# Patient Record
Sex: Female | Born: 1945
Health system: Southern US, Community
[De-identification: ages and names within clinical notes are randomized; demographics above are authoritative.]

## PROBLEM LIST (undated history)

## (undated) DIAGNOSIS — K5790 Diverticulosis of intestine, part unspecified, without perforation or abscess without bleeding: Secondary | ICD-10-CM

## (undated) DIAGNOSIS — E785 Hyperlipidemia, unspecified: Secondary | ICD-10-CM

## (undated) DIAGNOSIS — R0600 Dyspnea, unspecified: Secondary | ICD-10-CM

## (undated) DIAGNOSIS — K219 Gastro-esophageal reflux disease without esophagitis: Secondary | ICD-10-CM

## (undated) DIAGNOSIS — I10 Essential (primary) hypertension: Secondary | ICD-10-CM

## (undated) DIAGNOSIS — T7840XA Allergy, unspecified, initial encounter: Secondary | ICD-10-CM

## (undated) DIAGNOSIS — H269 Unspecified cataract: Secondary | ICD-10-CM

## (undated) DIAGNOSIS — H409 Unspecified glaucoma: Secondary | ICD-10-CM

## (undated) DIAGNOSIS — R7303 Prediabetes: Secondary | ICD-10-CM

## (undated) DIAGNOSIS — M1990 Unspecified osteoarthritis, unspecified site: Secondary | ICD-10-CM

## (undated) HISTORY — DX: Hyperlipidemia, unspecified: E78.5

## (undated) HISTORY — DX: Diverticulosis of intestine, part unspecified, without perforation or abscess without bleeding: K57.90

## (undated) HISTORY — DX: Unspecified cataract: H26.9

## (undated) HISTORY — DX: Unspecified glaucoma: H40.9

## (undated) HISTORY — DX: Allergy, unspecified, initial encounter: T78.40XA

## (undated) HISTORY — DX: Essential (primary) hypertension: I10

## (undated) HISTORY — PX: JOINT REPLACEMENT: SHX530

---

## 1978-04-26 HISTORY — PX: CHOLECYSTECTOMY: SHX55

## 1988-04-26 HISTORY — PX: ABDOMINAL HYSTERECTOMY: SHX81

## 1998-04-26 HISTORY — PX: OTHER SURGICAL HISTORY: SHX169

## 2007-09-02 ENCOUNTER — Encounter: Payer: Self-pay | Admitting: Family Medicine

## 2009-04-03 ENCOUNTER — Ambulatory Visit: Payer: Self-pay | Admitting: Family Medicine

## 2009-04-03 DIAGNOSIS — E785 Hyperlipidemia, unspecified: Secondary | ICD-10-CM

## 2009-04-03 DIAGNOSIS — I10 Essential (primary) hypertension: Secondary | ICD-10-CM

## 2009-04-07 ENCOUNTER — Encounter: Payer: Self-pay | Admitting: Family Medicine

## 2009-04-07 ENCOUNTER — Ambulatory Visit (HOSPITAL_COMMUNITY): Admission: RE | Admit: 2009-04-07 | Discharge: 2009-04-07 | Payer: Self-pay | Admitting: Family Medicine

## 2009-04-14 ENCOUNTER — Ambulatory Visit: Payer: Self-pay | Admitting: Family Medicine

## 2009-05-09 ENCOUNTER — Encounter: Payer: Self-pay | Admitting: Gastroenterology

## 2009-05-14 ENCOUNTER — Encounter: Payer: Self-pay | Admitting: Family Medicine

## 2009-05-14 LAB — CONVERTED CEMR LAB
BUN: 14 mg/dL (ref 6–23)
CO2: 25 meq/L (ref 19–32)
Chloride: 104 meq/L (ref 96–112)
Cholesterol: 275 mg/dL — ABNORMAL HIGH (ref 0–200)
HDL: 111 mg/dL (ref >39)
Potassium: 3.6 meq/L (ref 3.5–5.3)
Sodium: 140 meq/L (ref 135–145)
Triglycerides: 58 mg/dL (ref <150)

## 2009-05-15 ENCOUNTER — Ambulatory Visit: Payer: Self-pay | Admitting: Family Medicine

## 2009-05-19 ENCOUNTER — Telehealth: Payer: Self-pay | Admitting: Family Medicine

## 2009-05-27 DIAGNOSIS — K5790 Diverticulosis of intestine, part unspecified, without perforation or abscess without bleeding: Secondary | ICD-10-CM

## 2009-05-27 HISTORY — DX: Diverticulosis of intestine, part unspecified, without perforation or abscess without bleeding: K57.90

## 2009-05-27 HISTORY — PX: COLONOSCOPY: SHX174

## 2009-06-02 ENCOUNTER — Ambulatory Visit: Payer: Self-pay | Admitting: Gastroenterology

## 2009-06-02 ENCOUNTER — Ambulatory Visit (HOSPITAL_COMMUNITY): Admission: RE | Admit: 2009-06-02 | Discharge: 2009-06-02 | Payer: Self-pay | Admitting: Gastroenterology

## 2009-06-03 ENCOUNTER — Telehealth: Payer: Self-pay | Admitting: Family Medicine

## 2009-06-04 ENCOUNTER — Encounter: Payer: Self-pay | Admitting: Family Medicine

## 2009-06-12 ENCOUNTER — Ambulatory Visit: Payer: Self-pay | Admitting: Family Medicine

## 2009-07-10 ENCOUNTER — Ambulatory Visit: Payer: Self-pay | Admitting: Family Medicine

## 2009-08-20 ENCOUNTER — Ambulatory Visit (HOSPITAL_COMMUNITY): Admission: RE | Admit: 2009-08-20 | Discharge: 2009-08-20 | Payer: Self-pay | Admitting: Family Medicine

## 2010-01-01 ENCOUNTER — Ambulatory Visit: Payer: Self-pay | Admitting: Family Medicine

## 2010-01-01 DIAGNOSIS — R079 Chest pain, unspecified: Secondary | ICD-10-CM

## 2010-05-06 ENCOUNTER — Ambulatory Visit
Admission: RE | Admit: 2010-05-06 | Discharge: 2010-05-06 | Payer: Self-pay | Source: Home / Self Care | Attending: Family Medicine | Admitting: Family Medicine

## 2010-05-06 DIAGNOSIS — M25569 Pain in unspecified knee: Secondary | ICD-10-CM | POA: Insufficient documentation

## 2010-05-11 ENCOUNTER — Encounter
Admission: RE | Admit: 2010-05-11 | Discharge: 2010-05-11 | Payer: Self-pay | Source: Home / Self Care | Attending: Family Medicine | Admitting: Family Medicine

## 2010-05-17 ENCOUNTER — Encounter: Payer: Self-pay | Admitting: Family Medicine

## 2010-05-26 NOTE — Letter (Signed)
Summary: internal other/triage  internal other/triage   Imported By: Cloria Spring LPN 16/01/9603 54:09:81  _____________________________________________________________________  External Attachment:    Type:   Image     Comment:   External Document

## 2010-05-26 NOTE — Medication Information (Signed)
Summary: Tax adviser   Imported By: Lind Guest 06/04/2009 10:25:20  _____________________________________________________________________  External Attachment:    Type:   Image     Comment:   External Document

## 2010-05-26 NOTE — Progress Notes (Signed)
Summary: RESULTS  Phone Note Call from Patient   Summary of Call: WANTS TO KNOW ABOUT HER BONE Achille Rich BACK AT [phone] Initial call taken by: Lind Guest,  May 19, 2009 4:44 PM  Follow-up for Phone Call        patient aware Follow-up by: Worthy Keeler LPN,  May 19, 2009 5:27 PM

## 2010-05-26 NOTE — Assessment & Plan Note (Signed)
Summary: office visit   Vital Signs:  Patient profile:   65 year old female Height:      66 inches Weight:      143.25 pounds BMI:     23.20 O2 Sat:      98 % Pulse rate:   72 / minute Pulse rhythm:   regular Resp:     16 per minute BP sitting:   178 / 96 Cuff size:   regular  Vitals Entered By: Everitt Amber (May 15, 2009 2:35 PM) CC: Follow up chronic problems   Primary Care Provider:  Syliva Overman MD  CC:  Follow up chronic problems.  History of Present Illness: Reports  that she has been  doing fairly well. Denies recent fever or chills. Denies sinus pressure, nasal congestion , ear pain or sore throat. Denies chest congestion, or cough productive of sputum. Denies chest pain, palpitations, PND, orthopnea or leg swelling. Denies abdominal pain, nausea, vomitting, diarrhea or constipation. Denies change in bowel movements or bloody stool. Denies dysuria , frequency, incontinence or hesitancy. Denies  joint pain, swelling, or reduced mobility. Denies headaches, vertigo, seizures. Reports a significant amt of stress caring for her elderly mother, and living displaced [place]. her sleep is poor,listening out for mother.' she is not exercising as she used to, and her diet has changed, all of these things she feels has contributed to her BP being uncontrolled   Denies  rash, lesions, or itch.     Current Medications (verified): 1)  Calcium-Magnesium-Zinc 333-133-8.3 Mg Tabs (Calcium-Magnesium-Zinc) .... Three A Day 2)  Multivitamins  Tabs (Multiple Vitamin) .... 1/2 in The Am and Half At Night 3)  Fish Oil 1400 .... Take 1 Tablet By Mouth Once A Day 4)  Biotin 5000 .... Take 1 Tablet By Mouth Once A Day 5)  Coenzyme Q10 10 Mg Caps (Coenzyme Q10) .... Take 1 Tablet By Mouth Once A Day 6)  B Complex  Tabs (B Complex Vitamins) .... Take 1 Tablet By Mouth Once A Day 7)  Diovan 320 Mg Tabs (Valsartan) .... One Tab By Mouth Qd 8)   Hydrochlorothiazide 12.5 Mg Caps (Hydrochlorothiazide) .... One Cap By Mouth Qd  Allergies (verified): No Known Drug Allergies  Review of Systems      See HPI Eyes:  Denies blurring and discharge. Neuro:  Denies headaches, seizures, and sensation of room spinning. Endo:  Denies cold intolerance, excessive hunger, excessive thirst, excessive urination, heat intolerance, polyuria, and weight change. Heme:  Denies abnormal bruising and bleeding.  Physical Exam  General:  Well-developed,well-nourished,in no acute distress; alert,appropriate and cooperative throughout examination HEENT: No facial asymmetry,  EOMI, No sinus tenderness, TM's Clear, oropharynx  pink and moist.   Chest: Clear to auscultation bilaterally.  CVS: S1, S2, No murmurs, No S3.   Abd: Soft, Nontender.  MS: Adequate ROM spine, hips, shoulders and knees.  Ext: No edema.   CNS: CN 2-12 intact, power tone and sensation normal throughout.   Skin: Intact, no visible lesions or rashes.  Psych: Good eye contact, normal affect.  Memory intact, not anxious or depressed appearing.    Impression & Recommendations:  Problem # 1:  DYSLIPIDEMIA (ICD-272.4) Assessment Comment Only    HDL:111 (05/14/2009)  LDL:152 (05/14/2009)  Chol:275 (05/14/2009)  Trig:58 (05/14/2009), ratio of total to hdl chol is good will d/w card whether pt should be on meds based on absolute values being high  Problem # 2:  HYPERTENSION (ICD-401.9) Assessment: Deteriorated  Her updated medication list for this problem includes:    Diovan Hct 320-25 Mg Tabs (Valsartan-hydrochlorothiazide) .Marland Kitchen... Take 1 tablet by mouth once a day    Hydrochlorothiazide 12.5 Mg Caps (Hydrochlorothiazide) ..... One cap by mouth qd    Amlodipine Besylate 2.5 Mg Tabs (Amlodipine besylate) .Marland Kitchen... Take 1 tablet by mouth once a day alot of time spent discussing stress and lifestylechanges, to help lower bP, pt isds visibly ditressed about her seeming lack of response to  doubling med dose BP today: 178/96 Prior BP: 160/90 (04/14/2009)  Labs Reviewed: K+: 3.6 (05/14/2009) Creat: : 0.76 (05/14/2009)   Chol: 275 (05/14/2009)   HDL: 111 (05/14/2009)   LDL: 152 (05/14/2009)   TG: 58 (05/14/2009)  Complete Medication List: 1)  Calcium-magnesium-zinc 333-133-8.3 Mg Tabs (Calcium-magnesium-zinc) .... Three a day 2)  Multivitamins Tabs (Multiple vitamin) .... 1/2 in the am and half at night 3)  Fish Oil 1400  .... Take 1 tablet by mouth once a day 4)  Biotin 5000  .... Take 1 tablet by mouth once a day 5)  Coenzyme Q10 10 Mg Caps (Coenzyme q10) .... Take 1 tablet by mouth once a day 6)  B Complex Tabs (B complex vitamins) .... Take 1 tablet by mouth once a day 7)  Diovan Hct 320-25 Mg Tabs (Valsartan-hydrochlorothiazide) .... Take 1 tablet by mouth once a day 8)  Hydrochlorothiazide 12.5 Mg Caps (Hydrochlorothiazide) .... One cap by mouth qd 9)  Amlodipine Besylate 2.5 Mg Tabs (Amlodipine besylate) .... Take 1 tablet by mouth once a day  Patient Instructions: 1)  Nurse BP checkx  in 4 weeks. 2)  MD f/u iin 8 weeks. 3)  New dose of med diovan/hCTYZ  320/25 and you will have an additional  med   added amlodipine 2.5 mg one  bedtime.       Prescriptions: AMLODIPINE BESYLATE 2.5 MG TABS (AMLODIPINE BESYLATE) Take 1 tablet by mouth once a day  #30 x 3   Entered by:   Worthy Keeler LPN   Authorized by:   Syliva Overman MD   Signed by:   Syliva Overman MD on 05/26/2009   Method used:   Handwritten   RxID:   [id number] DIOVAN HCT 320-25 MG TABS (VALSARTAN-HYDROCHLOROTHIAZIDE) Take 1 tablet by mouth once a day  #30 x 3   Entered by:   Lind Guest   Authorized by:   Syliva Overman MD   Signed by:   Syliva Overman MD on 05/26/2009   Method used:   Handwritten   RxID:   [id number]

## 2010-05-26 NOTE — Assessment & Plan Note (Signed)
Summary: office visit   Vital Signs:  Patient profile:   65 year old female Menstrual status:  hysterectomy Height:      66 inches Weight:      145.50 pounds BMI:     23.57 O2 Sat:      98 % Pulse rate:   67 / minute Pulse rhythm:   regular Resp:     16 per minute BP sitting:   120 / 80  (left arm) Cuff size:   regular  Vitals Entered By: Everitt Amber LPN (January 01, 2010 1:02 PM) CC: Follow up chronic problems   Primary Care Provider:  Syliva Overman MD  CC:  Follow up chronic problems.  History of Present Illness: Reports  that she has been doing as well as to be expected. She unfortunately unexpectedly recently lost her mother , and is trying to heal emotionally. She wonders if there was "something she missed" to cause her mom's death, I assure her that there is nothing she could have done/should have done differently. Denies recent fever or chills. Denies sinus pressure, nasal congestion , ear pain or sore throat. Denies chest congestion, or cough productive of sputum. Denies chest pain, palpitations, PND, orthopnea or leg swelling. She does have marked hyperlipidemia, she has no symptoms to suggest CAD, her mother was dx with CAD over the age of 14 and lived beyond the age of 26, and died woth bowel ischemia. Denies abdominal pain, nausea, vomitting, diarrhea or constipation. Denies change in bowel movements or bloody stool. Denies dysuria , frequency, incontinence or hesitancy. Acutely developed right groin pain earlier today while running, one advil has providedsignificant relief, she had developed difficulty weight bearing after the acute onset, and had to shorten her exercise routine. Denies headaches, vertigo, seizures. Denies depression, anxiety or insomnia.She is currently grieving for her mom , which I assure her is nl. and she often finds herself crying. Denies  rash, lesions, or itch.     Current Medications (verified): 1)  Calcium-Magnesium-Zinc  333-133-8.3 Mg Tabs (Calcium-Magnesium-Zinc) .... Three A Day 2)  Multivitamins  Tabs (Multiple Vitamin) .... 1/2 in The Am and Half At Night 3)  Fish Oil 1400 .... Take 1 Tablet By Mouth Once A Day 4)  Biotin 5000 .... Take 1 Tablet By Mouth Once A Day 5)  Coenzyme Q10 10 Mg Caps (Coenzyme Q10) .... Take 1 Tablet By Mouth Once A Day 6)  B Complex  Tabs (B Complex Vitamins) .... Take 1 Tablet By Mouth Once A Day 7)  Diovan Hct 320-25 Mg Tabs (Valsartan-Hydrochlorothiazide) .... Take 1 Tablet By Mouth Once A Day 8)  Amlodipine Besylate 2.5 Mg Tabs (Amlodipine Besylate) .... Take 1 Tablet By Mouth Once A Day  Allergies (verified): No Known Drug Allergies  Past History:  Family history reviewed for relevance to current acute and chronic problems.  Family History: Reviewed history from 04/03/2009 and no changes required. Mother-deceased in Sep 30, 2009, age 11 Father-deceased-renal failure Sister-breast cancer, another hTN and DM Brother-Died of lung cancer in 09-30-2008  Review of Systems      See HPI General:  Complains of sleep disorder; difficuty sleeping for a few nights following her Mom's passing. CV:  Complains of chest pain or discomfort; denies difficulty breathing at night, difficulty breathing while lying down, fatigue, lightheadness, near fainting, palpitations, shortness of breath with exertion, swelling of feet, swelling of hands, and weight gain; left cheast pain in the past 1 month under alot of emoptional stress since her Mom  passed ,no other sympts. MS:  right groin pain x 1 day , had to turn back from running/walking she was limping, stretches have , took aleve which has helped a little. Endo:  Denies excessive thirst and excessive urination. Heme:  Denies abnormal bruising and bleeding. Allergy:  Denies hives or rash and itching eyes.  Physical Exam  General:  Well-developed,well-nourished,in no acute distress; alert,appropriate and cooperative throughout examination HEENT: No  facial asymmetry,  EOMI, No sinus tenderness, TM's Clear, oropharynx  pink and moist.   Chest: Clear to auscultation bilaterally.  CVS: S1, S2, No murmurs, No S3.   Abd: Soft, Nontender.  MS: Adequate ROM spine, hips, shoulders and knees.  Ext: No edema.   CNS: CN 2-12 intact, power tone and sensation normal throughout.   Skin: Intact, no visible lesions or rashes.  Psych: Good eye contact, normal affect.  Memory intact, not anxious or depressed appearing.    Impression & Recommendations:  Problem # 1:  CHEST PAIN UNSPECIFIED (ICD-786.50) Assessment Comment Only  Orders: EKG w/ Interpretation (93000)nSR, no evidence of ischemia, pt reassured  Problem # 2:  DYSLIPIDEMIA (ICD-272.4) Assessment: Comment Only  Orders: T-Lipid Profile (88416-60630) T-Hepatic Function (16010-93235)    HDL:111 (05/14/2009)  LDL:152 (05/14/2009)  Chol:275 (05/14/2009)  Trig:58 (05/14/2009) Low fat diet discussed and encouraged, and literature also given  Problem # 3:  HYPERTENSION (ICD-401.9) Assessment: Improved  Her updated medication list for this problem includes:    Diovan Hct 320-25 Mg Tabs (Valsartan-hydrochlorothiazide) .Marland Kitchen... Take 1 tablet by mouth once a day    Amlodipine Besylate 2.5 Mg Tabs (Amlodipine besylate) .Marland Kitchen... Take 1 tablet by mouth once a day Check your Blood Pressure regularly. If it is above150/95  you should make an appointment.  Orders: EKG w/ Interpretation (93000) T-Basic Metabolic Panel (57322-02542)  BP today: 120/80 Prior BP: 134/84 (07/10/2009)  Labs Reviewed: K+: 3.6 (05/14/2009) Creat: : 0.76 (05/14/2009)   Chol: 275 (05/14/2009)   HDL: 111 (05/14/2009)   LDL: 152 (05/14/2009)   TG: 58 (05/14/2009)  Complete Medication List: 1)  Calcium-magnesium-zinc 333-133-8.3 Mg Tabs (Calcium-magnesium-zinc) .... Three a day 2)  Multivitamins Tabs (Multiple vitamin) .... 1/2 in the am and half at night 3)  Fish Oil 1400  .... Take 1 tablet by mouth once a day 4)   Biotin 5000  .... Take 1 tablet by mouth once a day 5)  Coenzyme Q10 10 Mg Caps (Coenzyme q10) .... Take 1 tablet by mouth once a day 6)  B Complex Tabs (B complex vitamins) .... Take 1 tablet by mouth once a day 7)  Diovan Hct 320-25 Mg Tabs (Valsartan-hydrochlorothiazide) .... Take 1 tablet by mouth once a day 8)  Amlodipine Besylate 2.5 Mg Tabs (Amlodipine besylate) .... Take 1 tablet by mouth once a day  Other Orders: T-CBC w/Diff ([id number]) T-TSH [phone]) Influenza Vaccine NON MCR (71062)  Patient Instructions: 1)  Please schedule a follow-up appointment in 4 months. 2)  It is important that you exercise regularly at least 30 minutes 5 times a week. If you develop chest pain, have severe difficulty breathing, or feel very tired , stop exercising immediately and seek medical attention. 3)  BMP prior to visit, ICD-9: 4)  Hepatic Panel prior to visit, ICD-9: 5)  Lipid Panel prior to visit, ICD-9: 6)  TSH prior to visit, ICD-9:   fasting  labs asap 7)  CBC w/ Diff prior to visit, ICD-9: Prescriptions: AMLODIPINE BESYLATE 2.5 MG TABS (AMLODIPINE BESYLATE) Take 1 tablet by mouth once  a day  #90 x 1   Entered by:   Adella Hare LPN   Authorized by:   Syliva Overman MD   Signed by:   Adella Hare LPN on 04/54/0981   Method used:   Electronically to        [facility] [phone]* (retail)       [address]       [place], [place]  [address]       Ph: [phone] or [phone]       Fax: [phone]   RxID:   [id number] DIOVAN HCT 320-25 MG TABS (VALSARTAN-HYDROCHLOROTHIAZIDE) Take 1 tablet by mouth once a day  #90 x 1   Entered by:   Adella Hare LPN   Authorized by:   Syliva Overman MD   Signed by:   Adella Hare LPN on 63/87/5643   Method used:   Faxed to ...       [facility] (mail-order)             , [place]         Ph: [phone]       Fax: [phone]   RxID:   [id number]    Immunizations Administered:  Influenza Vaccine # 1:     Vaccine Type: Fluvax Non-MCR    Site: left deltoid    Mfr: novartis    Dose: 0.5 ml    Route: IM    Given by: Adella Hare LPN    Exp. Date: 08/2010    Lot #: [id number]    VIS given: 11/18/09 version given January 01, 2010.

## 2010-05-26 NOTE — Assessment & Plan Note (Signed)
Summary: office visit   Vital Signs:  Patient profile:   65 year old female Menstrual status:  hysterectomy Height:      66 inches Weight:      147 pounds BMI:     23.81 O2 Sat:      97 % Pulse rate:   67 / minute Pulse rhythm:   regular Resp:     16 per minute BP sitting:   134 / 84  (left arm) Cuff size:   regular  Vitals Entered By: Everitt Amber LPN (July 10, 2009 1:12 PM) CC: has been having a ringing in her left ear and it sounds like a pulsating noise in there also.      Menstrual Status hysterectomy   Primary Care Provider:  Syliva Overman MD  CC:  has been having a ringing in her left ear and it sounds like a pulsating noise in there also. Marland Kitchen  History of Present Illness: Reports  that tshe has been doing much better. She is more adjusted to her new living environment and has found a way to exercise at least 3 times weekly. Denies recent fever or chills. Denies sinus pressure, nasal congestion , ear pain or sore throat. Denies chest congestion, or cough productive of sputum. Denies chest pain, palpitations, PND, orthopnea or leg swelling. Denies abdominal pain, nausea, vomitting, diarrhea or constipation. Denies change in bowel movements or bloody stool. Denies dysuria , frequency, incontinence or hesitancy. Denies  joint pain, swelling, or reduced mobility. Denies headaches, vertigo, seizures.she reports hearingher heart beating in her ear and is concerned about this Denies depression, anxiety or insomnia. Denies  rash, lesions, or itch.     Current Medications (verified): 1)  Calcium-Magnesium-Zinc 333-133-8.3 Mg Tabs (Calcium-Magnesium-Zinc) .... Three A Day 2)  Multivitamins  Tabs (Multiple Vitamin) .... 1/2 in The Am and Half At Night 3)  Fish Oil 1400 .... Take 1 Tablet By Mouth Once A Day 4)  Biotin 5000 .... Take 1 Tablet By Mouth Once A Day 5)  Coenzyme Q10 10 Mg Caps (Coenzyme Q10) .... Take 1 Tablet By Mouth Once A Day 6)  B Complex  Tabs (B  Complex Vitamins) .... Take 1 Tablet By Mouth Once A Day 7)  Diovan Hct 320-25 Mg Tabs (Valsartan-Hydrochlorothiazide) .... Take 1 Tablet By Mouth Once A Day 8)  Amlodipine Besylate 2.5 Mg Tabs (Amlodipine Besylate) .... Take 1 Tablet By Mouth Once A Day  Allergies (verified): No Known Drug Allergies  Review of Systems      See HPI Eyes:  Denies blurring, discharge, eye pain, and red eye. Endo:  Denies cold intolerance, excessive hunger, excessive thirst, excessive urination, heat intolerance, polyuria, and weight change. Heme:  Denies abnormal bruising and bleeding. Allergy:  Complains of seasonal allergies.  Physical Exam  General:  Well-developed,well-nourished,in no acute distress; alert,appropriate and cooperative throughout examination HEENT: No facial asymmetry, carotid bruit EOMI, No sinus tenderness, TM's Clear, oropharynx  pink and moist.   Chest: Clear to auscultation bilaterally.  CVS: S1, S2, No murmurs, No S3.   Abd: Soft, Nontender.  MS: Adequate ROM spine, hips, shoulders and knees.  Ext: No edema.   CNS: CN 2-12 intact, power tone and sensation normal throughout.   Skin: Intact, no visible lesions or rashes.  Psych: Good eye contact, normal affect.  Memory intact, not anxious or depressed appearing.    Impression & Recommendations:  Problem # 1:  CAROTID BRUIT (ICD-785.9) Assessment Comment Only  Orders: Radiology Referral (Radiology)  Problem #  2:  HYPERTENSION (ICD-401.9) Assessment: Improved  Her updated medication list for this problem includes:    Diovan Hct 320-25 Mg Tabs (Valsartan-hydrochlorothiazide) .Marland Kitchen... Take 1 tablet by mouth once a day    Amlodipine Besylate 2.5 Mg Tabs (Amlodipine besylate) .Marland Kitchen... Take 1 tablet by mouth once a day  BP today: 134/84 Prior BP: 148/92 (06/12/2009)  Labs Reviewed: K+: 3.6 (05/14/2009) Creat: : 0.76 (05/14/2009)   Chol: 275 (05/14/2009)   HDL: 111 (05/14/2009)   LDL: 152 (05/14/2009)   TG: 58  (05/14/2009)  Problem # 3:  DYSLIPIDEMIA (ICD-272.4) Assessment: Comment Only  Orders: Radiology Referral (Radiology) T-Lipid Profile [id number])    HDL:111 (05/14/2009)  LDL:152 (05/14/2009)  Chol:275 (05/14/2009)  Trig:58 (05/14/2009)will solicit card opinion on the need for treament  Complete Medication List: 1)  Calcium-magnesium-zinc 333-133-8.3 Mg Tabs (Calcium-magnesium-zinc) .... Three a day 2)  Multivitamins Tabs (Multiple vitamin) .... 1/2 in the am and half at night 3)  Fish Oil 1400  .... Take 1 tablet by mouth once a day 4)  Biotin 5000  .... Take 1 tablet by mouth once a day 5)  Coenzyme Q10 10 Mg Caps (Coenzyme q10) .... Take 1 tablet by mouth once a day 6)  B Complex Tabs (B complex vitamins) .... Take 1 tablet by mouth once a day 7)  Diovan Hct 320-25 Mg Tabs (Valsartan-hydrochlorothiazide) .... Take 1 tablet by mouth once a day 8)  Amlodipine Besylate 2.5 Mg Tabs (Amlodipine besylate) .... Take 1 tablet by mouth once a day  Patient Instructions: 1)  F/U in 5.5 months 2)  Your blood pressure is excellent , pls continue your current meds. 3)  You will be referred for a carotid doppler study. Prescriptions: AMLODIPINE BESYLATE 2.5 MG TABS (AMLODIPINE BESYLATE) Take 1 tablet by mouth once a day  #90 x 3   Entered and Authorized by:   Syliva Overman MD   Signed by:   Syliva Overman MD on 07/27/2009   Method used:   Handwritten   RxID:   [id number]

## 2010-05-26 NOTE — Assessment & Plan Note (Signed)
Summary: bp check  Nurse Visit   Vital Signs:  Patient profile:   65 year old female Height:      66 inches Weight:      146.50 pounds BMI:     23.73 O2 Sat:      98 % Pulse rate:   68 / minute Resp:     16 per minute BP sitting:   148 / 92 Cuff size:   regular  Vitals Entered By: Everitt Amber LPN (June 16, 2009 8:24 AM) Comments No med changes, MD f/u in 3 weeks   Allergies: No Known Drug Allergies  Orders Added: 1)  No Charge Patient Arrived (NCPA0) [NCPA0]

## 2010-05-26 NOTE — Progress Notes (Signed)
Summary: 90 DAY SUPPLY RX  Phone Note Call from Patient   Summary of Call: ON HER DIOVAN SHE NEEDS A RX WRITTEN OUT FOR 90 DAY SUPPLY FOR HER MAIL ORDER CALL BACK AT [phone] AND SHE WILL PICK UP Initial call taken by: Lind Guest,  June 03, 2009 1:06 PM  Follow-up for Phone Call        script written, pls let her know Follow-up by: Syliva Overman MD,  June 03, 2009 4:43 PM  Additional Follow-up for Phone Call Additional follow up Details #1::        PATIENT AWARE Additional Follow-up by: Lind Guest,  June 04, 2009 8:22 AM

## 2010-05-27 ENCOUNTER — Encounter: Payer: Self-pay | Admitting: Family Medicine

## 2010-05-28 NOTE — Assessment & Plan Note (Signed)
Summary: office visit   Vital Signs:  Patient profile:   65 year old female Menstrual status:  hysterectomy Height:      66 inches Weight:      148.75 pounds BMI:     24.10 O2 Sat:      99 % Pulse rate:   78 / minute Pulse rhythm:   regular Resp:     16 per minute BP sitting:   120 / 82  (left arm) Cuff size:   regular  Vitals Entered By: Everitt Amber LPN (May 06, 2010 1:03 PM) CC: Follow up chronic problems, has a cold with slight yellowish light nasal drainage, coughing up some phlegm also with slight yellowish color and also she was dizzy yesterday with a queasy stomach   Primary Care Provider:  Syliva Overman MD  CC:  Follow up chronic problems, has a cold with slight yellowish light nasal drainage, and coughing up some phlegm also with slight yellowish color and also she was dizzy yesterday with a queasy stomach.  History of Present Illness: 2 week h/o head and chest congestion , getting slightly better, but still a deep cough, sometimes sputum is yellow, no current fever or chills. Recently h. reports mild nausea yesterday, denies diarreah or vomitting, alot of her fam,ily members have been sick.  Current Medications (verified): 1)  Calcium-Magnesium-Zinc 333-133-8.3 Mg Tabs (Calcium-Magnesium-Zinc) .... Three A Day 2)  Multivitamins  Tabs (Multiple Vitamin) .... 1/2 in The Am and Half At Night 3)  Fish Oil 1200mg  .... One Tab Three Times A Day 4)  Biotin 5000 .... Take 1 Tablet By Mouth Once A Day 5)  Coenzyme Q10 10 Mg Caps (Coenzyme Q10) .... Take 1 Tablet By Mouth Once A Day 6)  B Complex  Tabs (B Complex Vitamins) .... Take 1 Tablet By Mouth Once A Day 7)  Diovan Hct 320-25 Mg Tabs (Valsartan-Hydrochlorothiazide) .... Take 1 Tablet By Mouth Once A Day 8)  Amlodipine Besylate 2.5 Mg Tabs (Amlodipine Besylate) .... Take 1 Tablet By Mouth Once A Day 9)  Potassium 99 Mg Tabs (Potassium) .... 2 Tabs Daily 10)  Cod Liver Oil  Caps (Cod Liver Oil) .... 2 Tabs  Daily 11)  Flexin .Marland Kitchen.. 4 Tabs Daily For Joint Pain 12)  L-Lysine 1000 Mg Tabs (Lysine) .... 2 Tabs Daily 13)  Dong Quai 500 Mg Caps (Dong Quai (Angelica Sinensis)) .Marland Kitchen.. 1 Tab Daily  Allergies (verified): No Known Drug Allergies  Review of Systems      See HPI General:  Complains of chills, malaise, and sweats. Eyes:  Denies discharge, eye pain, and red eye. CV:  Denies chest pain or discomfort, palpitations, and swelling of feet. GU:  Denies dysuria and urinary frequency. MS:  Complains of joint pain and stiffness. Derm:  Denies itching and rash. Neuro:  Complains of sensation of room spinning; denies headaches; mild vertigo  x 1 day. Psych:  Denies anxiety and depression. Endo:  Denies cold intolerance, excessive hunger, excessive thirst, and excessive urination. Heme:  Denies abnormal bruising and bleeding. Allergy:  Denies hives or rash and itching eyes.  Physical Exam  General:  Well-developed,well-nourished,in no acute distress; alert,appropriate and cooperative throughout examination HEENT: No facial asymmetry,  EOMI,maxillary and fromtal sinus tenderness, TM's Clear, oropharynx  pink and moist. Bilat ant cervical adenitis  Chest: decreased air entry, bilateral crackles and few wheezes.  CVS: S1, S2, No murmurs, No S3.   Abd: Soft, Nontender.  MS: Adequate ROM spine, hips, shoulders and knees.  Ext: No edema.   CNS: CN 2-12 intact, power tone and sensation normal throughout.   Skin: Intact, no visible lesions or rashes.  Psych: Good eye contact, normal affect.  Memory intact, not anxious or depressed appearing.    Impression & Recommendations:  Problem # 1:  KNEE PAIN, LEFT, CHRONIC (ICD-719.46) Assessment Deteriorated  Orders: Orthopedic Referral (Ortho)  Problem # 2:  ACUTE BRONCHITIS (ICD-466.0) Assessment: Comment Only  Her updated medication list for this problem includes:    Doxycycline Hyclate 100 Mg Caps (Doxycycline hyclate) .Marland Kitchen... Take 1 capsule by  mouth two times a day    Tessalon Perles 100 Mg Caps (Benzonatate) .Marland Kitchen... Take 1 capsule by mouth three times a day  Problem # 3:  ACUTE SINUSITIS, UNSPECIFIED (ICD-461.9) Assessment: Comment Only  Her updated medication list for this problem includes:    Doxycycline Hyclate 100 Mg Caps (Doxycycline hyclate) .Marland Kitchen... Take 1 capsule by mouth two times a day    Tessalon Perles 100 Mg Caps (Benzonatate) .Marland Kitchen... Take 1 capsule by mouth three times a day  Problem # 4:  HYPERTENSION (ICD-401.9) Assessment: Unchanged  Her updated medication list for this problem includes:    Diovan Hct 320-25 Mg Tabs (Valsartan-hydrochlorothiazide) .Marland Kitchen... Take 1 tablet by mouth once a day    Amlodipine Besylate 2.5 Mg Tabs (Amlodipine besylate) .Marland Kitchen... Take 1 tablet by mouth once a day  Orders: T-Basic Metabolic Panel [id number])  BP today: 120/82 Prior BP: 120/80 (01/01/2010)  Labs Reviewed: K+: 3.6 (05/14/2009) Creat: : 0.76 (05/14/2009)   Chol: 275 (05/14/2009)   HDL: 111 (05/14/2009)   LDL: 152 (05/14/2009)   TG: 58 (05/14/2009)  Problem # 5:  DYSLIPIDEMIA (ICD-272.4) Assessment: Comment Only  Orders: T-Lipid Profile ([id number]) T-Hepatic Function [phone]) Low fat dietdiscussed and encouraged  Complete Medication List: 1)  Calcium-magnesium-zinc 333-133-8.3 Mg Tabs (Calcium-magnesium-zinc) .... Three a day 2)  Multivitamins Tabs (Multiple vitamin) .... 1/2 in the am and half at night 3)  Fish Oil 1200mg   .... One tab three times a day 4)  Biotin 5000  .... Take 1 tablet by mouth once a day 5)  Coenzyme Q10 10 Mg Caps (Coenzyme q10) .... Take 1 tablet by mouth once a day 6)  B Complex Tabs (B complex vitamins) .... Take 1 tablet by mouth once a day 7)  Diovan Hct 320-25 Mg Tabs (Valsartan-hydrochlorothiazide) .... Take 1 tablet by mouth once a day 8)  Amlodipine Besylate 2.5 Mg Tabs (Amlodipine besylate) .... Take 1 tablet by mouth once a day 9)  Potassium 99 Mg Tabs (Potassium) .... 2 tabs  daily 10)  Cod Liver Oil Caps (Cod liver oil) .... 2 tabs daily 11)  Flexin  .Marland Kitchen.. 4 tabs daily for joint pain 12)  L-lysine 1000 Mg Tabs (Lysine) .... 2 tabs daily 13)  Dong Quai 500 Mg Caps (Dong quai (angelica sinensis)) .Marland Kitchen.. 1 tab daily 14)  Doxycycline Hyclate 100 Mg Caps (Doxycycline hyclate) .... Take 1 capsule by mouth two times a day 15)  Tessalon Perles 100 Mg Caps (Benzonatate) .... Take 1 capsule by mouth three times a day 16)  Fluconazole 150 Mg Tabs (Fluconazole) .... Take 1 tablet by mouth once a day'as needed for vaginal itching  Other Orders: T-CBC w/Diff ([id number]) T-TSH ([id number]) Radiology Referral (Radiology)  Patient Instructions: 1)  Please schedule a follow-up appointment in 3.5 months. 2)  You are being treated for sinusitis and bronchitis..You also had acute vertigo yesterday 3)  . 4)  BMP prior to visit, ICD-9: 5)  Hepatic Panel prior to visit, ICD-9: 6)  Lipid Panel prior to visit, ICD-9: 7)  TSH prior to visit, ICD-9:  fasting  asap 8)  CBC w/ Diff prior to visit, ICD-9: 9)  It is important that you exercise regularly at least320 minutes 5 times a week. If you develop chest pain, have severe difficulty breathing, or feel very tired , stop exercising immediately and seek medical attention. 10)  You need to lose weight. Consider a lower calorie diet and regular exercise.  Prescriptions: FLUCONAZOLE 150 MG TABS (FLUCONAZOLE) Take 1 tablet by mouth once a day'as needed for vaginal itching  #3 x 0   Entered and Authorized by:   Syliva Overman MD   Signed by:   Syliva Overman MD on 05/06/2010   Method used:   Electronically to        [facility] [phone]* (retail)       [address]       [place], [place]  [address]       Ph: [phone] or [phone]       Fax: [phone]   RxID:   [id number] TESSALON PERLES 100 MG CAPS (BENZONATATE) Take 1 capsule by mouth three times a day  #30 x 0   Entered and Authorized by:    Syliva Overman MD   Signed by:   Syliva Overman MD on 05/06/2010   Method used:   Electronically to        [facility] [phone]* (retail)       [address]       [place], [place]  [address]       Ph: [phone] or [phone]       Fax: [phone]   RxID:   [id number] DOXYCYCLINE HYCLATE 100 MG CAPS (DOXYCYCLINE HYCLATE) Take 1 capsule by mouth two times a day  #20 x 0   Entered and Authorized by:   Syliva Overman MD   Signed by:   Syliva Overman MD on 05/06/2010   Method used:   Electronically to        [facility] [phone]* (retail)       [address]       [place], [place]  [address]       Ph: [phone] or [phone]       Fax: [phone]   RxID:   [id number]    Orders Added: 1)  Est. Patient Level IV [78938] 2)  T-Basic Metabolic Panel [id number] 3)  T-Lipid Profile [[id number]] 4)  T-Hepatic Function [80076-22960] 5)  T-CBC w/Diff [[id number]] 6)  T-TSH [[id number]] 7)  Orthopedic Referral [Ortho] 8)  Radiology Referral [Radiology]

## 2010-07-22 ENCOUNTER — Other Ambulatory Visit: Payer: Self-pay | Admitting: Family Medicine

## 2010-08-03 ENCOUNTER — Other Ambulatory Visit: Payer: Self-pay | Admitting: Family Medicine

## 2010-08-03 ENCOUNTER — Encounter: Payer: Self-pay | Admitting: Family Medicine

## 2010-08-03 LAB — CBC WITH DIFFERENTIAL/PLATELET
Basophils Absolute: 0 10*3/uL (ref 0.0–0.1)
Basophils Relative: 0 % (ref 0–1)
Eosinophils Absolute: 0.1 10*3/uL (ref 0.0–0.7)
Eosinophils Relative: 2 % (ref 0–5)
HCT: 37.5 % (ref 36.0–46.0)
MCHC: 32.5 g/dL (ref 30.0–36.0)
MCV: 91 fL (ref 78.0–100.0)
Monocytes Absolute: 0.4 10*3/uL (ref 0.1–1.0)
Platelets: 247 10*3/uL (ref 150–400)
RDW: 13.4 % (ref 11.5–15.5)

## 2010-08-03 LAB — HEPATIC FUNCTION PANEL
AST: 25 U/L (ref 0–37)
Alkaline Phosphatase: 75 U/L (ref 39–117)
Indirect Bilirubin: 0.3 mg/dL (ref 0.0–0.9)
Total Protein: 6.6 g/dL (ref 6.0–8.3)

## 2010-08-03 LAB — BASIC METABOLIC PANEL
BUN: 13 mg/dL (ref 6–23)
Calcium: 10.1 mg/dL (ref 8.4–10.5)
Creat: 0.72 mg/dL (ref 0.40–1.20)

## 2010-08-03 LAB — LIPID PANEL
Cholesterol: 251 mg/dL — ABNORMAL HIGH (ref 0–200)
LDL Cholesterol: 121 mg/dL — ABNORMAL HIGH (ref 0–99)
Triglycerides: 55 mg/dL (ref <150)

## 2010-08-04 ENCOUNTER — Encounter: Payer: Self-pay | Admitting: Family Medicine

## 2010-08-05 ENCOUNTER — Ambulatory Visit (INDEPENDENT_AMBULATORY_CARE_PROVIDER_SITE_OTHER): Payer: Medicare HMO | Admitting: Family Medicine

## 2010-08-05 ENCOUNTER — Encounter: Payer: Self-pay | Admitting: Family Medicine

## 2010-08-05 VITALS — BP 122/80 | HR 63 | Resp 16 | Ht 66.0 in | Wt 153.4 lb

## 2010-08-05 DIAGNOSIS — E785 Hyperlipidemia, unspecified: Secondary | ICD-10-CM

## 2010-08-05 DIAGNOSIS — M25562 Pain in left knee: Secondary | ICD-10-CM

## 2010-08-05 DIAGNOSIS — M25569 Pain in unspecified knee: Secondary | ICD-10-CM

## 2010-08-05 DIAGNOSIS — I10 Essential (primary) hypertension: Secondary | ICD-10-CM

## 2010-08-05 MED ORDER — AMLODIPINE BESYLATE 2.5 MG PO TABS: 2.5000 mg | ORAL_TABLET | Freq: Every day | ORAL | Status: DC

## 2010-08-05 MED ORDER — VALSARTAN-HYDROCHLOROTHIAZIDE 320-25 MG PO TABS: 1.0000 | ORAL_TABLET | Freq: Every day | ORAL | Status: DC

## 2010-08-05 NOTE — Progress Notes (Signed)
  Subjective:    Patient ID: Adrienne [name], female    DOB: [birth year], 65 y.o.   MRN: [id number]  HPI  2 day h/o vomitting and diareah 2 weeks ago. Increased left knee pain and swelling.Wants to see ortho about this. Otherwise she has no new concerns or complaints. Recent labs are reviewed at visit, her immunization and screening tests are up to date. She has been better able to focus on healthy eating with great results, her exercise is limited by joint problems  Review of Systems Denies recent fever or chills. Denies sinus pressure, nasal congestion, ear pain or sore throat. Denies chest congestion, productive cough or wheezing. Denies chest pains, palpitations, paroxysmal nocturnal dyspnea, orthopnea and leg swelling Denies abdominal pain, nausea, vomiting,diarrhea or constipation.  Denies rectal bleeding or change in bowel movement. Denies dysuria, frequency, hesitancy or incontinence.  Denies headaches, seizure, numbness, or tingling. Denies depression, anxiety or insomnia. Denies skin break down or rash.        Objective:   Physical Exam Patient alert and oriented and in no Cardiopulmonary distress.  HEENT: No facial asymmetry, EOMI, no sinus tenderness, TM's clear, Oropharynx pink and moist.  Neck supple no adenopathy.  Chest: Clear to auscultation bilaterally.  CVS: S1, S2 no murmurs, no S3.  ABD: Soft non tender. Bowel sounds normal.  Ext: No edema  MS: Adequate ROM spine, shoulders, hips and reduced in  knee.  Skin: Intact, no ulcerations or rash noted.  Psych: Good eye contact, normal affect. Memory intact not anxious or depressed appearing.  CNS: CN 2-12 intact, power, tone and sensation normal throughout.        Assessment & Plan:

## 2010-08-05 NOTE — Patient Instructions (Signed)
F/U in 6 months.  Congrats on improved cholesterol, keep it up.  Fasting chem 7 and lipid in 6 months. You will be referred to orthopedics and for an eye exam  No med changes at this time

## 2010-08-07 ENCOUNTER — Other Ambulatory Visit: Payer: Self-pay | Admitting: Family Medicine

## 2010-08-24 ENCOUNTER — Encounter: Payer: Self-pay | Admitting: Family Medicine

## 2010-08-24 NOTE — Assessment & Plan Note (Signed)
Deteriorated, orthopedic evaluation

## 2010-08-24 NOTE — Assessment & Plan Note (Signed)
Improved, pta pplauded on this and encouraged to continue dietary modification

## 2010-08-24 NOTE — Assessment & Plan Note (Signed)
Controlled, no change in medication  

## 2010-09-01 ENCOUNTER — Ambulatory Visit (HOSPITAL_COMMUNITY)
Admission: RE | Admit: 2010-09-01 | Discharge: 2010-09-01 | Disposition: A | Discharge status: Home / Self Care | Payer: Managed Care, Other (non HMO) | Source: Ambulatory Visit | Attending: Physical Therapy | Admitting: Physical Therapy

## 2010-09-01 DIAGNOSIS — R262 Difficulty in walking, not elsewhere classified: Secondary | ICD-10-CM | POA: Insufficient documentation

## 2010-09-01 DIAGNOSIS — M25669 Stiffness of unspecified knee, not elsewhere classified: Secondary | ICD-10-CM | POA: Insufficient documentation

## 2010-09-01 DIAGNOSIS — M25569 Pain in unspecified knee: Secondary | ICD-10-CM | POA: Insufficient documentation

## 2010-09-01 DIAGNOSIS — I10 Essential (primary) hypertension: Secondary | ICD-10-CM | POA: Insufficient documentation

## 2010-09-01 DIAGNOSIS — IMO0001 Reserved for inherently not codable concepts without codable children: Secondary | ICD-10-CM | POA: Insufficient documentation

## 2010-09-01 DIAGNOSIS — M6281 Muscle weakness (generalized): Secondary | ICD-10-CM | POA: Insufficient documentation

## 2010-09-09 ENCOUNTER — Ambulatory Visit (HOSPITAL_COMMUNITY)
Admission: RE | Admit: 2010-09-09 | Discharge: 2010-09-09 | Disposition: A | Discharge status: Home / Self Care | Payer: Managed Care, Other (non HMO) | Source: Ambulatory Visit | Attending: Family Medicine | Admitting: Family Medicine

## 2010-09-10 ENCOUNTER — Ambulatory Visit (HOSPITAL_COMMUNITY)
Admission: RE | Admit: 2010-09-10 | Discharge: 2010-09-10 | Disposition: A | Discharge status: Home / Self Care | Payer: Managed Care, Other (non HMO) | Source: Ambulatory Visit | Attending: Family Medicine | Admitting: Family Medicine

## 2010-09-14 ENCOUNTER — Ambulatory Visit (HOSPITAL_COMMUNITY)
Admission: RE | Admit: 2010-09-14 | Discharge: 2010-09-14 | Disposition: A | Discharge status: Home / Self Care | Payer: Managed Care, Other (non HMO) | Source: Ambulatory Visit | Attending: Family Medicine | Admitting: Family Medicine

## 2010-09-17 ENCOUNTER — Ambulatory Visit (HOSPITAL_COMMUNITY)
Admission: RE | Admit: 2010-09-17 | Discharge: 2010-09-17 | Disposition: A | Discharge status: Home / Self Care | Payer: Managed Care, Other (non HMO) | Source: Ambulatory Visit | Attending: Family Medicine | Admitting: Family Medicine

## 2010-09-22 ENCOUNTER — Ambulatory Visit (HOSPITAL_COMMUNITY)
Admission: RE | Admit: 2010-09-22 | Discharge: 2010-09-22 | Disposition: A | Discharge status: Home / Self Care | Payer: Managed Care, Other (non HMO) | Source: Ambulatory Visit | Attending: Family Medicine | Admitting: Family Medicine

## 2010-09-24 ENCOUNTER — Ambulatory Visit (HOSPITAL_COMMUNITY): Payer: Managed Care, Other (non HMO) | Admitting: Physical Therapy

## 2011-02-02 ENCOUNTER — Encounter: Payer: Self-pay | Admitting: Family Medicine

## 2011-02-04 ENCOUNTER — Ambulatory Visit (INDEPENDENT_AMBULATORY_CARE_PROVIDER_SITE_OTHER): Payer: Managed Care, Other (non HMO) | Admitting: Family Medicine

## 2011-02-04 ENCOUNTER — Encounter: Payer: Self-pay | Admitting: Family Medicine

## 2011-02-04 VITALS — BP 120/70 | HR 63 | Resp 16 | Ht 66.0 in | Wt 153.8 lb

## 2011-02-04 DIAGNOSIS — M25569 Pain in unspecified knee: Secondary | ICD-10-CM

## 2011-02-04 DIAGNOSIS — R5381 Other malaise: Secondary | ICD-10-CM

## 2011-02-04 DIAGNOSIS — Z23 Encounter for immunization: Secondary | ICD-10-CM

## 2011-02-04 DIAGNOSIS — I10 Essential (primary) hypertension: Secondary | ICD-10-CM

## 2011-02-04 DIAGNOSIS — Z2911 Encounter for prophylactic immunotherapy for respiratory syncytial virus (RSV): Secondary | ICD-10-CM

## 2011-02-04 DIAGNOSIS — E785 Hyperlipidemia, unspecified: Secondary | ICD-10-CM

## 2011-02-04 LAB — LIPID PANEL
Cholesterol: 254 mg/dL — ABNORMAL HIGH (ref 0–200)
LDL Cholesterol: 128 mg/dL — ABNORMAL HIGH (ref 0–99)
VLDL: 11 mg/dL (ref 0–40)

## 2011-02-04 LAB — BASIC METABOLIC PANEL
BUN: 16 mg/dL (ref 6–23)
Creat: 0.72 mg/dL (ref 0.50–1.10)
Glucose, Bld: 80 mg/dL (ref 70–99)
Potassium: 4.1 mEq/L (ref 3.5–5.3)

## 2011-02-04 MED ORDER — VALSARTAN-HYDROCHLOROTHIAZIDE 320-25 MG PO TABS: 1.0000 | ORAL_TABLET | Freq: Every day | ORAL | Status: DC

## 2011-02-04 MED ORDER — AMLODIPINE BESYLATE 2.5 MG PO TABS: 2.5000 mg | ORAL_TABLET | Freq: Every day | ORAL | Status: DC

## 2011-02-04 NOTE — Patient Instructions (Addendum)
F/u in 6 months.  It is important that you exercise regularly at least 30 minutes 5 times a week. If you develop chest pain, have severe difficulty breathing, or feel very tired, stop exercising immediately and seek medical attention   A healthy diet is rich in fruit, vegetables and whole grains. Poultry fish, nuts and beans are a healthy choice for protein rather then red meat. A low sodium diet and drinking 64 ounces of water daily is generally recommended. Oils and sweet should be limited. Carbohydrates especially for those who are diabetic or overweight, should be limited to 30-45 gram per meal. It is important to eat on a regular schedule, at least 3 times daily. Snacks should be primarily fruits, vegetables or nuts.  Fasting labs end April CBC and differential, chem 7, lipid, tsh   Shingles vaccine and Flu vaccine today  Mammogram is due in January, we will schedule

## 2011-02-05 NOTE — Assessment & Plan Note (Signed)
Improved, doing physical therapy at home

## 2011-02-05 NOTE — Progress Notes (Signed)
  Subjective:    Patient ID: Adrienne [name], female    DOB: 1945/05/19, 65 y.o.   MRN: [id number]  HPI The PT is here for follow up and re-evaluation of chronic medical conditions, medication management and review of any available recent lab and radiology data.  Preventive health is updated, specifically  Cancer screening and Immunization.   Questions or concerns regarding consultations or procedures which the PT has had in the interim are  Addressed.She has had 2 visits with orthopedics re arthritis in the right knee, an MRI was done and the possibility of operative procedure is raised  The PT denies any adverse reactions to current medications since the last visit.  There are no new concerns.  There are no specific complaints       Review of Systems See HPI Denies recent fever or chills. Denies sinus pressure, nasal congestion, ear pain or sore throat. Denies chest congestion, productive cough or wheezing. Denies chest pains, palpitations and leg swelling Denies abdominal pain, nausea, vomiting,diarrhea or constipation.   Denies dysuria, frequency, hesitancy or incontinence. Denies joint pain, swelling and limitation in mobility. Denies headaches, seizures, numbness, or tingling. Denies depression, anxiety or insomnia. Denies skin break down or rash.        Objective:   Physical Exam Patient alert and oriented and in no cardiopulmonary distress.  HEENT: No facial asymmetry, EOMI, no sinus tenderness,  oropharynx pink and moist.  Neck supple no adenopathy.  Chest: Clear to auscultation bilaterally.  CVS: S1, S2 no murmurs, no S3.  ABD: Soft non tender. Bowel sounds normal.  Ext: No edema  MS: Adequate ROM spine, shoulders, hips and knees.  Skin: Intact, no ulcerations or rash noted.  Psych: Good eye contact, normal affect. Memory intact not anxious or depressed appearing.  CNS: CN 2-12 intact, power, tone and sensation normal throughout.        Assessment &  Plan:

## 2011-02-05 NOTE — Assessment & Plan Note (Signed)
Deteriorated, low fat diet discussed and encouraged 

## 2011-02-05 NOTE — Assessment & Plan Note (Signed)
Controlled, no change in medication  

## 2011-07-08 ENCOUNTER — Other Ambulatory Visit: Payer: Self-pay | Admitting: Family Medicine

## 2011-07-08 DIAGNOSIS — Z1231 Encounter for screening mammogram for malignant neoplasm of breast: Secondary | ICD-10-CM

## 2011-07-23 ENCOUNTER — Ambulatory Visit
Admission: RE | Admit: 2011-07-23 | Discharge: 2011-07-23 | Disposition: A | Discharge status: Home / Self Care | Payer: Managed Care, Other (non HMO) | Source: Ambulatory Visit | Attending: Family Medicine | Admitting: Family Medicine

## 2011-07-23 ENCOUNTER — Other Ambulatory Visit: Payer: Self-pay | Admitting: Family Medicine

## 2011-07-23 DIAGNOSIS — Z1231 Encounter for screening mammogram for malignant neoplasm of breast: Secondary | ICD-10-CM

## 2011-08-11 ENCOUNTER — Other Ambulatory Visit: Payer: Self-pay | Admitting: Family Medicine

## 2011-08-12 ENCOUNTER — Ambulatory Visit (INDEPENDENT_AMBULATORY_CARE_PROVIDER_SITE_OTHER): Payer: Managed Care, Other (non HMO) | Admitting: Family Medicine

## 2011-08-12 ENCOUNTER — Encounter: Payer: Self-pay | Admitting: Family Medicine

## 2011-08-12 VITALS — BP 122/80 | HR 80 | Resp 18 | Ht 66.0 in | Wt 154.1 lb

## 2011-08-12 DIAGNOSIS — R142 Eructation: Secondary | ICD-10-CM | POA: Insufficient documentation

## 2011-08-12 DIAGNOSIS — E785 Hyperlipidemia, unspecified: Secondary | ICD-10-CM

## 2011-08-12 DIAGNOSIS — R141 Gas pain: Secondary | ICD-10-CM

## 2011-08-12 DIAGNOSIS — R6881 Early satiety: Secondary | ICD-10-CM

## 2011-08-12 DIAGNOSIS — M25569 Pain in unspecified knee: Secondary | ICD-10-CM

## 2011-08-12 DIAGNOSIS — B369 Superficial mycosis, unspecified: Secondary | ICD-10-CM

## 2011-08-12 DIAGNOSIS — I10 Essential (primary) hypertension: Secondary | ICD-10-CM

## 2011-08-12 DIAGNOSIS — R143 Flatulence: Secondary | ICD-10-CM

## 2011-08-12 LAB — LIPID PANEL
LDL Cholesterol: 135 mg/dL — ABNORMAL HIGH (ref 0–99)
Total CHOL/HDL Ratio: 2.5 Ratio
VLDL: 9 mg/dL (ref 0–40)

## 2011-08-12 LAB — CBC WITH DIFFERENTIAL/PLATELET
Hemoglobin: 12.5 g/dL (ref 12.0–15.0)
Lymphs Abs: 3 10*3/uL (ref 0.7–4.0)
MCH: 30.1 pg (ref 26.0–34.0)
Monocytes Relative: 7 % (ref 3–12)
Neutro Abs: 2.4 10*3/uL (ref 1.7–7.7)
Neutrophils Relative %: 40 % — ABNORMAL LOW (ref 43–77)
RBC: 4.15 MIL/uL (ref 3.87–5.11)

## 2011-08-12 LAB — BASIC METABOLIC PANEL
CO2: 29 mEq/L (ref 19–32)
Glucose, Bld: 76 mg/dL (ref 70–99)
Potassium: 4 mEq/L (ref 3.5–5.3)
Sodium: 144 mEq/L (ref 135–145)

## 2011-08-12 LAB — TSH: TSH: 1.225 u[IU]/mL (ref 0.350–4.500)

## 2011-08-12 LAB — HEPATIC FUNCTION PANEL
Bilirubin, Direct: 0.1 mg/dL (ref 0.0–0.3)
Total Bilirubin: 0.4 mg/dL (ref 0.3–1.2)

## 2011-08-12 MED ORDER — CLOTRIMAZOLE-BETAMETHASONE 1-0.05 % EX CREA: TOPICAL_CREAM | Freq: Two times a day (BID) | CUTANEOUS | Status: AC

## 2011-08-12 MED ORDER — VALSARTAN-HYDROCHLOROTHIAZIDE 320-25 MG PO TABS: 1.0000 | ORAL_TABLET | Freq: Every day | ORAL | Status: DC

## 2011-08-12 NOTE — Progress Notes (Signed)
  Subjective:    Patient ID: Adrienne [name], female    DOB: 07-16-1945, 66 y.o.   MRN: [id number]  HPI The PT is here for follow up and re-evaluation of chronic medical conditions, medication management and review of any available recent lab and radiology data.  Preventive health is updated, specifically  Cancer screening and Immunization.   Questions or concerns regarding consultations or procedures which the PT has had in the interim are  addressed. The PT denies any adverse reactions to current medications since the last visit.  C/o recent onset in the past 2 to 3 months, of excessive belching and early satiety. Also has concerns about a rash since her mammogram      Review of Systems See HPI Denies recent fever or chills. Denies sinus pressure, nasal congestion, ear pain or sore throat. Denies chest congestion, productive cough or wheezing. Denies chest pains, palpitations and leg swelling Denies abdominal pain, nausea, vomiting,diarrhea or constipation.   Denies dysuria, frequency, hesitancy or incontinence. Denies joint pain, swelling and limitation in mobility. Denies headaches, seizures, numbness, or tingling. Denies depression, anxiety or insomnia.         Objective:   Physical Exam Patient alert and oriented and in no cardiopulmonary distress.  HEENT: No facial asymmetry, EOMI, no sinus tenderness,  oropharynx pink and moist.  Neck supple no adenopathy.  Chest: Clear to auscultation bilaterally.  CVS: S1, S2 no murmurs, no S3.  ABD: Soft non tender. Bowel sounds normal.  Ext: No edema  MS: Adequate ROM spine, shoulders, hips and knees.  Skin: Intact, no ulcerations  Noted.hyperpigmented macular rash on posterior chest in axillary area, right greater than left  Psych: Good eye contact, normal affect. Memory intact not anxious or depressed appearing.  CNS: CN 2-12 intact, power, tone and sensation normal throughout.        Assessment & Plan:

## 2011-08-12 NOTE — Patient Instructions (Addendum)
F/u in 6 month  Fasting lipid , particle size, and chem 7 in 6 month   No medication changes    Please schedule an eye exam   A diet rich in fresh unprocessed fruit and vegetables, is the healthiest way to eat  It is important that you exercise regularly at least 45 minutes 5 times a week. If you develop chest pain, have severe difficulty breathing, or feel very tired, stop exercising immediately and seek medical attention

## 2011-08-13 ENCOUNTER — Telehealth: Payer: Self-pay | Admitting: Gastroenterology

## 2011-08-13 NOTE — Telephone Encounter (Signed)
Pt is being referred to [place] by Dr Lodema Hong. I have left 2 voice messages for a return call. I have not heard from patient yet.

## 2011-08-14 NOTE — Assessment & Plan Note (Signed)
Controlled, no change in medication  

## 2011-08-14 NOTE — Assessment & Plan Note (Signed)
Deterioration in lDL, pt to continue dietary modification

## 2011-08-14 NOTE — Assessment & Plan Note (Signed)
medication prescribed, rash present in axilla and back following recent mammogram reportedly

## 2011-08-14 NOTE — Assessment & Plan Note (Signed)
Recent onset of a "feeling of fullness" after eating, feels as if water just "sits in her upper chest" after a meal, with new excessive belching. Ill have GI eval further as deemed necessary

## 2011-08-14 NOTE — Assessment & Plan Note (Signed)
Improved with regular activity

## 2011-08-16 ENCOUNTER — Ambulatory Visit (INDEPENDENT_AMBULATORY_CARE_PROVIDER_SITE_OTHER): Payer: Managed Care, Other (non HMO) | Admitting: Gastroenterology

## 2011-08-16 ENCOUNTER — Encounter: Payer: Self-pay | Admitting: Gastroenterology

## 2011-08-16 VITALS — BP 139/86 | HR 70 | Temp 97.6°F | Ht 65.2 in | Wt 154.4 lb

## 2011-08-16 DIAGNOSIS — R141 Gas pain: Secondary | ICD-10-CM

## 2011-08-16 DIAGNOSIS — R142 Eructation: Secondary | ICD-10-CM

## 2011-08-16 DIAGNOSIS — R6881 Early satiety: Secondary | ICD-10-CM

## 2011-08-16 NOTE — Progress Notes (Signed)
Faxed to PCP

## 2011-08-16 NOTE — Assessment & Plan Note (Signed)
Four month h/o early satiety and excessive belching. No other complaints. Denies heartburn. Denies overeating. Consumes high fiber diet. Need to consider gastritis/PUD/gastroparesis, hiatal hernia, malignancy (less likely). Recommend EGD for further evaluation. Patient also given option of UGI series but she knows this may lead to need for EGD if abnormal. She wants to think about it and will call when ready to schedule.   Gas/bloat sheet provided. She should avoid drinking from straws, consuming hard candies/gum. Minimize gas producing foods. She is opposed to PPI.

## 2011-08-16 NOTE — Patient Instructions (Signed)
Please let me know what you decide regarding having an upper endoscopy or barium study to further evaluate your symptoms.  Bloating Bloating is the feeling of fullness in your belly. You may feel as though your pants are too tight. Often the cause of bloating is overeating, retaining fluids, or having gas in your bowel. It is also caused by swallowing air and eating foods that cause gas. Irritable bowel syndrome is one of the most common causes of bloating. Constipation is also a common cause. Sometimes more serious problems can cause bloating. SYMPTOMS  Usually there is a feeling of fullness, as though your abdomen is bulged out. There may be mild discomfort.  DIAGNOSIS  Usually no particular testing is necessary for most bloating. If the condition persists and seems to become worse, your caregiver may do additional testing.  TREATMENT   There is no direct treatment for bloating.   Do not put gas into the bowel. Avoid chewing gum and sucking on candy. These tend to make you swallow air. Swallowing air can also be a nervous habit. Try to avoid this.   Avoiding high residue diets will help. Eat foods with soluble fibers (examples include root vegetables, apples, or barley) and substitute dairy products with soy and rice products. This helps irritable bowel syndrome.   If constipation is the cause, then a high residue diet with more fiber will help.   Avoid carbonated beverages.   Over-the-counter preparations are available that help reduce gas. Your pharmacist can help you with this.  SEEK MEDICAL CARE IF:   Bloating continues and seems to be getting worse.   You notice a weight gain.   You have a weight loss but the bloating is getting worse.   You have changes in your bowel habits or develop nausea or vomiting.  SEEK IMMEDIATE MEDICAL CARE IF:   You develop shortness of breath or swelling in your legs.   You have an increase in abdominal pain or develop chest pain.  Document  Released: 02/10/2006 Document Revised: 04/01/2011 Document Reviewed: 03/31/2007 [facility] Patient Information 2012 [place], [place].

## 2011-08-16 NOTE — Progress Notes (Signed)
Primary Care Physician:  Syliva Overman, MD, MD  Primary Gastroenterologist:  Jonette Eva, MD   Chief Complaint  Patient presents with  . Gastrophageal Reflux    lot of belching    HPI:  Adrienne [name] is a 66 y.o. female here for further evaluation of four month h/o of excessive belching. No heartburn. If leans over, feels like food coming up. Trying to eat slower. Early satiety. Trying to loose weight, decreasing portion size. No vomiting. No dysphagia. No abdominal pain. No melena, brbpr. BM regular with increased fiber. Very meticulous with regards to taking supplements and eating healthy.  Current Outpatient Prescriptions  Medication Sig Dispense Refill  . amLODipine (NORVASC) 2.5 MG tablet Take 1 tablet (2.5 mg total) by mouth daily.  90 tablet  1  . B-Complex TABS Take by mouth daily.        Marland Kitchen BIOTIN 5000 PO Take by mouth daily.        Marland Kitchen CALCIUM-MAGNESUIUM-ZINC 333-133-8.3 MG TABS Take by mouth 3 (three) times daily.        . clotrimazole-betamethasone (LOTRISONE) cream Apply topically 2 (two) times daily.  45 g  1  . COENZYME Q-10 PO Take by mouth daily.        [place] 500 MG CAPS Take by mouth daily.        Marland Kitchen FISH OIL-BORAGE-FLAX-SAFFLOWER PO Take 3 tablets by mouth daily.        Marland Kitchen L-Lysine 1000 MG TABS Take by mouth. Take 2 tablets by mouth daily         . Multiple Vitamin (MULTIVITAMIN) tablet Take 1 tablet by mouth daily. Take 1/2 by mouth in the AM and half at night       . Omega-3 Fatty Acids (FISH OIL) 1200 MG CAPS Take by mouth 3 (three) times daily.        . valsartan-hydrochlorothiazide (DIOVAN-HCT) 320-25 MG per tablet Take 1 tablet by mouth daily.  90 tablet  1    Allergies as of 08/16/2011  . (No Known Allergies)    Past Medical History  Diagnosis Date  . Hypertension     Past Surgical History  Procedure Date  . Cholecystectomy 1980  . Abdominal hysterectomy 1990    for fibroids ovaries remain   . Left nipple inversion bx benign 2000  .  Colonoscopy 05/2009    rare diverticula, small internal hemorrhoids. Next colonoscopy in 5-10 yrs.    Family History  Problem Relation Age of Onset  . Kidney failure Father   . Arthritis Father   . Breast cancer Sister   . Hypertension Sister   . Arthritis Sister   . Diabetes Sister   . Arthritis Sister   . Lung cancer Brother   . Arthritis Brother   . Arthritis Mother     History   Social History  . Marital Status: Single    Spouse Name: N/A    Number of Children: 0  . Years of Education: N/A   Occupational History  . retired    Social History Main Topics  . Smoking status: Never Smoker   . Smokeless tobacco: Not on file  . Alcohol Use: Yes     rarely   . Drug Use: No  . Sexually Active: Not on file   Other Topics Concern  . Not on file   Social History Narrative  . No narrative on file      ROS:  General: Negative for anorexia, weight loss, fever, chills, fatigue,  weakness. Eyes: Negative for vision changes.  ENT: Negative for hoarseness, difficulty swallowing , nasal congestion. CV: Negative for chest pain, angina, palpitations, dyspnea on exertion, peripheral edema.  Respiratory: Negative for dyspnea at rest, dyspnea on exertion, cough, sputum, wheezing.  GI: See history of present illness. GU:  Negative for dysuria, hematuria, urinary incontinence, urinary frequency, nocturnal urination.  MS: Negative for joint pain, low back pain.  Derm: Negative for rash or itching.  Neuro: Negative for weakness, abnormal sensation, seizure, frequent headaches, memory loss, confusion.  Psych: Negative for anxiety, depression, suicidal ideation, hallucinations.  Endo: Negative for unusual weight change.  Heme: Negative for bruising or bleeding. Allergy: Negative for rash or hives.    Physical Examination:  BP 139/86  Pulse 70  Temp(Src) 97.6 F (36.4 C) (Temporal)  Ht 5' 5.2" (1.656 m)  Wt 154 lb 6.4 oz (70.035 kg)  BMI 25.54 kg/m2   General:  Well-nourished, well-developed in no acute distress.  Head: Normocephalic, atraumatic.   Eyes: Conjunctiva pink, no icterus. Mouth: Oropharyngeal mucosa moist and pink , no lesions erythema or exudate. Neck: Supple without thyromegaly, masses, or lymphadenopathy.  Lungs: Clear to auscultation bilaterally.  Heart: Regular rate and rhythm, no murmurs rubs or gallops.  Abdomen: Bowel sounds are normal, nontender, nondistended, no hepatosplenomegaly or masses, no abdominal bruits or    hernia , no rebound or guarding.   Rectal: not performed Extremities: No lower extremity edema. No clubbing or deformities.  Neuro: Alert and oriented x 4 , grossly normal neurologically.  Skin: Warm and dry, no rash or jaundice.   Psych: Alert and cooperative, normal mood and affect.  Labs: Lab Results  Component Value Date   WBC 5.9 08/11/2011   HGB 12.5 08/11/2011   HCT 38.8 08/11/2011   MCV 93.5 08/11/2011   PLT 256 08/11/2011   Lab Results  Component Value Date   CREATININE 0.64 08/11/2011   BUN 18 08/11/2011   NA 144 08/11/2011   K 4.0 08/11/2011   CL 106 08/11/2011   CO2 29 08/11/2011   Lab Results  Component Value Date   ALT 19 08/11/2011   AST 22 08/11/2011   ALKPHOS 84 08/11/2011   BILITOT 0.4 08/11/2011   Lab Results  Component Value Date   TSH 1.225 08/11/2011

## 2011-09-02 NOTE — Progress Notes (Signed)
REVIEWED.  

## 2011-09-15 ENCOUNTER — Telehealth: Payer: Self-pay | Admitting: Family Medicine

## 2011-09-16 NOTE — Telephone Encounter (Signed)
Returned patients call - need clarification as to the charge she feels is incorrect since she has outstanding deductible also due.  Left msg asking for return call.  JSH

## 2011-10-09 ENCOUNTER — Other Ambulatory Visit: Payer: Self-pay | Admitting: Family Medicine

## 2011-12-29 ENCOUNTER — Telehealth: Payer: Self-pay | Admitting: Family Medicine

## 2011-12-29 MED ORDER — AMLODIPINE BESYLATE 2.5 MG PO TABS: 2.5000 mg | ORAL_TABLET | Freq: Every day | ORAL | Status: DC

## 2011-12-29 NOTE — Telephone Encounter (Signed)
Sent to [facility] 

## 2012-02-15 ENCOUNTER — Ambulatory Visit: Payer: Managed Care, Other (non HMO) | Admitting: Family Medicine

## 2012-03-01 ENCOUNTER — Other Ambulatory Visit: Payer: Self-pay | Admitting: Family Medicine

## 2012-03-02 LAB — BASIC METABOLIC PANEL
BUN: 17 mg/dL (ref 6–23)
CO2: 29 mEq/L (ref 19–32)
Chloride: 104 mEq/L (ref 96–112)
Creat: 0.71 mg/dL (ref 0.50–1.10)

## 2012-03-02 LAB — LIPID PANEL
HDL: 90 mg/dL (ref >39)
LDL Cholesterol: 108 mg/dL — ABNORMAL HIGH (ref 0–99)

## 2012-03-13 ENCOUNTER — Encounter: Payer: Self-pay | Admitting: Family Medicine

## 2012-03-13 ENCOUNTER — Ambulatory Visit (INDEPENDENT_AMBULATORY_CARE_PROVIDER_SITE_OTHER): Payer: Managed Care, Other (non HMO) | Admitting: Family Medicine

## 2012-03-13 VITALS — BP 120/80 | HR 77 | Resp 15 | Ht 65.2 in | Wt 157.0 lb

## 2012-03-13 DIAGNOSIS — I10 Essential (primary) hypertension: Secondary | ICD-10-CM

## 2012-03-13 DIAGNOSIS — J019 Acute sinusitis, unspecified: Secondary | ICD-10-CM

## 2012-03-13 DIAGNOSIS — E663 Overweight: Secondary | ICD-10-CM | POA: Insufficient documentation

## 2012-03-13 DIAGNOSIS — E785 Hyperlipidemia, unspecified: Secondary | ICD-10-CM

## 2012-03-13 DIAGNOSIS — Z6825 Body mass index (BMI) 25.0-25.9, adult: Secondary | ICD-10-CM

## 2012-03-13 DIAGNOSIS — R5381 Other malaise: Secondary | ICD-10-CM

## 2012-03-13 MED ORDER — AZITHROMYCIN 250 MG PO TABS: ORAL_TABLET | ORAL | Status: AC

## 2012-03-13 MED ORDER — FLUCONAZOLE 150 MG PO TABS: ORAL_TABLET | ORAL | Status: AC

## 2012-03-13 NOTE — Assessment & Plan Note (Signed)
Controlled, no change in medication DASH diet and commitment to daily physical activity for a minimum of 30 minutes discussed and encouraged, as a part of hypertension management. The importance of attaining a healthy weight is also discussed.  

## 2012-03-13 NOTE — Patient Instructions (Addendum)
F/u end April  Please call if you need me before  You are being treated for acute sinusitis, also use daily nasal flushes with saline to help to clear your nostrils.  You will get a script for fluconazole for use only if you get vaginal itching  Please get the flu vaccine end of next week when better   Labs are excellent and have improved, you are almost at goal with your lipids!  It is important that you exercise regularly at least 30 minutes 5 times a week. If you develop chest pain, have severe difficulty breathing, or feel very tired, stop exercising immediately and seek medical attention   Weight loss goal of 6 pounds in the next 6 month  Blood pressure is excellent  Fasting CBC, chem 7, lipid in 6 month  Please log into "my chart" to follow your labs

## 2012-03-13 NOTE — Assessment & Plan Note (Signed)
3 day h/o head congestion and chills antibiotic course prescribed

## 2012-03-13 NOTE — Assessment & Plan Note (Signed)
Deteriorated. Patient re-educated about  the importance of commitment to a  minimum of 150 minutes of exercise per week. The importance of healthy food choices with portion control discussed. Encouraged to start a food diary, count calories and to consider  joining a support group. Sample diet sheets offered. Goals set by the patient for the next several months.    

## 2012-03-13 NOTE — Assessment & Plan Note (Signed)
Marked improvement, with dietary change. Pt applauded on this and encouraged to continue same Hyperlipidemia:Low fat diet discussed and encouraged.

## 2012-03-13 NOTE — Progress Notes (Signed)
  Subjective:    Patient ID: Adrienne [name], female    DOB: 10/10/1945, 66 y.o.   MRN: [id number]  HPI The PT is here for follow up and re-evaluation of chronic medical conditions, medication management and review of any available recent lab and radiology data.  Preventive health is updated, specifically  Cancer screening and Immunization.   Questions or concerns regarding consultations or procedures which the PT has had in the interim are  addressed. The PT denies any adverse reactions to current medications since the last visit.  3 day h/o head congestion and chills, foul tasting post nasal drainage, no ear pain , sore throat or cough Has not been as diligent with exercise as in the past, and was recently over eating on vacation, intends to work on this    Review of Systems See HPI Denies chest pains, palpitations and leg swelling Denies abdominal pain, nausea, vomiting,diarrhea or constipation.   Denies dysuria, frequency, hesitancy or incontinence. Denies joint pain, swelling and limitation in mobility.Occasional knee pain, but not significant Denies headaches, seizures, numbness, or tingling. Denies depression, anxiety or insomnia. Denies skin break down or rash.        Objective:   Physical Exam  Patient alert and oriented and in no cardiopulmonary distress.  HEENT: No facial asymmetry, EOMI, mild frontal sinus tenderness,  oropharynx pink and moist.  Neck supple no adenopathy.Nasal mucosa erythematous and edematous  Chest: Clear to auscultation bilaterally.  CVS: S1, S2 no murmurs, no S3.  ABD: Soft non tender. Bowel sounds normal.  Ext: No edema  MS: Adequate ROM spine, shoulders, hips and knees.  Skin: Intact, no ulcerations or rash noted.  Psych: Good eye contact, normal affect. Memory intact not anxious or depressed appearing.  CNS: CN 2-12 intact, power, tone and sensation normal throughout.       Assessment & Plan:

## 2012-04-14 ENCOUNTER — Telehealth: Payer: Self-pay | Admitting: Family Medicine

## 2012-04-14 MED ORDER — AMLODIPINE BESYLATE 2.5 MG PO TABS: 2.5000 mg | ORAL_TABLET | Freq: Every day | ORAL | Status: DC

## 2012-04-14 NOTE — Telephone Encounter (Signed)
Med sent  In

## 2012-04-27 ENCOUNTER — Telehealth: Payer: Self-pay | Admitting: Family Medicine

## 2012-04-28 ENCOUNTER — Other Ambulatory Visit: Payer: Self-pay | Admitting: Family Medicine

## 2012-04-28 MED ORDER — FLUCONAZOLE 150 MG PO TABS: ORAL_TABLET | ORAL | Status: AC

## 2012-04-28 NOTE — Telephone Encounter (Signed)
Sent to [facility] pls let her know

## 2012-05-09 ENCOUNTER — Other Ambulatory Visit: Payer: Self-pay | Admitting: Family Medicine

## 2012-05-12 ENCOUNTER — Telehealth: Payer: Self-pay | Admitting: Family Medicine

## 2012-05-12 MED ORDER — BENZONATATE 100 MG PO CAPS: 100.0000 mg | ORAL_CAPSULE | Freq: Four times a day (QID) | ORAL | Status: DC | PRN

## 2012-05-12 MED ORDER — PREDNISONE 5 MG PO TABS: 5.0000 mg | ORAL_TABLET | Freq: Two times a day (BID) | ORAL | Status: AC

## 2012-05-12 NOTE — Telephone Encounter (Signed)
States she ha green sputum, has never taken the z pack prescribed yet, but will start tonight Describes chest tightness and congestion. Will send tessalon perles and prdnisone in for 5 days to [facility], she is aware

## 2012-06-27 ENCOUNTER — Telehealth: Payer: Self-pay | Admitting: Family Medicine

## 2012-06-27 MED ORDER — VALSARTAN-HYDROCHLOROTHIAZIDE 320-25 MG PO TABS: ORAL_TABLET | ORAL | Status: DC

## 2012-06-27 NOTE — Telephone Encounter (Signed)
Sent in

## 2012-07-10 ENCOUNTER — Other Ambulatory Visit: Payer: Self-pay

## 2012-07-10 MED ORDER — VALSARTAN-HYDROCHLOROTHIAZIDE 320-25 MG PO TABS: ORAL_TABLET | ORAL | Status: DC

## 2012-07-13 ENCOUNTER — Other Ambulatory Visit: Payer: Self-pay

## 2012-07-13 ENCOUNTER — Telehealth: Payer: Self-pay | Admitting: Family Medicine

## 2012-07-13 MED ORDER — VALSARTAN-HYDROCHLOROTHIAZIDE 320-25 MG PO TABS: ORAL_TABLET | ORAL | Status: DC

## 2012-07-13 NOTE — Telephone Encounter (Signed)
Patient is asking for 7 day supply to be sent locally since mail order has not sent out her Diovan as of yet.  She has been in contact with mail order and she was told by a [place] that she can receive the 7 day supply at no cost.

## 2012-09-11 ENCOUNTER — Other Ambulatory Visit: Payer: Self-pay | Admitting: Family Medicine

## 2012-09-11 LAB — CBC WITH DIFFERENTIAL/PLATELET
HCT: 36.9 % (ref 36.0–46.0)
Hemoglobin: 13.1 g/dL (ref 12.0–15.0)
Lymphocytes Relative: 52 % — ABNORMAL HIGH (ref 12–46)
MCHC: 35.5 g/dL (ref 30.0–36.0)
MCV: 86.6 fL (ref 78.0–100.0)
Monocytes Absolute: 0.4 10*3/uL (ref 0.1–1.0)
Monocytes Relative: 7 % (ref 3–12)
Neutro Abs: 2 10*3/uL (ref 1.7–7.7)

## 2012-09-11 LAB — BASIC METABOLIC PANEL
BUN: 14 mg/dL (ref 6–23)
Chloride: 103 mEq/L (ref 96–112)
Glucose, Bld: 80 mg/dL (ref 70–99)
Potassium: 4.3 mEq/L (ref 3.5–5.3)

## 2012-09-11 LAB — LIPID PANEL
LDL Cholesterol: 132 mg/dL — ABNORMAL HIGH (ref 0–99)
VLDL: 10 mg/dL (ref 0–40)

## 2012-09-12 ENCOUNTER — Ambulatory Visit (INDEPENDENT_AMBULATORY_CARE_PROVIDER_SITE_OTHER): Payer: Managed Care, Other (non HMO) | Admitting: Family Medicine

## 2012-09-12 ENCOUNTER — Encounter: Payer: Self-pay | Admitting: Family Medicine

## 2012-09-12 VITALS — BP 124/78 | HR 70 | Resp 16 | Ht 65.0 in | Wt 157.0 lb

## 2012-09-12 DIAGNOSIS — M25569 Pain in unspecified knee: Secondary | ICD-10-CM

## 2012-09-12 DIAGNOSIS — R5383 Other fatigue: Secondary | ICD-10-CM

## 2012-09-12 DIAGNOSIS — I10 Essential (primary) hypertension: Secondary | ICD-10-CM

## 2012-09-12 DIAGNOSIS — Z6825 Body mass index (BMI) 25.0-25.9, adult: Secondary | ICD-10-CM

## 2012-09-12 DIAGNOSIS — E663 Overweight: Secondary | ICD-10-CM

## 2012-09-12 DIAGNOSIS — E785 Hyperlipidemia, unspecified: Secondary | ICD-10-CM

## 2012-09-12 DIAGNOSIS — R5381 Other malaise: Secondary | ICD-10-CM

## 2012-09-12 NOTE — Progress Notes (Signed)
  Subjective:    Patient ID: Adrienne [name], female    DOB: 08-16-1945, 67 y.o.   MRN: [id number]  HPI The PT is here for follow up and re-evaluation of chronic medical conditions, medication management and review of any available recent lab and radiology data.  Preventive health is updated, specifically  Cancer screening and Immunization.   Questions or concerns regarding consultations or procedures which the PT has had in the interim are  addressed. The PT denies any adverse reactions to current medications since the last visit.  There are no new concerns.  There are no specific complaints       Review of Systems See HPI Denies recent fever or chills. Denies sinus pressure, nasal congestion, ear pain or sore throat. Denies chest congestion, productive cough or wheezing. Denies chest pains, palpitations and leg swelling Denies abdominal pain, nausea, vomiting,diarrhea or constipation.   Denies dysuria, frequency, hesitancy or incontinence. Denies joint pain, swelling and limitation in mobility. Denies headaches, seizures, numbness, or tingling. Denies depression, anxiety or insomnia. Denies skin break down or rash.        Objective:   Physical Exam  Patient alert and oriented and in no cardiopulmonary distress.  HEENT: No facial asymmetry, EOMI, no sinus tenderness,  oropharynx pink and moist.  Neck supple no adenopathy.  Chest: Clear to auscultation bilaterally.  CVS: S1, S2 no murmurs, no S3.  ABD: Soft non tender. Bowel sounds normal.  Ext: No edema  MS: Adequate ROM spine, shoulders, hips and knees.  Skin: Intact, no ulcerations or rash noted.  Psych: Good eye contact, normal affect. Memory intact not anxious or depressed appearing.  CNS: CN 2-12 intact, power, tone and sensation normal throughout.       Assessment & Plan:

## 2012-09-12 NOTE — Patient Instructions (Addendum)
F/u in 6 month, call if you need me before  Cholesterol has increased, please start eating your own cooking again and commit to at least 30 minutes every day of exercise for your health  VitD level will be added and you will be contacted if low.  Please log into "my chart" to view results etc   Please schedule your mammogram , this is past due.  Please check with your gyne when you next see them re stool tests, you need this every year for cancer screening for the colon. This will be done at your next visit here if needed  Please start aspirin 81mg  one daily, shown to reduce strokes in women 55 and over  Fasting lipid, chem 7 and TSH in 6 month  Weight loss goal of 6 to 10 pounds!

## 2012-09-18 ENCOUNTER — Other Ambulatory Visit: Payer: Self-pay | Admitting: Family Medicine

## 2012-09-18 NOTE — Assessment & Plan Note (Signed)
Unchanged. Patient re-educated about  the importance of commitment to a  minimum of 150 minutes of exercise per week. The importance of healthy food choices with portion control discussed. Encouraged to start a food diary, count calories and to consider  joining a support group. Sample diet sheets offered. Goals set by the patient for the next several months.    

## 2012-09-18 NOTE — Assessment & Plan Note (Addendum)
Hyperlipidemia:Low fat diet discussed and encouraged.  Needs to work more consistently on low fat diet, numbers have increased, pt motivated to reverse this

## 2012-09-18 NOTE — Assessment & Plan Note (Signed)
Controlled, no change in medication DASH diet and commitment to daily physical activity for a minimum of 30 minutes discussed and encouraged, as a part of hypertension management. The importance of attaining a healthy weight is also discussed.  

## 2012-09-18 NOTE — Assessment & Plan Note (Signed)
Improved, currently asymptomatic

## 2013-03-09 ENCOUNTER — Other Ambulatory Visit: Payer: Self-pay | Admitting: Family Medicine

## 2013-03-09 DIAGNOSIS — R5381 Other malaise: Secondary | ICD-10-CM | POA: Diagnosis not present

## 2013-03-09 DIAGNOSIS — E785 Hyperlipidemia, unspecified: Secondary | ICD-10-CM | POA: Diagnosis not present

## 2013-03-09 DIAGNOSIS — I10 Essential (primary) hypertension: Secondary | ICD-10-CM | POA: Diagnosis not present

## 2013-03-09 LAB — BASIC METABOLIC PANEL
BUN: 14 mg/dL (ref 6–23)
Calcium: 9.9 mg/dL (ref 8.4–10.5)
Creat: 0.71 mg/dL (ref 0.50–1.10)
Glucose, Bld: 79 mg/dL (ref 70–99)
Sodium: 140 mEq/L (ref 135–145)

## 2013-03-09 LAB — LIPID PANEL: Cholesterol: 246 mg/dL — ABNORMAL HIGH (ref 0–200)

## 2013-03-20 ENCOUNTER — Ambulatory Visit (HOSPITAL_COMMUNITY)
Admission: RE | Admit: 2013-03-20 | Discharge: 2013-03-20 | Disposition: A | Discharge status: Home / Self Care | Payer: Medicare Other | Source: Ambulatory Visit | Attending: Family Medicine | Admitting: Family Medicine

## 2013-03-20 ENCOUNTER — Encounter (INDEPENDENT_AMBULATORY_CARE_PROVIDER_SITE_OTHER): Payer: Self-pay

## 2013-03-20 ENCOUNTER — Ambulatory Visit (INDEPENDENT_AMBULATORY_CARE_PROVIDER_SITE_OTHER): Payer: Medicare Other | Admitting: Family Medicine

## 2013-03-20 ENCOUNTER — Encounter: Payer: Self-pay | Admitting: Family Medicine

## 2013-03-20 VITALS — BP 122/80 | HR 55 | Resp 16 | Ht 65.0 in | Wt 159.0 lb

## 2013-03-20 DIAGNOSIS — I10 Essential (primary) hypertension: Secondary | ICD-10-CM | POA: Diagnosis not present

## 2013-03-20 DIAGNOSIS — R059 Cough, unspecified: Secondary | ICD-10-CM | POA: Insufficient documentation

## 2013-03-20 DIAGNOSIS — Z1239 Encounter for other screening for malignant neoplasm of breast: Secondary | ICD-10-CM

## 2013-03-20 DIAGNOSIS — R05 Cough: Secondary | ICD-10-CM | POA: Insufficient documentation

## 2013-03-20 DIAGNOSIS — E785 Hyperlipidemia, unspecified: Secondary | ICD-10-CM

## 2013-03-20 DIAGNOSIS — Z83511 Family history of glaucoma: Secondary | ICD-10-CM

## 2013-03-20 DIAGNOSIS — Z23 Encounter for immunization: Secondary | ICD-10-CM

## 2013-03-20 DIAGNOSIS — E663 Overweight: Secondary | ICD-10-CM

## 2013-03-20 DIAGNOSIS — Z6825 Body mass index (BMI) 25.0-25.9, adult: Secondary | ICD-10-CM

## 2013-03-20 MED ORDER — TRIAMTERENE-HCTZ 37.5-25 MG PO TABS: 1.0000 | ORAL_TABLET | Freq: Every day | ORAL | Status: DC

## 2013-03-20 NOTE — Progress Notes (Signed)
  Subjective:    Patient ID: Adrienne [name], female    DOB: Aug 04, 1945, 67 y.o.   MRN: [id number]  HPI The PT is here for follow up and re-evaluation of chronic medical conditions, medication management and review of any available recent lab and radiology data.  Preventive health is updated, specifically  Cancer screening and Immunization.Mammogram past due , and flu and pneumonia vaccines are administered  C/o worsening non productive cough in the past 6 month, sometimes feels as though her throat is closing , and unable to breathe. No fever or chills . Needs eye exam      Review of Systems See HPI Denies recent fever or chills. Denies sinus pressure, nasal congestion, ear pain or sore throat. Denies chest congestion, productive cough or wheezing.States that in the past approx 6 month, she has been bothered increasingly by a dry cough, which at times is so excessive that she feels as though she is unable to breathe. Following recent death of a friend with asthma, she is even more conscious of this and keeps a phone nearby, in the event that she has to call 911.No significant c/o post nasal drip or uncontrolled allergies, no c/o suggestive of gERD Denies chest pains, palpitations and leg swelling Denies abdominal pain, nausea, vomiting,diarrhea or constipation.   Denies dysuria, frequency, hesitancy or incontinence. Denies joint pain, swelling and limitation in mobility. Denies headaches, seizures, numbness, or tingling. Denies depression, anxiety or insomnia. Denies skin break down or rash.        Objective:   Physical Exam   Patient alert and oriented and in no cardiopulmonary distress.  HEENT: No facial asymmetry, EOMI, no sinus tenderness,  oropharynx pink and moist.  Neck supple no adenopathy.  Chest: Clear to auscultation bilaterally.  CVS: S1, S2 no murmurs, no S3.  ABD: Soft non tender. Bowel sounds normal.  Ext: No edema  MS: Adequate ROM spine, shoulders, hips  and knees.  Skin: Intact, no ulcerations or rash noted.  Psych: Good eye contact, normal affect. Memory intact not anxious or depressed appearing.  CNS: CN 2-12 intact, power, tone and sensation normal throughout.      Assessment & Plan:

## 2013-03-20 NOTE — Patient Instructions (Addendum)
Welcome to medicare in 2 month, call if you need me before  Flu and pneumonia vaccine today.  cXR today for chronic cough  You are referred to lung specialist to evaluate chronic cough   Please schedule your mammogram today when you go for your CXR  STOP valsartan/hctz , I believe your cough and difficulty breathing is from the valsartan  New for blood pressure is triamterene 25mg  one daily  Please change eating to more fruit and vegetable and commit to daily exercise, silver sneakers gives you access to the [facility]

## 2013-03-24 ENCOUNTER — Encounter: Payer: Self-pay | Admitting: Family Medicine

## 2013-03-24 DIAGNOSIS — Z83511 Family history of glaucoma: Secondary | ICD-10-CM | POA: Insufficient documentation

## 2013-03-24 NOTE — Assessment & Plan Note (Signed)
Controlled, however current concern is that pt s allergic to ARB also, hence change in medication to maxzide. DASH diet and commitment to daily physical activity for a minimum of 30 minutes discussed and encouraged, as a part of hypertension management. The importance of attaining a healthy weight is also discussed. F/u in 2 month

## 2013-03-24 NOTE — Assessment & Plan Note (Signed)
Deteriorated, overall CHD ratio is low based on elevated HDL Pt to focus on dietary change, no interest in statin therapy, "low risk for CHd" based on her personal and family history \

## 2013-03-24 NOTE — Assessment & Plan Note (Signed)
Reports eye exam is past due , will do research on opthalmologist she wishes to see. Has postive f/h of glaucoma, states insurance does not cover eye exam, however based on age she should be able to have screening exam I think. She will get back to office when she has made a decision on Provider

## 2013-03-24 NOTE — Assessment & Plan Note (Signed)
May well be ARB related, no symptoms suggestive of infection CXR today, start maxzide in place of valsartan, also pulmonary eval, as pt describes episodes of feeling as though she is unable to breathe, and keeping a phone nearby a night at dial 911 if needed

## 2013-03-24 NOTE — Assessment & Plan Note (Signed)
Deteriorated. Patient re-educated about  the importance of commitment to a  minimum of 150 minutes of exercise per week. The importance of healthy food choices with portion control discussed. Encouraged to start a food diary, count calories and to consider  joining a support group. Sample diet sheets offered. Goals set by the patient for the next several months.    

## 2013-03-24 NOTE — Assessment & Plan Note (Signed)
Improved, has been evaluated by ortho

## 2013-03-28 ENCOUNTER — Ambulatory Visit (INDEPENDENT_AMBULATORY_CARE_PROVIDER_SITE_OTHER): Payer: Medicare Other | Admitting: Internal Medicine

## 2013-03-28 ENCOUNTER — Encounter: Payer: Self-pay | Admitting: Internal Medicine

## 2013-03-28 VITALS — BP 124/84 | HR 60 | Temp 98.0°F | Ht 65.0 in | Wt 160.0 lb

## 2013-03-28 DIAGNOSIS — R05 Cough: Secondary | ICD-10-CM

## 2013-03-28 DIAGNOSIS — I10 Essential (primary) hypertension: Secondary | ICD-10-CM

## 2013-03-28 NOTE — Progress Notes (Signed)
   Subjective:    Patient ID: Adrienne [name], female    DOB: 1946-03-17   MRN: [id number]  HPI  46 yobf never smoker  With sinus infection problems in 1970s while in [place] and developed cough while on BP meds ? ACEi in the late 80's early 90s and cough resolved for many years after stopped the meds then started back up about 2010 and again seemed better p changed bp meds (? Which one?)  then started back up worse than ever x 2013 so d/c'd valsartan 03/20/13 and referred to pulmonary clinic 03/28/2013 by Dr Lodema Hong.     03/28/2013 1st [facility] office visit/ Adrienne [name] cc intermittent dry coughing x one year and relapsed 3 months prior to OV  But has not recurred since.  Cough tends to recur around 3 am and awakens from a sound sleep.   Never tried inhalers, always had sensation of pnds x decades with freq throat clearing s seasonal pattern or purulent secretions. No assoc sob or wheeze/ chest tightness.  No obvious pattern in  day to day or daytime variabilty or assoc  c  overt sinus or hb symptoms. No unusual exp hx or h/o childhood pna/ asthma or knowledge of premature birth.  Presently Sleeping ok without nocturnal  or early am exacerbation  of respiratory  c/o's or need for noct saba. Also denies any obvious fluctuation of symptoms with weather or environmental changes or other aggravating or alleviating factors except as outlined above   Current Medications, Allergies, Complete Past Medical History, Past Surgical History, Family History, and Social History were reviewed in [employer] record.          Review of Systems  Constitutional: Negative for fever, chills and unexpected weight change.  HENT: Negative for congestion, dental problem, ear pain, nosebleeds, postnasal drip, rhinorrhea, sinus pressure, sneezing, sore throat, trouble swallowing and voice change.   Eyes: Negative for visual disturbance.  Respiratory: Positive for cough. Negative for choking and  shortness of breath.   Cardiovascular: Negative for chest pain and leg swelling.  Gastrointestinal: Negative for vomiting, abdominal pain and diarrhea.  Genitourinary: Negative for difficulty urinating.  Musculoskeletal: Negative for arthralgias.  Skin: Negative for rash.  Neurological: Negative for tremors, syncope and headaches.  Hematological: Does not bruise/bleed easily.       Objective:   Physical Exam  amb bf with voice fatigue  Wt Readings from Last 3 Encounters:  03/28/13 160 lb (72.576 kg)  03/20/13 159 lb (72.122 kg)  09/12/12 157 lb (71.215 kg)      HEENT: nl dentition, turbinates, and orophanx. Nl external ear canals without cough reflex   NECK :  without JVD/Nodes/TM/ nl carotid upstrokes bilaterally   LUNGS: no acc muscle use, clear to A and P bilaterally without cough on insp or exp maneuvers   CV:  RRR  no s3 or murmur or increase in P2, no edema   ABD:  soft and nontender with nl excursion in the supine position. No bruits or organomegaly, bowel sounds nl  MS:  warm without deformities, calf tenderness, cyanosis or clubbing  SKIN: warm and dry without lesions    NEURO:  alert, approp, no deficits      cxr 03/20/13 No acute abnormality noted.          Assessment & Plan:

## 2013-03-28 NOTE — Patient Instructions (Signed)
Upper airway cough syndrome causes hoarseness is worse the more you talk  Zyrtec 10 mg one daily  or chlortrimeton 4 mg one at bedtime in the event the drainage gets worse  GERD (REFLUX)  is an extremely common cause of respiratory symptoms, many times with no significant heartburn at all.    It can be treated with medication, but also with lifestyle changes including avoidance of late meals, excessive alcohol, smoking cessation, and avoid fatty foods, chocolate, peppermint, colas, red wine, and acidic juices such as orange juice.  NO MINT OR MENTHOL PRODUCTS SO NO COUGH DROPS  USE SUGARLESS CANDY INSTEAD (jolley ranchers or Stover's)  NO OIL BASED VITAMINS - use powdered substitutes.  In event of a  Bad flare > Try prilosec 20mg   Take 30-60 min before first meal of the day and Pepcid 20 mg one bedtime until satisfied you are better and /or cough is completely gone for at least a week without the need for cough suppression

## 2013-03-30 NOTE — Assessment & Plan Note (Signed)
Can't really explain why arb would contribute to coughing but need to keep in mind that even acei don't actually cause coughing any more than oxygen can cause a fire (by itself) - it takes a spark and something to combustible (eg a viral illness or an allergic related flare in pnds in a pt prone to gerd when coughing would be the perfect storm)  Doing fine off arb though so would probably avoid this class entirely as you are.

## 2013-03-30 NOTE — Assessment & Plan Note (Addendum)
Very strongly suspect  Classic Upper airway cough syndrome, so named because it's frequently impossible to sort out how much is  CR/sinusitis with freq throat clearing (which can be related to primary GERD)   vs  causing  secondary (" extra esophageal")  GERD from wide swings in gastric pressure that occur with throat clearing, often  promoting self use of mint and menthol lozenges that reduce the lower esophageal sphincter tone and exacerbate the problem further in a cyclical fashion.   These are the same pts (now being labeled as having "irritable larynx syndrome" by some cough centers) who not infrequently have a history of having failed to tolerate ace inhibitors as is clearly the case here   dry powder inhalers or biphosphonates or report having atypical reflux symptoms that don't respond to standard doses of PPI , and are easily confused as having aecopd or asthma flares by even experienced allergists/ pulmonologists.   For now she is no longer having any symptoms off acei and arb's (x sense of throat drainage) would rx conservatively with diet and antihistamines and gave the "action plan" to immediately start ppi in am and h2 at hs in event of flare and immediate f/u here if not better.

## 2013-04-02 ENCOUNTER — Ambulatory Visit (HOSPITAL_COMMUNITY)
Admission: RE | Admit: 2013-04-02 | Discharge: 2013-04-02 | Disposition: A | Discharge status: Home / Self Care | Payer: Medicare Other | Source: Ambulatory Visit | Attending: Family Medicine | Admitting: Family Medicine

## 2013-04-02 DIAGNOSIS — Z1231 Encounter for screening mammogram for malignant neoplasm of breast: Secondary | ICD-10-CM | POA: Insufficient documentation

## 2013-04-02 DIAGNOSIS — Z1239 Encounter for other screening for malignant neoplasm of breast: Secondary | ICD-10-CM

## 2013-04-23 ENCOUNTER — Encounter: Payer: Self-pay | Admitting: Family Medicine

## 2013-04-25 ENCOUNTER — Other Ambulatory Visit: Payer: Self-pay

## 2013-04-25 MED ORDER — AMLODIPINE BESYLATE 2.5 MG PO TABS: ORAL_TABLET | ORAL | Status: DC

## 2013-05-31 ENCOUNTER — Encounter: Payer: Medicare Other | Admitting: Family Medicine

## 2013-07-10 ENCOUNTER — Encounter: Payer: Self-pay | Admitting: Family Medicine

## 2013-07-10 ENCOUNTER — Ambulatory Visit (INDEPENDENT_AMBULATORY_CARE_PROVIDER_SITE_OTHER): Payer: Medicare Other | Admitting: Family Medicine

## 2013-07-10 VITALS — BP 122/80 | HR 66 | Resp 16 | Wt 158.4 lb

## 2013-07-10 DIAGNOSIS — E785 Hyperlipidemia, unspecified: Secondary | ICD-10-CM

## 2013-07-10 DIAGNOSIS — I10 Essential (primary) hypertension: Secondary | ICD-10-CM | POA: Diagnosis not present

## 2013-07-10 DIAGNOSIS — Z0000 Encounter for general adult medical examination without abnormal findings: Secondary | ICD-10-CM | POA: Diagnosis not present

## 2013-07-10 DIAGNOSIS — N3000 Acute cystitis without hematuria: Secondary | ICD-10-CM

## 2013-07-10 DIAGNOSIS — Z13820 Encounter for screening for osteoporosis: Secondary | ICD-10-CM

## 2013-07-10 DIAGNOSIS — E663 Overweight: Secondary | ICD-10-CM

## 2013-07-10 LAB — POCT URINALYSIS DIPSTICK
Bilirubin, UA: NEGATIVE
Blood, UA: NEGATIVE
Glucose, UA: NEGATIVE
Ketones, UA: NEGATIVE
Nitrite, UA: NEGATIVE
PH UA: 7.5
PROTEIN UA: NEGATIVE
Spec Grav, UA: 1.02
UROBILINOGEN UA: 0.2

## 2013-07-10 NOTE — Patient Instructions (Addendum)
F/u in early October, call if you need me before  It is important that you exercise regularly at least 30 minutes 5 times a week. If you develop chest pain, have severe difficulty breathing, or feel very tired, stop exercising immediately and seek medical attention  A healthy diet is rich in fruit, vegetables and whole grains. Poultry fish, nuts and beans are a healthy choice for protein rather then red meat. A low sodium diet and drinking 64 ounces of water daily is generally recommended. Oils and sweet should be limited. Carbohydrates especially for those who are diabetic or overweight, should be limited to 60-45 gram per meal. It is important to eat on a regular schedule, at least 3 times daily. Snacks should be primarily fruits, vegetables or nuts.   Weight loss goal of 13 pounds in the next 18 to 24 monhts  You need a bone density scan, referral will be entered  You should have ENT evaluate you for chronic hoarseness, let us know if and when you decide on this  Still need eye exam, you are referred to Dr Iona Hansen in [place]  Call gyne re pelvic exam  Info on end of life issues  Will be given  Continue to monitor skin as we discussed, call /send message with any concerns  Fasting lipid, chem 7, CBC end May/early June  EKG is excellent, normal ryhthm no sign of heart damage  Urinalysis will be sent for culture and you will be contacted if infection is present  Fall Prevention and Home Safety Falls cause injuries and can affect all age groups. It is possible to prevent falls.  HOW TO PREVENT FALLS  Wear shoes with rubber soles that do not have an opening for your toes.  Keep the inside and outside of your house well lit.  Use night lights throughout your home.  Remove clutter from floors.  Clean up floor spills.  Remove throw rugs or fasten them to the floor with carpet tape.  Do not place electrical cords across pathways.  Put grab bars by your tub, shower, and toilet. Do  not use towel bars as grab bars.  Put handrails on both sides of the stairway. Fix loose handrails.  Do not climb on stools or stepladders, if possible.  Do not wax your floors.  Repair uneven or unsafe sidewalks, walkways, or stairs.  Keep items you use a lot within reach.  Be aware of pets.  Keep emergency numbers next to the telephone.  Put smoke detectors in your home and near bedrooms. Ask your doctor what other things you can do to prevent falls. Document Released: 02/06/2009 Document Revised: 10/12/2011 Document Reviewed: 07/13/2011 [facility] Patient Information 2014 [place], [place].

## 2013-07-10 NOTE — Progress Notes (Signed)
Subjective:    Patient ID: Adrienne [name], female    DOB: 1946/01/07, 68 y.o.   MRN: [id number]  HPI Preventive Screening-Counseling & Management   Patient present here today for a  Welcome to Medicare  visit.   Current Problems (verified)   Medications Prior to Visit Allergies (verified)   PAST HISTORY  Family History: Negative for dementia, but Dad showed mild memeory at around age 63, when he died, Mom died at age 47, 10 sister died at age 97 , heart attack, 1 brother died of prostte cancer  Social History Never married, no children   Risk Factors  Per week, inconsistent, will change to a regular schedule with 150 min/week goal   Dietary issues discussed:low fat , heart healthy, rich in fruit and veg, carb restricted   Cardiac risk factors: none significant despite high lipids, her total /HDL ratio is excellent  Depression Screen  (Note: if answer to either of the following is "Yes", a more complete depression screening is indicated)   Over the past two weeks, have you felt down, depressed or hopeless? No  Over the past two weeks, have you felt little interest or pleasure in doing things? No  Have you lost interest or pleasure in daily life? No  Do you often feel hopeless? No  Do you cry easily over simple problems? No   Activities of Daily Living  In your present state of health, do you have any difficulty performing the following activities?  Driving?: No Managing money?: No Feeding yourself?:No Getting from bed to chair?:No Climbing a flight of stairs?:No Preparing food and eating?:No Bathing or showering?:No Getting dressed?:No Getting to the toilet?:No Using the toilet?:No Moving around from place to place?: No  Fall Risk Assessment In the past year have you fallen or had a near fall?:No Are you currently taking any medications that make you dizzy:No   Hearing Difficulties: No Do you often ask people to speak up or repeat themselves?:No Do you  experience ringing or noises in your ears?:No Do you have difficulty understanding soft or whispered voices?:No  Cognitive Testing  Alert? Yes Normal Appearance?Yes  Oriented to person? Yes Place? Yes  Time? Yes  Displays appropriate judgment?Yes  Can read the correct time from a watch face? yes Are you having problems remembering things?No  Advanced Directives have been discussed with the patient?Yes , full code, has no advanced directives, and has not given prior thought to this, information provided and discussed and pt  Encouraged to follow through on this.    List the Names of Other Physician/Practitioners you currently use: Gyne for pelvic exam will call for her appt needs to establish with opthalmology (Dr Iona Hansen intended)   Indicate any recent Medical Services you may have received from other than [facility] providers in the past year (date may be approximate).   Assessment:    Welcome to medicare exam  Plan:       Medicare Attestation  I have personally reviewed:  The patient's medical and social history  Their use of alcohol, tobacco or illicit drugs  Their current medications and supplements  The patient's functional ability including ADLs,fall risks, home safety risks, cognitive, and hearing and visual impairment  Diet and physical activities  Evidence for depression or mood disorders  The patient's weight, height, BMI, and visual acuity have been recorded in the chart. I have made referrals, counseling, and provided education to the patient based on review of the above and I have provided the patient  with a written personalized care plan for preventive services.      Review of Systems     Objective:   Physical Exam    Assesment  And plan: Welcome to Medicare preventive visit Welcome to medicare  exam as documented. Counseling done  re healthy lifestyle involving commitment to 150 minutes exercise per week, heart healthy diet, and attaining healthy weight.The  importance of adequate sleep also discussed. Advanced directives and the need to have them documented is discussed and information provided.  Changes in health habits are decided on by the patient with goals and time frames  set for achieving them. Immunization and cancer screening needs are specifically addressed at this visit. Need for eye exam stressed esp with f/h of glaucoma EKG done as none in over 4 years, and dx of HTN and dyslipidemia. Tracing shows normal sinus rythm , with no evidence of ischemia. Urinalysis is performed, due to diagnosis of hypertension for over 10 years. Negative for protein or glucose, suspiscious for infection, specimen sent  For c/s. No antibiotic treatment is started as patient denies symptoms of infection, will wait on c/s report.

## 2013-07-12 ENCOUNTER — Encounter: Payer: Self-pay | Admitting: Family Medicine

## 2013-07-12 LAB — URINE CULTURE
Colony Count: NO GROWTH
Organism ID, Bacteria: NO GROWTH

## 2013-07-17 ENCOUNTER — Ambulatory Visit (HOSPITAL_COMMUNITY)
Admission: RE | Admit: 2013-07-17 | Discharge: 2013-07-17 | Disposition: A | Discharge status: Home / Self Care | Payer: Medicare Other | Source: Ambulatory Visit | Attending: Family Medicine | Admitting: Family Medicine

## 2013-07-17 DIAGNOSIS — Z780 Asymptomatic menopausal state: Secondary | ICD-10-CM | POA: Diagnosis not present

## 2013-07-17 DIAGNOSIS — Z13820 Encounter for screening for osteoporosis: Secondary | ICD-10-CM

## 2013-07-17 DIAGNOSIS — M949 Disorder of cartilage, unspecified: Secondary | ICD-10-CM | POA: Diagnosis not present

## 2013-07-17 DIAGNOSIS — M899 Disorder of bone, unspecified: Secondary | ICD-10-CM | POA: Diagnosis not present

## 2013-07-25 ENCOUNTER — Encounter: Payer: Self-pay | Admitting: Family Medicine

## 2013-07-25 ENCOUNTER — Other Ambulatory Visit: Payer: Self-pay

## 2013-07-25 DIAGNOSIS — I10 Essential (primary) hypertension: Secondary | ICD-10-CM

## 2013-07-25 MED ORDER — TRIAMTERENE-HCTZ 37.5-25 MG PO TABS: 1.0000 | ORAL_TABLET | Freq: Every day | ORAL | Status: DC

## 2013-08-11 NOTE — Assessment & Plan Note (Addendum)
Welcome to medicare  exam as documented. Counseling done  re healthy lifestyle involving commitment to 150 minutes exercise per week, heart healthy diet, and attaining healthy weight.The importance of adequate sleep also discussed. Advanced directives and the need to have them documented is discussed and information provided.  Changes in health habits are decided on by the patient with goals and time frames  set for achieving them. Immunization and cancer screening needs are specifically addressed at this visit. Need for eye exam stressed esp with f/h of glaucoma EKG done as none in over 4 years, and dx of HTN and dyslipidemia. Tracing shows normal sinus rythm , with no evidence of ischemia. Urinalysis is performed, due to diagnosis of hypertension for over 10 years. Negative for protein or glucose, suspiscious for infection, specimen sent  For c/s. No antibiotic treatment is started as patient denies symptoms of infection, will wait on c/s report.

## 2013-10-01 ENCOUNTER — Telehealth: Payer: Self-pay

## 2013-10-03 MED ORDER — MECLIZINE HCL 12.5 MG PO TABS: 12.5000 mg | ORAL_TABLET | Freq: Three times a day (TID) | ORAL | Status: DC | PRN

## 2013-10-03 NOTE — Telephone Encounter (Signed)
Meclizine sent to [facility] pls let her know

## 2013-10-03 NOTE — Telephone Encounter (Signed)
Patient states that symptoms are now resolving but would like to know if meclizine can be sent in case the symptoms return.   She is now back to driving with no problems.  Will have ordered blood work by tomorrow and would like results if possible by Friday since she will be going out of town.

## 2013-10-04 NOTE — Telephone Encounter (Signed)
Patient aware.

## 2013-10-05 DIAGNOSIS — I10 Essential (primary) hypertension: Secondary | ICD-10-CM | POA: Diagnosis not present

## 2013-10-05 DIAGNOSIS — E785 Hyperlipidemia, unspecified: Secondary | ICD-10-CM | POA: Diagnosis not present

## 2013-10-06 LAB — BASIC METABOLIC PANEL
BUN: 14 mg/dL (ref 6–23)
CHLORIDE: 102 meq/L (ref 96–112)
CO2: 29 mEq/L (ref 19–32)
Calcium: 9.7 mg/dL (ref 8.4–10.5)
Creat: 0.78 mg/dL (ref 0.50–1.10)
GLUCOSE: 89 mg/dL (ref 70–99)
POTASSIUM: 3.8 meq/L (ref 3.5–5.3)
Sodium: 139 mEq/L (ref 135–145)

## 2013-10-06 LAB — CBC
HEMATOCRIT: 37.3 % (ref 36.0–46.0)
Hemoglobin: 12.6 g/dL (ref 12.0–15.0)
MCH: 29.5 pg (ref 26.0–34.0)
MCHC: 33.8 g/dL (ref 30.0–36.0)
MCV: 87.4 fL (ref 78.0–100.0)
Platelets: 262 10*3/uL (ref 150–400)
RBC: 4.27 MIL/uL (ref 3.87–5.11)
RDW: 14.7 % (ref 11.5–15.5)
WBC: 6.1 10*3/uL (ref 4.0–10.5)

## 2013-10-06 LAB — LIPID PANEL
CHOL/HDL RATIO: 2.2 ratio
Cholesterol: 246 mg/dL — ABNORMAL HIGH (ref 0–200)
HDL: 112 mg/dL (ref >39)
LDL Cholesterol: 122 mg/dL — ABNORMAL HIGH (ref 0–99)
Triglycerides: 58 mg/dL (ref <150)
VLDL: 12 mg/dL (ref 0–40)

## 2013-10-07 ENCOUNTER — Encounter: Payer: Self-pay | Admitting: Family Medicine

## 2013-10-11 ENCOUNTER — Telehealth: Payer: Self-pay | Admitting: Family Medicine

## 2013-10-11 NOTE — Telephone Encounter (Signed)
Pls call/ mail letter to pt re recent lab Info is in pt e mail which remains unread, her next appt is in the Fall so i sent her the e mail  ?? pls ask, thanks

## 2013-10-15 NOTE — Telephone Encounter (Signed)
Result letter mailed to patient

## 2013-10-22 ENCOUNTER — Encounter: Payer: Self-pay | Admitting: Family Medicine

## 2013-10-24 ENCOUNTER — Other Ambulatory Visit: Payer: Self-pay

## 2013-10-24 MED ORDER — AMLODIPINE BESYLATE 2.5 MG PO TABS: ORAL_TABLET | ORAL | Status: DC

## 2013-11-24 ENCOUNTER — Encounter: Payer: Self-pay | Admitting: Family Medicine

## 2013-11-26 ENCOUNTER — Other Ambulatory Visit: Payer: Self-pay

## 2013-11-26 ENCOUNTER — Telehealth: Payer: Self-pay | Admitting: Family Medicine

## 2013-11-26 DIAGNOSIS — I10 Essential (primary) hypertension: Secondary | ICD-10-CM

## 2013-11-26 MED ORDER — TRIAMTERENE-HCTZ 37.5-25 MG PO TABS: 1.0000 | ORAL_TABLET | Freq: Every day | ORAL | Status: DC

## 2013-12-05 DIAGNOSIS — H538 Other visual disturbances: Secondary | ICD-10-CM | POA: Diagnosis not present

## 2013-12-05 DIAGNOSIS — H2510 Age-related nuclear cataract, unspecified eye: Secondary | ICD-10-CM | POA: Diagnosis not present

## 2013-12-05 DIAGNOSIS — H25019 Cortical age-related cataract, unspecified eye: Secondary | ICD-10-CM | POA: Diagnosis not present

## 2013-12-19 NOTE — Telephone Encounter (Signed)
RX sent to [facility] in [place] on 11/26/2013

## 2014-01-24 ENCOUNTER — Encounter: Payer: Self-pay | Admitting: Family Medicine

## 2014-01-24 ENCOUNTER — Ambulatory Visit (INDEPENDENT_AMBULATORY_CARE_PROVIDER_SITE_OTHER): Payer: Medicare Other | Admitting: Family Medicine

## 2014-01-24 VITALS — BP 120/80 | HR 80 | Resp 16 | Ht 65.0 in | Wt 157.4 lb

## 2014-01-24 DIAGNOSIS — Z83511 Family history of glaucoma: Secondary | ICD-10-CM

## 2014-01-24 DIAGNOSIS — I10 Essential (primary) hypertension: Secondary | ICD-10-CM

## 2014-01-24 DIAGNOSIS — R05 Cough: Secondary | ICD-10-CM | POA: Diagnosis not present

## 2014-01-24 DIAGNOSIS — Z800 Family history of malignant neoplasm of digestive organs: Secondary | ICD-10-CM | POA: Diagnosis not present

## 2014-01-24 DIAGNOSIS — E785 Hyperlipidemia, unspecified: Secondary | ICD-10-CM | POA: Diagnosis not present

## 2014-01-24 DIAGNOSIS — Z23 Encounter for immunization: Secondary | ICD-10-CM | POA: Diagnosis not present

## 2014-01-24 DIAGNOSIS — R053 Chronic cough: Secondary | ICD-10-CM

## 2014-01-24 MED ORDER — AMLODIPINE BESYLATE 2.5 MG PO TABS: ORAL_TABLET | ORAL | Status: DC

## 2014-01-24 NOTE — Patient Instructions (Addendum)
F/u in 4.5 month, call if you need me before  Blood pressure is excellent , no med change  With the "current thinking" I recommend taking calcium in the form of diet rather than tablets,  daily about 1000mg , NO CLEAR data currently and will likely continue to change  Fl;u vaccine today  Vegetable fruit, natural herbs and spices have NO fAT/cholesterol  Egg white and beans of any sort are a good source of protein and fiber  Keep up good health habits  Flu vaccine today  Fasting lipid, cmp, TSH in 4.5 month

## 2014-01-26 DIAGNOSIS — Z800 Family history of malignant neoplasm of digestive organs: Secondary | ICD-10-CM | POA: Insufficient documentation

## 2014-01-26 DIAGNOSIS — Z23 Encounter for immunization: Secondary | ICD-10-CM | POA: Insufficient documentation

## 2014-01-26 NOTE — Assessment & Plan Note (Signed)
Controlled, no change in medication DASH diet and commitment to daily physical activity for a minimum of 30 minutes discussed and encouraged, as a part of hypertension management. The importance of attaining a healthy weight is also discussed.  

## 2014-01-26 NOTE — Assessment & Plan Note (Signed)
rpeorts normal exam since last visit

## 2014-01-26 NOTE — Assessment & Plan Note (Signed)
Hyperlipidemia:Low fat diet discussed and encouraged.  Updated lab needed at/ before next visit.  

## 2014-01-26 NOTE — Assessment & Plan Note (Signed)
Niece recently diagnosed with pancreatic cancer. Pt has no c/o abdominal pain or weight loss, and feels well. I advised I am unaware of any "csreen/test" to be done as far as she is concerned, wll also send a message to her GI Doc for an opinion, if different this will be followed up on with the pt States she understands use of "raw" vinegar boosts immune system reducing likelihood, and she has been doing this for years. I am unaware myself of scientific data to back this but have not discouraged her as this is natural food, not in tablet form

## 2014-01-26 NOTE — Progress Notes (Signed)
   Subjective:    Patient ID: Adrienne [name], female    DOB: [birth year], 68 y.o.   MRN: [id number]  HPI The PT is here for follow up and re-evaluation of chronic medical conditions, medication management and review of any available recent lab and radiology data.  Preventive health is updated, specifically  Cancer screening and Immunization.   Questions or concerns regarding consultations or procedures which the PT has had in the interim are  Addressed.Has had eye exam since last visit, reports no sign of glaucoma just very small cataract, which is not new The PT denies any adverse reactions to current medications since the last visit.  Niece diagnosed with and being treated for pancreatic cancer, wants this noted, also has questions re value of calcium supplementation for bone health, I recommend dietary calcium. There are no specific complaints       Review of Systems See HPI Denies recent fever or chills. Denies sinus pressure, nasal congestion, ear pain or sore throat. Denies chest congestion, productive cough or wheezing. Denies chest pains, palpitations and leg swelling Denies abdominal pain, nausea, vomiting,diarrhea or constipation.   Denies dysuria, frequency, hesitancy or incontinence. Denies joint pain, swelling and limitation in mobility. Denies headaches, seizures, numbness, or tingling. Denies depression, anxiety or insomnia. Denies skin break down or rash.        Objective:   Physical Exam BP 120/80  Pulse 80  Resp 16  Ht 5\' 5"  (1.651 m)  Wt 157 lb 6.4 oz (71.396 kg)  BMI 26.19 kg/m2  SpO2 99% Patient alert and oriented and in no cardiopulmonary distress.  HEENT: No facial asymmetry, EOMI,   oropharynx pink and moist.  Neck supple no JVD, no mass.  Chest: Clear to auscultation bilaterally.  CVS: S1, S2 no murmurs, no S3.Regular rate.  ABD: Soft non tender.   Ext: No edema  MS: Adequate ROM spine, shoulders, hips and knees.  Skin: Intact, no  ulcerations or rash noted.  Psych: Good eye contact, normal affect. Memory intact not anxious or depressed appearing.  CNS: CN 2-12 intact, power,  normal throughout.no focal deficits noted.        Assessment & Plan:  Essential hypertension Controlled, no change in medication DASH diet and commitment to daily physical activity for a minimum of 30 minutes discussed and encouraged, as a part of hypertension management. The importance of attaining a healthy weight is also discussed.   Hyperlipidemia LDL goal <100 Hyperlipidemia:Low fat diet discussed and encouraged.  Updated lab needed at/ before next visit.   Chronic cough Improved withchange in bP medication as well as regular use of allergy med  FH: pancreatic cancer Niece recently diagnosed with pancreatic cancer. Pt has no c/o abdominal pain or weight loss, and feels well. I advised I am unaware of any "csreen/test" to be done as far as she is concerned, wll also send a message to her GI Doc for an opinion, if different this will be followed up on with the pt States she understands use of "raw" vinegar boosts immune system reducing likelihood, and she has been doing this for years. I am unaware myself of scientific data to back this but have not discouraged her as this is natural food, not in tablet form  FH: glaucoma rpeorts normal exam since last visit  Need for prophylactic vaccination and inoculation against influenza Vaccine administered at visit.

## 2014-01-26 NOTE — Assessment & Plan Note (Signed)
Vaccine administered at visit.  

## 2014-01-26 NOTE — Assessment & Plan Note (Signed)
Improved withchange in bP medication as well as regular use of allergy med

## 2014-03-29 ENCOUNTER — Other Ambulatory Visit: Payer: Self-pay

## 2014-03-29 ENCOUNTER — Telehealth: Payer: Self-pay | Admitting: Family Medicine

## 2014-03-29 DIAGNOSIS — I10 Essential (primary) hypertension: Secondary | ICD-10-CM

## 2014-03-29 MED ORDER — TRIAMTERENE-HCTZ 37.5-25 MG PO TABS: 1.0000 | ORAL_TABLET | Freq: Every day | ORAL | Status: DC

## 2014-03-29 NOTE — Telephone Encounter (Signed)
Medication refilled to [facility]

## 2014-04-23 ENCOUNTER — Telehealth: Payer: Self-pay | Admitting: *Deleted

## 2014-04-23 MED ORDER — AMLODIPINE BESYLATE 2.5 MG PO TABS: ORAL_TABLET | ORAL | Status: DC

## 2014-04-23 NOTE — Telephone Encounter (Signed)
Pt called stating she needs a refill on her lodpine 2.5mg . Please advise

## 2014-04-23 NOTE — Telephone Encounter (Signed)
Med sent.

## 2014-05-23 ENCOUNTER — Encounter: Payer: Self-pay | Admitting: Gastroenterology

## 2014-06-06 DIAGNOSIS — E785 Hyperlipidemia, unspecified: Secondary | ICD-10-CM | POA: Diagnosis not present

## 2014-06-06 DIAGNOSIS — I10 Essential (primary) hypertension: Secondary | ICD-10-CM | POA: Diagnosis not present

## 2014-06-07 ENCOUNTER — Encounter: Payer: Self-pay | Admitting: Family Medicine

## 2014-06-07 LAB — COMPREHENSIVE METABOLIC PANEL
ALK PHOS: 83 U/L (ref 39–117)
ALT: 17 U/L (ref 0–35)
AST: 19 U/L (ref 0–37)
Albumin: 3.8 g/dL (ref 3.5–5.2)
BUN: 11 mg/dL (ref 6–23)
CO2: 28 mEq/L (ref 19–32)
Calcium: 9.8 mg/dL (ref 8.4–10.5)
Chloride: 103 mEq/L (ref 96–112)
Creat: 0.8 mg/dL (ref 0.50–1.10)
GLUCOSE: 78 mg/dL (ref 70–99)
Potassium: 4.1 mEq/L (ref 3.5–5.3)
SODIUM: 140 meq/L (ref 135–145)
Total Bilirubin: 0.7 mg/dL (ref 0.2–1.2)
Total Protein: 6.7 g/dL (ref 6.0–8.3)

## 2014-06-07 LAB — LIPID PANEL
CHOL/HDL RATIO: 2.3 ratio
CHOLESTEROL: 252 mg/dL — AB (ref 0–200)
HDL: 108 mg/dL (ref >39)
LDL Cholesterol: 130 mg/dL — ABNORMAL HIGH (ref 0–99)
Triglycerides: 69 mg/dL (ref <150)
VLDL: 14 mg/dL (ref 0–40)

## 2014-06-07 LAB — TSH: TSH: 1.263 u[IU]/mL (ref 0.350–4.500)

## 2014-06-10 ENCOUNTER — Ambulatory Visit: Payer: Medicare Other | Admitting: Family Medicine

## 2014-06-27 ENCOUNTER — Other Ambulatory Visit: Payer: Self-pay | Admitting: Family Medicine

## 2014-06-27 DIAGNOSIS — Z1231 Encounter for screening mammogram for malignant neoplasm of breast: Secondary | ICD-10-CM

## 2014-07-01 ENCOUNTER — Ambulatory Visit (HOSPITAL_COMMUNITY)
Admission: RE | Admit: 2014-07-01 | Discharge: 2014-07-01 | Disposition: A | Discharge status: Home / Self Care | Payer: Medicare Other | Source: Ambulatory Visit | Attending: Family Medicine | Admitting: Family Medicine

## 2014-07-01 ENCOUNTER — Other Ambulatory Visit: Payer: Self-pay | Admitting: Family Medicine

## 2014-07-01 DIAGNOSIS — N6320 Unspecified lump in the left breast, unspecified quadrant: Secondary | ICD-10-CM

## 2014-07-01 DIAGNOSIS — Z1231 Encounter for screening mammogram for malignant neoplasm of breast: Secondary | ICD-10-CM

## 2014-07-03 ENCOUNTER — Ambulatory Visit (INDEPENDENT_AMBULATORY_CARE_PROVIDER_SITE_OTHER): Payer: Medicare Other | Admitting: Family Medicine

## 2014-07-03 ENCOUNTER — Encounter: Payer: Self-pay | Admitting: Family Medicine

## 2014-07-03 VITALS — BP 116/64 | HR 64 | Resp 18 | Ht 65.0 in | Wt 160.0 lb

## 2014-07-03 DIAGNOSIS — R142 Eructation: Secondary | ICD-10-CM

## 2014-07-03 DIAGNOSIS — E785 Hyperlipidemia, unspecified: Secondary | ICD-10-CM

## 2014-07-03 DIAGNOSIS — M79644 Pain in right finger(s): Secondary | ICD-10-CM | POA: Diagnosis not present

## 2014-07-03 DIAGNOSIS — Z86010 Personal history of colonic polyps: Secondary | ICD-10-CM

## 2014-07-03 DIAGNOSIS — L659 Nonscarring hair loss, unspecified: Secondary | ICD-10-CM | POA: Diagnosis not present

## 2014-07-03 DIAGNOSIS — I10 Essential (primary) hypertension: Secondary | ICD-10-CM

## 2014-07-03 DIAGNOSIS — Z23 Encounter for immunization: Secondary | ICD-10-CM | POA: Diagnosis not present

## 2014-07-03 DIAGNOSIS — Z800 Family history of malignant neoplasm of digestive organs: Secondary | ICD-10-CM

## 2014-07-03 DIAGNOSIS — K5730 Diverticulosis of large intestine without perforation or abscess without bleeding: Secondary | ICD-10-CM

## 2014-07-03 NOTE — Patient Instructions (Addendum)
Annual wellness in  5.5 months, call if you need me before  You will be referred for right thumb, pain  And hair loss as dicussed  Pls cut back significantly on meat, nuts  As your cholesterol is too high  Continue to commit to regular exercise   You will be referred to new  GI doc for colonoscopy  Prevnar  Today  Fasting lipid, chem 7, CBC and TSH in 5.5 month

## 2014-07-03 NOTE — Progress Notes (Signed)
Subjective:    Patient ID: Adrienne [name], female    DOB: Mar 03, 1946, 69 y.o.   MRN: [id number]  HPI  The PT is here for follow up and re-evaluation of chronic medical conditions, medication management and review of any available recent lab and radiology data.  Preventive health is updated, specifically  Cancer screening and Immunization.   Questions or concerns regarding consultations or procedures which the PT has had in the interim are  addressed. The PT denies any adverse reactions to current medications since the last visit.  3 month h/o right thumb stiffness, deformity and inability to use Nail color changes noted diffusely in toenalils and streaking in fingers nails in past several years. 1 year Hair loss  Progresive, wants help, esp at crown of head. Recently got a letter form her GI Doc for follow up, however wants to see another GI Doc in [place], uncertain that GI eval is clearly indiucated at this time, her colonoscopy last was normal noted only to have diverticulosis and small internal hemorrhoids She does have a personal h/o colon polyps in the past however , and last note from GI stated colonoscopy rept in 5 to 10 years. She has no new c/o change in stool caliber , weight loss or abdominal pain Of recent concern however , a niece of hers was diagnosed with pancreatic cancer , and she had voiced his as a concern in the past She remains concerned about her lipids , which remain elevated with her new 'less healthy diet " since returning to [place] from [place], intends to correct this    Review of Systems See HPI Denies recent fever or chills. Denies sinus pressure, nasal congestion, ear pain or sore throat. Denies chest congestion, productive cough or wheezing. Denies chest pains, palpitations and leg swelling Denies abdominal pain, nausea, vomiting,diarrhea or constipation.   Denies dysuria, frequency, hesitancy or incontinence. . Denies headaches, seizures, numbness, or  tingling. Denies depression, anxiety or insomnia.       Objective:   Physical Exam BP 116/64 mmHg  Pulse 64  Resp 18  Ht 5\' 5"  (1.651 m)  Wt 160 lb 0.6 oz (72.594 kg)  BMI 26.63 kg/m2  SpO2 95% Patient alert and oriented and in no cardiopulmonary distress.  HEENT: No facial asymmetry, EOMI,   oropharynx pink and moist.  Neck supple no JVD, no mass.  Chest: Clear to auscultation bilaterally.  CVS: S1, S2 no murmurs, no S3.Regular rate.  ABD: Soft non tender.   Ext: No edema  MS: Adequate ROM spine, shoulders, hips and knees.Decreased ROM with deformity in joint of  Right  thumb  Skin: Intact, no ulcerations or rash noted.hyperpigmentation noted on toe  Nails, hair loss esp on crown of head noted  Psych: Good eye contact, normal affect. Memory intact not anxious or depressed appearing.  CNS: CN 2-12 intact, power,  normal throughout.no focal deficits noted.        Assessment & Plan:  Essential hypertension Controlled, no change in medication DASH diet and commitment to daily physical activity for a minimum of 30 minutes discussed and encouraged, as a part of hypertension management. The importance of attaining a healthy weight is also discussed.  BP/Weight 07/03/2014 01/24/2014 07/10/2013 03/28/2013 03/20/2013 09/12/2012 01/60/1093  Systolic BP 235 573 220 254 270 623 762  Diastolic BP 64 80 80 84 80 78 80  Wt. (Lbs) 160.04 157.4 158.4 160 159 157 157  BMI 26.63 26.19 26.36 26.63 26.46 26.13 25.97    CMP  Latest Ref Rng 06/06/2014 10/05/2013 03/09/2013  Glucose 70 - 99 mg/dL 78 89 79  BUN 6 - 23 mg/dL 11 14 14   Creatinine 0.50 - 1.10 mg/dL 0.80 0.78 0.71  Sodium 135 - 145 mEq/L 140 139 140  Potassium 3.5 - 5.3 mEq/L 4.1 3.8 4.1  Chloride 96 - 112 mEq/L 103 102 101  CO2 19 - 32 mEq/L 28 29 30   Calcium 8.4 - 10.5 mg/dL 9.8 9.7 9.9  Total Protein 6.0 - 8.3 g/dL 6.7 - -  Total Bilirubin 0.2 - 1.2 mg/dL 0.7 - -  Alkaline Phos 39 - 117 U/L 83 - -  AST 0 - 37 U/L 19 - -    ALT 0 - 35 U/L 17 - -        Diverticulosis of colon without hemorrhage Pt has a past h/o colon polyps, however her most recent colonoscopy in 2011 by Dr Oneida Alar showed no polyps. She has diverticulosis as wellas small internal hemorrhoids per the report.The colonoscpoy then recommended a 5 to 10 year follow up study, pt has with her a letter calling her in for GI re eval, but she is opting to see a different GI Doc in [facility] Pt has also been evaluated by GI for nausea and bloating , since that time, which are not major current concerns Also has a a niece recently diagnosed with pancreatic cancer and she has expressed concern about any possible need for testing for this. I did send her current GI Doc , Fields a message with the question and her response was , no screening test recommended , which i conveyed to [place] at the time ith ehr several GI issues, I feel referral to establish with the new Doc is best, and at that time the GI Doc will determine the need for tests etc needed and when   Hyperlipidemia LDL goal <100 Deteriorated Hyperlipidemia:Low fat diet discussed and encouraged.  CMP Latest Ref Rng 06/06/2014 10/05/2013 03/09/2013  Glucose 70 - 99 mg/dL 78 89 79  BUN 6 - 23 mg/dL 11 14 14   Creatinine 0.50 - 1.10 mg/dL 0.80 0.78 0.71  Sodium 135 - 145 mEq/L 140 139 140  Potassium 3.5 - 5.3 mEq/L 4.1 3.8 4.1  Chloride 96 - 112 mEq/L 103 102 101  CO2 19 - 32 mEq/L 28 29 30   Calcium 8.4 - 10.5 mg/dL 9.8 9.7 9.9  Total Protein 6.0 - 8.3 g/dL 6.7 - -  Total Bilirubin 0.2 - 1.2 mg/dL 0.7 - -  Alkaline Phos 39 - 117 U/L 83 - -  AST 0 - 37 U/L 19 - -  ALT 0 - 35 U/L 17 - -    Lipid Panel     Component Value Date/Time   CHOL 252* 06/06/2014 1217   TRIG 69 06/06/2014 1217   HDL 108 06/06/2014 1217   CHOLHDL 2.3 06/06/2014 1217   VLDL 14 06/06/2014 1217   LDLCALC 130* 06/06/2014 1217         Need for vaccination with 13-polyvalent pneumococcal conjugate  vaccine After obtaining informed consent, the vaccine is  administered by LPN.    Chronic pain of right thumb Progressively worsening deformity and stiffness , with limitation in mobility, orhto hand surgeon to eval and treat, referral entered   Alopecia Progressive thinning and loss of hair, esp on crown of head noticed, dermatology to eval and treat. Of note, pt puts no chemicals in her hair

## 2014-07-05 ENCOUNTER — Encounter: Payer: Self-pay | Admitting: Family Medicine

## 2014-07-05 DIAGNOSIS — K5730 Diverticulosis of large intestine without perforation or abscess without bleeding: Secondary | ICD-10-CM | POA: Insufficient documentation

## 2014-07-09 ENCOUNTER — Ambulatory Visit (HOSPITAL_COMMUNITY)
Admission: RE | Admit: 2014-07-09 | Discharge: 2014-07-09 | Disposition: A | Discharge status: Home / Self Care | Payer: Medicare Other | Source: Ambulatory Visit | Attending: Family Medicine | Admitting: Family Medicine

## 2014-07-09 DIAGNOSIS — Z1231 Encounter for screening mammogram for malignant neoplasm of breast: Secondary | ICD-10-CM | POA: Insufficient documentation

## 2014-07-09 DIAGNOSIS — N6320 Unspecified lump in the left breast, unspecified quadrant: Secondary | ICD-10-CM

## 2014-07-18 DIAGNOSIS — L708 Other acne: Secondary | ICD-10-CM | POA: Diagnosis not present

## 2014-07-18 DIAGNOSIS — L918 Other hypertrophic disorders of the skin: Secondary | ICD-10-CM | POA: Diagnosis not present

## 2014-07-18 DIAGNOSIS — L659 Nonscarring hair loss, unspecified: Secondary | ICD-10-CM | POA: Diagnosis not present

## 2014-07-18 DIAGNOSIS — L219 Seborrheic dermatitis, unspecified: Secondary | ICD-10-CM | POA: Diagnosis not present

## 2014-07-21 DIAGNOSIS — Z23 Encounter for immunization: Secondary | ICD-10-CM | POA: Insufficient documentation

## 2014-07-21 DIAGNOSIS — G8929 Other chronic pain: Secondary | ICD-10-CM | POA: Insufficient documentation

## 2014-07-21 DIAGNOSIS — M79644 Pain in right finger(s): Secondary | ICD-10-CM

## 2014-07-21 DIAGNOSIS — L659 Nonscarring hair loss, unspecified: Secondary | ICD-10-CM | POA: Insufficient documentation

## 2014-07-21 NOTE — Assessment & Plan Note (Signed)
Progressively worsening deformity and stiffness , with limitation in mobility, orhto hand surgeon to eval and treat, referral entered

## 2014-07-21 NOTE — Assessment & Plan Note (Signed)
Deteriorated Hyperlipidemia:Low fat diet discussed and encouraged.  CMP Latest Ref Rng 06/06/2014 10/05/2013 03/09/2013  Glucose 70 - 99 mg/dL 78 89 79  BUN 6 - 23 mg/dL 11 14 14   Creatinine 0.50 - 1.10 mg/dL 0.80 0.78 0.71  Sodium 135 - 145 mEq/L 140 139 140  Potassium 3.5 - 5.3 mEq/L 4.1 3.8 4.1  Chloride 96 - 112 mEq/L 103 102 101  CO2 19 - 32 mEq/L 28 29 30   Calcium 8.4 - 10.5 mg/dL 9.8 9.7 9.9  Total Protein 6.0 - 8.3 g/dL 6.7 - -  Total Bilirubin 0.2 - 1.2 mg/dL 0.7 - -  Alkaline Phos 39 - 117 U/L 83 - -  AST 0 - 37 U/L 19 - -  ALT 0 - 35 U/L 17 - -    Lipid Panel     Component Value Date/Time   CHOL 252* 06/06/2014 1217   TRIG 69 06/06/2014 1217   HDL 108 06/06/2014 1217   CHOLHDL 2.3 06/06/2014 1217   VLDL 14 06/06/2014 1217   LDLCALC 130* 06/06/2014 1217

## 2014-07-21 NOTE — Assessment & Plan Note (Signed)
Progressive thinning and loss of hair, esp on crown of head noticed, dermatology to eval and treat. Of note, pt puts no chemicals in her hair

## 2014-07-21 NOTE — Assessment & Plan Note (Signed)
Controlled, no change in medication DASH diet and commitment to daily physical activity for a minimum of 30 minutes discussed and encouraged, as a part of hypertension management. The importance of attaining a healthy weight is also discussed.  BP/Weight 07/03/2014 01/24/2014 07/10/2013 03/28/2013 03/20/2013 09/12/2012 17/61/6073  Systolic BP 710 626 948 546 270 350 093  Diastolic BP 64 80 80 84 80 78 80  Wt. (Lbs) 160.04 157.4 158.4 160 159 157 157  BMI 26.63 26.19 26.36 26.63 26.46 26.13 25.97    CMP Latest Ref Rng 06/06/2014 10/05/2013 03/09/2013  Glucose 70 - 99 mg/dL 78 89 79  BUN 6 - 23 mg/dL 11 14 14   Creatinine 0.50 - 1.10 mg/dL 0.80 0.78 0.71  Sodium 135 - 145 mEq/L 140 139 140  Potassium 3.5 - 5.3 mEq/L 4.1 3.8 4.1  Chloride 96 - 112 mEq/L 103 102 101  CO2 19 - 32 mEq/L 28 29 30   Calcium 8.4 - 10.5 mg/dL 9.8 9.7 9.9  Total Protein 6.0 - 8.3 g/dL 6.7 - -  Total Bilirubin 0.2 - 1.2 mg/dL 0.7 - -  Alkaline Phos 39 - 117 U/L 83 - -  AST 0 - 37 U/L 19 - -  ALT 0 - 35 U/L 17 - -

## 2014-07-21 NOTE — Assessment & Plan Note (Signed)
After obtaining informed consent, the vaccine is  administered by LPN.  

## 2014-07-21 NOTE — Assessment & Plan Note (Signed)
Pt has a past h/o colon polyps, however her most recent colonoscopy in 2011 by Dr Oneida Alar showed no polyps. She has diverticulosis as wellas small internal hemorrhoids per the report.The colonoscpoy then recommended a 5 to 10 year follow up study, pt has with her a letter calling her in for GI re eval, but she is opting to see a different GI Doc in [facility] Pt has also been evaluated by GI for nausea and bloating , since that time, which are not major current concerns Also has a a niece recently diagnosed with pancreatic cancer and she has expressed concern about any possible need for testing for this. I did send her current GI Doc , Fields a message with the question and her response was , no screening test recommended , which i conveyed to [place] at the time ith ehr several GI issues, I feel referral to establish with the new Doc is best, and at that time the GI Doc will determine the need for tests etc needed and when

## 2014-07-31 ENCOUNTER — Other Ambulatory Visit: Payer: Self-pay | Admitting: Family Medicine

## 2014-08-19 DIAGNOSIS — M79644 Pain in right finger(s): Secondary | ICD-10-CM | POA: Diagnosis not present

## 2014-12-26 ENCOUNTER — Encounter: Payer: Self-pay | Admitting: Family Medicine

## 2014-12-26 ENCOUNTER — Ambulatory Visit (INDEPENDENT_AMBULATORY_CARE_PROVIDER_SITE_OTHER): Payer: Medicare Other | Admitting: Family Medicine

## 2014-12-26 VITALS — BP 136/74 | HR 76 | Resp 18 | Ht 65.0 in | Wt 161.0 lb

## 2014-12-26 DIAGNOSIS — E785 Hyperlipidemia, unspecified: Secondary | ICD-10-CM

## 2014-12-26 DIAGNOSIS — R0789 Other chest pain: Secondary | ICD-10-CM

## 2014-12-26 DIAGNOSIS — K219 Gastro-esophageal reflux disease without esophagitis: Secondary | ICD-10-CM | POA: Insufficient documentation

## 2014-12-26 DIAGNOSIS — R05 Cough: Secondary | ICD-10-CM

## 2014-12-26 DIAGNOSIS — I10 Essential (primary) hypertension: Secondary | ICD-10-CM | POA: Diagnosis not present

## 2014-12-26 DIAGNOSIS — R059 Cough, unspecified: Secondary | ICD-10-CM

## 2014-12-26 DIAGNOSIS — J309 Allergic rhinitis, unspecified: Secondary | ICD-10-CM | POA: Insufficient documentation

## 2014-12-26 MED ORDER — AZELASTINE HCL 0.1 % NA SOLN: 2.0000 | Freq: Two times a day (BID) | NASAL | Status: DC

## 2014-12-26 MED ORDER — PREDNISONE 5 MG (21) PO TBPK: 5.0000 mg | ORAL_TABLET | ORAL | Status: DC

## 2014-12-26 MED ORDER — PROMETHAZINE-DM 6.25-15 MG/5ML PO SYRP: ORAL_SOLUTION | ORAL | Status: DC

## 2014-12-26 MED ORDER — RANITIDINE HCL 150 MG PO CAPS: 150.0000 mg | ORAL_CAPSULE | Freq: Every evening | ORAL | Status: DC

## 2014-12-26 MED ORDER — OMEPRAZOLE 20 MG PO CPDR: 20.0000 mg | DELAYED_RELEASE_CAPSULE | Freq: Every day | ORAL | Status: DC

## 2014-12-26 NOTE — Progress Notes (Signed)
   Subjective:    Patient ID: Adrienne [name], female    DOB: 03-Oct-1945, 69 y.o.   MRN: [id number]  HPI 1 week h/o head and chest congestion, and hoarseness , with recent trip to [place] where she has had severe allergies in the past  No fever or chills  Dry cough, clear nasal drainage, slightly sore throat, no ear pain  Right sided chest pain when she swallows solid and liquid   Review of Systems See HPI Denies recent fever or chills.  Denies PND, orthopnea, palpitations and leg swelling Denies abdominal pain, nausea, vomiting,diarrhea or constipation.   Denies dysuria, frequency, hesitancy or incontinence. Denies joint pain, swelling and limitation in mobility. Denies headaches, seizures, numbness, or tingling. Denies depression, anxiety or insomnia. Denies skin break down or rash.        Objective:   Physical Exam BP 136/74 mmHg  Pulse 76  Resp 18  Ht 5\' 5"  (1.651 m)  Wt 161 lb (73.029 kg)  BMI 26.79 kg/m2  SpO2 96% Patient alert and oriented and in no cardiopulmonary distress.  HEENT: No facial asymmetry, EOMI,   oropharynx pink and moist.  Neck supple no JVD, no mass.Erythema and edema of nasal mucosa, TM clear  Chest: Clear to auscultation bilaterally.  CVS: S1, S2 no murmurs, no S3.Regular rate.  ABD: Soft non tender.   Ext: No edema  MS: Adequate ROM spine, shoulders, hips and knees.  Skin: Intact, no ulcerations or rash noted.  Psych: Good eye contact, normal affect. Memory intact not anxious or depressed appearing.  CNS: CN 2-12 intact, power,  normal throughout.no focal deficits noted.        Assessment & Plan:  Allergic sinusitis Uncontrolled , additional medications to be started, and short course of prednisone with cough suppressant  Cough Allergy triggered, control of upper airway symptoms will reduce cough, short term use of cough suppressant T Re introduction of GERd medications also as had been advised in the past by GI  GERD  (gastroesophageal reflux disease) Uncontrolled, resume PPI and H2 blocker as previously recommended, pt now c/o dysphagia, mild, hence GERD inadequately treated. Will call back if choking/ sticking sensation continues  Essential hypertension Controlled, no change in medication DASH diet and commitment to daily physical activity for a minimum of 30 minutes discussed and encouraged, as a part of hypertension management. The importance of attaining a healthy weight is also discussed.  BP/Weight 12/26/2014 07/03/2014 01/24/2014 07/10/2013 03/28/2013 03/20/2013 2/67/1245  Systolic BP 809 983 382 505 397 673 419  Diastolic BP 74 64 80 80 84 80 78  Wt. (Lbs) 161 160.04 157.4 158.4 160 159 157  BMI 26.79 26.63 26.19 26.36 26.63 26.46 26.13        Other chest pain New onset chest discomfort, substernal primarily , aggravated by coughing. EKG in office unchanged from prior, no ischemic changes , NSR  Hyperlipidemia LDL goal <100 Hyperlipidemia:Low fat diet discussed and encouraged.   Lipid Panel  Lab Results  Component Value Date   CHOL 252* 06/06/2014   HDL 108 06/06/2014   LDLCALC 130* 06/06/2014   TRIG 69 06/06/2014   CHOLHDL 2.3 06/06/2014   Uncontrolled, needs to follow diet

## 2014-12-26 NOTE — Patient Instructions (Signed)
F/u as before  You are treated for uncontrolled allergy symptoms causing cough and hoarseness Medication is also sent in for reflux since pain is occuring with swallowing, prilosec in the day and zantac at bedtime  Your EKG is UNCHANGED from before.No indication that this chest pain is from heart damamge  Please call if symptoms persist or worsen

## 2015-01-08 ENCOUNTER — Encounter: Payer: Medicare Other | Admitting: Family Medicine

## 2015-01-15 ENCOUNTER — Encounter: Payer: Self-pay | Admitting: Family Medicine

## 2015-01-15 ENCOUNTER — Encounter: Payer: Medicare Other | Admitting: Family Medicine

## 2015-01-19 DIAGNOSIS — R0789 Other chest pain: Secondary | ICD-10-CM | POA: Insufficient documentation

## 2015-01-19 NOTE — Assessment & Plan Note (Signed)
Allergy triggered, control of upper airway symptoms will reduce cough, short term use of cough suppressant T Re introduction of GERd medications also as had been advised in the past by GI

## 2015-01-19 NOTE — Assessment & Plan Note (Signed)
Uncontrolled, resume PPI and H2 blocker as previously recommended, pt now c/o dysphagia, mild, hence GERD inadequately treated. Will call back if choking/ sticking sensation continues

## 2015-01-19 NOTE — Assessment & Plan Note (Signed)
New onset chest discomfort, substernal primarily , aggravated by coughing. EKG in office unchanged from prior, no ischemic changes , NSR

## 2015-01-19 NOTE — Assessment & Plan Note (Signed)
Controlled, no change in medication DASH diet and commitment to daily physical activity for a minimum of 30 minutes discussed and encouraged, as a part of hypertension management. The importance of attaining a healthy weight is also discussed.  BP/Weight 12/26/2014 07/03/2014 01/24/2014 07/10/2013 03/28/2013 03/20/2013 11/12/7216  Systolic BP 288 337 445 146 047 998 721  Diastolic BP 74 64 80 80 84 80 78  Wt. (Lbs) 161 160.04 157.4 158.4 160 159 157  BMI 26.79 26.63 26.19 26.36 26.63 26.46 26.13

## 2015-01-19 NOTE — Assessment & Plan Note (Signed)
Uncontrolled , additional medications to be started, and short course of prednisone with cough suppressant

## 2015-01-19 NOTE — Assessment & Plan Note (Signed)
Hyperlipidemia:Low fat diet discussed and encouraged.   Lipid Panel  Lab Results  Component Value Date   CHOL 252* 06/06/2014   HDL 108 06/06/2014   LDLCALC 130* 06/06/2014   TRIG 69 06/06/2014   CHOLHDL 2.3 06/06/2014   Uncontrolled, needs to follow diet

## 2015-01-22 ENCOUNTER — Ambulatory Visit: Payer: Medicare Other | Admitting: Family Medicine

## 2015-01-31 ENCOUNTER — Other Ambulatory Visit: Payer: Self-pay | Admitting: Family Medicine

## 2015-02-24 ENCOUNTER — Encounter: Payer: Self-pay | Admitting: Family Medicine

## 2015-02-24 ENCOUNTER — Ambulatory Visit (INDEPENDENT_AMBULATORY_CARE_PROVIDER_SITE_OTHER): Payer: Medicare Other | Admitting: Family Medicine

## 2015-02-24 VITALS — BP 126/68 | HR 60 | Resp 18 | Wt 160.0 lb

## 2015-02-24 DIAGNOSIS — I10 Essential (primary) hypertension: Secondary | ICD-10-CM | POA: Diagnosis not present

## 2015-02-24 DIAGNOSIS — M542 Cervicalgia: Secondary | ICD-10-CM | POA: Diagnosis not present

## 2015-02-24 DIAGNOSIS — Z23 Encounter for immunization: Secondary | ICD-10-CM

## 2015-02-24 DIAGNOSIS — E785 Hyperlipidemia, unspecified: Secondary | ICD-10-CM

## 2015-02-24 DIAGNOSIS — R296 Repeated falls: Secondary | ICD-10-CM

## 2015-02-24 DIAGNOSIS — Z1159 Encounter for screening for other viral diseases: Secondary | ICD-10-CM

## 2015-02-24 DIAGNOSIS — E663 Overweight: Secondary | ICD-10-CM

## 2015-02-24 DIAGNOSIS — R269 Unspecified abnormalities of gait and mobility: Secondary | ICD-10-CM

## 2015-02-24 DIAGNOSIS — M25511 Pain in right shoulder: Secondary | ICD-10-CM

## 2015-02-24 DIAGNOSIS — R29898 Other symptoms and signs involving the musculoskeletal system: Secondary | ICD-10-CM

## 2015-02-24 DIAGNOSIS — M25512 Pain in left shoulder: Secondary | ICD-10-CM

## 2015-02-24 NOTE — Patient Instructions (Addendum)
Annual wellness in 5 month, call if you need me before  X ray of neck today  You will be referred to ortho Doc re falls and joint pains  Labs non fast today  Fasting labs fotr 5 month visit  Thanks for choosing [facility], we consider it a privelige to serve you.  Flu vac today  Fall Prevention in the Home  Falls can cause injuries. They can happen to people of all ages. There are many things you can do to make your home safe and to help prevent falls.  WHAT CAN I DO ON THE OUTSIDE OF MY HOME?  Regularly fix the edges of walkways and driveways and fix any cracks.  Remove anything that might make you trip as you walk through a door, such as a raised step or threshold.  Trim any bushes or trees on the path to your home.  Use bright outdoor lighting.  Clear any walking paths of anything that might make someone trip, such as rocks or tools.  Regularly check to see if handrails are loose or broken. Make sure that both sides of any steps have handrails.  Any raised decks and porches should have guardrails on the edges.  Have any leaves, snow, or ice cleared regularly.  Use sand or salt on walking paths during winter.  Clean up any spills in your garage right away. This includes oil or grease spills. WHAT CAN I DO IN THE BATHROOM?   Use night lights.  Install grab bars by the toilet and in the tub and shower. Do not use towel bars as grab bars.  Use non-skid mats or decals in the tub or shower.  If you need to sit down in the shower, use a plastic, non-slip stool.  Keep the floor dry. Clean up any water that spills on the floor as soon as it happens.  Remove soap buildup in the tub or shower regularly.  Attach bath mats securely with double-sided non-slip rug tape.  Do not have throw rugs and other things on the floor that can make you trip. WHAT CAN I DO IN THE BEDROOM?  Use night lights.  Make sure that you have a light by your bed that is easy to  reach.  Do not use any sheets or blankets that are too big for your bed. They should not hang down onto the floor.  Have a firm chair that has side arms. You can use this for support while you get dressed.  Do not have throw rugs and other things on the floor that can make you trip. WHAT CAN I DO IN THE KITCHEN?  Clean up any spills right away.  Avoid walking on wet floors.  Keep items that you use a lot in easy-to-reach places.  If you need to reach something above you, use a strong step stool that has a grab bar.  Keep electrical cords out of the way.  Do not use floor polish or wax that makes floors slippery. If you must use wax, use non-skid floor wax.  Do not have throw rugs and other things on the floor that can make you trip. WHAT CAN I DO WITH MY STAIRS?  Do not leave any items on the stairs.  Make sure that there are handrails on both sides of the stairs and use them. Fix handrails that are broken or loose. Make sure that handrails are as long as the stairways.  Check any carpeting to make sure that it  is firmly attached to the stairs. Fix any carpet that is loose or worn.  Avoid having throw rugs at the top or bottom of the stairs. If you do have throw rugs, attach them to the floor with carpet tape.  Make sure that you have a light switch at the top of the stairs and the bottom of the stairs. If you do not have them, ask someone to add them for you. WHAT ELSE CAN I DO TO HELP PREVENT FALLS?  Wear shoes that:  Do not have high heels.  Have rubber bottoms.  Are comfortable and fit you well.  Are closed at the toe. Do not wear sandals.  If you use a stepladder:  Make sure that it is fully opened. Do not climb a closed stepladder.  Make sure that both sides of the stepladder are locked into place.  Ask someone to hold it for you, if possible.  Clearly mark and make sure that you can see:  Any grab bars or handrails.  First and last steps.  Where the  edge of each step is.  Use tools that help you move around (mobility aids) if they are needed. These include:  Canes.  Walkers.  Scooters.  Crutches.  Turn on the lights when you go into a dark area. Replace any light bulbs as soon as they burn out.  Set up your furniture so you have a clear path. Avoid moving your furniture around.  If any of your floors are uneven, fix them.  If there are any pets around you, be aware of where they are.  Review your medicines with your doctor. Some medicines can make you feel dizzy. This can increase your chance of falling. Ask your doctor what other things that you can do to help prevent falls.   This information is not intended to replace advice given to you by your health care provider. Make sure you discuss any questions you have with your health care provider.   Document Released: 02/06/2009 Document Revised: 08/27/2014 Document Reviewed: 05/17/2014 Elsevier Interactive Patient Education [employer].

## 2015-02-24 NOTE — Progress Notes (Signed)
Subjective:    Patient ID: Adrienne [name], female    DOB: 1946-03-13, 69 y.o.   MRN: [id number]  HPI 2 fall since Spring of this year , one was an accident , slipped  On maagazine fell  On left shoulder, twisted , since then pain intermittently  Walking on sidewalk at  [facility] on right side , since then right shoulder pain  Unable to  clap hands for a while also certain movement , inc driving are limited due to left hand pain Also  Feels  As though she is dragging her the foot at times , right Increased anxiety [place], also having sleep deprivation due to anxiety prior to long drives which she has no driver to share the drive with. Immunization need addressed at visit    Review of Systems See HPI Denies recent fever or chills. Denies sinus pressure, nasal congestion, ear pain or sore throat. Denies chest congestion, productive cough or wheezing. Denies chest pains, palpitations and leg swelling Denies abdominal pain, nausea, vomiting,diarrhea or constipation.   Denies dysuria, frequency, hesitancy or incontinence.  Denies headaches, seizures, numbness, or tingling.  Denies skin break down or rash.         Objective:   Physical Exam  BP 126/68 mmHg  Pulse 60  Resp 18  Wt 160 lb (72.576 kg)  SpO2 99% Patient alert and oriented and in no cardiopulmonary distress.  HEENT: No facial asymmetry, EOMI,   oropharynx pink and moist.  Neck decreased though adequate ROM with left trapezius spasm no JVD, no mass.  Chest: Clear to auscultation bilaterally.  CVS: S1, S2 no murmurs, no S3.Regular rate.  ABD: Soft non tender.   Ext: No edema  MS: Adequate ROM thoracic and  lumbar  spine, shoulders, hips and knees.  Skin: Intact, no ulcerations or rash noted.  Psych: Good eye contact, normal affect. Memory intact not anxious or depressed appearing.  CNS: CN 2-12 intact, power,  normal throughout.no focal deficits noted.         Assessment & Plan:  Essential hypertension Controlled, no change in medication DASH diet and commitment to daily physical activity for a minimum of 30 minutes discussed and encouraged, as a part of hypertension management. The importance of attaining a healthy weight is also discussed.  BP/Weight 02/24/2015 12/26/2014 07/03/2014 01/24/2014 07/10/2013 03/28/2013 05/39/7673  Systolic BP 419 379 024 097 353 299 242  Diastolic BP 68 74 64 80 80 84 80  Wt. (Lbs) 160 161 160.04 157.4 158.4 160 159  BMI 26.63 26.79 26.63 26.19 26.36 26.63 26.46        Bilateral shoulder pain Bilateral shoulder pain following recent trauma with direct b  Abnormality of gait Reports abnormal gait with unilateral weakness feels as though she is dragging one of her feet at times. concerned re recent fall history, 2 in past 6 months. Ortho to evaluate further Physical exam negative for loss of strength, sensation or abnormal tone in either of her lower extremities  Neck pain on left side Left neck pain with left upper extremity weakness, likely due to disc disease and arthritis in neck, x ray of c spine done on day of visit supports this , short anti inflammatory course prescribed and muscle relaxant  Hyperlipidemia LDL goal <100 Hyperlipidemia:Low fat diet discussed and encouraged.   Lipid Panel  Lab Results  Component Value Date   CHOL 252* 06/06/2014   HDL 108 06/06/2014   LDLCALC 130* 06/06/2014  TRIG 69 06/06/2014   CHOLHDL 2.3 06/06/2014   Updated lab needed at/ before next visit.     Overweight (BMI 25.0-29.9) Unchanged Patient re-educated about  the importance of commitment to a  minimum of 150 minutes of exercise per week.  The importance of healthy food choices with portion control discussed. Encouraged to start a food diary, count calories and to consider  joining a support group. Sample diet sheets offered. Goals set by the patient for the next several months.   Weight /BMI  02/24/2015 12/26/2014 07/03/2014  WEIGHT 160 lb 161 lb 160 lb 0.6 oz  HEIGHT - 5\' 5"  5\' 5"   BMI 26.63 kg/m2 26.79 kg/m2 26.63 kg/m2    Current exercise per week 60 minutes.   Need for prophylactic vaccination and inoculation against influenza After obtaining informed consent, the vaccine is  administered by LPN.

## 2015-02-25 ENCOUNTER — Ambulatory Visit (HOSPITAL_COMMUNITY)
Admission: RE | Admit: 2015-02-25 | Discharge: 2015-02-25 | Disposition: A | Discharge status: Home / Self Care | Payer: Medicare Other | Source: Ambulatory Visit | Attending: Family Medicine | Admitting: Family Medicine

## 2015-02-25 DIAGNOSIS — M542 Cervicalgia: Secondary | ICD-10-CM | POA: Diagnosis present

## 2015-02-25 DIAGNOSIS — M50322 Other cervical disc degeneration at C5-C6 level: Secondary | ICD-10-CM | POA: Insufficient documentation

## 2015-02-26 LAB — CBC
HCT: 36 % (ref 36.0–46.0)
Hemoglobin: 12.1 g/dL (ref 12.0–15.0)
MCH: 29.7 pg (ref 26.0–34.0)
MCHC: 33.6 g/dL (ref 30.0–36.0)
MCV: 88.5 fL (ref 78.0–100.0)
MPV: 11.7 fL (ref 8.6–12.4)
PLATELETS: 262 10*3/uL (ref 150–400)
RBC: 4.07 MIL/uL (ref 3.87–5.11)
RDW: 14.3 % (ref 11.5–15.5)
WBC: 6.5 10*3/uL (ref 4.0–10.5)

## 2015-02-27 ENCOUNTER — Other Ambulatory Visit: Payer: Self-pay | Admitting: Family Medicine

## 2015-02-27 ENCOUNTER — Other Ambulatory Visit: Payer: Self-pay

## 2015-02-27 DIAGNOSIS — M25511 Pain in right shoulder: Secondary | ICD-10-CM | POA: Insufficient documentation

## 2015-02-27 DIAGNOSIS — M25512 Pain in left shoulder: Secondary | ICD-10-CM

## 2015-02-27 DIAGNOSIS — R269 Unspecified abnormalities of gait and mobility: Secondary | ICD-10-CM | POA: Insufficient documentation

## 2015-02-27 LAB — COMPREHENSIVE METABOLIC PANEL
ALBUMIN: 3.6 g/dL (ref 3.6–5.1)
ALT: 15 U/L (ref 6–29)
AST: 17 U/L (ref 10–35)
Alkaline Phosphatase: 90 U/L (ref 33–130)
BILIRUBIN TOTAL: 0.3 mg/dL (ref 0.2–1.2)
BUN: 18 mg/dL (ref 7–25)
CALCIUM: 9.1 mg/dL (ref 8.6–10.4)
CHLORIDE: 103 mmol/L (ref 98–110)
CO2: 27 mmol/L (ref 20–31)
Creat: 0.81 mg/dL (ref 0.50–0.99)
Glucose, Bld: 103 mg/dL — ABNORMAL HIGH (ref 65–99)
Potassium: 3.4 mmol/L — ABNORMAL LOW (ref 3.5–5.3)
Sodium: 139 mmol/L (ref 135–146)
Total Protein: 6.1 g/dL (ref 6.1–8.1)

## 2015-02-27 LAB — HEPATITIS C ANTIBODY: HCV AB: NEGATIVE

## 2015-02-27 MED ORDER — PREDNISONE 5 MG (21) PO TBPK: 5.0000 mg | ORAL_TABLET | Freq: Two times a day (BID) | ORAL | Status: DC

## 2015-02-27 MED ORDER — METHOCARBAMOL 500 MG PO TABS: ORAL_TABLET | ORAL | Status: DC

## 2015-02-27 MED ORDER — IBUPROFEN 800 MG PO TABS: 800.0000 mg | ORAL_TABLET | Freq: Two times a day (BID) | ORAL | Status: DC | PRN

## 2015-02-27 MED ORDER — PREDNISONE 5 MG PO TABS: 5.0000 mg | ORAL_TABLET | Freq: Two times a day (BID) | ORAL | Status: DC

## 2015-03-06 NOTE — Assessment & Plan Note (Signed)
Reports abnormal gait with unilateral weakness feels as though she is dragging one of her feet at times. concerned re recent fall history, 2 in past 6 months. Ortho to evaluate further Physical exam negative for loss of strength, sensation or abnormal tone in either of her lower extremities

## 2015-03-06 NOTE — Assessment & Plan Note (Signed)
Controlled, no change in medication DASH diet and commitment to daily physical activity for a minimum of 30 minutes discussed and encouraged, as a part of hypertension management. The importance of attaining a healthy weight is also discussed.  BP/Weight 02/24/2015 12/26/2014 07/03/2014 01/24/2014 07/10/2013 03/28/2013 0000000  Systolic BP [id number] [id number] [id number]  Diastolic BP 68 74 64 80 80 84 80  Wt. (Lbs) 160 161 160.04 157.4 158.4 160 159  BMI 26.63 26.79 26.63 26.19 26.36 26.63 26.46

## 2015-03-06 NOTE — Assessment & Plan Note (Signed)
Hyperlipidemia:Low fat diet discussed and encouraged.   Lipid Panel  Lab Results  Component Value Date   CHOL 252* 06/06/2014   HDL 108 06/06/2014   LDLCALC 130* 06/06/2014   TRIG 69 06/06/2014   CHOLHDL 2.3 06/06/2014   Updated lab needed at/ before next visit.

## 2015-03-06 NOTE — Assessment & Plan Note (Signed)
Unchanged Patient re-educated about  the importance of commitment to a  minimum of 150 minutes of exercise per week.  The importance of healthy food choices with portion control discussed. Encouraged to start a food diary, count calories and to consider  joining a support group. Sample diet sheets offered. Goals set by the patient for the next several months.   Weight /BMI 02/24/2015 12/26/2014 07/03/2014  WEIGHT 160 lb 161 lb 160 lb 0.6 oz  HEIGHT - 5\' 5"  5\' 5"   BMI 26.63 kg/m2 26.79 kg/m2 26.63 kg/m2    Current exercise per week 60 minutes.

## 2015-03-06 NOTE — Assessment & Plan Note (Addendum)
Left neck pain with left upper extremity weakness, likely due to disc disease and arthritis in neck, x ray of c spine done on day of visit supports this , short anti inflammatory course prescribed and muscle relaxant

## 2015-03-06 NOTE — Assessment & Plan Note (Signed)
After obtaining informed consent, the vaccine is  administered by LPN.  

## 2015-03-06 NOTE — Assessment & Plan Note (Signed)
Bilateral shoulder pain following recent trauma with direct b

## 2015-03-10 DIAGNOSIS — M25512 Pain in left shoulder: Secondary | ICD-10-CM | POA: Diagnosis not present

## 2015-03-10 DIAGNOSIS — M25511 Pain in right shoulder: Secondary | ICD-10-CM | POA: Diagnosis not present

## 2015-04-04 ENCOUNTER — Encounter (HOSPITAL_COMMUNITY): Payer: Self-pay | Admitting: Occupational Therapy

## 2015-04-04 ENCOUNTER — Ambulatory Visit (HOSPITAL_COMMUNITY): Payer: Medicare Other | Attending: Orthopedic Surgery | Admitting: Occupational Therapy

## 2015-04-04 DIAGNOSIS — M7552 Bursitis of left shoulder: Secondary | ICD-10-CM | POA: Insufficient documentation

## 2015-04-04 DIAGNOSIS — M25512 Pain in left shoulder: Secondary | ICD-10-CM | POA: Diagnosis not present

## 2015-04-04 DIAGNOSIS — M7551 Bursitis of right shoulder: Secondary | ICD-10-CM | POA: Insufficient documentation

## 2015-04-04 DIAGNOSIS — M25511 Pain in right shoulder: Secondary | ICD-10-CM | POA: Diagnosis not present

## 2015-04-04 NOTE — Patient Instructions (Signed)
1) Shoulder Protraction    Begin with elbows by your side, slowly "punch" straight out in front of you keeping arms/elbows straight. Repeat 10-15___times, __2__set/day.     2) Shoulder Flexion  Supine:     Standing:         Begin with arms at your side with thumbs pointed up, slowly raise both arms up and forward towards overhead. Repeat_10-15__times, _2__set/day.               3) Horizontal abduction/adduction  Supine:   Standing:           Begin with arms straight out in front of you, bring out to the side in at "T" shape. Keep arms straight entire time. Repeat _10-15___times, _2___sets/day.                 4) Internal & External Rotation    *No band* -Stand with elbows at the side and elbows bent 90 degrees. Move your forearms away from your body, then bring back inward toward the body.  Repeat _10-15__times, _2__sets/day    5) Shoulder Abduction  Supine:     Standing:       Lying on your back begin with your arms flat on the table next to your side. Slowly move your arms out to the side so that they go overhead, in a jumping jack or snow angel movement. Repeat _10-15__times, _2__sets/day        1) Corner Stretch    Stand at a corner of a wall, place your arms on the walls with elbows bent. Lean into the corner until a stretch is felt along the front of your chest and/or shoulders. Hold for 5 seconds. Repeat 10X, 1-2 times/day.    2) Posterior Capsule Stretch    Bring the involved arm across chest. Grasp elbow and pull toward chest until you feel a stretch in the back of the upper arm and shoulder. Hold 5 seconds. Repeat 10X. Complete 1-2 times/day.    3) Scapular Retraction    Tuck chin back as you pinch shoulder blades together.  Hold 5 seconds. Repeat 10X. Complete 1-2 times/day.         (Home) Extension: Isometric / Bilateral Arm Retraction - Sitting   Facing anchor, hold hands and elbow at shoulder height,  with elbow bent.  Pull arms back to squeeze shoulder blades together. Repeat 10-15 times.  Copyright  VHI. All rights reserved.   (Home) Retraction: Row - Bilateral (Anchor)   Facing anchor, arms reaching forward, pull hands toward stomach, keeping elbows bent and at your sides and pinching shoulder blades together. Repeat 10-15 times.  Copyright  VHI. All rights reserved.   (Clinic) Extension / Flexion (Assist)   Face anchor, pull arms back, keeping elbow straight, and squeze shoulder blades together. Repeat 10-15 times.   Copyright  VHI. All rights reserved.

## 2015-04-04 NOTE — Therapy (Signed)
[place], [place], [address] Phone: [phone]   Fax:  [phone]  Occupational Therapy Evaluation  [name] MRN: [id number] Date of Birth: 1946-03-27 Referring Provider: Dr. Earlie Server  Encounter Date: 04/04/2015      OT End of Session - 04/04/15 1156    Visit Number 1   Number of Visits 1   Date for OT Re-Evaluation 06/03/15   Authorization Type Medicare   Authorization - Visit Number 1   Authorization - Number of Visits 10   OT Start Time [id number]   OT Stop Time 1144   OT Time Calculation (min) 39 min   Activity Tolerance Patient tolerated treatment well   Behavior During Therapy [facility] for tasks assessed/performed      Past Medical History  Diagnosis Date  . Hypertension   . Hyperlipidemia   . Diverticulosis 05/2009    Past Surgical History  Procedure Laterality Date  . Cholecystectomy  1980  . Abdominal hysterectomy  1990    for fibroids ovaries remain   . Left nipple inversion bx benign  2000  . Colonoscopy  05/2009    rare diverticula, small internal hemorrhoids. Next colonoscopy in 5-10 yrs.    There were no vitals filed for this visit.  Visit Diagnosis:  Bilateral shoulder bursitis  Bilateral shoulder pain      Subjective Assessment - 04/04/15 1154    Subjective  S: I've been falling some and it has caused this shoulder pain.    Pertinent History Pt is a 69 y/o female with bilateral shoulder bursitis after 2 falls several months ago. Pt reports she does not experience pain all the time but when she does it is around a 5/10. Dr. French Ana referred pt to occupational therapy for evaluation and treatment.    Special Tests FOTO Score: 52/100 (48% impairment)   Patient Stated Goals to decrease the pain in my shoulders   Currently in Pain? No/denies           [facility] OT Assessment - 04/04/15 1101    Assessment   Diagnosis Bilateral Shoulder Bursitis    Referring Provider Dr. Earlie Server   Onset Date --  Right shoulder: Oct 2016; Left shoulder: Aug 2016.   Prior Therapy None for shoulders   Precautions   Precautions None   Balance Screen   Has the patient fallen in the past 6 months Yes   How many times? 2   Has the patient had a decrease in activity level because of a fear of falling?  No   Is the patient reluctant to leave their home because of a fear of falling?  No   Home  Environment   Family/patient expects to be discharged to: Private residence   Prior Function   Level of Independence Independent with basic ADLs   Vocation Retired   Leisure Personal assistant, Armed forces operational officer dancing, exercising-walking   ADL   ADL comments Pt is having difficulty with driving, is able to complete ADL tasks however experiences pain during many tasks.    Written Expression   Dominant Hand Right   ROM / Strength   AROM / PROM / Strength AROM;PROM;Strength   Palpation   Palpation comment Max fascial restrictions in bilateral trapezius regions   AROM   Overall AROM  Within functional limits for tasks performed   Overall AROM Comments Assessed in standing, ER/IR adducted   AROM Assessment Site Shoulder   Right/Left Shoulder Right;Left  Right Shoulder Flexion 180 Degrees   Right Shoulder ABduction 180 Degrees   Right Shoulder Internal Rotation 90 Degrees   Right Shoulder External Rotation 90 Degrees   Left Shoulder Flexion 180 Degrees   Left Shoulder ABduction 180 Degrees   Left Shoulder Internal Rotation 90 Degrees   Left Shoulder External Rotation 90 Degrees   PROM   PROM Assessment Site --   Right/Left Shoulder --   Strength   Overall Strength Comments Assessed seated, ER/IR adducted   Strength Assessment Site Shoulder   Right/Left Shoulder Right;Left   Right Shoulder Flexion 5/5   Right Shoulder ABduction 5/5   Right Shoulder Internal Rotation 5/5   Right Shoulder External Rotation 5/5   Left Shoulder Flexion 4+/5   Left Shoulder ABduction  5/5   Left Shoulder Internal Rotation 5/5   Left Shoulder External Rotation 5/5                         OT Education - 2015/04/06 1156    Education provided Yes   Education Details A/ROM exercises, shoulder stretches, red scapular theraband exercises; educated on self-myofascial release technique with tennis ball   Person(s) Educated Patient   Methods Explanation;Demonstration;Handout   Comprehension Verbalized understanding;Returned demonstration          OT Short Term Goals - Apr 06, 2015 1203    OT SHORT TERM GOAL #1   Title Pt will be educated and independent in HEP.    Time 1   Period Days   Status Achieved                  Plan - 06-Apr-2015 1159    Clinical Impression Statement A: Pt is a 69 y/o female presenting with bilateral shoulder bursitis causing pain during daily and leisure tasks. Pt reports she is able to complete all ADL tasks at baseline level of function, however experiences pain intermittantly. Pt demonstrates BUE A/ROM and strength WNL, educated pt on A/ROM exercises, shoulder stretches, and red scapular theraband exercises for HEP. Also educated pt on use of ice and heat for pain relief.    Pt will benefit from skilled therapeutic intervention in order to improve on the following deficits (Retired) Pain   Rehab Potential Good   OT Frequency 1x / week   OT Duration --  1 visit   OT Treatment/Interventions Patient/family education   Plan P: Educated pt on HEP complete with goal of decreasing pain during daily tasks.    OT Home Exercise Plan A/ROM, shoulder stretches, scapular theraband   Consulted and Agree with Plan of Care Patient          G-Codes - 04/06/2015 1203    Functional Assessment Tool Used FOTO Score: 52/100 (48% impairment)   Functional Limitation Carrying, moving and handling objects   Carrying, Moving and Handling Objects Current Status [id number]) At least 40 percent but less than 60 percent impaired, limited or restricted    Carrying, Moving and Handling Objects Goal Status [id number]) At least 40 percent but less than 60 percent impaired, limited or restricted   Carrying, Moving and Handling Objects Discharge Status [phone]) At least 40 percent but less than 60 percent impaired, limited or restricted      Problem List Patient Active Problem List   Diagnosis Date Noted  . Abnormality of gait 02/27/2015  . Bilateral shoulder pain 02/27/2015  . Neck pain on left side 02/24/2015  . Other chest pain 01/19/2015  . Allergic sinusitis 12/26/2014  .  Cough 12/26/2014  . GERD (gastroesophageal reflux disease) 12/26/2014  . Chronic pain of right thumb 07/21/2014  . Alopecia 07/21/2014  . Diverticulosis of colon without hemorrhage 07/05/2014  . FH: pancreatic cancer 01/26/2014  . FH: glaucoma 03/24/2013  . Chronic cough 03/20/2013  . Overweight (BMI 25.0-29.9) 03/13/2012  . KNEE PAIN, LEFT, CHRONIC 05/06/2010  . Hyperlipidemia LDL goal <100 04/03/2009  . Essential hypertension 04/03/2009    Guadelupe Sabin, OTR/L  [phone]  04/04/2015, 12:05 PM  [place] [address] [place], [place], [address] Phone: [phone]   Fax:  [phone]  Name: Adrienne [name] MRN: [id number] Date of Birth: 1946/02/25

## 2015-04-25 ENCOUNTER — Other Ambulatory Visit: Payer: Self-pay | Admitting: Family Medicine

## 2015-06-04 ENCOUNTER — Other Ambulatory Visit: Payer: Self-pay | Admitting: Family Medicine

## 2015-07-23 DIAGNOSIS — E785 Hyperlipidemia, unspecified: Secondary | ICD-10-CM | POA: Diagnosis not present

## 2015-07-23 DIAGNOSIS — I10 Essential (primary) hypertension: Secondary | ICD-10-CM | POA: Diagnosis not present

## 2015-07-24 ENCOUNTER — Encounter: Payer: Self-pay | Admitting: Family Medicine

## 2015-07-24 ENCOUNTER — Ambulatory Visit (INDEPENDENT_AMBULATORY_CARE_PROVIDER_SITE_OTHER): Payer: Medicare Other | Admitting: Family Medicine

## 2015-07-24 VITALS — BP 120/78 | HR 77 | Resp 16 | Ht 65.0 in | Wt 159.0 lb

## 2015-07-24 DIAGNOSIS — Z1211 Encounter for screening for malignant neoplasm of colon: Secondary | ICD-10-CM | POA: Diagnosis not present

## 2015-07-24 DIAGNOSIS — E785 Hyperlipidemia, unspecified: Secondary | ICD-10-CM | POA: Diagnosis not present

## 2015-07-24 DIAGNOSIS — I10 Essential (primary) hypertension: Secondary | ICD-10-CM | POA: Diagnosis not present

## 2015-07-24 DIAGNOSIS — Z0000 Encounter for general adult medical examination without abnormal findings: Secondary | ICD-10-CM

## 2015-07-24 DIAGNOSIS — Z1231 Encounter for screening mammogram for malignant neoplasm of breast: Secondary | ICD-10-CM

## 2015-07-24 LAB — BASIC METABOLIC PANEL
BUN: 11 mg/dL (ref 7–25)
CO2: 29 mmol/L (ref 20–31)
Calcium: 9.7 mg/dL (ref 8.6–10.4)
Chloride: 102 mmol/L (ref 98–110)
Creat: 0.75 mg/dL (ref 0.50–0.99)
Glucose, Bld: 82 mg/dL (ref 65–99)
POTASSIUM: 4.2 mmol/L (ref 3.5–5.3)
SODIUM: 139 mmol/L (ref 135–146)

## 2015-07-24 LAB — LIPID PANEL
CHOL/HDL RATIO: 2.2 ratio (ref <=5.0)
CHOLESTEROL: 252 mg/dL — AB (ref 125–200)
HDL: 114 mg/dL (ref >=46)
LDL Cholesterol: 121 mg/dL (ref <130)
TRIGLYCERIDES: 86 mg/dL (ref <150)
VLDL: 17 mg/dL (ref <30)

## 2015-07-24 LAB — TSH: TSH: 0.88 m[IU]/L

## 2015-07-24 NOTE — Progress Notes (Signed)
Subjective:    Patient ID: Adrienne [name], female    DOB: [birth year], 70 y.o.   MRN: [id number]  HPI Preventive Screening-Counseling & Management   Patient present here today for a Medicare annual wellness visit.   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 Never smoker    Risk Factors  Current exercise habits:  Walks several days a week and uses simply fit board   Dietary issues discussed: heart healthy- low carb and low fat encouraged    Cardiac risk factors: none significant Depression Screen  (Note: if answer to either of the following is "Yes", a more complete depression screening is indicated)   Over the past two weeks, have you felt down, depressed or hopeless? No  Over the past two weeks, have you felt little interest or pleasure in doing things? No  Have you lost interest or pleasure in daily life? No  Do you often feel hopeless? No  Do you cry easily over simple problems? No   Activities of Daily Living  In your present state of health, do you have any difficulty performing the following activities?  Driving?: No Managing money?: No Feeding yourself?:No Getting from bed to chair?:No Climbing a flight of stairs?:No Preparing food and eating?:No Bathing or showering?:No Getting dressed?:No Getting to the toilet?:No Using the toilet?:No Moving around from place to place?: No  Fall Risk Assessment In the past year have you fallen or had a near fall?:No Are you currently taking any medications that make you dizzy?:No   Hearing Difficulties: No Do you often ask people to speak up or repeat themselves?:No Do you experience ringing or noises in your ears?:No Do you have difficulty understanding soft or whispered voices?:No  Cognitive Testing  Alert? Yes Normal Appearance?Yes  Oriented to  person? Yes Place? Yes  Time? Yes  Displays appropriate judgment?Yes  Can read the correct time from a watch face? yes Are you having problems remembering things?No  Advanced Directives have been discussed with the patient?Yes, already has brochure given at previous visit  , full code, needs to work on living will   List the Names of Other Physician/Practitioners you currently use:  Dr Harrington Challenger (dentist) Dr Iona Hansen (opth)   Indicate any recent Medical Services you may have received from other than [facility] providers in the past year (date may be approximate).   Assessment:    Annual Wellness Exam   Plan:   Patient Instructions (the written plan) was given to the patient.  Medicare Attestation  I have personally reviewed:  The patient's medical and social history  Their use of alcohol, tobacco or illicit drugs  Their current medications and supplements  The patient's functional ability including ADLs,fall risks, home safety risks, cognitive, and hearing and visual impairment  Diet and physical activities  Evidence for depression or mood disorders  The patient's weight, height, BMI, and visual acuity have been recorded in the chart. I have made referrals, counseling, and provided education to the patient based on review of the above and I have provided the patient with a written personalized care plan for preventive services.      Review of Systems     Objective:   Physical Exam  BP 120/78 mmHg  Pulse 77  Resp 16  Ht 5\' 5"  (1.651 m)  Wt 159 lb (72.122 kg)  BMI  26.46 kg/m2  SpO2 98%      Assessment & Plan:   Medicare annual wellness visit, subsequent Annual exam as documented. Counseling done  re healthy lifestyle involving commitment to 150 minutes exercise per week, heart healthy diet, and attaining healthy weight.The importance of adequate sleep also discussed. Regular seat belt use and home safety, is also discussed. Changes in health habits are decided on by the  patient with goals and time frames  set for achieving them. Immunization and cancer screening needs are specifically addressed at this visit.

## 2015-07-24 NOTE — Patient Instructions (Addendum)
F/u in 6 months, call if you need me before  Fasting lipid, chem 7 in 5. 5  Month  You are referred for mammogram, colonoscopy and please call with your gyne contact for annual exam   No med changes  Thanks for choosing [facility], we consider it a privelige to serve you.   Excellent BP and continue to improve diet so that cholesterol normalizes

## 2015-07-25 ENCOUNTER — Other Ambulatory Visit: Payer: Self-pay | Admitting: Family Medicine

## 2015-07-27 ENCOUNTER — Encounter: Payer: Self-pay | Admitting: Family Medicine

## 2015-07-27 NOTE — Assessment & Plan Note (Signed)

## 2015-09-05 ENCOUNTER — Other Ambulatory Visit: Payer: Self-pay | Admitting: Family Medicine

## 2015-10-28 ENCOUNTER — Other Ambulatory Visit: Payer: Self-pay | Admitting: Family Medicine

## 2015-12-02 ENCOUNTER — Other Ambulatory Visit: Payer: Self-pay | Admitting: Family Medicine

## 2015-12-02 DIAGNOSIS — Z1231 Encounter for screening mammogram for malignant neoplasm of breast: Secondary | ICD-10-CM

## 2015-12-10 ENCOUNTER — Ambulatory Visit (HOSPITAL_COMMUNITY)
Admission: RE | Admit: 2015-12-10 | Discharge: 2015-12-10 | Disposition: A | Discharge status: Home / Self Care | Payer: Medicare Other | Source: Ambulatory Visit | Attending: Family Medicine | Admitting: Family Medicine

## 2015-12-10 DIAGNOSIS — Z1231 Encounter for screening mammogram for malignant neoplasm of breast: Secondary | ICD-10-CM | POA: Insufficient documentation

## 2016-01-13 ENCOUNTER — Encounter: Payer: Self-pay | Admitting: Family Medicine

## 2016-01-13 DIAGNOSIS — H2513 Age-related nuclear cataract, bilateral: Secondary | ICD-10-CM | POA: Diagnosis not present

## 2016-01-13 DIAGNOSIS — H538 Other visual disturbances: Secondary | ICD-10-CM | POA: Diagnosis not present

## 2016-01-20 ENCOUNTER — Ambulatory Visit: Payer: Medicare Other | Admitting: Family Medicine

## 2016-01-25 ENCOUNTER — Other Ambulatory Visit: Payer: Self-pay | Admitting: Family Medicine

## 2016-02-03 ENCOUNTER — Telehealth: Payer: Self-pay

## 2016-02-03 ENCOUNTER — Ambulatory Visit (INDEPENDENT_AMBULATORY_CARE_PROVIDER_SITE_OTHER): Payer: Medicare Other | Admitting: Family Medicine

## 2016-02-03 ENCOUNTER — Encounter: Payer: Self-pay | Admitting: Family Medicine

## 2016-02-03 ENCOUNTER — Ambulatory Visit (HOSPITAL_COMMUNITY)
Admission: RE | Admit: 2016-02-03 | Discharge: 2016-02-03 | Disposition: A | Discharge status: Home / Self Care | Payer: Medicare Other | Source: Ambulatory Visit | Attending: Family Medicine | Admitting: Family Medicine

## 2016-02-03 VITALS — BP 122/80 | HR 61 | Ht 65.0 in | Wt 154.0 lb

## 2016-02-03 DIAGNOSIS — M5410 Radiculopathy, site unspecified: Secondary | ICD-10-CM

## 2016-02-03 DIAGNOSIS — M5136 Other intervertebral disc degeneration, lumbar region: Secondary | ICD-10-CM | POA: Diagnosis not present

## 2016-02-03 DIAGNOSIS — G8929 Other chronic pain: Secondary | ICD-10-CM | POA: Diagnosis not present

## 2016-02-03 DIAGNOSIS — M25551 Pain in right hip: Secondary | ICD-10-CM

## 2016-02-03 DIAGNOSIS — E663 Overweight: Secondary | ICD-10-CM

## 2016-02-03 DIAGNOSIS — E785 Hyperlipidemia, unspecified: Secondary | ICD-10-CM | POA: Diagnosis not present

## 2016-02-03 DIAGNOSIS — I10 Essential (primary) hypertension: Secondary | ICD-10-CM

## 2016-02-03 DIAGNOSIS — M545 Low back pain: Secondary | ICD-10-CM | POA: Diagnosis not present

## 2016-02-03 DIAGNOSIS — Z23 Encounter for immunization: Secondary | ICD-10-CM

## 2016-02-03 LAB — BASIC METABOLIC PANEL
BUN: 18 mg/dL (ref 7–25)
CALCIUM: 9.9 mg/dL (ref 8.6–10.4)
CHLORIDE: 104 mmol/L (ref 98–110)
CO2: 29 mmol/L (ref 20–31)
CREATININE: 0.82 mg/dL (ref 0.50–0.99)
GLUCOSE: 83 mg/dL (ref 65–99)
Potassium: 3.9 mmol/L (ref 3.5–5.3)
SODIUM: 141 mmol/L (ref 135–146)

## 2016-02-03 LAB — LIPID PANEL
Cholesterol: 242 mg/dL — ABNORMAL HIGH (ref 125–200)
HDL: 109 mg/dL (ref >=46)
LDL Cholesterol: 119 mg/dL (ref <130)
Total CHOL/HDL Ratio: 2.2 Ratio (ref <=5.0)
Triglycerides: 69 mg/dL (ref <150)
VLDL: 14 mg/dL (ref <30)

## 2016-02-03 NOTE — Patient Instructions (Addendum)
Annual wellness March 31 or after, call if you need me before  Please schedule colonoscopy , this  Is past due   You will be referred  To Dr Percell Miller, for right hip  Pain and please get X ray of low back today  Check my chart for results and respond  Thank you  for choosing [facility]. We consider it a privelige to serve you.  Delivering excellent health care in a caring and  compassionate way is our goal.  Partnering with you,  so that together we can achieve this goal is our strategy.   COLONOSCOPY before year end, but schedule  Thank you  for choosing [facility]. We consider it a privelige to serve you.  Delivering excellent health care in a caring and  compassionate way is our goal.  Partnering with you,  so that together we can achieve this goal is our strategy.

## 2016-02-03 NOTE — Telephone Encounter (Signed)
Expired labs reordered  

## 2016-02-03 NOTE — Assessment & Plan Note (Addendum)
4 month h/o worsening pain  With intermittent instability,and click, refer to ortho for E/M Needs x ray of lumbar spine as likely has DJD spine contributing

## 2016-02-03 NOTE — Progress Notes (Signed)
   Adrienne [name]     MRN: [id number]      DOB: [birth year]   HPI Adrienne [name] is here for follow up and re-evaluation of chronic medical conditions, medication management and review of any available recent lab and radiology data.  Preventive health is updated, specifically  Cancer screening and Immunization.  Still needs colonoscopy . The PT denies any adverse reactions to current medications since the last visit.  4 month h/o daily right hip pain, sharp to a 10, using  NSAID 1 to 2 times daily fr relief. Pop in hip at times and some instability  Intermittent left toe numbness x 1 year, worse in past 4 month, no incontinence , no weakness or lower extremity sensory loss   ROS Denies recent fever or chills. Denies sinus pressure, nasal congestion, ear pain or sore throat. Denies chest congestion, productive cough or wheezing. Denies chest pains, palpitations and leg swelling Denies abdominal pain, nausea, vomiting,diarrhea or constipation.   Denies dysuria, frequency, hesitancy or incontinence.  Denies headaches, seizures, numbness, or tingling. Denies depression, anxiety or insomnia. Denies skin break down or rash.   PE  BP 122/80   Pulse 61   Ht 5\' 5"  (1.651 m)   Wt 154 lb (69.9 kg)   SpO2 98%   BMI 25.63 kg/m   Patient alert and oriented and in no cardiopulmonary distress.  HEENT: No facial asymmetry, EOMI,   oropharynx pink and moist.  Neck supple no JVD, no mass.  Chest: Clear to auscultation bilaterally.  CVS: S1, S2 no murmurs, no S3.Regular rate.  ABD: Soft non tender.   Ext: No edema  MS: Adequate though reduce ROM lumbar  Spine,normal in shoulders,  and knees.Decreased in right hip Skin: Intact, no ulcerations or rash noted.  Psych: Good eye contact, normal affect. Memory intact not anxious or depressed appearing.  CNS: CN 2-12 intact, power,  normal throughout.no focal deficits noted.   Assessment & Plan Hip pain, chronic, right 4 month h/o worsening  pain  With intermittent instability,and click, refer to ortho for E/M Needs x ray of lumbar spine as likely has DJD spine contributing   Essential hypertension Controlled, no change in medication DASH diet and commitment to daily physical activity for a minimum of 30 minutes discussed and encouraged, as a part of hypertension management. The importance of attaining a healthy weight is also discussed.  BP/Weight 02/03/2016 07/24/2015 02/24/2015 12/26/2014 07/03/2014 01/24/2014 123456  Systolic BP 123XX123 123456 123XX123 [id number] 123456 [id number]  Diastolic BP 80 78 68 74 64 80 80  Wt. (Lbs) 154 159 160 161 160.04 157.4 158.4  BMI 25.63 26.46 26.63 26.79 26.63 26.19 26.36       Hyperlipidemia LDL goal <100 Hyperlipidemia:Low fat diet discussed and encouraged.   Lipid Panel  Lab Results  Component Value Date   CHOL 242 (H) 02/03/2016   HDL 109 02/03/2016   LDLCALC 119 02/03/2016   TRIG 69 02/03/2016   CHOLHDL 2.2 02/03/2016   Not at goal, needs to change to plant based eating   Overweight (BMI 25.0-29.9) Improved. Pt applauded on succesful weight loss through lifestyle change, and encouraged to continue same. Weight loss goal set for the next several months.   Back pain with right-sided radiculopathy DG lumbar to further eval

## 2016-02-04 ENCOUNTER — Encounter: Payer: Self-pay | Admitting: Family Medicine

## 2016-02-08 ENCOUNTER — Encounter: Payer: Self-pay | Admitting: Family Medicine

## 2016-02-08 ENCOUNTER — Other Ambulatory Visit: Payer: Self-pay | Admitting: Family Medicine

## 2016-02-08 NOTE — Assessment & Plan Note (Signed)
Improved. Pt applauded on succesful weight loss through lifestyle change, and encouraged to continue same. Weight loss goal set for the next several months.  

## 2016-02-08 NOTE — Assessment & Plan Note (Signed)
Controlled, no change in medication DASH diet and commitment to daily physical activity for a minimum of 30 minutes discussed and encouraged, as a part of hypertension management. The importance of attaining a healthy weight is also discussed.  BP/Weight 02/03/2016 07/24/2015 02/24/2015 12/26/2014 07/03/2014 01/24/2014 123456  Systolic BP 123XX123 123456 123XX123 [id number] [id number] 123456 [id number]  Diastolic BP 80 78 68 74 64 80 80  Wt. (Lbs) 154 159 160 161 160.04 157.4 158.4  BMI 25.63 26.46 26.63 26.79 26.63 26.19 26.36

## 2016-02-08 NOTE — Assessment & Plan Note (Signed)
Hyperlipidemia:Low fat diet discussed and encouraged.   Lipid Panel  Lab Results  Component Value Date   CHOL 242 (H) 02/03/2016   HDL 109 02/03/2016   LDLCALC 119 02/03/2016   TRIG 69 02/03/2016   CHOLHDL 2.2 02/03/2016   Not at goal, needs to change to plant based eating

## 2016-02-08 NOTE — Assessment & Plan Note (Signed)
DG lumbar to further eval

## 2016-02-09 DIAGNOSIS — M1611 Unilateral primary osteoarthritis, right hip: Secondary | ICD-10-CM | POA: Diagnosis not present

## 2016-02-24 ENCOUNTER — Ambulatory Visit: Payer: Medicare Other | Admitting: Family Medicine

## 2016-04-07 IMAGING — MG MM DIGITAL SCREENING
5 series · 5 of 5 positions shown · non-contrast
Comparison: Previous exam(s).

ACR Breast Density Category a: The breast tissue is almost entirely
fatty.

CLINICAL DATA: Screening.

EXAM:
DIGITAL SCREENING BILATERAL MAMMOGRAM WITH CAD

[L CC]
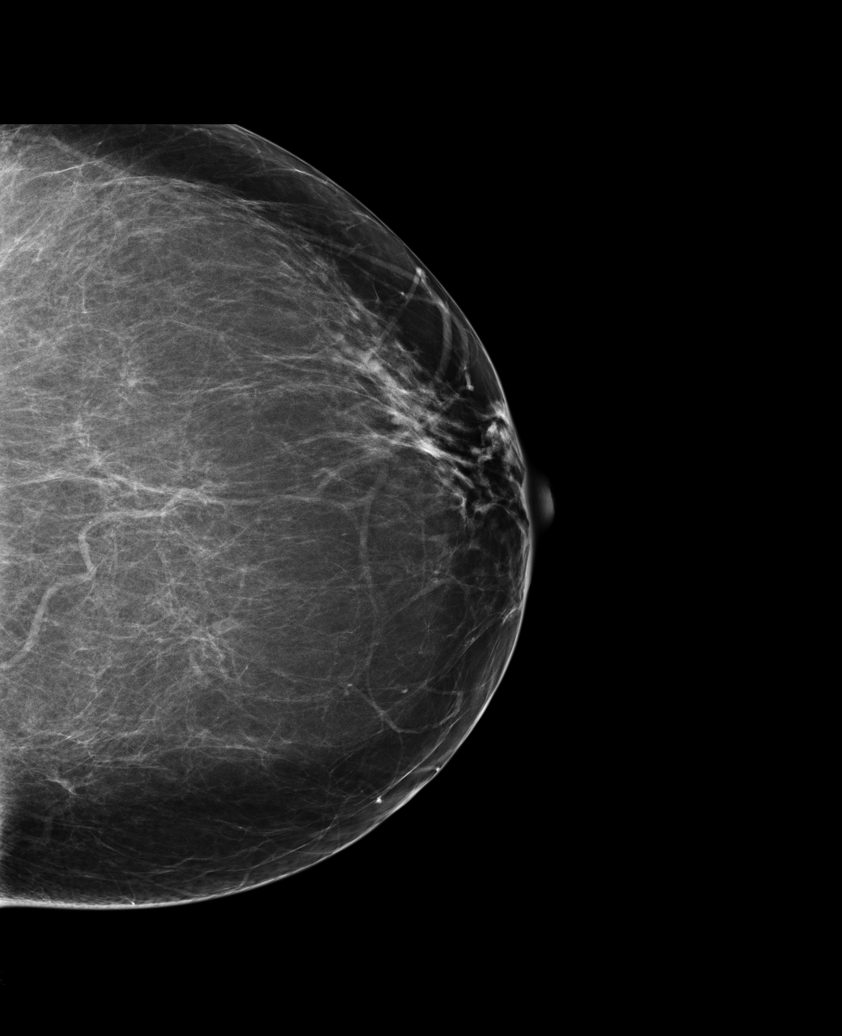

[L MLO]
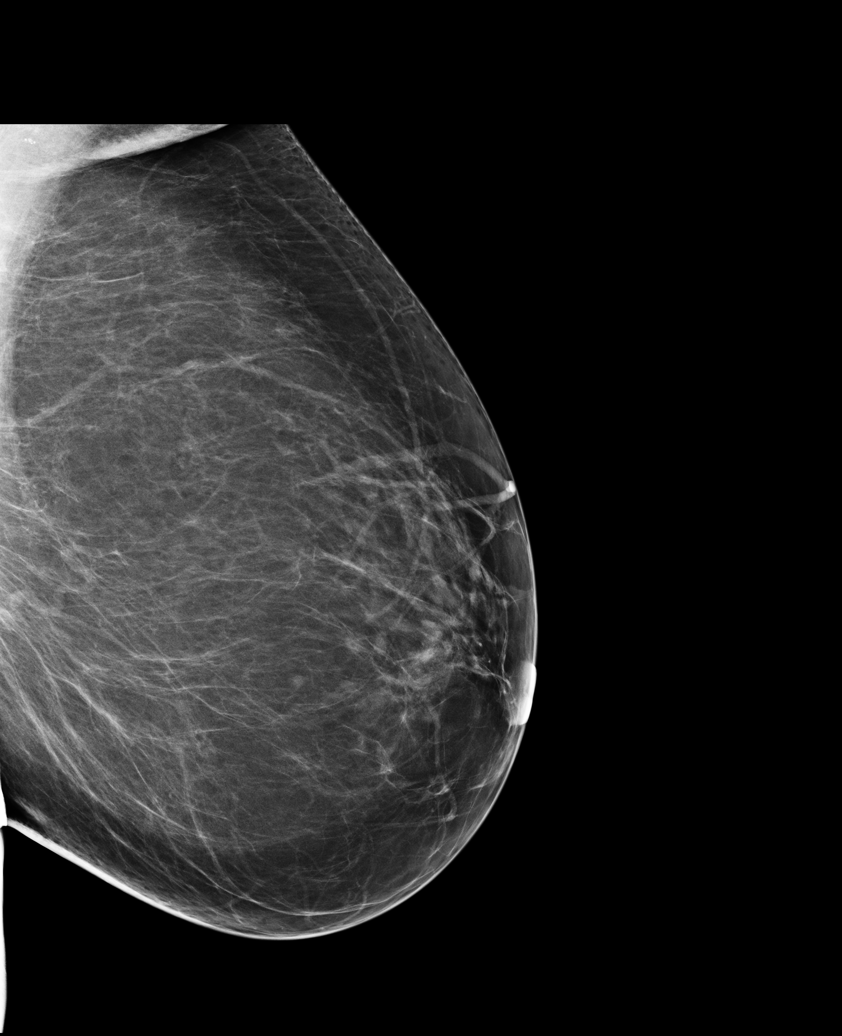

[R CC]
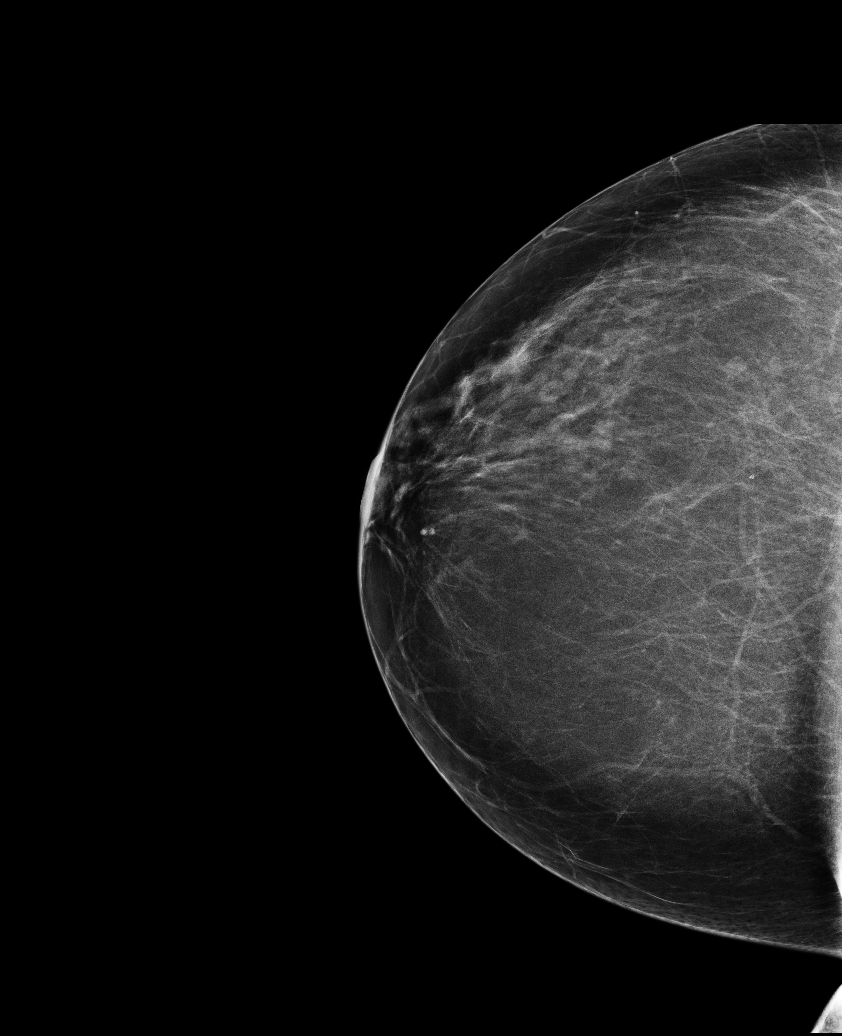

[R MLO (1 of 2)]
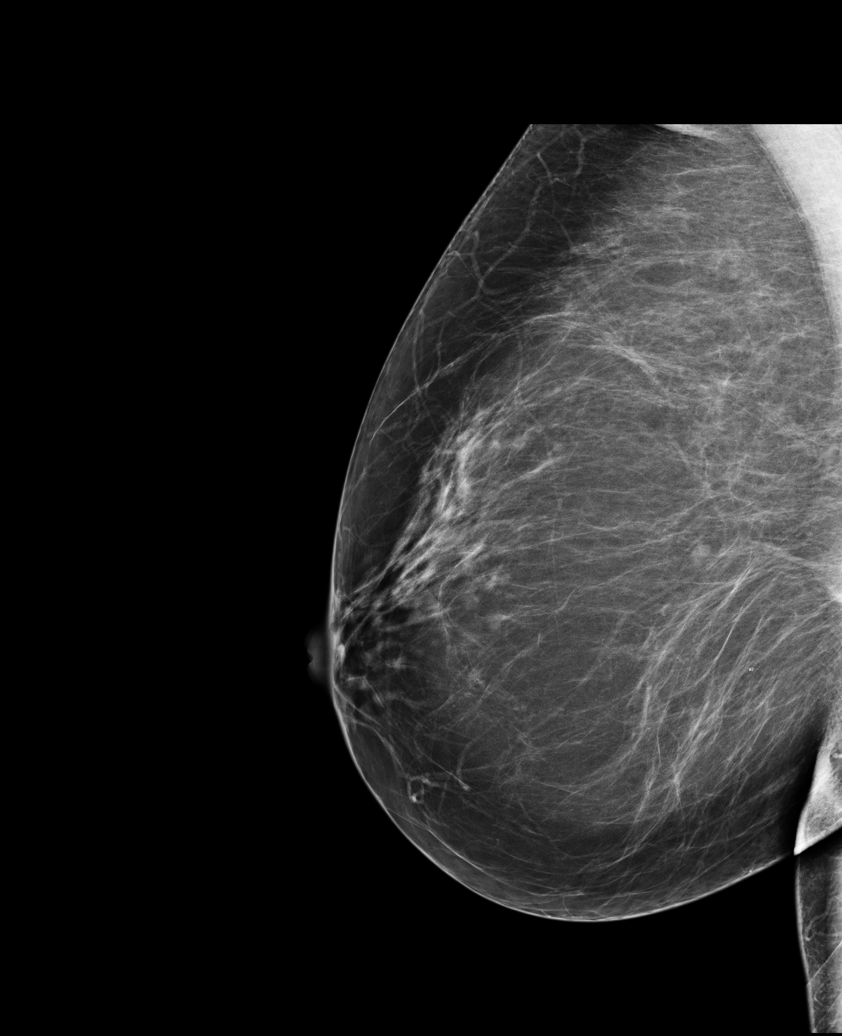

[R MLO (2 of 2)]
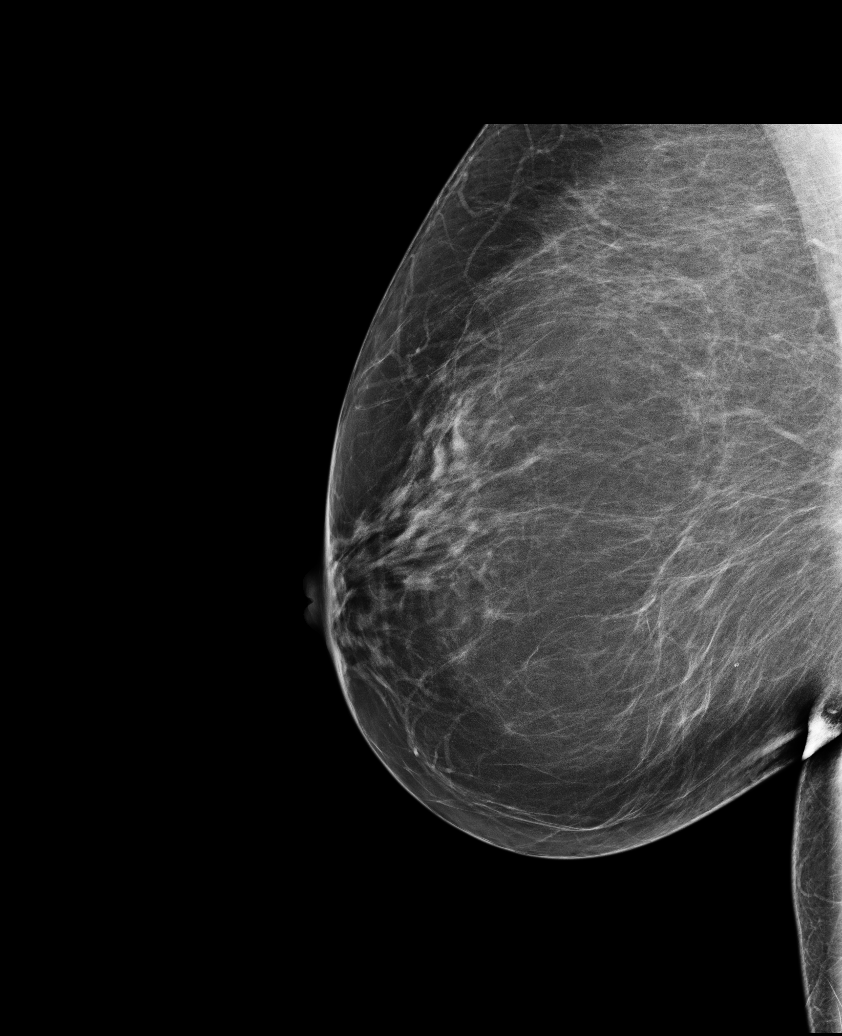

[5 of 5 positions shown; findings below may reference images not displayed]

FINDINGS: There are no findings suspicious for malignancy. Images were
processed with CAD.
IMPRESSION: No mammographic evidence of malignancy. A result letter of this
screening mammogram will be mailed directly to the patient.

RECOMMENDATION:
Screening mammogram in one year. (Code:[id number])

BI-RADS CATEGORY  1: Negative.

## 2016-04-26 ENCOUNTER — Other Ambulatory Visit: Payer: Self-pay | Admitting: Family Medicine

## 2016-05-06 ENCOUNTER — Telehealth: Payer: Self-pay

## 2016-05-06 ENCOUNTER — Other Ambulatory Visit: Payer: Self-pay

## 2016-05-06 MED ORDER — MECLIZINE HCL 12.5 MG PO TABS: 12.5000 mg | ORAL_TABLET | Freq: Three times a day (TID) | ORAL | 0 refills | Status: DC | PRN

## 2016-05-06 NOTE — Telephone Encounter (Signed)
Standing order for Meclizine 12.5mg  1 tablet TID PRN #15 initiated.  Med sent to pharmacy requested.    Patient states that symptoms started 1 day ago with spinning of the room.

## 2016-05-11 ENCOUNTER — Telehealth: Payer: Self-pay | Admitting: Family Medicine

## 2016-05-11 NOTE — Telephone Encounter (Signed)
Left message on home voicemail that AWV appt need to be rescheduled with Albina Billet.  Time scheduled currently is unavailable on [facility] schedule.

## 2016-07-14 ENCOUNTER — Other Ambulatory Visit: Payer: Self-pay | Admitting: Family Medicine

## 2016-07-28 ENCOUNTER — Ambulatory Visit (INDEPENDENT_AMBULATORY_CARE_PROVIDER_SITE_OTHER): Payer: Medicare Other

## 2016-07-28 VITALS — BP 128/80 | HR 62 | Temp 98.2°F | Ht 65.0 in | Wt 160.0 lb

## 2016-07-28 DIAGNOSIS — Z0000 Encounter for general adult medical examination without abnormal findings: Secondary | ICD-10-CM

## 2016-07-28 NOTE — Progress Notes (Signed)
Subjective:   Adrienne [name] is a 71 y.o. female who presents for Medicare Annual (Subsequent) preventive examination.  Review of Systems:  Cardiac Risk Factors include: advanced age (>93men, >48 women);dyslipidemia;hypertension     Objective:     Vitals: BP 128/80   Pulse 62   Temp 98.2 F (36.8 C) (Oral)   Ht 5\' 5"  (1.651 m)   Wt 160 lb 0.6 oz (72.6 kg)   SpO2 98%   BMI 26.63 kg/m   Body mass index is 26.63 kg/m.   Tobacco History  Smoking Status  . Never Smoker  Smokeless Tobacco  . Never Used     Counseling given: Not Answered   Past Medical History:  Diagnosis Date  . Allergy   . Diverticulosis 05/2009  . Hyperlipidemia   . Hypertension    Past Surgical History:  Procedure Laterality Date  . ABDOMINAL HYSTERECTOMY  1990   for fibroids ovaries remain   . CHOLECYSTECTOMY  1980  . COLONOSCOPY  05/2009   rare diverticula, small internal hemorrhoids. Next colonoscopy in 5-10 yrs.  . left nipple inversion bx benign  2000   Family History  Problem Relation Age of Onset  . Kidney failure Father   . Arthritis Father   . Heart disease Sister   . Hypertension Sister   . Arthritis Sister   . Breast cancer Sister   . Diabetes Sister   . Arthritis Sister   . Lung cancer Brother     was a smoker  . Arthritis Brother   . Arthritis Mother   . Prostate cancer Brother    History  Sexual Activity  . Sexual activity: Not Currently  . Birth control/ protection: Surgical    Comment: not asked    Outpatient Encounter Prescriptions as of 07/28/2016  Medication Sig  . amLODipine (NORVASC) 2.5 MG tablet TAKE ONE TABLET BY MOUTH ONCE DAILY  . aspirin EC 81 MG tablet Take 1 tablet (81 mg total) by mouth daily.  Marland Kitchen azelastine (ASTELIN) 0.1 % nasal spray Place 2 sprays into both nostrils 2 (two) times daily. Use in each nostril as directed (Patient taking differently: Place 2 sprays into both nostrils 2 (two) times daily as needed. Use in each nostril as directed)  .  BIOTIN 5000 PO Take 2 capsules by mouth daily.   Marland Kitchen CALCIUM-MAGNESUIUM-ZINC 333-133-8.3 MG TABS Take 3 tablets by mouth daily.   . Cetirizine HCl 10 MG TBDP Take 1 tablet by mouth daily as needed.   . meclizine (ANTIVERT) 12.5 MG tablet Take 1 tablet (12.5 mg total) by mouth 3 (three) times daily as needed for dizziness.  . naproxen sodium (ANAPROX) 220 MG tablet Take 220 mg by mouth 2 (two) times daily with a meal.  . NUTRITIONAL SUPPLEMENTS PO Take 1 tablet by mouth daily. Immune boosters   . triamterene-hydrochlorothiazide (MAXZIDE-25) 37.5-25 MG tablet TAKE ONE TABLET BY MOUTH ONCE DAILY  . Turmeric 500 MG TABS Take 1 tablet by mouth daily.  . Multiple Vitamin (MULTIVITAMIN) tablet Take 1/2 by mouth in the AM and half at night  . [DISCONTINUED] amLODipine (NORVASC) 2.5 MG tablet TAKE 1 TABLET DAILY   No facility-administered encounter medications on file as of 07/28/2016.     Activities of Daily Living In your present state of health, do you have any difficulty performing the following activities: 07/28/2016  Hearing? N  Vision? N  Difficulty concentrating or making decisions? N  Walking or climbing stairs? N  Dressing or bathing? N  Doing  errands, shopping? N  Preparing Food and eating ? N  Using the Toilet? N  In the past six months, have you accidently leaked urine? N  Do you have problems with loss of bowel control? N  Managing your Medications? N  Managing your Finances? N  Housekeeping or managing your Housekeeping? N  Some recent data might be hidden    Patient Care Team: Fayrene Helper, MD as PCP - General    Assessment:    Exercise Activities and Dietary recommendations Current Exercise Habits: Structured exercise class, Type of exercise: Other - see comments (line dancing weekly, exercise bike 3 days a week ), Time (Minutes): 45, Frequency (Times/Week): 4, Weekly Exercise (Minutes/Week): 180, Intensity: Moderate  Goals    . Exercise 3x per week (30 min per time)           Patient would like to start a more routine exercise program and start using an eliptical machine      Fall Risk Fall Risk  07/28/2016 07/24/2015 02/24/2015 07/03/2014 03/20/2013  Falls in the past year? No No Yes No No  Number falls in past yr: - - 2 or more - -  Injury with Fall? - - Yes - -  Risk Factor Category  - - High Fall Risk - -   Depression Screen PHQ 2/9 Scores 07/28/2016 07/03/2014 03/20/2013  PHQ - 2 Score 0 0 0     Cognitive Function: Normal  6CIT Screen 07/28/2016  What Year? 0 points  What month? 0 points  What time? 0 points  Count back from 20 0 points  Months in reverse 0 points  Repeat phrase 0 points  Total Score 0    Immunization History  Administered Date(s) Administered  . Influenza Split 02/04/2011  . Influenza Whole 04/03/2009, 01/01/2010  . Influenza,inj,Quad PF,36+ Mos 03/20/2013, 01/24/2014, 02/24/2015, 02/03/2016  . Pneumococcal Conjugate-13 07/03/2014  . Pneumococcal Polysaccharide-23 03/20/2013  . Td 04/03/2009  . Zoster 02/04/2011   Screening Tests Health Maintenance  Topic Date Due  . COLONOSCOPY  06/05/2014  . INFLUENZA VACCINE  11/24/2016  . MAMMOGRAM  12/09/2017  . TETANUS/TDAP  04/04/2019  . DEXA SCAN  Completed  . Hepatitis C Screening  Completed  . PNA vac Low Risk Adult  Completed      Plan:  I have personally reviewed and addressed the Medicare Annual Wellness questionnaire and have noted the following in the patient's chart:  A. Medical and social history B. Use of alcohol, tobacco or illicit drugs  C. Current medications and supplements D. Functional ability and status E.  Nutritional status F.  Physical activity G. Advance directives H. List of other physicians I.  Hospitalizations, surgeries, and ER visits in previous 12 months J.  [place] to include cognitive, depression, and falls L. Referrals and appointments - Patient would like to discuss with her family before scheduling her colonoscopy. She  will call our office if she needs a referral.  In addition, I have reviewed and discussed with patient certain preventive protocols, quality metrics, and best practice recommendations. A written personalized care plan for preventive services as well as general preventive health recommendations were provided to patient.  Signed,   Stormy Fabian, LPN Lead Nurse Health Advisor

## 2016-07-28 NOTE — Patient Instructions (Addendum)
Adrienne [name] , Thank you for taking time to come for your Medicare Wellness Visit. I appreciate your ongoing commitment to your health goals. Please review the following plan we discussed and let me know if I can assist you in the future.   Screening recommendations/referrals: Colonoscopy: Overdue Mammogram: Up to date, next due 02/2017 Bone Density: Up to date Recommended yearly ophthalmology/optometry visit for glaucoma screening and checkup Recommended yearly dental visit for hygiene and checkup  Vaccinations: Influenza vaccine: Up to date, next due 11/2016 Pneumococcal vaccine: Up to date Tdap vaccine: Up to date, next due 03/2019 Shingles vaccine: Up to date    Advanced directives: Advance directive discussed with you today. I have provided a copy for you to complete at home and have notarized. Once this is complete please bring a copy in to our office so we can scan it into your chart.  Conditions/risks identified: Pre-obese, recommend increasing your routine exercise program at least 3 days a week for 30-45 minutes at a time as tolerated.   Next appointment: Follow up with Dr. Moshe Cipro in 6 months. Follow up in 1 year for your annual wellness visit.   Preventive Care 40 Years and Older, Female Preventive care refers to lifestyle choices and visits with your health care provider that can promote health and wellness. What does preventive care include?  A yearly physical exam. This is also called an annual well check.  Dental exams once or twice a year.  Routine eye exams. Ask your health care provider how often you should have your eyes checked.  Personal lifestyle choices, including:  Daily care of your teeth and gums.  Regular physical activity.  Eating a healthy diet.  Avoiding tobacco and drug use.  Limiting alcohol use.  Practicing safe sex.  Taking low-dose aspirin every day.  Taking vitamin and mineral supplements as recommended by your health care  provider. What happens during an annual well check? The services and screenings done by your health care provider during your annual well check will depend on your age, overall health, lifestyle risk factors, and family history of disease. Counseling  Your health care provider may ask you questions about your:  Alcohol use.  Tobacco use.  Drug use.  Emotional well-being.  Home and relationship well-being.  Sexual activity.  Eating habits.  History of falls.  Memory and ability to understand (cognition).  Work and work Statistician.  Reproductive health. Screening  You may have the following tests or measurements:  Height, weight, and BMI.  Blood pressure.  Lipid and cholesterol levels. These may be checked every 5 years, or more frequently if you are over 70 years old.  Skin check.  Lung cancer screening. You may have this screening every year starting at age 66 if you have a 30-pack-year history of smoking and currently smoke or have quit within the past 15 years.  Fecal occult blood test (FOBT) of the stool. You may have this test every year starting at age 31.  Flexible sigmoidoscopy or colonoscopy. You may have a sigmoidoscopy every 5 years or a colonoscopy every 10 years starting at age 43.  Hepatitis C blood test.  Hepatitis B blood test.  Sexually transmitted disease (STD) testing.  Diabetes screening. This is done by checking your blood sugar (glucose) after you have not eaten for a while (fasting). You may have this done every 1-3 years.  Bone density scan. This is done to screen for osteoporosis. You may have this done starting at age 86.  Mammogram. This may be done every 1-2 years. Talk to your health care provider about how often you should have regular mammograms. Talk with your health care provider about your test results, treatment options, and if necessary, the need for more tests. Vaccines  Your health care provider may recommend certain  vaccines, such as:  Influenza vaccine. This is recommended every year.  Tetanus, diphtheria, and acellular pertussis (Tdap, Td) vaccine. You may need a Td booster every 10 years.  Zoster vaccine. You may need this after age 59.  Pneumococcal 13-valent conjugate (PCV13) vaccine. One dose is recommended after age 22.  Pneumococcal polysaccharide (PPSV23) vaccine. One dose is recommended after age 69. Talk to your health care provider about which screenings and vaccines you need and how often you need them. This information is not intended to replace advice given to you by your health care provider. Make sure you discuss any questions you have with your health care provider. Document Released: 05/09/2015 Document Revised: 12/31/2015 Document Reviewed: 02/11/2015 Elsevier Interactive Patient Education  2017 [place] Prevention in the Home Falls can cause injuries. They can happen to people of all ages. There are many things you can do to make your home safe and to help prevent falls. What can I do on the outside of my home?  Regularly fix the edges of walkways and driveways and fix any cracks.  Remove anything that might make you trip as you walk through a door, such as a raised step or threshold.  Trim any bushes or trees on the path to your home.  Use bright outdoor lighting.  Clear any walking paths of anything that might make someone trip, such as rocks or tools.  Regularly check to see if handrails are loose or broken. Make sure that both sides of any steps have handrails.  Any raised decks and porches should have guardrails on the edges.  Have any leaves, snow, or ice cleared regularly.  Use sand or salt on walking paths during winter.  Clean up any spills in your garage right away. This includes oil or grease spills. What can I do in the bathroom?  Use night lights.  Install grab bars by the toilet and in the tub and shower. Do not use towel bars as grab  bars.  Use non-skid mats or decals in the tub or shower.  If you need to sit down in the shower, use a plastic, non-slip stool.  Keep the floor dry. Clean up any water that spills on the floor as soon as it happens.  Remove soap buildup in the tub or shower regularly.  Attach bath mats securely with double-sided non-slip rug tape.  Do not have throw rugs and other things on the floor that can make you trip. What can I do in the bedroom?  Use night lights.  Make sure that you have a light by your bed that is easy to reach.  Do not use any sheets or blankets that are too big for your bed. They should not hang down onto the floor.  Have a firm chair that has side arms. You can use this for support while you get dressed.  Do not have throw rugs and other things on the floor that can make you trip. What can I do in the kitchen?  Clean up any spills right away.  Avoid walking on wet floors.  Keep items that you use a lot in easy-to-reach places.  If you need to reach  something above you, use a strong step stool that has a grab bar.  Keep electrical cords out of the way.  Do not use floor polish or wax that makes floors slippery. If you must use wax, use non-skid floor wax.  Do not have throw rugs and other things on the floor that can make you trip. What can I do with my stairs?  Do not leave any items on the stairs.  Make sure that there are handrails on both sides of the stairs and use them. Fix handrails that are broken or loose. Make sure that handrails are as long as the stairways.  Check any carpeting to make sure that it is firmly attached to the stairs. Fix any carpet that is loose or worn.  Avoid having throw rugs at the top or bottom of the stairs. If you do have throw rugs, attach them to the floor with carpet tape.  Make sure that you have a light switch at the top of the stairs and the bottom of the stairs. If you do not have them, ask someone to add them for  you. What else can I do to help prevent falls?  Wear shoes that:  Do not have high heels.  Have rubber bottoms.  Are comfortable and fit you well.  Are closed at the toe. Do not wear sandals.  If you use a stepladder:  Make sure that it is fully opened. Do not climb a closed stepladder.  Make sure that both sides of the stepladder are locked into place.  Ask someone to hold it for you, if possible.  Clearly mark and make sure that you can see:  Any grab bars or handrails.  First and last steps.  Where the edge of each step is.  Use tools that help you move around (mobility aids) if they are needed. These include:  Canes.  Walkers.  Scooters.  Crutches.  Turn on the lights when you go into a dark area. Replace any light bulbs as soon as they burn out.  Set up your furniture so you have a clear path. Avoid moving your furniture around.  If any of your floors are uneven, fix them.  If there are any pets around you, be aware of where they are.  Review your medicines with your doctor. Some medicines can make you feel dizzy. This can increase your chance of falling. Ask your doctor what other things that you can do to help prevent falls. This information is not intended to replace advice given to you by your health care provider. Make sure you discuss any questions you have with your health care provider. Document Released: 02/06/2009 Document Revised: 09/18/2015 Document Reviewed: 05/17/2014 Elsevier Interactive Patient Education  2017 [employer].

## 2016-07-29 ENCOUNTER — Telehealth: Payer: Self-pay | Admitting: Family Medicine

## 2016-07-29 DIAGNOSIS — E785 Hyperlipidemia, unspecified: Secondary | ICD-10-CM

## 2016-07-29 DIAGNOSIS — I10 Essential (primary) hypertension: Secondary | ICD-10-CM

## 2016-07-29 NOTE — Telephone Encounter (Signed)
Mailed to have done in may

## 2016-07-29 NOTE — Telephone Encounter (Signed)
Pls call pt and advise her she needs these labs fasting first week in May and order, CBC, chem 7 and tSH Recently here for wellness and not due to come back till Fall but labs needed

## 2016-09-23 ENCOUNTER — Other Ambulatory Visit: Payer: Self-pay | Admitting: Family Medicine

## 2016-09-23 DIAGNOSIS — E785 Hyperlipidemia, unspecified: Secondary | ICD-10-CM | POA: Diagnosis not present

## 2016-09-23 DIAGNOSIS — I10 Essential (primary) hypertension: Secondary | ICD-10-CM | POA: Diagnosis not present

## 2016-09-23 LAB — CBC
HCT: 37.9 % (ref 35.0–45.0)
HEMOGLOBIN: 12.6 g/dL (ref 11.7–15.5)
MCH: 30 pg (ref 27.0–33.0)
MCHC: 33.2 g/dL (ref 32.0–36.0)
MCV: 90.2 fL (ref 80.0–100.0)
MPV: 11.3 fL (ref 7.5–12.5)
Platelets: 273 10*3/uL (ref 140–400)
RBC: 4.2 MIL/uL (ref 3.80–5.10)
RDW: 14 % (ref 11.0–15.0)
WBC: 6.3 10*3/uL (ref 3.8–10.8)

## 2016-09-23 LAB — BASIC METABOLIC PANEL
BUN: 17 mg/dL (ref 7–25)
CHLORIDE: 105 mmol/L (ref 98–110)
CO2: 26 mmol/L (ref 20–31)
Calcium: 9.6 mg/dL (ref 8.6–10.4)
Creat: 0.9 mg/dL (ref 0.50–0.99)
GLUCOSE: 86 mg/dL (ref 65–99)
POTASSIUM: 3.9 mmol/L (ref 3.5–5.3)
SODIUM: 141 mmol/L (ref 135–146)

## 2016-09-23 LAB — TSH: TSH: 1.54 m[IU]/L

## 2016-09-24 LAB — LIPID PANEL
Cholesterol: 244 mg/dL — ABNORMAL HIGH (ref <200)
HDL: 104 mg/dL (ref >50)
LDL CALC: 127 mg/dL — AB (ref <100)
TRIGLYCERIDES: 65 mg/dL (ref <150)
Total CHOL/HDL Ratio: 2.3 Ratio (ref <5.0)
VLDL: 13 mg/dL (ref <30)

## 2016-09-26 ENCOUNTER — Encounter: Payer: Self-pay | Admitting: Family Medicine

## 2016-10-21 ENCOUNTER — Other Ambulatory Visit: Payer: Self-pay | Admitting: Family Medicine

## 2016-10-22 NOTE — Telephone Encounter (Signed)
Seen 10 10 17

## 2016-10-26 ENCOUNTER — Encounter: Payer: Self-pay | Admitting: Family Medicine

## 2017-01-11 ENCOUNTER — Other Ambulatory Visit: Payer: Self-pay | Admitting: Family Medicine

## 2017-02-01 ENCOUNTER — Ambulatory Visit: Payer: Medicare Other | Admitting: Family Medicine

## 2017-03-21 ENCOUNTER — Other Ambulatory Visit: Payer: Self-pay | Admitting: Family Medicine

## 2017-03-21 ENCOUNTER — Telehealth: Payer: Self-pay

## 2017-03-21 DIAGNOSIS — E785 Hyperlipidemia, unspecified: Secondary | ICD-10-CM | POA: Diagnosis not present

## 2017-03-21 DIAGNOSIS — I10 Essential (primary) hypertension: Secondary | ICD-10-CM | POA: Diagnosis not present

## 2017-03-21 DIAGNOSIS — Z1231 Encounter for screening mammogram for malignant neoplasm of breast: Secondary | ICD-10-CM

## 2017-03-21 NOTE — Telephone Encounter (Signed)
Labs ordered.

## 2017-03-22 LAB — COMPLETE METABOLIC PANEL WITH GFR
AG RATIO: 1.4 (calc) (ref 1.0–2.5)
ALBUMIN MSPROF: 4 g/dL (ref 3.6–5.1)
ALT: 15 U/L (ref 6–29)
AST: 21 U/L (ref 10–35)
Alkaline phosphatase (APISO): 88 U/L (ref 33–130)
BUN: 18 mg/dL (ref 7–25)
CALCIUM: 9.8 mg/dL (ref 8.6–10.4)
CO2: 28 mmol/L (ref 20–32)
CREATININE: 0.91 mg/dL (ref 0.50–0.99)
Chloride: 102 mmol/L (ref 98–110)
GFR, EST AFRICAN AMERICAN: 75 mL/min/{1.73_m2} (ref >=60)
GFR, EST NON AFRICAN AMERICAN: 64 mL/min/{1.73_m2} (ref >=60)
GLOBULIN: 2.8 g/dL (ref 1.9–3.7)
Glucose, Bld: 75 mg/dL (ref 65–99)
POTASSIUM: 4.2 mmol/L (ref 3.5–5.3)
SODIUM: 140 mmol/L (ref 135–146)
Total Bilirubin: 0.6 mg/dL (ref 0.2–1.2)
Total Protein: 6.8 g/dL (ref 6.1–8.1)

## 2017-03-22 LAB — LIPID PANEL
CHOL/HDL RATIO: 2.1 (calc) (ref <5.0)
Cholesterol: 238 mg/dL — ABNORMAL HIGH (ref <200)
HDL: 116 mg/dL (ref >50)
LDL Cholesterol (Calc): 105 mg/dL (calc) — ABNORMAL HIGH
NON-HDL CHOLESTEROL (CALC): 122 mg/dL (ref <130)
Triglycerides: 79 mg/dL (ref <150)

## 2017-03-23 ENCOUNTER — Ambulatory Visit (INDEPENDENT_AMBULATORY_CARE_PROVIDER_SITE_OTHER): Payer: Medicare Other | Admitting: Family Medicine

## 2017-03-23 ENCOUNTER — Encounter: Payer: Self-pay | Admitting: Family Medicine

## 2017-03-23 ENCOUNTER — Encounter (HOSPITAL_COMMUNITY): Payer: Self-pay

## 2017-03-23 ENCOUNTER — Ambulatory Visit (HOSPITAL_COMMUNITY)
Admission: RE | Admit: 2017-03-23 | Discharge: 2017-03-23 | Disposition: A | Discharge status: Home / Self Care | Payer: Medicare Other | Source: Ambulatory Visit | Attending: Family Medicine | Admitting: Family Medicine

## 2017-03-23 VITALS — BP 118/80 | HR 72 | Resp 16 | Ht 65.0 in | Wt 160.0 lb

## 2017-03-23 DIAGNOSIS — E785 Hyperlipidemia, unspecified: Secondary | ICD-10-CM

## 2017-03-23 DIAGNOSIS — Z23 Encounter for immunization: Secondary | ICD-10-CM | POA: Diagnosis not present

## 2017-03-23 DIAGNOSIS — M25551 Pain in right hip: Secondary | ICD-10-CM | POA: Diagnosis not present

## 2017-03-23 DIAGNOSIS — G8929 Other chronic pain: Secondary | ICD-10-CM | POA: Diagnosis not present

## 2017-03-23 DIAGNOSIS — L600 Ingrowing nail: Secondary | ICD-10-CM | POA: Diagnosis not present

## 2017-03-23 DIAGNOSIS — I10 Essential (primary) hypertension: Secondary | ICD-10-CM | POA: Diagnosis not present

## 2017-03-23 DIAGNOSIS — Z1231 Encounter for screening mammogram for malignant neoplasm of breast: Secondary | ICD-10-CM | POA: Insufficient documentation

## 2017-03-23 DIAGNOSIS — K5730 Diverticulosis of large intestine without perforation or abscess without bleeding: Secondary | ICD-10-CM | POA: Diagnosis not present

## 2017-03-23 NOTE — Assessment & Plan Note (Signed)
Denies acute pain flare or rectal bleed, colonoscopy past due , again re iterated the need to have this done

## 2017-03-23 NOTE — Assessment & Plan Note (Signed)
7 month h/o ingrown right great toenail, increasing pain, wants this addressed, refer to [place] per pt request

## 2017-03-23 NOTE — Assessment & Plan Note (Signed)
Controlled, no change in medication DASH diet and commitment to daily physical activity for a minimum of 30 minutes discussed and encouraged, as a part of hypertension management. The importance of attaining a healthy weight is also discussed.  BP/Weight 03/23/2017 10/02/2016 07/28/2016 02/03/2016 07/24/2015 36/46/8032 04/27/2480  Systolic BP 500 370 488 891 694 503 888  Diastolic BP 80 82 80 80 78 68 74  Wt. (Lbs) 160 - 160.04 154 159 160 161  BMI 26.63 - 26.63 25.63 26.46 26.63 26.79

## 2017-03-23 NOTE — Progress Notes (Signed)
   Adrienne [name]     MRN: [id number]      DOB: January 29, 1946   HPI Adrienne [name] is here for follow up and re-evaluation of chronic medical conditions, medication management and review of any available recent lab and radiology data.  Preventive health is updated, specifically  Cancer screening and Immunization.  Still needs colonoscopy, states she will get this  . The PT denies any adverse reactions to current medications since the last visit.  7 month h/o right great toe discomfort worsening in the past 3 months, ants to see podiatry for definitive management exercises regularly, and has been more diligent with diet, labs have improved ROS Denies recent fever or chills. Denies sinus pressure, nasal congestion, ear pain or sore throat. Denies chest congestion, productive cough or wheezing. Denies chest pains, palpitations and leg swelling Denies abdominal pain, nausea, vomiting,diarrhea or constipation.   Denies dysuria, frequency, hesitancy or incontinence. Denies uncontrolled  joint pain, swelling and limitation in mobility. Denies headaches, seizures, numbness, or tingling. Denies depression, anxiety or insomnia.   PE  BP 130/80   Pulse 72   Resp 16   Ht 5\' 5"  (1.651 m)   Wt 160 lb (72.6 kg)   SpO2 92%   BMI 26.63 kg/m   Patient alert and oriented and in no cardiopulmonary distress.  HEENT: No facial asymmetry, EOMI,   oropharynx pink and moist.  Neck supple no JVD, no mass.  Chest: Clear to auscultation bilaterally.  CVS: S1, S2 no murmurs, no S3.Regular rate.  ABD: Soft non tender.   Ext: No edema  MS: Adequate ROM spine, shoulders, hips and knees.  Skin: Intact, no ulcerations or rash noted.  Psych: Good eye contact, normal affect. Memory intact not anxious or depressed appearing.  CNS: CN 2-12 intact, power,  normal throughout.no focal deficits noted.   Assessment & Plan  Ingrown right big toenail 7 month h/o ingrown right great toenail, increasing pain,  wants this addressed, refer to [place] per pt request  Essential hypertension Controlled, no change in medication DASH diet and commitment to daily physical activity for a minimum of 30 minutes discussed and encouraged, as a part of hypertension management. The importance of attaining a healthy weight is also discussed.  BP/Weight 03/23/2017 10/02/2016 07/28/2016 02/03/2016 07/24/2015 70/62/3762 11/27/1515  Systolic BP 616 073 710 626 948 546 270  Diastolic BP 80 82 80 80 78 68 74  Wt. (Lbs) 160 - 160.04 154 159 160 161  BMI 26.63 - 26.63 25.63 26.46 26.63 26.79       Hyperlipidemia LDL goal <100 Improving , continue dietary management, has excellent hDL and good exercise routine. No medication management at this time  repeat lab in June  Hip pain, chronic, right Improved with stretching exercise and weight management  Diverticulosis of colon without hemorrhage Denies acute pain flare or rectal bleed, colonoscopy past due , again re iterated the need to have this done

## 2017-03-23 NOTE — Assessment & Plan Note (Signed)
Improved with stretching exercise and weight management

## 2017-03-23 NOTE — Patient Instructions (Addendum)
Wellness with nurse  End April  Flu vaccine roday  Colonoscopy past due  F/U with MD in 1 year with rectal   Need fasting labs, ALL June 1 or shortly after, pls call or send message requesting labs in May.  Please call if you need me before  CONGRATS on improved labs, excellent BP, and good health habits  TIME to declutter, you do NOT NEED this!!   It is important that you exercise regularly at least 30 minutes 5 times a week. If you develop chest pain, have severe difficulty breathing, or feel very tired, stop exercising immediately and seek medical attention

## 2017-03-23 NOTE — Assessment & Plan Note (Signed)
Improving , continue dietary management, has excellent hDL and good exercise routine. No medication management at this time  repeat lab in June

## 2017-04-27 ENCOUNTER — Other Ambulatory Visit: Payer: Self-pay | Admitting: Family Medicine

## 2017-07-13 ENCOUNTER — Other Ambulatory Visit: Payer: Self-pay | Admitting: Family Medicine

## 2017-08-15 ENCOUNTER — Ambulatory Visit (INDEPENDENT_AMBULATORY_CARE_PROVIDER_SITE_OTHER): Payer: Medicare Other

## 2017-08-15 VITALS — BP 118/76 | HR 67 | Temp 98.6°F | Resp 16 | Ht 65.5 in | Wt 166.5 lb

## 2017-08-15 DIAGNOSIS — Z1211 Encounter for screening for malignant neoplasm of colon: Secondary | ICD-10-CM | POA: Diagnosis not present

## 2017-08-15 DIAGNOSIS — Z0000 Encounter for general adult medical examination without abnormal findings: Secondary | ICD-10-CM | POA: Diagnosis not present

## 2017-08-15 NOTE — Patient Instructions (Signed)
Adrienne [name] , Thank you for taking time to come for your Medicare Wellness Visit. I appreciate your ongoing commitment to your health goals. Please review the following plan we discussed and let me know if I can assist you in the future.   Screening recommendations/referrals: Colonoscopy: You are due for this. A referral is being placed. Mammogram: Due 02/2018 Bone Density: Done Recommended yearly ophthalmology/optometry visit for glaucoma screening and checkup Recommended yearly dental visit for hygiene and checkup  Vaccinations: Influenza vaccine: Due Fall 2019 Pneumococcal vaccine: Due 2021 Tdap vaccine: Due 2020 Shingles vaccine: Consider updating. Please call your insurance for coverage    Advanced directives: Discussed today in office.  Conditions/risks identified: None  Next appointment: 03/28/18   Preventive Care 65 Years and Older, Female Preventive care refers to lifestyle choices and visits with your health care provider that can promote health and wellness. What does preventive care include?  A yearly physical exam. This is also called an annual well check.  Dental exams once or twice a year.  Routine eye exams. Ask your health care provider how often you should have your eyes checked.  Personal lifestyle choices, including:  Daily care of your teeth and gums.  Regular physical activity.  Eating a healthy diet.  Avoiding tobacco and drug use.  Limiting alcohol use.  Practicing safe sex.  Taking low-dose aspirin every day.  Taking vitamin and mineral supplements as recommended by your health care provider. What happens during an annual well check? The services and screenings done by your health care provider during your annual well check will depend on your age, overall health, lifestyle risk factors, and family history of disease. Counseling  Your health care provider may ask you questions about your:  Alcohol use.  Tobacco use.  Drug  use.  Emotional well-being.  Home and relationship well-being.  Sexual activity.  Eating habits.  History of falls.  Memory and ability to understand (cognition).  Work and work Statistician.  Reproductive health. Screening  You may have the following tests or measurements:  Height, weight, and BMI.  Blood pressure.  Lipid and cholesterol levels. These may be checked every 5 years, or more frequently if you are over 30 years old.  Skin check.  Lung cancer screening. You may have this screening every year starting at age 47 if you have a 30-pack-year history of smoking and currently smoke or have quit within the past 15 years.  Fecal occult blood test (FOBT) of the stool. You may have this test every year starting at age 57.  Flexible sigmoidoscopy or colonoscopy. You may have a sigmoidoscopy every 5 years or a colonoscopy every 10 years starting at age 21.  Hepatitis C blood test.  Hepatitis B blood test.  Sexually transmitted disease (STD) testing.  Diabetes screening. This is done by checking your blood sugar (glucose) after you have not eaten for a while (fasting). You may have this done every 1-3 years.  Bone density scan. This is done to screen for osteoporosis. You may have this done starting at age 11.  Mammogram. This may be done every 1-2 years. Talk to your health care provider about how often you should have regular mammograms. Talk with your health care provider about your test results, treatment options, and if necessary, the need for more tests. Vaccines  Your health care provider may recommend certain vaccines, such as:  Influenza vaccine. This is recommended every year.  Tetanus, diphtheria, and acellular pertussis (Tdap, Td) vaccine. You may  need a Td booster every 10 years.  Zoster vaccine. You may need this after age 86.  Pneumococcal 13-valent conjugate (PCV13) vaccine. One dose is recommended after age 5.  Pneumococcal polysaccharide  (PPSV23) vaccine. One dose is recommended after age 32. Talk to your health care provider about which screenings and vaccines you need and how often you need them. This information is not intended to replace advice given to you by your health care provider. Make sure you discuss any questions you have with your health care provider. Document Released: 05/09/2015 Document Revised: 12/31/2015 Document Reviewed: 02/11/2015 Elsevier Interactive Patient Education  2017 [place] Prevention in the Home Falls can cause injuries. They can happen to people of all ages. There are many things you can do to make your home safe and to help prevent falls. What can I do on the outside of my home?  Regularly fix the edges of walkways and driveways and fix any cracks.  Remove anything that might make you trip as you walk through a door, such as a raised step or threshold.  Trim any bushes or trees on the path to your home.  Use bright outdoor lighting.  Clear any walking paths of anything that might make someone trip, such as rocks or tools.  Regularly check to see if handrails are loose or broken. Make sure that both sides of any steps have handrails.  Any raised decks and porches should have guardrails on the edges.  Have any leaves, snow, or ice cleared regularly.  Use sand or salt on walking paths during winter.  Clean up any spills in your garage right away. This includes oil or grease spills. What can I do in the bathroom?  Use night lights.  Install grab bars by the toilet and in the tub and shower. Do not use towel bars as grab bars.  Use non-skid mats or decals in the tub or shower.  If you need to sit down in the shower, use a plastic, non-slip stool.  Keep the floor dry. Clean up any water that spills on the floor as soon as it happens.  Remove soap buildup in the tub or shower regularly.  Attach bath mats securely with double-sided non-slip rug tape.  Do not have  throw rugs and other things on the floor that can make you trip. What can I do in the bedroom?  Use night lights.  Make sure that you have a light by your bed that is easy to reach.  Do not use any sheets or blankets that are too big for your bed. They should not hang down onto the floor.  Have a firm chair that has side arms. You can use this for support while you get dressed.  Do not have throw rugs and other things on the floor that can make you trip. What can I do in the kitchen?  Clean up any spills right away.  Avoid walking on wet floors.  Keep items that you use a lot in easy-to-reach places.  If you need to reach something above you, use a strong step stool that has a grab bar.  Keep electrical cords out of the way.  Do not use floor polish or wax that makes floors slippery. If you must use wax, use non-skid floor wax.  Do not have throw rugs and other things on the floor that can make you trip. What can I do with my stairs?  Do not leave any items on  the stairs.  Make sure that there are handrails on both sides of the stairs and use them. Fix handrails that are broken or loose. Make sure that handrails are as long as the stairways.  Check any carpeting to make sure that it is firmly attached to the stairs. Fix any carpet that is loose or worn.  Avoid having throw rugs at the top or bottom of the stairs. If you do have throw rugs, attach them to the floor with carpet tape.  Make sure that you have a light switch at the top of the stairs and the bottom of the stairs. If you do not have them, ask someone to add them for you. What else can I do to help prevent falls?  Wear shoes that:  Do not have high heels.  Have rubber bottoms.  Are comfortable and fit you well.  Are closed at the toe. Do not wear sandals.  If you use a stepladder:  Make sure that it is fully opened. Do not climb a closed stepladder.  Make sure that both sides of the stepladder are  locked into place.  Ask someone to hold it for you, if possible.  Clearly mark and make sure that you can see:  Any grab bars or handrails.  First and last steps.  Where the edge of each step is.  Use tools that help you move around (mobility aids) if they are needed. These include:  Canes.  Walkers.  Scooters.  Crutches.  Turn on the lights when you go into a dark area. Replace any light bulbs as soon as they burn out.  Set up your furniture so you have a clear path. Avoid moving your furniture around.  If any of your floors are uneven, fix them.  If there are any pets around you, be aware of where they are.  Review your medicines with your doctor. Some medicines can make you feel dizzy. This can increase your chance of falling. Ask your doctor what other things that you can do to help prevent falls. This information is not intended to replace advice given to you by your health care provider. Make sure you discuss any questions you have with your health care provider. Document Released: 02/06/2009 Document Revised: 09/18/2015 Document Reviewed: 05/17/2014 Elsevier Interactive Patient Education  2017 [place] for Adults  A healthy lifestyle and preventive care can promote health and wellness. Preventive health guidelines for adults include the following key practices.  . A routine yearly physical is a good way to check with your health care provider about your health and preventive screening. It is a chance to share any concerns and updates on your health and to receive a thorough exam.  . Visit your dentist for a routine exam and preventive care every 6 months. Brush your teeth twice a day and floss once a day. Good oral hygiene prevents tooth decay and gum disease.  . The frequency of eye exams is based on your age, health, family medical history, use  of contact lenses, and other factors. Follow your health care provider's ecommendations for  frequency of eye exams.  . Eat a healthy diet. Foods like vegetables, fruits, whole grains, low-fat dairy products, and lean protein foods contain the nutrients you need without too many calories. Decrease your intake of foods high in solid fats, added sugars, and salt. Eat the right amount of calories for you. Get information about a proper diet from your health care provider, if necessary.  Marland Kitchen  Regular physical exercise is one of the most important things you can do for your health. Most adults should get at least 150 minutes of moderate-intensity exercise (any activity that increases your heart rate and causes you to sweat) each week. In addition, most adults need muscle-strengthening exercises on 2 or more days a week.  Silver Sneakers may be a benefit available to you. To determine eligibility, you may visit the website: www.silversneakers.com or contact program at 413-237-5831 Mon-Fri between 8AM-8PM.   . Maintain a healthy weight. The body mass index (BMI) is a screening tool to identify possible weight problems. It provides an estimate of body fat based on height and weight. Your health care provider can find your BMI and can help you achieve or maintain a healthy weight.   For adults 20 years and older: ? A BMI below 18.5 is considered underweight. ? A BMI of 18.5 to 24.9 is normal. ? A BMI of 25 to 29.9 is considered overweight. ? A BMI of 30 and above is considered obese.   . Maintain normal blood lipids and cholesterol levels by exercising and minimizing your intake of saturated fat. Eat a balanced diet with plenty of fruit and vegetables. Blood tests for lipids and cholesterol should begin at age 63 and be repeated every 5 years. If your lipid or cholesterol levels are high, you are over 50, or you are at high risk for heart disease, you may need your cholesterol levels checked more frequently. Ongoing high lipid and cholesterol levels should be treated with medicines if diet and  exercise are not working.  . If you smoke, find out from your health care provider how to quit. If you do not use tobacco, please do not start.  . If you choose to drink alcohol, please do not consume more than 2 drinks per day. One drink is considered to be 12 ounces (355 mL) of beer, 5 ounces (148 mL) of wine, or 1.5 ounces (44 mL) of liquor.  . If you are 2-4 years old, ask your health care provider if you should take aspirin to prevent strokes.  . Use sunscreen. Apply sunscreen liberally and repeatedly throughout the day. You should seek shade when your shadow is shorter than you. Protect yourself by wearing long sleeves, pants, a wide-brimmed hat, and sunglasses year round, whenever you are outdoors.  . Once a month, do a whole body skin exam, using a mirror to look at the skin on your back. Tell your health care provider of new moles, moles that have irregular borders, moles that are larger than a pencil eraser, or moles that have changed in shape or color.

## 2017-08-15 NOTE — Progress Notes (Signed)
Subjective:   Adrienne [name] is a 71 y.o. female who presents for Medicare Annual (Subsequent) preventive examination.  Review of Systems:   Cardiac Risk Factors include: advanced age (>61men, >80 women);hypertension     Objective:     Vitals: BP 118/76 (BP Location: Left Arm, Patient Position: Sitting, Cuff Size: Normal)   Pulse 67   Temp 98.6 F (37 C) (Temporal)   Resp 16   Ht 5' 5.5" (1.664 m)   Wt 166 lb 8 oz (75.5 kg)   SpO2 98%   BMI 27.29 kg/m   Body mass index is 27.29 kg/m.  Advanced Directives 08/15/2017 07/28/2016  Does Patient Have a Medical Advance Directive? No No  Would patient like information on creating a medical advance directive? No - Patient declined Yes (MAU/Ambulatory/Procedural Areas - Information given)    Tobacco Social History   Tobacco Use  Smoking Status Never Smoker  Smokeless Tobacco Never Used     Counseling given: Yes   Clinical Intake:  Pre-visit preparation completed: Yes  Pain : No/denies pain Pain Score: 0-No pain     Nutritional Status: BMI 25 -29 Overweight Diabetes: No  How often do you need to have someone help you when you read instructions, pamphlets, or other written materials from your doctor or pharmacy?: 1 - Never  Interpreter Needed?: No     Past Medical History:  Diagnosis Date  . Allergy   . Diverticulosis 05/2009  . Hyperlipidemia   . Hypertension    Past Surgical History:  Procedure Laterality Date  . ABDOMINAL HYSTERECTOMY  1990   for fibroids ovaries remain   . CHOLECYSTECTOMY  1980  . COLONOSCOPY  05/2009   rare diverticula, small internal hemorrhoids. Next colonoscopy in 5-10 yrs.  . left nipple inversion bx benign  2000   Family History  Problem Relation Age of Onset  . Kidney failure Father   . Arthritis Father   . Heart disease Sister   . Hypertension Sister   . Arthritis Sister   . Breast cancer Sister   . Diabetes Sister   . Arthritis Sister   . Lung cancer Brother    was a smoker  . Arthritis Brother   . Arthritis Mother   . Prostate cancer Brother    Social History   Socioeconomic History  . Marital status: Single    Spouse name: Not on file  . Number of children: 0  . Years of education: Not on file  . Highest education level: Not on file  Occupational History  . Occupation: retired    Fish farm manager: RETIRED  Social Needs  . Financial resource strain: Not hard at all  . Food insecurity:    Worry: Never true    Inability: Never true  . Transportation needs:    Medical: No    Non-medical: No  Tobacco Use  . Smoking status: Never Smoker  . Smokeless tobacco: Never Used  Substance and Sexual Activity  . Alcohol use: Yes    Comment: rarely   . Drug use: No  . Sexual activity: Not Currently    Birth control/protection: Surgical    Comment: not asked  Lifestyle  . Physical activity:    Days per week: 3 days    Minutes per session: 60 min  . Stress: Not at all  Relationships  . Social connections:    Talks on phone: More than three times a week    Gets together: Once a week    Attends religious service:  More than 4 times per year    Active member of club or organization: Yes    Attends meetings of clubs or organizations: More than 4 times per year    Relationship status: Never married  Other Topics Concern  . Not on file  Social History Narrative  . Not on file    Outpatient Encounter Medications as of 08/15/2017  Medication Sig  . amLODipine (NORVASC) 2.5 MG tablet TAKE 1 TABLET BY MOUTH ONCE DAILY  . aspirin EC 81 MG tablet Take 1 tablet (81 mg total) by mouth daily.  Marland Kitchen BIOTIN 5000 PO Take 2 capsules by mouth daily.   Marland Kitchen CALCIUM-MAGNESUIUM-ZINC 333-133-8.3 MG TABS Take 3 tablets by mouth daily.   . Cetirizine HCl 10 MG TBDP Take 1 tablet by mouth daily as needed.   . meclizine (ANTIVERT) 12.5 MG tablet Take 1 tablet (12.5 mg total) by mouth 3 (three) times daily as needed for dizziness.  . Multiple Vitamin (MULTIVITAMIN) tablet  Take 1/2 by mouth in the AM and half at night  . naproxen sodium (ANAPROX) 220 MG tablet Take 220 mg by mouth 2 (two) times daily with a meal.  . NUTRITIONAL SUPPLEMENTS PO Take 1 tablet by mouth daily. Immune boosters   . triamterene-hydrochlorothiazide (MAXZIDE-25) 37.5-25 MG tablet TAKE 1 TABLET BY MOUTH ONCE DAILY  . Turmeric 500 MG TABS Take 1 tablet by mouth daily.  . [DISCONTINUED] azelastine (ASTELIN) 0.1 % nasal spray Place 2 sprays into both nostrils 2 (two) times daily. Use in each nostril as directed (Patient taking differently: Place 2 sprays into both nostrils 2 (two) times daily as needed. Use in each nostril as directed)   No facility-administered encounter medications on file as of 08/15/2017.     Activities of Daily Living In your present state of health, do you have any difficulty performing the following activities: 08/15/2017  Hearing? N  Vision? N  Difficulty concentrating or making decisions? Y  Walking or climbing stairs? N  Dressing or bathing? N  Doing errands, shopping? N  Preparing Food and eating ? N  Using the Toilet? N  In the past six months, have you accidently leaked urine? N  Do you have problems with loss of bowel control? N  Managing your Medications? N  Managing your Finances? N  Housekeeping or managing your Housekeeping? N  Some recent data might be hidden    Patient Care Team: Fayrene Helper, MD as PCP - General    Assessment:   This is a routine wellness examination for Adrienne [name].  Exercise Activities and Dietary recommendations Current Exercise Habits: Home exercise routine;Structured exercise class, Time (Minutes): 60, Frequency (Times/Week): 3, Weekly Exercise (Minutes/Week): 180, Intensity: Moderate, Exercise limited by: None identified  Goals    . Exercise 3x per week (30 min per time)     Patient would like to start a more routine exercise program and start using an eliptical machine       Fall Risk Fall Risk  08/15/2017  07/28/2016 07/24/2015 02/24/2015 07/03/2014  Falls in the past year? No No No Yes No  Number falls in past yr: - - - 2 or more -  Injury with Fall? - - - Yes -  Risk Factor Category  - - - High Fall Risk -   Is the patient's home free of loose throw rugs in walkways, pet beds, electrical cords, etc?   yes      Grab bars in the bathroom? yes  Handrails on the stairs?   yes      Adequate lighting?   yes  Depression Screen PHQ 2/9 Scores 08/15/2017 07/28/2016 07/03/2014 03/20/2013  PHQ - 2 Score 0 0 0 0     Cognitive Function     6CIT Screen 08/15/2017 07/28/2016  What Year? 0 points 0 points  What month? 0 points 0 points  What time? 0 points 0 points  Count back from 20 0 points 0 points  Months in reverse 0 points 0 points  Repeat phrase 0 points 0 points  Total Score 0 0    Immunization History  Administered Date(s) Administered  . Influenza Split 02/04/2011  . Influenza Whole 04/03/2009, 01/01/2010  . Influenza,inj,Quad PF,6+ Mos 03/20/2013, 01/24/2014, 02/24/2015, 02/03/2016, 03/23/2017  . Pneumococcal Conjugate-13 07/03/2014  . Pneumococcal Polysaccharide-23 03/20/2013  . Td 04/03/2009  . Zoster 02/04/2011    Qualifies for Shingles Vaccine? Yes  Screening Tests Health Maintenance  Topic Date Due  . COLONOSCOPY  06/05/2014  . INFLUENZA VACCINE  11/24/2017  . MAMMOGRAM  03/24/2019  . TETANUS/TDAP  04/04/2019  . DEXA SCAN  Completed  . Hepatitis C Screening  Completed  . PNA vac Low Risk Adult  Completed    Cancer Screenings: Lung: Low Dose CT Chest recommended if Age 29-80 years, 30 pack-year currently smoking OR have quit w/in 15years. Patient does not qualify. Breast:  Up to date on Mammogram? Yes   Up to date of Bone Density/Dexa? Yes Colorectal: She is due for this. A referral to GI will be discussed and placed per patient's discretion.       Plan:      I have personally reviewed and noted the following in the patient's chart:   . Medical and social  history . Use of alcohol, tobacco or illicit drugs  . Current medications and supplements . Functional ability and status . Nutritional status . Physical activity . Advanced directives . List of other physicians . Hospitalizations, surgeries, and ER visits in previous 12 months . Vitals . Screenings to include cognitive, depression, and falls . Referrals and appointments  In addition, I have reviewed and discussed with patient certain preventive protocols, quality metrics, and best practice recommendations. A written personalized care plan for preventive services as well as general preventive health recommendations were provided to patient.     Merceda Elks, LPN  [id number]

## 2017-10-20 ENCOUNTER — Encounter: Payer: Self-pay | Admitting: Family Medicine

## 2017-10-21 ENCOUNTER — Encounter: Payer: Self-pay | Admitting: Family Medicine

## 2017-10-30 ENCOUNTER — Other Ambulatory Visit: Payer: Self-pay | Admitting: Family Medicine

## 2018-01-20 ENCOUNTER — Other Ambulatory Visit: Payer: Self-pay | Admitting: Family Medicine

## 2018-03-27 ENCOUNTER — Telehealth: Payer: Self-pay | Admitting: *Deleted

## 2018-03-27 ENCOUNTER — Other Ambulatory Visit: Payer: Self-pay | Admitting: Family Medicine

## 2018-03-27 DIAGNOSIS — I10 Essential (primary) hypertension: Secondary | ICD-10-CM | POA: Diagnosis not present

## 2018-03-27 DIAGNOSIS — E559 Vitamin D deficiency, unspecified: Secondary | ICD-10-CM

## 2018-03-27 DIAGNOSIS — Z1231 Encounter for screening mammogram for malignant neoplasm of breast: Secondary | ICD-10-CM

## 2018-03-27 DIAGNOSIS — E785 Hyperlipidemia, unspecified: Secondary | ICD-10-CM

## 2018-03-27 NOTE — Telephone Encounter (Signed)
What labs does she need? Has appt tomorrow

## 2018-03-27 NOTE — Telephone Encounter (Signed)
[facility], fasting lipid, cmp and EGFR, tSH and vit D please

## 2018-03-27 NOTE — Addendum Note (Signed)
Addended by: Eual Fines on: 03/27/2018 04:30 PM   Modules accepted: Orders

## 2018-03-27 NOTE — Telephone Encounter (Signed)
Pt has an appt tomorrow wanted to know if there were any labs that were due before then and if so could she pick up the lab paperwork. Can be reached at [phone]

## 2018-03-27 NOTE — Telephone Encounter (Signed)
Pt called back requesting the fasting labs be sent [facility] for appt tomorrow.

## 2018-03-28 ENCOUNTER — Ambulatory Visit (INDEPENDENT_AMBULATORY_CARE_PROVIDER_SITE_OTHER): Payer: Medicare Other | Admitting: Family Medicine

## 2018-03-28 ENCOUNTER — Encounter: Payer: Self-pay | Admitting: Family Medicine

## 2018-03-28 VITALS — BP 130/84 | HR 78 | Resp 16 | Ht 65.5 in | Wt 166.0 lb

## 2018-03-28 DIAGNOSIS — M25551 Pain in right hip: Secondary | ICD-10-CM | POA: Diagnosis not present

## 2018-03-28 DIAGNOSIS — I10 Essential (primary) hypertension: Secondary | ICD-10-CM | POA: Diagnosis not present

## 2018-03-28 DIAGNOSIS — Z23 Encounter for immunization: Secondary | ICD-10-CM | POA: Diagnosis not present

## 2018-03-28 DIAGNOSIS — E785 Hyperlipidemia, unspecified: Secondary | ICD-10-CM

## 2018-03-28 DIAGNOSIS — Z780 Asymptomatic menopausal state: Secondary | ICD-10-CM

## 2018-03-28 DIAGNOSIS — G8929 Other chronic pain: Secondary | ICD-10-CM | POA: Diagnosis not present

## 2018-03-28 DIAGNOSIS — E663 Overweight: Secondary | ICD-10-CM

## 2018-03-28 LAB — COMPLETE METABOLIC PANEL WITH GFR
AG RATIO: 1.6 (calc) (ref 1.0–2.5)
ALBUMIN MSPROF: 4.3 g/dL (ref 3.6–5.1)
ALKALINE PHOSPHATASE (APISO): 93 U/L (ref 33–130)
ALT: 13 U/L (ref 6–29)
AST: 19 U/L (ref 10–35)
BUN: 16 mg/dL (ref 7–25)
CO2: 28 mmol/L (ref 20–32)
CREATININE: 0.88 mg/dL (ref 0.60–0.93)
Calcium: 10 mg/dL (ref 8.6–10.4)
Chloride: 101 mmol/L (ref 98–110)
GFR, Est African American: 77 mL/min/{1.73_m2} (ref >=60)
GFR, Est Non African American: 67 mL/min/{1.73_m2} (ref >=60)
GLOBULIN: 2.7 g/dL (ref 1.9–3.7)
Glucose, Bld: 77 mg/dL (ref 65–99)
POTASSIUM: 3.6 mmol/L (ref 3.5–5.3)
SODIUM: 139 mmol/L (ref 135–146)
Total Bilirubin: 0.6 mg/dL (ref 0.2–1.2)
Total Protein: 7 g/dL (ref 6.1–8.1)

## 2018-03-28 LAB — CBC
HCT: 37.1 % (ref 35.0–45.0)
HEMOGLOBIN: 12.4 g/dL (ref 11.7–15.5)
MCH: 29 pg (ref 27.0–33.0)
MCHC: 33.4 g/dL (ref 32.0–36.0)
MCV: 86.7 fL (ref 80.0–100.0)
MPV: 12.2 fL (ref 7.5–12.5)
Platelets: 283 10*3/uL (ref 140–400)
RBC: 4.28 10*6/uL (ref 3.80–5.10)
RDW: 13.2 % (ref 11.0–15.0)
WBC: 7.4 10*3/uL (ref 3.8–10.8)

## 2018-03-28 LAB — LIPID PANEL
CHOL/HDL RATIO: 2.9 (calc) (ref <5.0)
CHOLESTEROL: 270 mg/dL — AB (ref <200)
HDL: 93 mg/dL (ref >50)
LDL CHOLESTEROL (CALC): 158 mg/dL — AB
Non-HDL Cholesterol (Calc): 177 mg/dL (calc) — ABNORMAL HIGH (ref <130)
TRIGLYCERIDES: 89 mg/dL (ref <150)

## 2018-03-28 LAB — VITAMIN D 25 HYDROXY (VIT D DEFICIENCY, FRACTURES): VIT D 25 HYDROXY: 34 ng/mL (ref 30–100)

## 2018-03-28 LAB — TSH: TSH: 1.51 m[IU]/L (ref 0.40–4.50)

## 2018-03-28 MED ORDER — MELOXICAM 15 MG PO TABS: ORAL_TABLET | ORAL | 0 refills | Status: DC

## 2018-03-28 MED ORDER — PREDNISONE 5 MG PO TABS: ORAL_TABLET | ORAL | 0 refills | Status: DC

## 2018-03-28 NOTE — Assessment & Plan Note (Signed)
Deteriorated. Patient re-educated about  the importance of commitment to a  minimum of 150 minutes of exercise per week.  The importance of healthy food choices with portion control discussed. Encouraged to start a food diary, count calories and to consider  joining a support group. Sample diet sheets offered. Goals set by the patient for the next several months.   Weight /BMI 03/28/2018 08/15/2017 03/23/2017  WEIGHT 166 lb 166 lb 8 oz 160 lb  HEIGHT 5' 5.5" 5' 5.5" 5\' 5"   BMI 27.2 kg/m2 27.29 kg/m2 26.63 kg/m2

## 2018-03-28 NOTE — Progress Notes (Signed)
Chakara Bognar     MRN: [id number]      DOB: 03-11-1946   HPI Ms. Sculley is here for follow up and re-evaluation of chronic medical conditions, medication management and review of any available recent lab and radiology data.  Preventive health is updated, specifically  Cancer screening and Immunization.   Questions or concerns regarding consultations or procedures which the PT has had in the interim are  addressed. The PT denies any adverse reactions to current medications since the last visit.  C/o insufficient exercise and poor eating habits with weight gain ongoing 2 weeks ago after walking in a parade had severe low back and right hip pain, getting better but still not completely resolvd ROS Denies recent fever or chills. Denies sinus pressure, nasal congestion, ear pain or sore throat.has a " few snifles" Denies chest congestion, productive cough or wheezing. Denies chest pains, palpitations and leg swelling Denies abdominal pain, nausea, vomiting,diarrhea or constipation.   Denies dysuria, frequency, hesitancy or incontinence.  Denies headaches, seizures, numbness, or tingling. Denies depression, anxiety or insomnia. Denies skin break down or rash.   PE  BP 130/84   Pulse 78   Resp 16   Ht 5' 5.5" (1.664 m)   Wt 166 lb (75.3 kg)   SpO2 99%   BMI 27.20 kg/m   Patient alert and oriented and in no cardiopulmonary distress.  HEENT: No facial asymmetry, EOMI,   oropharynx pink and moist.  Neck supple no JVD, no mass.Nasal mucosa edematous   Chest: Clear to auscultation bilaterally.  CVS: S1, S2 no murmurs, no S3.Regular rate.  ABD: Soft non tender.   Ext: No edema  MS: Adequate though reduced  ROM spine,  adequate in shoulders,and reduced in hips and  knees.  Skin: Intact, no ulcerations or rash noted.  Psych: Good eye contact, normal affect. Memory intact not anxious or depressed appearing.  CNS: CN 2-12 intact, power,  normal throughout.no focal deficits  noted.   Assessment & Plan  Essential hypertension Controlled, no change in medication DASH diet and commitment to daily physical activity for a minimum of 30 minutes discussed and encouraged, as a part of hypertension management. The importance of attaining a healthy weight is also discussed.  BP/Weight 03/28/2018 08/15/2017 07/23/2017 03/23/2017 10/02/2016 07/28/2016 82/99/3716  Systolic BP 967 893 810 175 102 585 277  Diastolic BP 84 76 85 80 82 80 80  Wt. (Lbs) 166 166.5 - 160 - 160.04 154  BMI 27.2 27.29 - 26.63 - 26.63 25.63       Hip pain, chronic, right recent flare , will prescribe short anti inflammatory course for use if needed, was 10  Two weeks ago, now a 2  Overweight (BMI 25.0-29.9) Deteriorated. Patient re-educated about  the importance of commitment to a  minimum of 150 minutes of exercise per week.  The importance of healthy food choices with portion control discussed. Encouraged to start a food diary, count calories and to consider  joining a support group. Sample diet sheets offered. Goals set by the patient for the next several months.   Weight /BMI 03/28/2018 08/15/2017 03/23/2017  WEIGHT 166 lb 166 lb 8 oz 160 lb  HEIGHT 5' 5.5" 5' 5.5" 5\' 5"   BMI 27.2 kg/m2 27.29 kg/m2 26.63 kg/m2      Hyperlipidemia LDL goal <100 Hyperlipidemia:Low fat diet discussed and encouraged.   Lipid Panel  Lab Results  Component Value Date   CHOL 270 (H) 03/27/2018   HDL 93 03/27/2018  [place] 158 (H) 03/27/2018   TRIG 89 03/27/2018   CHOLHDL 2.9 03/27/2018    markedly elevated cholesterol , total and LDL persists, will change diet, has high hDL also which is protective    Need for immunization against influenza After obtaining informed consent, the vaccine is  administered by LPN.

## 2018-03-28 NOTE — Assessment & Plan Note (Signed)
recent flare , will prescribe short anti inflammatory course for use if needed, was 10  Two weeks ago, now a 2

## 2018-03-28 NOTE — Patient Instructions (Addendum)
Wellness with nurse end May  Flu vaccine today   Fasting lipid and chem 7 and EGFR 1 week before May appt  Annual appt with MD in 1 year, call if  You need me before  Lifestyle changes as discussed    Weight loss goal of 6 pounds by end May  Bone density to be scheduled appt given today  Pls send a message for help if unable to follow through today on getting colonoscopy arrangements  made

## 2018-03-28 NOTE — Assessment & Plan Note (Signed)
After obtaining informed consent, the vaccine is  administered by LPN.  

## 2018-03-28 NOTE — Assessment & Plan Note (Signed)
Hyperlipidemia:Low fat diet discussed and encouraged.   Lipid Panel  Lab Results  Component Value Date   CHOL 270 (H) 03/27/2018   HDL 93 03/27/2018   LDLCALC 158 (H) 03/27/2018   TRIG 89 03/27/2018   CHOLHDL 2.9 03/27/2018    markedly elevated cholesterol , total and LDL persists, will change diet, has high hDL also which is protective

## 2018-03-28 NOTE — Assessment & Plan Note (Signed)
Controlled, no change in medication DASH diet and commitment to daily physical activity for a minimum of 30 minutes discussed and encouraged, as a part of hypertension management. The importance of attaining a healthy weight is also discussed.  BP/Weight 03/28/2018 08/15/2017 07/23/2017 03/23/2017 10/02/2016 07/28/2016 83/50/7573  Systolic BP 225 672 091 980 221 798 102  Diastolic BP 84 76 85 80 82 80 80  Wt. (Lbs) 166 166.5 - 160 - 160.04 154  BMI 27.2 27.29 - 26.63 - 26.63 25.63

## 2018-03-31 ENCOUNTER — Ambulatory Visit (HOSPITAL_COMMUNITY)
Admission: RE | Admit: 2018-03-31 | Discharge: 2018-03-31 | Disposition: A | Discharge status: Home / Self Care | Payer: Medicare Other | Source: Ambulatory Visit | Attending: Family Medicine | Admitting: Family Medicine

## 2018-03-31 DIAGNOSIS — Z1231 Encounter for screening mammogram for malignant neoplasm of breast: Secondary | ICD-10-CM | POA: Diagnosis not present

## 2018-04-04 ENCOUNTER — Ambulatory Visit (HOSPITAL_COMMUNITY)
Admission: RE | Admit: 2018-04-04 | Discharge: 2018-04-04 | Disposition: A | Discharge status: Home / Self Care | Payer: Medicare Other | Source: Ambulatory Visit | Attending: Family Medicine | Admitting: Family Medicine

## 2018-04-04 DIAGNOSIS — Z780 Asymptomatic menopausal state: Secondary | ICD-10-CM | POA: Insufficient documentation

## 2018-04-04 DIAGNOSIS — Z13820 Encounter for screening for osteoporosis: Secondary | ICD-10-CM | POA: Diagnosis not present

## 2018-04-05 ENCOUNTER — Encounter: Payer: Self-pay | Admitting: Family Medicine

## 2018-05-06 ENCOUNTER — Other Ambulatory Visit: Payer: Self-pay | Admitting: Family Medicine

## 2018-07-12 ENCOUNTER — Ambulatory Visit (INDEPENDENT_AMBULATORY_CARE_PROVIDER_SITE_OTHER): Payer: Medicare Other | Admitting: Nurse Practitioner

## 2018-07-12 ENCOUNTER — Other Ambulatory Visit: Payer: Self-pay

## 2018-07-12 ENCOUNTER — Encounter: Payer: Self-pay | Admitting: Nurse Practitioner

## 2018-07-12 DIAGNOSIS — Z860100 Personal history of colon polyps, unspecified: Secondary | ICD-10-CM | POA: Insufficient documentation

## 2018-07-12 MED ORDER — PEG 3350-KCL-NA BICARB-NACL 420 G PO SOLR: 4000.0000 mL | ORAL | 0 refills | Status: DC

## 2018-07-12 NOTE — Progress Notes (Addendum)
REVIEWED-NO ADDITIONAL RECOMMENDATIONS.  Primary Care Physician:  Fayrene Helper, MD Primary Gastroenterologist:  Dr. Oneida Alar  Chief Complaint  Patient presents with  . Consult    TCS    HPI:   Adrienne [name] is a 73 y.o. female who presents on referral from primary care for colonoscopy.  Reviewed information provided with referral including clinical support visit with nursing staff dated 08/15/2017 which indicated patient is due for colonoscopy.  Most recent colonoscopy completed 06/02/2009 which found rare diverticula, small internal hemorrhoids, otherwise normal.  Recommended repeat colonoscopy in 5 to 10 years due to personal history of colon polyps.  Today she states she's doing ok overall. Denies abdominal pain, N/V, hematochezia, melena, fever, chills, unintentional weight loss. Has chronic constipation which is well managed with ground flaxmeal. Denies chest pain, dyspnea, dizziness, lightheadedness, syncope, near syncope. Denies any other upper or lower GI symptoms.  Past Medical History:  Diagnosis Date  . Allergy   . Diverticulosis 05/2009  . Hyperlipidemia   . Hypertension     Past Surgical History:  Procedure Laterality Date  . ABDOMINAL HYSTERECTOMY  1990   for fibroids ovaries remain   . CHOLECYSTECTOMY  1980  . COLONOSCOPY  05/2009   rare diverticula, small internal hemorrhoids. Next colonoscopy in 5-10 yrs.  . left nipple inversion bx benign  2000    Current Outpatient Medications  Medication Sig Dispense Refill  . amLODipine (NORVASC) 2.5 MG tablet TAKE 1 TABLET BY MOUTH ONCE DAILY 90 tablet 0  . aspirin EC 81 MG tablet Take 1 tablet (81 mg total) by mouth daily. 150 tablet 2  . BIOTIN 5000 PO Take 2 capsules by mouth daily.     Marland Kitchen CALCIUM-MAGNESUIUM-ZINC 333-133-8.3 MG TABS Take 3 tablets by mouth daily.     . Cetirizine HCl 10 MG TBDP Take 1 tablet by mouth daily as needed.  30 tablet 5  . [place], Angelica sinensis, (DONG QUAI PO) Take by mouth  daily.    . Multiple Vitamin (MULTIVITAMIN) tablet Take 1/2 by mouth in the AM and half at night    . naproxen sodium (ANAPROX) 220 MG tablet Take 220 mg by mouth 2 (two) times daily with a meal.    . triamterene-hydrochlorothiazide (MAXZIDE-25) 37.5-25 MG tablet TAKE 1 TABLET BY MOUTH ONCE DAILY 90 tablet 1  . Turmeric 500 MG TABS Take 1 tablet by mouth daily.    . meloxicam (MOBIC) 15 MG tablet One tablet once daily as needed for flare of arthritic pain, (Patient not taking: Reported on 07/12/2018) 30 tablet 0  . NUTRITIONAL SUPPLEMENTS PO Take 1 tablet by mouth daily. Immune boosters     . predniSONE (DELTASONE) 5 MG tablet One tablet two times daily for 5 days for acute back or hip pain (Patient not taking: Reported on 07/12/2018) 10 tablet 0   No current facility-administered medications for this visit.     Allergies as of 07/12/2018 - Review Complete 07/12/2018  Allergen Reaction Noted  . Valsartan Shortness Of Breath 03/20/2013  . Ace inhibitors Cough 03/20/2013    Family History  Problem Relation Age of Onset  . Kidney failure Father   . Arthritis Father   . Heart disease Sister   . Hypertension Sister   . Arthritis Sister   . Breast cancer Sister   . Diabetes Sister   . Arthritis Sister   . Lung cancer Brother        was a smoker  . Arthritis Brother   .  Arthritis Mother   . Prostate cancer Brother     Social History   Socioeconomic History  . Marital status: Single    Spouse name: Not on file  . Number of children: 0  . Years of education: Not on file  . Highest education level: Not on file  Occupational History  . Occupation: retired    Fish farm manager: RETIRED  Social Needs  . Financial resource strain: Not hard at all  . Food insecurity:    Worry: Never true    Inability: Never true  . Transportation needs:    Medical: No    Non-medical: No  Tobacco Use  . Smoking status: Never Smoker  . Smokeless tobacco: Never Used  Substance and Sexual Activity  .  Alcohol use: Yes    Comment: rarely   . Drug use: No  . Sexual activity: Not Currently    Birth control/protection: Surgical    Comment: not asked  Lifestyle  . Physical activity:    Days per week: 3 days    Minutes per session: 60 min  . Stress: Not at all  Relationships  . Social connections:    Talks on phone: More than three times a week    Gets together: Once a week    Attends religious service: More than 4 times per year    Active member of club or organization: Yes    Attends meetings of clubs or organizations: More than 4 times per year    Relationship status: Never married  . Intimate partner violence:    Fear of current or ex partner: No    Emotionally abused: No    Physically abused: No    Forced sexual activity: No  Other Topics Concern  . Not on file  Social History Narrative  . Not on file    Review of Systems: Complete ROS negative except as per HPI.    Physical Exam: BP 120/80   Pulse 75   Temp (!) 97.4 F (36.3 C) (Oral)   Ht 5\' 6"  (1.676 m)   Wt 170 lb (77.1 kg)   BMI 27.44 kg/m  General:   Alert and oriented. Pleasant and cooperative. Well-nourished and well-developed.  Eyes:  Without icterus, sclera clear and conjunctiva pink.  Ears:  Normal auditory acuity. Cardiovascular:  S1, S2 present without murmurs appreciated. Extremities without clubbing or edema. Respiratory:  Clear to auscultation bilaterally. No wheezes, rales, or rhonchi. No distress.  Gastrointestinal:  +BS, soft, non-tender and non-distended. No HSM noted. No guarding or rebound. No masses appreciated.  Rectal:  Deferred  Musculoskalatal:  Symmetrical without gross deformities. Neurologic:  Alert and oriented x4;  grossly normal neurologically. Psych:  Alert and cooperative. Normal mood and affect. Heme/Lymph/Immune: No excessive bruising noted.    07/12/2018 10:27 AM   Disclaimer: This note was dictated with voice recognition software. Similar sounding words can  inadvertently be transcribed and may not be corrected upon review.

## 2018-07-12 NOTE — Assessment & Plan Note (Signed)
Personal history of colon polyps.  Previous colonoscopy in 2011 without polyps and recommended 5 to 10-year repeat exam.  She is currently due.  She was referred by primary care to have this scheduled.  I discussed that "elective" procedures are not being scheduled in the very near future.  She verbalized understanding and appreciation for this concept.  We will get her scheduled and evaluated as we are able to.  Return for follow-up based on post procedure recommendations.  Proceed with colonoscopy with Dr. Oneida Alar in the near future. The risks, benefits, and alternatives have been discussed in detail with the patient. They state understanding and desire to proceed.   Patient is not on any anticoagulants, anxiolytics, chronic pain medications, or antidepressants.  Conscious sedation should be adequate for her procedure.

## 2018-07-12 NOTE — Patient Instructions (Signed)
Your health issues we discussed today were:   Personal history of colon polyps: 1. Based on your history and your previous colonoscopy you are currently due for repeat colonoscopy 2. We will get to schedule when you can, in light of the current pandemic issues 3. Further recommendations will be made after your colonoscopy  Overall I recommend:  1. Return for follow-up based on recommendations made after colonoscopy 2. Call us if you have any questions or concerns.    Because of recent events of COVID-19 ("Coronavirus"), follow CDC recommendations:  1. Wash your hand frequently 2. Avoid touching your face 3. Stay away from people who are sick 4. If you have symptoms such as fever, cough, shortness of breath then call your healthcare provider for further guidance 5. If you are sick, STAY AT HOME unless otherwise directed by your healthcare provider. 6. Follow directions from state and national officials regarding staying safe    At [facility] we value your feedback. You may receive a survey about your visit today. Please share your experience as we strive to create trusting relationships with our patients to provide genuine, compassionate, quality care.  We appreciate your understanding and patience as we review any laboratory studies, imaging, and other diagnostic tests that are ordered as we care for you. Our office policy is 5 business days for review of these results, and any emergent or urgent results are addressed in a timely manner for your best interest. If you do not hear from our office in 1 week, please contact us.   We also encourage the use of MyChart, which contains your medical information for your review as well. If you are not enrolled in this feature, an access code is on this after visit summary for your convenience. Thank you for allowing [place] to be involved in your care.  It was great to see you today!  I hope you have a great day!!

## 2018-07-12 NOTE — Progress Notes (Signed)
cc'ed to pcp °

## 2018-07-18 ENCOUNTER — Other Ambulatory Visit: Payer: Self-pay | Admitting: Family Medicine

## 2018-08-06 ENCOUNTER — Other Ambulatory Visit: Payer: Self-pay | Admitting: Family Medicine

## 2018-08-17 ENCOUNTER — Other Ambulatory Visit: Payer: Self-pay

## 2018-08-17 ENCOUNTER — Ambulatory Visit (INDEPENDENT_AMBULATORY_CARE_PROVIDER_SITE_OTHER): Payer: Medicare Other | Admitting: Family Medicine

## 2018-08-17 ENCOUNTER — Encounter: Payer: Self-pay | Admitting: Family Medicine

## 2018-08-17 DIAGNOSIS — Z0000 Encounter for general adult medical examination without abnormal findings: Secondary | ICD-10-CM

## 2018-08-17 NOTE — Patient Instructions (Addendum)
Adrienne [name] , Thank you for taking time to come for your Medicare Wellness Visit. I appreciate your ongoing commitment to your health goals. Please review the following plan we discussed and let me know if I can assist you in the future.   Screening recommendations/referrals: Colonoscopy: Due 09/2018 (or when possible) Mammogram: Due 03/2019 Bone Density: Complete Recommended yearly ophthalmology/optometry visit for glaucoma screening and checkup Recommended yearly dental visit for hygiene and checkup  Vaccinations: Influenza vaccine: Due Fall 2020   Next appointment: Dec with Dr Moshe Cipro for annual exam   Preventive Care 4 Years and Older, Female Preventive care refers to lifestyle choices and visits with your health care provider that can promote health and wellness. What does preventive care include?  A yearly physical exam. This is also called an annual well check.  Dental exams once or twice a year.  Routine eye exams. Ask your health care provider how often you should have your eyes checked.  Personal lifestyle choices, including:  Daily care of your teeth and gums.  Regular physical activity.  Eating a healthy diet.  Avoiding tobacco and drug use.  Limiting alcohol use.  Practicing safe sex.  Taking low-dose aspirin every day.  Taking vitamin and mineral supplements as recommended by your health care provider. What happens during an annual well check? The services and screenings done by your health care provider during your annual well check will depend on your age, overall health, lifestyle risk factors, and family history of disease. Counseling  Your health care provider may ask you questions about your:  Alcohol use.  Tobacco use.  Drug use.  Emotional well-being.  Home and relationship well-being.  Sexual activity.  Eating habits.  History of falls.  Memory and ability to understand (cognition).  Work and work Statistician.  Reproductive  health. Screening  You may have the following tests or measurements:  Height, weight, and BMI.  Blood pressure.  Lipid and cholesterol levels. These may be checked every 5 years, or more frequently if you are over 18 years old.  Skin check.  Lung cancer screening. You may have this screening every year starting at age 14 if you have a 30-pack-year history of smoking and currently smoke or have quit within the past 15 years.  Fecal occult blood test (FOBT) of the stool. You may have this test every year starting at age 67.  Flexible sigmoidoscopy or colonoscopy. You may have a sigmoidoscopy every 5 years or a colonoscopy every 10 years starting at age 29.  Hepatitis C blood test.  Hepatitis B blood test.  Sexually transmitted disease (STD) testing.  Diabetes screening. This is done by checking your blood sugar (glucose) after you have not eaten for a while (fasting). You may have this done every 1-3 years.  Bone density scan. This is done to screen for osteoporosis. You may have this done starting at age 48.  Mammogram. This may be done every 1-2 years. Talk to your health care provider about how often you should have regular mammograms. Talk with your health care provider about your test results, treatment options, and if necessary, the need for more tests. Vaccines  Your health care provider may recommend certain vaccines, such as:  Influenza vaccine. This is recommended every year.  Tetanus, diphtheria, and acellular pertussis (Tdap, Td) vaccine. You may need a Td booster every 10 years.  Zoster vaccine. You may need this after age 38.  Pneumococcal 13-valent conjugate (PCV13) vaccine. One dose is recommended after age  65.  Pneumococcal polysaccharide (PPSV23) vaccine. One dose is recommended after age 36. Talk to your health care provider about which screenings and vaccines you need and how often you need them. This information is not intended to replace advice given to  you by your health care provider. Make sure you discuss any questions you have with your health care provider. Document Released: 05/09/2015 Document Revised: 12/31/2015 Document Reviewed: 02/11/2015 Elsevier Interactive Patient Education  2017 [place] Prevention in the Home Falls can cause injuries. They can happen to people of all ages. There are many things you can do to make your home safe and to help prevent falls. What can I do on the outside of my home?  Regularly fix the edges of walkways and driveways and fix any cracks.  Remove anything that might make you trip as you walk through a door, such as a raised step or threshold.  Trim any bushes or trees on the path to your home.  Use bright outdoor lighting.  Clear any walking paths of anything that might make someone trip, such as rocks or tools.  Regularly check to see if handrails are loose or broken. Make sure that both sides of any steps have handrails.  Any raised decks and porches should have guardrails on the edges.  Have any leaves, snow, or ice cleared regularly.  Use sand or salt on walking paths during winter.  Clean up any spills in your garage right away. This includes oil or grease spills. What can I do in the bathroom?  Use night lights.  Install grab bars by the toilet and in the tub and shower. Do not use towel bars as grab bars.  Use non-skid mats or decals in the tub or shower.  If you need to sit down in the shower, use a plastic, non-slip stool.  Keep the floor dry. Clean up any water that spills on the floor as soon as it happens.  Remove soap buildup in the tub or shower regularly.  Attach bath mats securely with double-sided non-slip rug tape.  Do not have throw rugs and other things on the floor that can make you trip. What can I do in the bedroom?  Use night lights.  Make sure that you have a light by your bed that is easy to reach.  Do not use any sheets or blankets that  are too big for your bed. They should not hang down onto the floor.  Have a firm chair that has side arms. You can use this for support while you get dressed.  Do not have throw rugs and other things on the floor that can make you trip. What can I do in the kitchen?  Clean up any spills right away.  Avoid walking on wet floors.  Keep items that you use a lot in easy-to-reach places.  If you need to reach something above you, use a strong step stool that has a grab bar.  Keep electrical cords out of the way.  Do not use floor polish or wax that makes floors slippery. If you must use wax, use non-skid floor wax.  Do not have throw rugs and other things on the floor that can make you trip. What can I do with my stairs?  Do not leave any items on the stairs.  Make sure that there are handrails on both sides of the stairs and use them. Fix handrails that are broken or loose. Make sure that handrails  are as long as the stairways.  Check any carpeting to make sure that it is firmly attached to the stairs. Fix any carpet that is loose or worn.  Avoid having throw rugs at the top or bottom of the stairs. If you do have throw rugs, attach them to the floor with carpet tape.  Make sure that you have a light switch at the top of the stairs and the bottom of the stairs. If you do not have them, ask someone to add them for you. What else can I do to help prevent falls?  Wear shoes that:  Do not have high heels.  Have rubber bottoms.  Are comfortable and fit you well.  Are closed at the toe. Do not wear sandals.  If you use a stepladder:  Make sure that it is fully opened. Do not climb a closed stepladder.  Make sure that both sides of the stepladder are locked into place.  Ask someone to hold it for you, if possible.  Clearly mark and make sure that you can see:  Any grab bars or handrails.  First and last steps.  Where the edge of each step is.  Use tools that help you  move around (mobility aids) if they are needed. These include:  Canes.  Walkers.  Scooters.  Crutches.  Turn on the lights when you go into a dark area. Replace any light bulbs as soon as they burn out.  Set up your furniture so you have a clear path. Avoid moving your furniture around.  If any of your floors are uneven, fix them.  If there are any pets around you, be aware of where they are.  Review your medicines with your doctor. Some medicines can make you feel dizzy. This can increase your chance of falling. Ask your doctor what other things that you can do to help prevent falls. This information is not intended to replace advice given to you by your health care provider. Make sure you discuss any questions you have with your health care provider. Document Released: 02/06/2009 Document Revised: 09/18/2015 Document Reviewed: 05/17/2014 Elsevier Interactive Patient Education  2017 [employer].

## 2018-08-17 NOTE — Progress Notes (Signed)
Subjective:   Adrienne [name] is a 73 y.o. female who presents for Medicare Annual (Subsequent) preventive examination.  Location of Patient: Home Location of Provider: Telehealth Consent was obtain for visit to be over via telehealth. A video enabled telemedicine application was used and I verified that I am speaking with the correct person using two identifiers.   Review of Systems:   Cardiac Risk Factors include: advanced age (>88men, >88 women)     Objective:     Vitals: There were no vitals taken for this visit.  There is no height or weight on file to calculate BMI.  Advanced Directives 08/15/2017 07/28/2016  Does Patient Have a Medical Advance Directive? No No  Would patient like information on creating a medical advance directive? No - Patient declined Yes (MAU/Ambulatory/Procedural Areas - Information given)    Tobacco Social History   Tobacco Use  Smoking Status Never Smoker  Smokeless Tobacco Never Used     Counseling given: Not Answered   Clinical Intake:  Pre-visit preparation completed: Yes  Pain : 0-10 Pain Score: 5  Pain Type: Chronic pain Pain Location: Hip Pain Orientation: Right Pain Descriptors / Indicators: Discomfort, Nagging Pain Onset: More than a month ago     Diabetes: No  How often do you need to have someone help you when you read instructions, pamphlets, or other written materials from your doctor or pharmacy?: 1 - Never What is the last grade level you completed in school?: college  Interpreter Needed?: No     Past Medical History:  Diagnosis Date  . Allergy   . Diverticulosis 05/2009  . Hyperlipidemia   . Hypertension    Past Surgical History:  Procedure Laterality Date  . ABDOMINAL HYSTERECTOMY  1990   for fibroids ovaries remain   . CHOLECYSTECTOMY  1980  . COLONOSCOPY  05/2009   rare diverticula, small internal hemorrhoids. Next colonoscopy in 5-10 yrs.  . left nipple inversion bx benign  2000   Family History   Problem Relation Age of Onset  . Kidney failure Father   . Arthritis Father   . Heart disease Sister   . Hypertension Sister   . Arthritis Sister   . Breast cancer Sister   . Diabetes Sister   . Arthritis Sister   . Lung cancer Brother        was a smoker  . Arthritis Brother   . Arthritis Mother   . Prostate cancer Brother   . Colon cancer Neg Hx    Social History   Socioeconomic History  . Marital status: Single    Spouse name: Not on file  . Number of children: 0  . Years of education: Not on file  . Highest education level: Not on file  Occupational History  . Occupation: retired    Fish farm manager: RETIRED  Social Needs  . Financial resource strain: Not hard at all  . Food insecurity:    Worry: Never true    Inability: Never true  . Transportation needs:    Medical: No    Non-medical: No  Tobacco Use  . Smoking status: Never Smoker  . Smokeless tobacco: Never Used  Substance and Sexual Activity  . Alcohol use: Yes    Comment: rarely   . Drug use: No  . Sexual activity: Not Currently    Birth control/protection: Surgical    Comment: not asked  Lifestyle  . Physical activity:    Days per week: 3 days    Minutes per session:  60 min  . Stress: Not at all  Relationships  . Social connections:    Talks on phone: More than three times a week    Gets together: Once a week    Attends religious service: More than 4 times per year    Active member of club or organization: Yes    Attends meetings of clubs or organizations: More than 4 times per year    Relationship status: Never married  Other Topics Concern  . Not on file  Social History Narrative  . Not on file    Outpatient Encounter Medications as of 08/17/2018  Medication Sig  . amLODipine (NORVASC) 2.5 MG tablet Take 1 tablet by mouth once daily  . aspirin EC 81 MG tablet Take 1 tablet (81 mg total) by mouth daily.  Marland Kitchen BIOTIN 5000 PO Take 2 capsules by mouth daily.   Marland Kitchen CALCIUM-MAGNESUIUM-ZINC 333-133-8.3 MG  TABS Take 3 tablets by mouth daily.   . Cetirizine HCl 10 MG TBDP Take 1 tablet by mouth daily as needed.   Maudry Mayhew, Angelica sinensis, (DONG QUAI PO) Take by mouth daily.  . Multiple Vitamin (MULTIVITAMIN) tablet Take 1/2 by mouth in the AM and half at night  . naproxen sodium (ANAPROX) 220 MG tablet Take 220 mg by mouth 2 (two) times daily with a meal.  . polyethylene glycol-electrolytes (TRILYTE) 420 g solution Take 4,000 mLs by mouth as directed.  . triamterene-hydrochlorothiazide (MAXZIDE-25) 37.5-25 MG tablet Take 1 tablet by mouth once daily  . Turmeric 500 MG TABS Take 1 tablet by mouth daily.   No facility-administered encounter medications on file as of 08/17/2018.     Activities of Daily Living In your present state of health, do you have any difficulty performing the following activities: 08/17/2018  Hearing? N  Vision? N  Difficulty concentrating or making decisions? N  Walking or climbing stairs? N  Dressing or bathing? N  Doing errands, shopping? N  Preparing Food and eating ? N  Using the Toilet? N  In the past six months, have you accidently leaked urine? N  Do you have problems with loss of bowel control? N  Managing your Medications? N  Managing your Finances? N  Housekeeping or managing your Housekeeping? N  Some recent data might be hidden    Patient Care Team: Fayrene Helper, MD as PCP - General    Assessment:   This is a routine wellness examination for Adrienne [name].  Exercise Activities and Dietary recommendations Current Exercise Habits: The patient does not participate in regular exercise at present, Exercise limited by: Other - see comments(pandemic)  Goals    . Exercise 3x per week (30 min per time)     Patient would like to start a more routine exercise program and start using an eliptical machine       Fall Risk Fall Risk  08/17/2018 03/28/2018 08/15/2017 07/28/2016 07/24/2015  Falls in the past year? 0 0 No No No  Number falls in past yr: -  - - - -  Injury with Fall? 0 - - - -  Risk Factor Category  - - - - -   Is the patient's home free of loose throw rugs in walkways, pet beds, electrical cords, etc?   yes      Grab bars in the bathroom? yes      Handrails on the stairs?   yes      Adequate lighting?   yes  Timed Get Up and Go  performed: unable to assess due to telemed visit nature.  Depression Screen PHQ 2/9 Scores 08/17/2018 03/28/2018 08/15/2017 07/28/2016  PHQ - 2 Score 0 0 0 0     Cognitive Function     6CIT Screen 08/17/2018 08/15/2017 07/28/2016  What Year? 0 points 0 points 0 points  What month? 0 points 0 points 0 points  What time? 0 points 0 points 0 points  Count back from 20 0 points 0 points 0 points  Months in reverse 0 points 0 points 0 points  Repeat phrase 0 points 0 points 0 points  Total Score 0 0 0    Immunization History  Administered Date(s) Administered  . Influenza Split 02/04/2011  . Influenza Whole 04/03/2009, 01/01/2010  . Influenza, High Dose Seasonal PF 03/28/2018  . Influenza,inj,Quad PF,6+ Mos 03/20/2013, 01/24/2014, 02/24/2015, 02/03/2016, 03/23/2017  . Pneumococcal Conjugate-13 07/03/2014  . Pneumococcal Polysaccharide-23 03/20/2013  . Td 04/03/2009  . Zoster 02/04/2011    Qualifies for Shingles Vaccine? Completed  Screening Tests Health Maintenance  Topic Date Due  . COLONOSCOPY  06/05/2014  . INFLUENZA VACCINE  11/25/2018  . TETANUS/TDAP  04/04/2019  . MAMMOGRAM  03/31/2020  . DEXA SCAN  Completed  . Hepatitis C Screening  Completed  . PNA vac Low Risk Adult  Completed    Cancer Screenings: Lung: Low Dose CT Chest recommended if Age 89-80 years, 30 pack-year currently smoking OR have quit w/in 15years. Patient does not qualify. Breast:  Up to date on Mammogram? Yes   Up to date of Bone Density/Dexa? Yes Colorectal: Due June 2020  Additional Screenings:  Hepatitis C Screening: Completed      Plan:  1. Encounter for Medicare annual wellness exam  I have  personally reviewed and noted the following in the patient's chart:   . Medical and social history . Use of alcohol, tobacco or illicit drugs  . Current medications and supplements . Functional ability and status . Nutritional status . Physical activity . Advanced directives . List of other physicians . Hospitalizations, surgeries, and ER visits in previous 12 months . Vitals . Screenings to include cognitive, depression, and falls . Referrals and appointments  In addition, I have reviewed and discussed with patient certain preventive protocols, quality metrics, and best practice recommendations. A written personalized care plan for preventive services as well as general preventive health recommendations were provided to patient.    I provided 20 minutes of non-face-to-face time during this encounter.    Perlie Mayo, NP  08/17/2018

## 2018-08-28 ENCOUNTER — Ambulatory Visit: Payer: Medicare Other

## 2018-10-02 ENCOUNTER — Telehealth: Payer: Self-pay | Admitting: *Deleted

## 2018-10-02 NOTE — Telephone Encounter (Signed)
Called pt and scheduled COVID 19 screening for 10/09/2018.  Pt is aware to remain in quarantine once testing is done.  Pt voiced understanding.

## 2018-10-03 ENCOUNTER — Telehealth: Payer: Self-pay | Admitting: Gastroenterology

## 2018-10-03 NOTE — Telephone Encounter (Signed)
Spoke with patient and she is rescheduled to 6/29 at 8:30am. Patient also will need to reschedule her COVID-19 testing. Patient aware will mail new instructions to her (confirmed address). [facility] for endo making aware of appt change. Fowarding to Angie to r/s COVID-19 testing

## 2018-10-03 NOTE — Telephone Encounter (Signed)
Pt is wanting to reschedule her colonoscopy with SF on 6/18. Please call [phone]

## 2018-10-03 NOTE — Telephone Encounter (Signed)
Called pt back and re-scheduled her COVID 19 screening to 10/19/2018.  Pt is aware of new date and was reminded to stay in quarantine once testing is done.

## 2018-10-09 ENCOUNTER — Other Ambulatory Visit (HOSPITAL_COMMUNITY): Payer: Medicare Other

## 2018-10-16 ENCOUNTER — Telehealth: Payer: Self-pay | Admitting: *Deleted

## 2018-10-16 NOTE — Telephone Encounter (Signed)
Called pt to remind her of her COVID testing on 10/19/2018.  Pt was also reminded to remain in quarantine once testing is done.  Pt voiced understanding.

## 2018-10-19 ENCOUNTER — Other Ambulatory Visit (HOSPITAL_COMMUNITY)
Admission: RE | Admit: 2018-10-19 | Discharge: 2018-10-19 | Disposition: A | Discharge status: Home / Self Care | Payer: Medicare Other | Source: Ambulatory Visit | Attending: Gastroenterology | Admitting: Gastroenterology

## 2018-10-19 ENCOUNTER — Other Ambulatory Visit: Payer: Self-pay

## 2018-10-19 DIAGNOSIS — Z1159 Encounter for screening for other viral diseases: Secondary | ICD-10-CM | POA: Insufficient documentation

## 2018-10-20 LAB — SARS CORONAVIRUS 2 BY RT PCR (HOSPITAL ORDER, PERFORMED IN ~~LOC~~ HOSPITAL LAB): SARS Coronavirus 2: NEGATIVE

## 2018-10-23 ENCOUNTER — Encounter (HOSPITAL_COMMUNITY): Payer: Self-pay

## 2018-10-23 ENCOUNTER — Other Ambulatory Visit: Payer: Self-pay

## 2018-10-23 ENCOUNTER — Ambulatory Visit (HOSPITAL_COMMUNITY)
Admission: RE | Admit: 2018-10-23 | Discharge: 2018-10-23 | Disposition: A | Discharge status: Home / Self Care | Payer: Medicare Other | Attending: Gastroenterology | Admitting: Gastroenterology

## 2018-10-23 ENCOUNTER — Encounter (HOSPITAL_COMMUNITY)
Admission: RE | Disposition: A | Discharge status: Home / Self Care | Payer: Self-pay | Source: Home / Self Care | Attending: Gastroenterology

## 2018-10-23 DIAGNOSIS — Z833 Family history of diabetes mellitus: Secondary | ICD-10-CM | POA: Insufficient documentation

## 2018-10-23 DIAGNOSIS — Q438 Other specified congenital malformations of intestine: Secondary | ICD-10-CM | POA: Insufficient documentation

## 2018-10-23 DIAGNOSIS — Z888 Allergy status to other drugs, medicaments and biological substances status: Secondary | ICD-10-CM | POA: Diagnosis not present

## 2018-10-23 DIAGNOSIS — Z841 Family history of disorders of kidney and ureter: Secondary | ICD-10-CM | POA: Insufficient documentation

## 2018-10-23 DIAGNOSIS — Z8042 Family history of malignant neoplasm of prostate: Secondary | ICD-10-CM | POA: Insufficient documentation

## 2018-10-23 DIAGNOSIS — K635 Polyp of colon: Secondary | ICD-10-CM | POA: Diagnosis not present

## 2018-10-23 DIAGNOSIS — Z9049 Acquired absence of other specified parts of digestive tract: Secondary | ICD-10-CM | POA: Insufficient documentation

## 2018-10-23 DIAGNOSIS — Z8249 Family history of ischemic heart disease and other diseases of the circulatory system: Secondary | ICD-10-CM | POA: Insufficient documentation

## 2018-10-23 DIAGNOSIS — K648 Other hemorrhoids: Secondary | ICD-10-CM | POA: Insufficient documentation

## 2018-10-23 DIAGNOSIS — Z7982 Long term (current) use of aspirin: Secondary | ICD-10-CM | POA: Diagnosis not present

## 2018-10-23 DIAGNOSIS — K5730 Diverticulosis of large intestine without perforation or abscess without bleeding: Secondary | ICD-10-CM | POA: Diagnosis not present

## 2018-10-23 DIAGNOSIS — K644 Residual hemorrhoidal skin tags: Secondary | ICD-10-CM | POA: Diagnosis not present

## 2018-10-23 DIAGNOSIS — I10 Essential (primary) hypertension: Secondary | ICD-10-CM | POA: Diagnosis not present

## 2018-10-23 DIAGNOSIS — E785 Hyperlipidemia, unspecified: Secondary | ICD-10-CM | POA: Diagnosis not present

## 2018-10-23 DIAGNOSIS — Z1211 Encounter for screening for malignant neoplasm of colon: Secondary | ICD-10-CM | POA: Insufficient documentation

## 2018-10-23 DIAGNOSIS — Z803 Family history of malignant neoplasm of breast: Secondary | ICD-10-CM | POA: Diagnosis not present

## 2018-10-23 DIAGNOSIS — Z801 Family history of malignant neoplasm of trachea, bronchus and lung: Secondary | ICD-10-CM | POA: Insufficient documentation

## 2018-10-23 DIAGNOSIS — Z8261 Family history of arthritis: Secondary | ICD-10-CM | POA: Insufficient documentation

## 2018-10-23 DIAGNOSIS — Z90710 Acquired absence of both cervix and uterus: Secondary | ICD-10-CM | POA: Diagnosis not present

## 2018-10-23 DIAGNOSIS — Z860100 Personal history of colon polyps, unspecified: Secondary | ICD-10-CM

## 2018-10-23 DIAGNOSIS — D123 Benign neoplasm of transverse colon: Secondary | ICD-10-CM | POA: Diagnosis not present

## 2018-10-23 HISTORY — PX: COLONOSCOPY: SHX5424

## 2018-10-23 HISTORY — PX: POLYPECTOMY: SHX5525

## 2018-10-23 SURGERY — COLONOSCOPY
Anesthesia: Moderate Sedation

## 2018-10-23 MED ORDER — STERILE WATER FOR IRRIGATION IR SOLN
Status: DC | PRN
  Administered 2018-10-23: 2.5 mL

## 2018-10-23 MED ORDER — MEPERIDINE HCL 100 MG/ML IJ SOLN
INTRAMUSCULAR | Status: DC | PRN
  Administered 2018-10-23 (×2): 25 mg via INTRAVENOUS

## 2018-10-23 MED ORDER — MIDAZOLAM HCL 5 MG/5ML IJ SOLN
INTRAMUSCULAR | Status: AC
  Filled 2018-10-23: qty 10

## 2018-10-23 MED ORDER — MEPERIDINE HCL 100 MG/ML IJ SOLN
INTRAMUSCULAR | Status: AC
  Filled 2018-10-23: qty 2

## 2018-10-23 MED ORDER — SODIUM CHLORIDE 0.9 % IV SOLN
INTRAVENOUS | Status: DC
  Administered 2018-10-23: 08:00:00 via INTRAVENOUS

## 2018-10-23 MED ORDER — MIDAZOLAM HCL 5 MG/5ML IJ SOLN
INTRAMUSCULAR | Status: DC | PRN
  Administered 2018-10-23: 1 mg via INTRAVENOUS
  Administered 2018-10-23 (×2): 2 mg via INTRAVENOUS

## 2018-10-23 NOTE — Progress Notes (Signed)
CC'D TO PCP °

## 2018-10-23 NOTE — H&P (Signed)
Primary Care Physician:  Fayrene Helper, MD Primary Gastroenterologist:  Dr. Oneida Alar  Pre-Procedure History & Physical: HPI:  Adrienne [name] is a 73 y.o. female here for  PERSONAL HISTORY OF POLYPS.  Past Medical History:  Diagnosis Date  . Allergy   . Diverticulosis 05/2009  . Hyperlipidemia   . Hypertension     Past Surgical History:  Procedure Laterality Date  . ABDOMINAL HYSTERECTOMY  1990   for fibroids ovaries remain   . CHOLECYSTECTOMY  1980  . COLONOSCOPY  05/2009   rare diverticula, small internal hemorrhoids. Next colonoscopy in 5-10 yrs.  . left nipple inversion bx benign  2000    Prior to Admission medications   Medication Sig Start Date End Date Taking? Authorizing Provider  amLODipine (NORVASC) 2.5 MG tablet Take 1 tablet by mouth once daily Patient taking differently: Take 2.5 mg by mouth every evening.  08/08/18  Yes Fayrene Helper, MD  aspirin EC 81 MG tablet Take 81 mg by mouth every evening.  09/12/12  Yes Fayrene Helper, MD  Biotin 10 MG CAPS Take 10 mg by mouth every evening.   Yes [provider]  Maudry Mayhew, Angelica sinensis, (DONG QUAI PO) Take 1 tablet by mouth daily.    Yes [provider]  loratadine (CLARITIN) 10 MG tablet Take 10 mg by mouth daily as needed for allergies.   Yes [provider]  Menthol-Methyl Salicylate (MUSCLE RUB) 10-15 % CREA Apply 1 application topically as needed for muscle pain.   Yes [provider]  Multiple Minerals-Vitamins (CALCIUM-MAGNESIUM-ZINC-D3) TABS Take 3 tablets by mouth every evening.   Yes [provider]  Multiple Vitamin (MULTIVITAMIN) tablet Take 1 tablet by mouth every evening. Take 1/2 by mouth in the AM and half at night   Yes [provider]  naproxen sodium (ANAPROX) 220 MG tablet Take 220-440 mg by mouth 2 (two) times daily with a meal.    Yes [provider]  polyethylene glycol-electrolytes (TRILYTE) 420 g solution Take 4,000 mLs  by mouth as directed. 07/12/18  Yes Wilson Sample, Marga Melnick, MD  triamterene-hydrochlorothiazide (MAXZIDE-25) 37.5-25 MG tablet Take 1 tablet by mouth once daily Patient taking differently: Take 1 tablet by mouth every evening.  07/19/18  Yes Fayrene Helper, MD  TURMERIC PO Take 450 mg by mouth every evening.   Yes [provider]    Allergies as of 07/12/2018 - Review Complete 07/12/2018  Allergen Reaction Noted  . Valsartan Shortness Of Breath 03/20/2013  . Ace inhibitors Cough 03/20/2013    Family History  Problem Relation Age of Onset  . Kidney failure Father   . Arthritis Father   . Heart disease Sister   . Hypertension Sister   . Arthritis Sister   . Breast cancer Sister   . Diabetes Sister   . Arthritis Sister   . Lung cancer Brother        was a smoker  . Arthritis Brother   . Arthritis Mother   . Prostate cancer Brother   . Colon cancer Neg Hx     Social History   Socioeconomic History  . Marital status: Single    Spouse name: Not on file  . Number of children: 0  . Years of education: Not on file  . Highest education level: Not on file  Occupational History  . Occupation: retired    Fish farm manager: RETIRED  Social Needs  . Financial resource strain: Not hard at all  . Food insecurity  Worry: Never true    Inability: Never true  . Transportation needs    Medical: No    Non-medical: No  Tobacco Use  . Smoking status: Never Smoker  . Smokeless tobacco: Never Used  Substance and Sexual Activity  . Alcohol use: Yes    Comment: rarely   . Drug use: No  . Sexual activity: Not Currently    Birth control/protection: Surgical    Comment: not asked  Lifestyle  . Physical activity    Days per week: 3 days    Minutes per session: 60 min  . Stress: Not at all  Relationships  . Social connections    Talks on phone: More than three times a week    Gets together: Once a week    Attends religious service: More than 4 times per year    Active member of club  or organization: Yes    Attends meetings of clubs or organizations: More than 4 times per year    Relationship status: Never married  . Intimate partner violence    Fear of current or ex partner: No    Emotionally abused: No    Physically abused: No    Forced sexual activity: No  Other Topics Concern  . Not on file  Social History Narrative  . Not on file    Review of Systems: See HPI, otherwise negative ROS   Physical Exam: BP (!) 142/86   Pulse 72   Temp 99 F (37.2 C) (Oral)   Resp 12   Ht 5\' 5"  (1.651 m)   Wt 74.4 kg   SpO2 100%   BMI 27.29 kg/m  General:   Alert,  pleasant and cooperative in NAD Head:  Normocephalic and atraumatic. Neck:  Supple; Lungs:  Clear throughout to auscultation.    Heart:  Regular rate and rhythm. Abdomen:  Soft, nontender and nondistended. Normal bowel sounds, without guarding, and without rebound.   Neurologic:  Alert and  oriented x4;  grossly normal neurologically.  Impression/Plan:     PERSONAL HISTORY OF POLYPS.  PLAN: 1. TCS TODAY. DISCUSSED PROCEDURE, BENEFITS, & RISKS: < 1% chance of medication reaction, bleeding, perforation, ASPIRATION, or rupture of spleen/liver requiring surgery to fix it and missed polyps < 1 cm 10-20% of the time.

## 2018-10-23 NOTE — Op Note (Signed)
[facility] Patient Name: Adrienne [name] Procedure Date: 10/23/2018 7:57 AM MRN: [id number] Date of Birth: Jul 01, 1945 Attending MD: Barney Drain MD, MD CSN: [id number] Age: 73 Admit Type: Outpatient Procedure:                Colonoscopy WITH COLD SNARE POLYPECTOMY Indications:              Personal history of colonic polyps Providers:                Barney Drain MD, MD, Gerome Sam, RN, Raphael Gibney, Technician Referring MD:             Norwood Levo. Simpson MD, MD Medicines:                Meperidine 50 mg IV, Midazolam 5 mg IV Complications:            No immediate complications. Estimated Blood Loss:     Estimated blood loss was minimal. Procedure:                Pre-Anesthesia Assessment:                           - Prior to the procedure, a History and Physical                            was performed, and patient medications and                            allergies were reviewed. The patient's tolerance of                            previous anesthesia was also reviewed. The risks                            and benefits of the procedure and the sedation                            options and risks were discussed with the patient.                            All questions were answered, and informed consent                            was obtained. Prior Anticoagulants: The patient has                            taken no previous anticoagulant or antiplatelet                            agents except for aspirin. ASA Grade Assessment: II                            - A patient with mild systemic disease. After  reviewing the risks and benefits, the patient was                            deemed in satisfactory condition to undergo the                            procedure. After obtaining informed consent, the                            colonoscope was passed under direct vision.                            Throughout the  procedure, the patient's blood                            pressure, pulse, and oxygen saturations were                            monitored continuously. The PCF-H190DL ([id number])                            was introduced through the anus and advanced to the                            the cecum, identified by appendiceal orifice and                            ileocecal valve. The colonoscopy was somewhat                            difficult due to a tortuous colon. Successful                            completion of the procedure was aided by increasing                            the dose of sedation medication, straightening and                            shortening the scope to obtain bowel loop reduction                            and COLOWRAP. The patient tolerated the procedure                            well. The quality of the bowel preparation was                            excellent. The ileocecal valve, appendiceal                            orifice, and rectum were photographed. Scope In: 8:57:33 AM Scope Out: 9:12:11 AM Scope Withdrawal Time: 0 hours 11 minutes 25 seconds  Total  Procedure Duration: 0 hours 14 minutes 38 seconds  Findings:      A few small and large-mouthed diverticula were found in the hepatic       flexure.      A 4 mm polyp was found in the proximal transverse colon. The polyp was       sessile. The polyp was removed with a cold snare. Resection and       retrieval were complete.      External and internal hemorrhoids were found.      The recto-sigmoid colon and sigmoid colon were moderately tortuous. Impression:               - Diverticulosis at the hepatic flexure.                           - One 4 mm polyp in the proximal transverse colon,                            removed with a cold snare. Resected and retrieved.                           - External and internal hemorrhoids.                           - Tortuous LEFT colon. Moderate Sedation:       Moderate (conscious) sedation was administered by the endoscopy nurse       and supervised by the endoscopist. The following parameters were       monitored: oxygen saturation, heart rate, blood pressure, and response       to care. Total physician intraservice time was 28 minutes. Recommendation:           - Patient has a contact number available for                            emergencies. The signs and symptoms of potential                            delayed complications were discussed with the                            patient. Return to normal activities tomorrow.                            Written discharge instructions were provided to the                            patient.                           - High fiber diet.                           - Continue present medications.                           - Await pathology results.                           -  Repeat colonoscopy in 5 years for surveillance. Procedure Code(s):        --- Professional ---                           [phone], Colonoscopy, flexible; with removal of                            tumor(s), polyp(s), or other lesion(s) by snare                            technique                           99153, Moderate sedation; each additional 15                            minutes intraservice time                           G0500, Moderate sedation services provided by the                            same physician or other qualified health care                            professional performing a gastrointestinal                            endoscopic service that sedation supports,                            requiring the presence of an independent trained                            observer to assist in the monitoring of the                            patient's level of consciousness and physiological                            status; initial 15 minutes of intra-service time;                            patient age 77 years or  older (additional time may                            be reported with [phone], as appropriate) Diagnosis Code(s):        --- Professional ---                           K64.8, Other hemorrhoids                           K63.5, Polyp of colon  Z86.010, Personal history of colonic polyps                           K57.30, Diverticulosis of large intestine without                            perforation or abscess without bleeding                           Q43.8, Other specified congenital malformations of                            intestine CPT copyright 2019 [employer]. All rights reserved. The codes documented in this report are preliminary and upon coder review may  be revised to meet current compliance requirements. Barney Drain, MD Barney Drain MD, MD 10/23/2018 9:23:12 AM This report has been signed electronically. Number of Addenda: 0

## 2018-10-24 NOTE — Discharge Instructions (Signed)
You have small internal hemorrhoids and diverticulosis IN YOUR RIGHT COLON. YOU HAD ONE SMALL POLYP REMOVED.    DRINK WATER TO KEEP YOUR URINE LIGHT YELLOW.  FOLLOW A HIGH FIBER DIET. AVOID ITEMS THAT CAUSE BLOATING. See info below.   USE PREPARATION H FOUR TIMES  A DAY IF NEEDED TO RELIEVE RECTAL PAIN/PRESSURE/BLEEDING.   YOUR BIOPSY RESULTS WILL BE BACK IN 5 BUSINESS DAYS.  Next colonoscopy in 5 years.  Colonoscopy Care After Read the instructions outlined below and refer to this sheet in the next week. These discharge instructions provide you with general information on caring for yourself after you leave the hospital. While your treatment has been planned according to the most current medical practices available, unavoidable complications occasionally occur. If you have any problems or questions after discharge, call DR. Ishmail Mcmanamon, [phone].  ACTIVITY  You may resume your regular activity, but move at a slower pace for the next 24 hours.   Take frequent rest periods for the next 24 hours.   Walking will help get rid of the air and reduce the bloated feeling in your belly (abdomen).   No driving for 24 hours (because of the medicine (anesthesia) used during the test).   You may shower.   Do not sign any important legal documents or operate any machinery for 24 hours (because of the anesthesia used during the test).    NUTRITION  Drink plenty of fluids.   You may resume your normal diet as instructed by your doctor.   Begin with a light meal and progress to your normal diet. Heavy or fried foods are harder to digest and may make you feel sick to your stomach (nauseated).   Avoid alcoholic beverages for 24 hours or as instructed.    MEDICATIONS  You may resume your normal medications.   WHAT YOU CAN EXPECT TODAY  Some feelings of bloating in the abdomen.   Passage of more gas than usual.   Spotting of blood in your stool or on the toilet paper  .  IF YOU  HAD POLYPS REMOVED DURING THE COLONOSCOPY:  Eat a soft diet IF YOU HAVE NAUSEA, BLOATING, ABDOMINAL PAIN, OR VOMITING.    FINDING OUT THE RESULTS OF YOUR TEST Not all test results are available during your visit. DR. Oneida Alar WILL CALL YOU WITHIN 14 DAYS OF YOUR PROCEDUE WITH YOUR RESULTS. Do not assume everything is normal if you have not heard from DR. Eyanna Mcgonagle, CALL HER OFFICE AT [phone].  SEEK IMMEDIATE MEDICAL ATTENTION AND CALL THE OFFICE: [phone] IF:  You have more than a spotting of blood in your stool.   Your belly is swollen (abdominal distention).   You are nauseated or vomiting.   You have a temperature over 101F.   You have abdominal pain or discomfort that is severe or gets worse throughout the day.  High-Fiber Diet A high-fiber diet changes your normal diet to include more whole grains, legumes, fruits, and vegetables. Changes in the diet involve replacing refined carbohydrates with unrefined foods. The calorie level of the diet is essentially unchanged. The Dietary Reference Intake (recommended amount) for adult males is 38 grams per day. For adult females, it is 25 grams per day. Pregnant and lactating women should consume 28 grams of fiber per day. Fiber is the intact part of a plant that is not broken down during digestion. Functional fiber is fiber that has been isolated from the plant to provide a beneficial effect in the body.  PURPOSE  Increase stool bulk.   Ease and regulate bowel movements.   Lower cholesterol.   REDUCE RISK OF COLON CANCER  INDICATIONS THAT YOU NEED MORE FIBER  Constipation and hemorrhoids.   Uncomplicated diverticulosis (intestine condition) and irritable bowel syndrome.   Weight management.   As a protective measure against hardening of the arteries (atherosclerosis), diabetes, and cancer.   GUIDELINES FOR INCREASING FIBER IN THE DIET  Start adding fiber to the diet slowly. A gradual increase of about 5 more grams (2  slices of whole-wheat bread, 2 servings of most fruits or vegetables, or 1 bowl of high-fiber cereal) per day is best. Too rapid an increase in fiber may result in constipation, flatulence, and bloating.   Drink enough water and fluids to keep your urine clear or pale yellow. Water, juice, or caffeine-free drinks are recommended. Not drinking enough fluid may cause constipation.   Eat a variety of high-fiber foods rather than one type of fiber.   Try to increase your intake of fiber through using high-fiber foods rather than fiber pills or supplements that contain small amounts of fiber.   The goal is to change the types of food eaten. Do not supplement your present diet with high-fiber foods, but replace foods in your present diet.   INCLUDE A VARIETY OF FIBER SOURCES  Replace refined and processed grains with whole grains, canned fruits with fresh fruits, and incorporate other fiber sources. White rice, white breads, and most bakery goods contain little or no fiber.   Brown whole-grain rice, buckwheat oats, and many fruits and vegetables are all good sources of fiber. These include: broccoli, Brussels sprouts, cabbage, cauliflower, beets, sweet potatoes, white potatoes (skin on), carrots, tomatoes, eggplant, squash, berries, fresh fruits, and dried fruits.   Cereals appear to be the richest source of fiber. Cereal fiber is found in whole grains and bran. Bran is the fiber-rich outer coat of cereal grain, which is largely removed in refining. In whole-grain cereals, the bran remains. In breakfast cereals, the largest amount of fiber is found in those with "bran" in their names. The fiber content is sometimes indicated on the label.   You may need to include additional fruits and vegetables each day.   In baking, for 1 cup white flour, you may use the following substitutions:   1 cup whole-wheat flour minus 2 tablespoons.   1/2 cup white flour plus 1/2 cup whole-wheat flour.   Polyps, Colon   A polyp is extra tissue that grows inside your body. Colon polyps grow in the large intestine. The large intestine, also called the colon, is part of your digestive system. It is a long, hollow tube at the end of your digestive tract where your body makes and stores stool. Most polyps are not dangerous. They are benign. This means they are not cancerous. But over time, some types of polyps can turn into cancer. Polyps that are smaller than a pea are usually not harmful. But larger polyps could someday become or may already be cancerous. To be safe, doctors remove all polyps and test them.   PREVENTION There is not one sure way to prevent polyps. You might be able to lower your risk of getting them if you:  Eat more fruits and vegetables and less fatty food.   Do not smoke.   Avoid alcohol.   Exercise every day.   Lose weight if you are overweight.   Eating more calcium and folate can also lower your risk of getting polyps.  Some foods that are rich in calcium are milk, cheese, and broccoli. Some foods that are rich in folate are chickpeas, kidney beans, and spinach.    Diverticulosis Diverticulosis is a common condition that develops when small pouches (diverticula) form in the wall of the colon. The risk of diverticulosis increases with age. It happens more often in people who eat a low-fiber diet. Most individuals with diverticulosis have no symptoms. Those individuals with symptoms usually experience belly (abdominal) pain, constipation, or loose stools (diarrhea).  HOME CARE INSTRUCTIONS  Increase the amount of fiber in your diet as directed by your caregiver or dietician. This may reduce symptoms of diverticulosis.   Drink at least 6 to 8 glasses of water each day to prevent constipation.   Try not to strain when you have a bowel movement.   Avoiding nuts and seeds to prevent complications is NOT NECESSARY.   FOODS HAVING HIGH FIBER CONTENT INCLUDE:  Fruits. Apple, peach, pear,  tangerine, raisins, prunes.   Vegetables. Brussels sprouts, asparagus, broccoli, cabbage, carrot, cauliflower, romaine lettuce, spinach, summer squash, tomato, winter squash, zucchini.   Starchy Vegetables. Baked beans, kidney beans, lima beans, split peas, lentils, potatoes (with skin).   Grains. Whole wheat bread, brown rice, bran flake cereal, plain oatmeal, white rice, shredded wheat, bran muffins.   SEEK IMMEDIATE MEDICAL CARE IF:  You develop increasing pain or severe bloating.   You have an oral temperature above 101F.   You develop vomiting or bowel movements that are bloody or black.   Hemorrhoids Hemorrhoids are dilated (enlarged) veins around the rectum. Sometimes clots will form in the veins. This makes them swollen and painful. These are called thrombosed hemorrhoids. Causes of hemorrhoids include:  Constipation.   Straining to have a bowel movement.   HEAVY LIFTING   HOME CARE INSTRUCTIONS  Eat a well balanced diet and drink 6 to 8 glasses of water every day to avoid constipation. You may also use a bulk laxative.   Avoid straining to have bowel movements.   Keep anal area dry and clean.   Do not use a donut shaped pillow or sit on the toilet for long periods. This increases blood pooling and pain.   Move your bowels when your body has the urge; this will require less straining and will decrease pain and pressure.

## 2018-10-25 NOTE — Progress Notes (Signed)
PT is aware.

## 2018-10-26 ENCOUNTER — Encounter (HOSPITAL_COMMUNITY): Payer: Self-pay | Admitting: Gastroenterology

## 2018-10-28 ENCOUNTER — Other Ambulatory Visit: Payer: Self-pay | Admitting: Family Medicine

## 2019-01-15 ENCOUNTER — Other Ambulatory Visit: Payer: Self-pay

## 2019-01-15 ENCOUNTER — Ambulatory Visit (INDEPENDENT_AMBULATORY_CARE_PROVIDER_SITE_OTHER): Payer: Medicare Other

## 2019-01-15 DIAGNOSIS — Z23 Encounter for immunization: Secondary | ICD-10-CM

## 2019-02-01 ENCOUNTER — Other Ambulatory Visit: Payer: Self-pay

## 2019-02-01 MED ORDER — TRIAMTERENE-HCTZ 37.5-25 MG PO TABS: 1.0000 | ORAL_TABLET | Freq: Every day | ORAL | 0 refills | Status: DC

## 2019-02-02 ENCOUNTER — Other Ambulatory Visit: Payer: Self-pay | Admitting: Family Medicine

## 2019-04-02 ENCOUNTER — Telehealth: Payer: Self-pay

## 2019-04-02 DIAGNOSIS — E785 Hyperlipidemia, unspecified: Secondary | ICD-10-CM

## 2019-04-02 DIAGNOSIS — E559 Vitamin D deficiency, unspecified: Secondary | ICD-10-CM

## 2019-04-02 DIAGNOSIS — I10 Essential (primary) hypertension: Secondary | ICD-10-CM

## 2019-04-02 NOTE — Telephone Encounter (Signed)
Labs ordered.

## 2019-04-02 NOTE — Telephone Encounter (Signed)
-----   Message from Fayrene Helper, MD sent at 04/02/2019  9:14 AM EST ----- Regarding: pls also order cBC, tSH and vit d

## 2019-04-03 ENCOUNTER — Ambulatory Visit (INDEPENDENT_AMBULATORY_CARE_PROVIDER_SITE_OTHER): Payer: Medicare Other | Admitting: Family Medicine

## 2019-04-03 ENCOUNTER — Encounter: Payer: Self-pay | Admitting: Family Medicine

## 2019-04-03 ENCOUNTER — Other Ambulatory Visit: Payer: Self-pay

## 2019-04-03 VITALS — BP 130/78 | HR 76 | Resp 15 | Ht 65.5 in | Wt 164.0 lb

## 2019-04-03 DIAGNOSIS — M25551 Pain in right hip: Secondary | ICD-10-CM

## 2019-04-03 DIAGNOSIS — M25561 Pain in right knee: Secondary | ICD-10-CM

## 2019-04-03 DIAGNOSIS — E663 Overweight: Secondary | ICD-10-CM | POA: Diagnosis not present

## 2019-04-03 DIAGNOSIS — Z83511 Family history of glaucoma: Secondary | ICD-10-CM

## 2019-04-03 DIAGNOSIS — E785 Hyperlipidemia, unspecified: Secondary | ICD-10-CM

## 2019-04-03 DIAGNOSIS — Z1231 Encounter for screening mammogram for malignant neoplasm of breast: Secondary | ICD-10-CM | POA: Diagnosis not present

## 2019-04-03 DIAGNOSIS — G8929 Other chronic pain: Secondary | ICD-10-CM

## 2019-04-03 DIAGNOSIS — H547 Unspecified visual loss: Secondary | ICD-10-CM

## 2019-04-03 DIAGNOSIS — I10 Essential (primary) hypertension: Secondary | ICD-10-CM

## 2019-04-03 DIAGNOSIS — S93402A Sprain of unspecified ligament of left ankle, initial encounter: Secondary | ICD-10-CM

## 2019-04-03 LAB — BASIC METABOLIC PANEL WITH GFR
BUN: 13 mg/dL (ref 7–25)
CO2: 32 mmol/L (ref 20–32)
Calcium: 10.4 mg/dL (ref 8.6–10.4)
Chloride: 101 mmol/L (ref 98–110)
Creat: 0.9 mg/dL (ref 0.60–0.93)
GFR, Est African American: 75 mL/min/{1.73_m2} (ref >=60)
GFR, Est Non African American: 64 mL/min/{1.73_m2} (ref >=60)
Glucose, Bld: 93 mg/dL (ref 65–99)
Potassium: 4 mmol/L (ref 3.5–5.3)
Sodium: 140 mmol/L (ref 135–146)

## 2019-04-03 LAB — CBC
HCT: 38.9 % (ref 35.0–45.0)
Hemoglobin: 13 g/dL (ref 11.7–15.5)
MCH: 29.3 pg (ref 27.0–33.0)
MCHC: 33.4 g/dL (ref 32.0–36.0)
MCV: 87.6 fL (ref 80.0–100.0)
MPV: 12 fL (ref 7.5–12.5)
Platelets: 309 10*3/uL (ref 140–400)
RBC: 4.44 10*6/uL (ref 3.80–5.10)
RDW: 12.9 % (ref 11.0–15.0)
WBC: 6.5 10*3/uL (ref 3.8–10.8)

## 2019-04-03 LAB — TSH: TSH: 1.9 mIU/L (ref 0.40–4.50)

## 2019-04-03 LAB — LIPID PANEL
Cholesterol: 254 mg/dL — ABNORMAL HIGH (ref <200)
HDL: 99 mg/dL (ref >=50)
LDL Cholesterol (Calc): 138 mg/dL (calc) — ABNORMAL HIGH
Non-HDL Cholesterol (Calc): 155 mg/dL (calc) — ABNORMAL HIGH (ref <130)
Total CHOL/HDL Ratio: 2.6 (calc) (ref <5.0)
Triglycerides: 75 mg/dL (ref <150)

## 2019-04-03 LAB — VITAMIN D 25 HYDROXY (VIT D DEFICIENCY, FRACTURES): Vit D, 25-Hydroxy: 37 ng/mL (ref 30–100)

## 2019-04-03 NOTE — Patient Instructions (Addendum)
F/U  In 12 month ibn office with labs, call if you need me sooner  Please schedule mammogram at checkout  Keep up great work Need to continue to lower cholesterol  You are referred for eye exam , Dr Valetta Close, f/h glaucoma   You are referreredto Dr Debroah Loop office re right hip and knee pain, and  Also left ankle pain s/p trauma 2 months ago  It is important that you exercise regularly at least 30 minutes 5 times a week. If you develop chest pain, have severe difficulty breathing, or feel very tired, stop exercising immediately and seek medical attention    Stop aspirin  Thanks for choosing [facility], we consider it a privelige to serve you.

## 2019-04-06 ENCOUNTER — Encounter: Payer: Self-pay | Admitting: Family Medicine

## 2019-04-06 DIAGNOSIS — S93402A Sprain of unspecified ligament of left ankle, initial encounter: Secondary | ICD-10-CM | POA: Insufficient documentation

## 2019-04-06 DIAGNOSIS — M25561 Pain in right knee: Secondary | ICD-10-CM | POA: Insufficient documentation

## 2019-04-06 NOTE — Assessment & Plan Note (Signed)
Needs annual eye exam, referral sent

## 2019-04-06 NOTE — Assessment & Plan Note (Signed)
Controlled, no change in medication DASH diet and commitment to daily physical activity for a minimum of 30 minutes discussed and encouraged, as a part of hypertension management. The importance of attaining a healthy weight is also discussed.  BP/Weight 04/03/2019 10/23/2018 07/12/2018 03/28/2018 08/15/2017 07/23/2017 [id number]  Systolic BP [id number] 123456 [id number] [id number]  Diastolic BP 78 68 80 84 76 85 80  Wt. (Lbs) 164.04 164 170 166 166.5 - 160  BMI 26.88 27.29 27.44 27.2 27.29 - 26.63

## 2019-04-06 NOTE — Assessment & Plan Note (Signed)
Increased stiffness , limiting mobility, some instability present as well

## 2019-04-06 NOTE — Assessment & Plan Note (Signed)
Stiffness and [pain worsening over time, Ortho to eval

## 2019-04-06 NOTE — Progress Notes (Signed)
Adrienne [name]     MRN: [id number]      DOB: 19-Oct-1945   HPI [name] is here for follow up and re-evaluation of chronic medical conditions, medication management and review of any available recent lab and radiology data.  Preventive health is updated, specifically  Cancer screening and Immunization.   Questions or concerns regarding consultations or procedures which the PT has had in the interim are  addressed. The PT denies any adverse reactions to current medications since the last visit.  C/o urge urinary incontinence, no medication indicated currently, but she wil work on behavioral change to improve this C/o worsenign arthritic knee pain, right worse than left Recent fall approximately 4 weeks ago,  Twisted left ankle, still notes left ankle swelling with mild discomfort  attimes ROS Denies recent fever or chills. Denies sinus pressure, nasal congestion, ear pain or sore throat. Denies chest congestion, productive cough or wheezing. Denies chest pains, palpitations and leg swelling Denies abdominal pain, nausea, vomiting,diarrhea or constipation.   Denies dysuria, frequency, hesitancy  Increased right knee  pain, swelling and limitation in mobility.Sometimes has instability, needs Ortho re eval, increased left ankle swelling post injury about 4 to 6 weeks go Denies headaches, seizures, numbness, or tingling. Denies depression, anxiety or insomnia. Denies skin break down or rash.   PE  BP 130/78   Pulse 76   Resp 15   Ht 5' 5.5" (1.664 m)   Wt 164 lb 0.6 oz (74.4 kg)   SpO2 98%   BMI 26.88 kg/m   Patient alert and oriented and in no cardiopulmonary distress.  HEENT: No facial asymmetry, EOMI,     Neck supple .  Chest: Clear to auscultation bilaterally.  CVS: S1, S2 no murmurs, no S3.Regular rate.  ABD: Soft non tender.   Ext: No edema  MS: Adequate ROM spine, shoulders, reduced in right  hip .localized Tenderness  and mild swelling of left ankle. Decreased ROM  right knee with deformity and crepitus and tenderness medially  Skin: Intact, no ulcerations or rash noted.  Psych: Good eye contact, normal affect. Memory intact not anxious or depressed appearing.  CNS: CN 2-12 intact, power,  normal throughout.no focal deficits noted.   Assessment & Plan  Essential hypertension Controlled, no change in medication DASH diet and commitment to daily physical activity for a minimum of 30 minutes discussed and encouraged, as a part of hypertension management. The importance of attaining a healthy weight is also discussed.  BP/Weight 04/03/2019 10/23/2018 07/12/2018 03/28/2018 08/15/2017 07/23/2017 [id number]  Systolic BP [id number] 123456 [id number] 123456 [id number] 123456  Diastolic BP 78 68 80 84 76 85 80  Wt. (Lbs) 164.04 164 170 166 166.5 - 160  BMI 26.88 27.29 27.44 27.2 27.29 - 26.63       Hyperlipidemia LDL goal <100 Hyperlipidemia:Low fat diet discussed and encouraged.   Lipid Panel  Lab Results  Component Value Date   CHOL 254 (H) 04/02/2019   HDL 99 04/02/2019   LDLCALC 138 (H) 04/02/2019   TRIG 75 04/02/2019   CHOLHDL 2.6 04/02/2019   Improved , needs to continue to reduce fat intake    Overweight (BMI 25.0-29.9)  Patient re-educated about  the importance of commitment to a  minimum of 150 minutes of exercise per week as able.  The importance of healthy food choices with portion control discussed, as well as eating regularly and within a 12 hour window most days. The need to choose "clean , green" food 50 to  75% of the time is discussed, as well as to make water the primary drink and set a goal of 64 ounces water daily.    Weight /BMI 04/03/2019 10/23/2018 07/12/2018  WEIGHT 164 lb 0.6 oz 164 lb 170 lb  HEIGHT 5' 5.5" 5\' 5"  5\' 6"   BMI 26.88 kg/m2 27.29 kg/m2 27.44 kg/m2      FH: glaucoma Needs annual eye exam, referral sent   Left ankle sprain Swelling and slight pain ersisit 6 weeks after fall which resulted in twisting of the ankle, needs  Ortho eval  Knee pain, right Increased stiffness , limiting mobility, some instability present as well  Hip pain, chronic, right Stiffness and [pain worsening over time, Ortho to eval

## 2019-04-06 NOTE — Assessment & Plan Note (Signed)
  Patient re-educated about  the importance of commitment to a  minimum of 150 minutes of exercise per week as able.  The importance of healthy food choices with portion control discussed, as well as eating regularly and within a 12 hour window most days. The need to choose "clean , green" food 50 to 75% of the time is discussed, as well as to make water the primary drink and set a goal of 64 ounces water daily.    Weight /BMI 04/03/2019 10/23/2018 07/12/2018  WEIGHT 164 lb 0.6 oz 164 lb 170 lb  HEIGHT 5' 5.5" 5\' 5"  5\' 6"   BMI 26.88 kg/m2 27.29 kg/m2 27.44 kg/m2

## 2019-04-06 NOTE — Assessment & Plan Note (Signed)
Swelling and slight pain ersisit 6 weeks after fall which resulted in twisting of the ankle, needs Ortho eval

## 2019-04-06 NOTE — Assessment & Plan Note (Signed)
Hyperlipidemia:Low fat diet discussed and encouraged.   Lipid Panel  Lab Results  Component Value Date   CHOL 254 (H) 04/02/2019   HDL 99 04/02/2019   LDLCALC 138 (H) 04/02/2019   TRIG 75 04/02/2019   CHOLHDL 2.6 04/02/2019   Improved , needs to continue to reduce fat intake

## 2019-04-11 ENCOUNTER — Encounter: Payer: Self-pay | Admitting: Family Medicine

## 2019-04-11 ENCOUNTER — Other Ambulatory Visit: Payer: Self-pay

## 2019-04-11 ENCOUNTER — Ambulatory Visit (HOSPITAL_COMMUNITY)
Admission: RE | Admit: 2019-04-11 | Discharge: 2019-04-11 | Disposition: A | Discharge status: Home / Self Care | Payer: Medicare Other | Source: Ambulatory Visit | Attending: Family Medicine | Admitting: Family Medicine

## 2019-04-11 DIAGNOSIS — Z1231 Encounter for screening mammogram for malignant neoplasm of breast: Secondary | ICD-10-CM | POA: Insufficient documentation

## 2019-05-06 ENCOUNTER — Other Ambulatory Visit: Payer: Self-pay | Admitting: Family Medicine

## 2019-05-08 DIAGNOSIS — M545 Low back pain: Secondary | ICD-10-CM | POA: Diagnosis not present

## 2019-05-13 ENCOUNTER — Other Ambulatory Visit: Payer: Self-pay | Admitting: Family Medicine

## 2019-05-14 ENCOUNTER — Encounter (HOSPITAL_COMMUNITY): Payer: Self-pay

## 2019-05-14 ENCOUNTER — Ambulatory Visit (HOSPITAL_COMMUNITY): Payer: Medicare Other | Attending: Orthopedic Surgery

## 2019-05-14 ENCOUNTER — Other Ambulatory Visit: Payer: Self-pay

## 2019-05-14 DIAGNOSIS — G8929 Other chronic pain: Secondary | ICD-10-CM | POA: Diagnosis not present

## 2019-05-14 DIAGNOSIS — R262 Difficulty in walking, not elsewhere classified: Secondary | ICD-10-CM | POA: Diagnosis not present

## 2019-05-14 DIAGNOSIS — M25551 Pain in right hip: Secondary | ICD-10-CM | POA: Diagnosis not present

## 2019-05-14 DIAGNOSIS — M25561 Pain in right knee: Secondary | ICD-10-CM | POA: Diagnosis not present

## 2019-05-14 DIAGNOSIS — M545 Low back pain, unspecified: Secondary | ICD-10-CM

## 2019-05-14 MED ORDER — TRIAMTERENE-HCTZ 37.5-25 MG PO TABS: 1.0000 | ORAL_TABLET | Freq: Every day | ORAL | 1 refills | Status: DC

## 2019-05-14 NOTE — Therapy (Signed)
[place], [place], [address] Phone: [phone]   Fax:  [phone]  Physical Therapy Evaluation  [name] MRN: [id number] Date of Birth: 10-23-1945 Referring Provider (PT): Earlie Server, MD   Encounter Date: 05/14/2019  PT End of Session - 05/14/19 1415    Visit Number  1    Number of Visits  12    Date for PT Re-Evaluation  06/25/19    Authorization Type  Primary: Medicare, Secondary: BCBS    Authorization - Visit Number  1    Authorization - Number of Visits  10    PT Start Time  2836    PT Stop Time  1400    PT Time Calculation (min)  45 min    Activity Tolerance  Patient tolerated treatment well    Behavior During Therapy  [facility] for tasks assessed/performed       Past Medical History:  Diagnosis Date  . Allergy   . Diverticulosis 05/2009  . Hyperlipidemia   . Hypertension     Past Surgical History:  Procedure Laterality Date  . ABDOMINAL HYSTERECTOMY  1990   for fibroids ovaries remain   . CHOLECYSTECTOMY  1980  . COLONOSCOPY  05/2009   rare diverticula, small internal hemorrhoids. Next colonoscopy in 5-10 yrs.  . COLONOSCOPY N/A 10/23/2018   Procedure: COLONOSCOPY;  Surgeon: Danie Binder, MD;  [facility];  Service: Endoscopy;  Laterality: N/A;  2:00pm  . left nipple inversion bx benign  2000  . POLYPECTOMY  10/23/2018   Procedure: POLYPECTOMY;  Surgeon: Danie Binder, MD;  [facility];  Service: Endoscopy;;    There were no vitals filed for this visit.   Subjective Assessment - 05/14/19 1315    Subjective  Pt reports 10-11 years building, but pain got really bad November 2019. Pt reports increased pain with walking. Pt reports R hip pain upon waking, doesn't wake her in the night. Pt reports pain as "dull" that fluctuates 10/10 to 6/10. Pt reports RLE buckling, LLE just beginning. Pt denies RW/SPC use. Pt denies numbness/tingling down  BLE, denies loss of b/b control. Pt reports step to pattern on stairs. Pt reports pain/difficulty with rising from sitting aftera  long duration. Pt reports increased LBP with washing dishes. Pt denies surgeries on joints or low back and denies steroid injections. Pt reports MD suggested oral medication for pain control with PT, if no relief then proceed with injections.    Limitations  Standing;Walking;House hold activities    How long can you sit comfortably?  no issues    How long can you stand comfortably?  10 minutes    How long can you walk comfortably?  20-30 minutes    Diagnostic tests  x-ray- contacting MD for results    Patient Stated Goals  help me to the point that I can walk a distance again    Currently in Pain?  No/denies   no pain sitting        [facility] PT Assessment - 05/14/19 0001      Assessment   Medical Diagnosis  R knee/hip pain, LBP    Referring Provider (PT)  Earlie Server, MD    Onset Date/Surgical Date  --   10-11 years, worsening since November 2020   Next MD Visit  none scheduled    Prior Therapy  yes, HEP for LBP      Precautions   Precautions  None      Restrictions   Weight Bearing Restrictions  No      Balance Screen   Has the patient fallen in the past 6 months  No    Has the patient had a decrease in activity level because of a fear of falling?   No    Is the patient reluctant to leave their home because of a fear of falling?   No      Prior Function   Level of Independence  Independent    Leisure  tai chi, personal training, line dancing all before [phone], since then nothing; volunteer at [employer]   Overall Cognitive Status  Within Functional Limits for tasks assessed      Observation/Other Assessments   Observations  minimal leg length difference ASIS to medial mal measurement, R leg 0.25 inches longer, possible R tibia longer than L    Scoliosis  increased lumbar lordosis in static standing    Focus on Therapeutic Outcomes  (FOTO)   50% limited      Sensation   Light Touch  Appears Intact      Functional Tests   Functional tests  Sit to Stand      Sit to Stand   Comments  5x STS: 17.6 sec, from chair, no UE assist, pain in R hip, bil genu valgus with weight-shift to LLE      ROM / Strength   AROM / PROM / Strength  AROM;Strength      AROM   Overall AROM Comments  LBP after returned to sitting and resting, not during mobility    AROM Assessment Site  Lumbar    Lumbar Flexion  hands to floor, pain R hip with return to stand    Lumbar Extension  50% limited, pain in R hip    Lumbar - Right Side Bend  jt line, pain in R hip    Lumbar - Left Side Bend  2 inches above jt line, pain in R hip    Lumbar - Right Rotation  50% limited, pain in R hip    Lumbar - Left Rotation  50% limited, pain in R hip      Strength   Strength Assessment Site  Hip;Knee;Ankle    Right Hip Flexion  3+/5    Right Hip Extension  3+/5    Right Hip ABduction  3+/5    Left Hip Flexion  4+/5    Left Hip Extension  3+/5    Left Hip ABduction  4+/5    Right Knee Flexion  4+/5    Right Knee Extension  5/5    Left Knee Flexion  4+/5    Left Knee Extension  5/5    Right Ankle Dorsiflexion  5/5    Left Ankle Dorsiflexion  5/5      Palpation   Spinal mobility  denies TTP with gentle PAs to lumbar vertebrae    SI assessment   TTP R SIJ with recreation of pain symptoms    Palpation comment  Gross R hip mm tenderness throughout posterior, lateral and into hip flexor      Special Tests    Special Tests  Sacrolliac Tests    Sacroiliac Tests   Pelvic Compression      Pelvic Compression   Findings  Positive    Side  Right      Sacral thrust    Findings  Positive    Side  Right  Gaenslen's test   Findings  Positive    Side   Right      Ambulation/Gait   Assistive device  None    Gait Pattern  Step-through pattern;Decreased weight shift to right;Antalgic    Gait Comments  mild limip noted with decreased weight-shifting  to RLE, no knee buckling noted      Balance   Balance Assessed  Yes      Static Standing Balance   Static Standing - Balance Support  No upper extremity supported    Static Standing Balance -  Activities   Single Leg Stance - Right Leg;Single Leg Stance - Left Leg    Static Standing - Comment/# of Minutes  R: 30+ sec, mild unsteadiness, L: 30+ sec, no unsteadiness         Objective measurements completed on examination: See above findings.        PT Education - 05/14/19 1415    Education Details  Assessment findings, FOTO findings, POC, initiated HEP    Person(s) Educated  Patient    Methods  Explanation;Demonstration;Verbal cues;Handout    Comprehension  Verbalized understanding;Returned demonstration       PT Short Term Goals - 05/14/19 1422      PT SHORT TERM GOAL #1   Title  Pt will perform HEP at least 2x/week to reduce pain and improve overall ability to complete ADLs.    Time  3    Period  Weeks    Status  New    Target Date  06/04/19      PT SHORT TERM GOAL #2   Title  Pt will demonstrate lumbar AROM WFL and pain-free to improve ability to complete self care tasks, household chores, and volunteering.    Time  3    Period  Weeks    Status  New        PT Long Term Goals - 05/14/19 1423      PT LONG TERM GOAL #1   Title  Pt will demonstrate RLE strength equal to LLE to improve gait mechanics, standing tolerance, and reduce pain with funcitonal activities.    Time  6    Period  Weeks    Status  New    Target Date  06/25/19      PT LONG TERM GOAL #2   Title  Pt will perform 5x STS in 15 sec, no UE assist, equal BLE weight-bearing and no increased R hip pain.    Time  6    Period  Weeks    Status  New      PT LONG TERM GOAL #3   Title  Pt will self report low back, hip and knee pain 5/10 at worst with standing and ambulation to indicate improvement in ability to grocery shop and navigate community.    Time  6    Period  Weeks    Status  New       PT LONG TERM GOAL #4   Title  Pt will improve FOTO score to 25% limited to indicate improvement in overall functional abilities and return to personal exercise program.    Time  6    Period  Weeks    Status  New             Plan - 05/14/19 1416    Clinical Impression Statement  Pt is a pleasant 73YO female with R hip, R knee and bil LBP. Pt demonstrates increased pain with functional activities and abnormal  gait pattern due to pain, mostly in R hip. Pt with insignificant leg length differences per ASIS to medial mal test, but does demonstrate increased genu valgus in closed kinetic chain positioning and weight-shift to LLE to alleviate pain in RLE. Pt with 3+ SIJ tests suggesting possible SIJ component to pain throughout low back and hip. Pt demonstrates deficits in RLE strength, lumbar AROM, increased lumbar lordosis in static standing, and pain with mobility. Pt would benefit from skilled PT interventions to address deficits noted and establish HEP to improve pain and overall QoL.    Personal Factors and Comorbidities  Past/Current Experience;Time since onset of injury/illness/exacerbation    Examination-Activity Limitations  Lift;Locomotion Level;Stand;Transfers    Examination-Participation Restrictions  Church;Cleaning;Meal Prep;Volunteer    Stability/Clinical Decision Making  Stable/Uncomplicated    Clinical Decision Making  Low    Rehab Potential  Good    PT Frequency  2x / week    PT Duration  6 weeks    PT Treatment/Interventions  ADLs/Self Care Home Management;Aquatic Therapy;Biofeedback;Cryotherapy;Moist Heat;Traction;Ultrasound;DME Instruction;Gait training;Stair training;Functional mobility training;Therapeutic activities;Therapeutic exercise;Balance training;Neuromuscular re-education;Patient/family education;Orthotic Fit/Training;Manual techniques;Passive range of motion;Dry needling;Taping;Joint Manipulations    PT Next Visit Plan  Review goals, HEP. SIJ cluster testing. Core  strengthening and hip/knee strengthening in supine/sidelying progressing to standing. Manual as needed for pain relief.    PT Home Exercise Plan  Eval: posterior pelvic tilt    Consulted and Agree with Plan of Care  Patient       Patient will benefit from skilled therapeutic intervention in order to improve the following deficits and impairments:  Abnormal gait, Decreased activity tolerance, Decreased balance, Decreased endurance, Decreased range of motion, Decreased strength, Difficulty walking, Increased fascial restricitons, Increased muscle spasms, Impaired perceived functional ability, Impaired flexibility, Impaired vision/preception, Improper body mechanics, Postural dysfunction, Pain  Visit Diagnosis: Chronic bilateral low back pain without sciatica - Plan: PT plan of care cert/re-cert  Pain in right hip - Plan: PT plan of care cert/re-cert  Chronic pain of right knee - Plan: PT plan of care cert/re-cert  Difficulty in walking, not elsewhere classified - Plan: PT plan of care cert/re-cert     Problem List Patient Active Problem List   Diagnosis Date Noted  . Knee pain, right 04/06/2019  . Left ankle sprain 04/06/2019  . Personal history of colonic polyps 07/12/2018  . Ingrown right big toenail 03/23/2017  . Hip pain, chronic, right 02/03/2016  . GERD (gastroesophageal reflux disease) 12/26/2014  . Diverticulosis of colon without hemorrhage 07/05/2014  . FH: pancreatic cancer 01/26/2014  . FH: glaucoma 03/24/2013  . Overweight (BMI 25.0-29.9) 03/13/2012  . Hyperlipidemia LDL goal <100 04/03/2009  . Essential hypertension 04/03/2009    Talbot Grumbling PT, DPT 05/14/19, 2:27 PM [facility] [address] [place], [place], [address] Phone: [phone]   Fax:  [phone]  Name: Adrienne [name] MRN: [id number] Date of Birth: [birth year]

## 2019-05-17 ENCOUNTER — Other Ambulatory Visit: Payer: Self-pay

## 2019-05-17 ENCOUNTER — Ambulatory Visit (HOSPITAL_COMMUNITY): Payer: Medicare Other

## 2019-05-17 ENCOUNTER — Encounter (HOSPITAL_COMMUNITY): Payer: Self-pay

## 2019-05-17 DIAGNOSIS — M25561 Pain in right knee: Secondary | ICD-10-CM | POA: Diagnosis not present

## 2019-05-17 DIAGNOSIS — M545 Low back pain, unspecified: Secondary | ICD-10-CM

## 2019-05-17 DIAGNOSIS — M25551 Pain in right hip: Secondary | ICD-10-CM

## 2019-05-17 DIAGNOSIS — G8929 Other chronic pain: Secondary | ICD-10-CM

## 2019-05-17 DIAGNOSIS — R262 Difficulty in walking, not elsewhere classified: Secondary | ICD-10-CM | POA: Diagnosis not present

## 2019-05-17 NOTE — Therapy (Signed)
[place], [place], [address] Phone: [phone]   Fax:  [phone]  Physical Therapy Treatment  [name] MRN: [id number] Date of Birth: 08/09/1945 Referring Provider (PT): Earlie Server, MD   Encounter Date: 05/17/2019  PT End of Session - 05/17/19 1457    Visit Number  2    Number of Visits  12    Authorization Type  Primary: Medicare, Secondary: [place] - Visit Number  2    Authorization - Number of Visits  10    PT Start Time  [id number]    PT Stop Time  [id number]    PT Time Calculation (min)  39 min    Activity Tolerance  Patient tolerated treatment well    Behavior During Therapy  [facility] for tasks assessed/performed       Past Medical History:  Diagnosis Date  . Allergy   . Diverticulosis 05/2009  . Hyperlipidemia   . Hypertension     Past Surgical History:  Procedure Laterality Date  . ABDOMINAL HYSTERECTOMY  1990   for fibroids ovaries remain   . CHOLECYSTECTOMY  1980  . COLONOSCOPY  05/2009   rare diverticula, small internal hemorrhoids. Next colonoscopy in 5-10 yrs.  . COLONOSCOPY N/A 10/23/2018   Procedure: COLONOSCOPY;  Surgeon: Danie Binder, MD;  [facility];  Service: Endoscopy;  Laterality: N/A;  2:00pm  . left nipple inversion bx benign  2000  . POLYPECTOMY  10/23/2018   Procedure: POLYPECTOMY;  Surgeon: Danie Binder, MD;  [facility];  Service: Endoscopy;;    There were no vitals filed for this visit.  Subjective Assessment - 05/17/19 1454    Subjective  Pt reports increased Rt hip/anterior groin pain today, intermittent sharp pain scale 5/10.  Has began HEP at least twice a day    Patient Stated Goals  help me to the point that I can walk a distance again    Currently in Pain?  Yes    Pain Score  5     Pain Location  Hip    Pain Orientation  Right    Pain Descriptors / Indicators  Sharp    Pain Onset  More than a  month ago    Pain Frequency  Intermittent    Aggravating Factors   certain movements    Pain Relieving Factors  exercise for back helps, stretches    Effect of Pain on Daily Activities  limits         [facility] PT Assessment - 05/17/19 0001      Assessment   Medical Diagnosis  R knee/hip pain, LBP    Referring Provider (PT)  Earlie Server, MD    Onset Date/Surgical Date  --   10-11 years, worsening since November 2020   Next MD Visit  none scheduled    Prior Therapy  yes, HEP for LBP      Precautions   Precautions  None                   [facility] Adult PT Treatment/Exercise - 05/17/19 0001      Exercises   Exercises  Lumbar      Lumbar Exercises: Supine   Pelvic Tilt  10 reps;5 seconds    Pelvic Tilt Limitations  posterior pelvic tilt    Clam  10 reps    Clam Limitations  RTB with ab set for stability  Bent Knee Raise  10 reps    Bent Knee Raise Limitations  with ab set, reports increased pain Rt hip with exercise    Bridge  10 reps    Bridge Limitations  with ab set    Other Supine Lumbar Exercises  Ball adduction 10x 5" with ab set    Other Supine Lumbar Exercises  Clam with RTB 10x each wiht core set             PT Education - 05/17/19 1506    Education Details  Reviewed goals, assured compliance with HEP, pt able to verbalize and demonstrate appropriate mechanics    Person(s) Educated  Patient    Methods  Explanation    Comprehension  Verbalized understanding;Returned demonstration       PT Short Term Goals - 05/14/19 1422      PT SHORT TERM GOAL #1   Title  Pt will perform HEP at least 2x/week to reduce pain and improve overall ability to complete ADLs.    Time  3    Period  Weeks    Status  New    Target Date  06/04/19      PT SHORT TERM GOAL #2   Title  Pt will demonstrate lumbar AROM WFL and pain-free to improve ability to complete self care tasks, household chores, and volunteering.    Time  3    Period  Weeks    Status  New         PT Long Term Goals - 05/14/19 1423      PT LONG TERM GOAL #1   Title  Pt will demonstrate RLE strength equal to LLE to improve gait mechanics, standing tolerance, and reduce pain with funcitonal activities.    Time  6    Period  Weeks    Status  New    Target Date  06/25/19      PT LONG TERM GOAL #2   Title  Pt will perform 5x STS in 15 sec, no UE assist, equal BLE weight-bearing and no increased R hip pain.    Time  6    Period  Weeks    Status  New      PT LONG TERM GOAL #3   Title  Pt will self report low back, hip and knee pain 5/10 at worst with standing and ambulation to indicate improvement in ability to grocery shop and navigate community.    Time  6    Period  Weeks    Status  New      PT LONG TERM GOAL #4   Title  Pt will improve FOTO score to 25% limited to indicate improvement in overall functional abilities and return to personal exercise program.    Time  6    Period  Weeks    Status  New            Plan - 05/17/19 1515    Clinical Impression Statement  Reviewed goals and assured compliance with HEP.  Pt able to verbalize and demonstrate appropriate mechanics with current HEP.  Session focus on core stability/proximal musculature strengthening.  Verbal cueing to activative abdominal sets prior LE movements.  No reports of pain throught session.  Plan to complete SIJ cluster testing next session with evaluation PT.    Personal Factors and Comorbidities  Past/Current Experience;Time since onset of injury/illness/exacerbation    Examination-Activity Limitations  Lift;Locomotion Level;Stand;Transfers    Examination-Participation Restrictions  Church;Cleaning;Meal Prep;Volunteer  Stability/Clinical Decision Making  Stable/Uncomplicated    Clinical Decision Making  Low    Rehab Potential  Good    PT Frequency  2x / week    PT Duration  6 weeks    PT Treatment/Interventions  ADLs/Self Care Home Management;Aquatic Therapy;Biofeedback;Cryotherapy;Moist  Heat;Traction;Ultrasound;DME Instruction;Gait training;Stair training;Functional mobility training;Therapeutic activities;Therapeutic exercise;Balance training;Neuromuscular re-education;Patient/family education;Orthotic Fit/Training;Manual techniques;Passive range of motion;Dry needling;Taping;Joint Manipulations    PT Next Visit Plan  SIJ cluster testing. Core strengthening and hip/knee strengthening in supine/sidelying progressing to standing. Manual as needed for pain relief.    PT Home Exercise Plan  Eval: posterior pelvic tilt       Patient will benefit from skilled therapeutic intervention in order to improve the following deficits and impairments:  Abnormal gait, Decreased activity tolerance, Decreased balance, Decreased endurance, Decreased range of motion, Decreased strength, Difficulty walking, Increased fascial restricitons, Increased muscle spasms, Impaired perceived functional ability, Impaired flexibility, Impaired vision/preception, Improper body mechanics, Postural dysfunction, Pain  Visit Diagnosis: Chronic pain of right knee  Difficulty in walking, not elsewhere classified  Pain in right hip  Chronic bilateral low back pain without sciatica     Problem List Patient Active Problem List   Diagnosis Date Noted  . Knee pain, right 04/06/2019  . Left ankle sprain 04/06/2019  . Personal history of colonic polyps 07/12/2018  . Ingrown right big toenail 03/23/2017  . Hip pain, chronic, right 02/03/2016  . GERD (gastroesophageal reflux disease) 12/26/2014  . Diverticulosis of colon without hemorrhage 07/05/2014  . FH: pancreatic cancer 01/26/2014  . FH: glaucoma 03/24/2013  . Overweight (BMI 25.0-29.9) 03/13/2012  . Hyperlipidemia LDL goal <100 04/03/2009  . Essential hypertension 04/03/2009   Ihor Austin, [place]; [place]  Aldona Lento 05/17/2019, 3:28 PM  [facility] [address] [place], [place],  [address] Phone: [phone]   Fax:  [phone]  Name: Kimbria Martens MRN: [id number] Date of Birth: Dec 08, 1945

## 2019-05-22 ENCOUNTER — Encounter (HOSPITAL_COMMUNITY): Payer: Self-pay

## 2019-05-22 ENCOUNTER — Telehealth (HOSPITAL_COMMUNITY): Payer: Self-pay

## 2019-05-22 ENCOUNTER — Other Ambulatory Visit: Payer: Self-pay

## 2019-05-22 ENCOUNTER — Ambulatory Visit (HOSPITAL_COMMUNITY): Payer: Medicare Other

## 2019-05-22 DIAGNOSIS — R262 Difficulty in walking, not elsewhere classified: Secondary | ICD-10-CM | POA: Diagnosis not present

## 2019-05-22 DIAGNOSIS — M25551 Pain in right hip: Secondary | ICD-10-CM | POA: Diagnosis not present

## 2019-05-22 DIAGNOSIS — M25561 Pain in right knee: Secondary | ICD-10-CM | POA: Diagnosis not present

## 2019-05-22 DIAGNOSIS — G8929 Other chronic pain: Secondary | ICD-10-CM | POA: Diagnosis not present

## 2019-05-22 DIAGNOSIS — M545 Low back pain: Secondary | ICD-10-CM | POA: Diagnosis not present

## 2019-05-22 NOTE — Therapy (Signed)
[place], [place], [address] Phone: [phone]   Fax:  [phone]  Physical Therapy Treatment  [name] MRN: [id number] Date of Birth: 09/26/1945 Referring Provider (PT): Earlie Server, MD   Encounter Date: 05/22/2019  PT End of Session - 05/22/19 1521    Visit Number  3    Number of Visits  12    Date for PT Re-Evaluation  06/25/19    Authorization Type  Primary: Medicare, Secondary: BCBS    Authorization - Visit Number  3    Authorization - Number of Visits  10    PT Start Time  1430    PT Stop Time  1515    PT Time Calculation (min)  45 min    Activity Tolerance  Patient tolerated treatment well    Behavior During Therapy  [facility] for tasks assessed/performed       Past Medical History:  Diagnosis Date  . Allergy   . Diverticulosis 05/2009  . Hyperlipidemia   . Hypertension     Past Surgical History:  Procedure Laterality Date  . ABDOMINAL HYSTERECTOMY  1990   for fibroids ovaries remain   . CHOLECYSTECTOMY  1980  . COLONOSCOPY  05/2009   rare diverticula, small internal hemorrhoids. Next colonoscopy in 5-10 yrs.  . COLONOSCOPY N/A 10/23/2018   Procedure: COLONOSCOPY;  Surgeon: Danie Binder, MD;  [facility];  Service: Endoscopy;  Laterality: N/A;  2:00pm  . left nipple inversion bx benign  2000  . POLYPECTOMY  10/23/2018   Procedure: POLYPECTOMY;  Surgeon: Danie Binder, MD;  [facility]: [facility];  Service: Endoscopy;;    There were no vitals filed for this visit.  Subjective Assessment - 05/22/19 1436    Subjective  Pt reports back pain has significantly improved since starting therapy. Pt reports she is currently medicated with 0/10 R hip pain in static standing and 3/10 R hip pain with ambulation. Pt reports doing her HEP at home.    Limitations  Standing;Walking;House hold activities    How long can you sit comfortably?  no issues    How long  can you stand comfortably?  10 minutes    How long can you walk comfortably?  20-30 minutes    Patient Stated Goals  help me to the point that I can walk a distance again    Currently in Pain?  Yes    Pain Score  3     Pain Location  Hip    Pain Orientation  Right    Pain Descriptors / Indicators  Aching    Pain Type  Chronic pain    Pain Onset  More than a month ago    Pain Frequency  Intermittent    Aggravating Factors   certain movements    Pain Relieving Factors  exercise for back help, stretches, medication    Effect of Pain on Daily Activities  limits               [facility] Adult PT Treatment/Exercise - 05/22/19 0001      Lumbar Exercises: Stretches   Hip Flexor Stretch  Right;3 reps;30 seconds    Hip Flexor Stretch Limitations  with bedsheet      Lumbar Exercises: Aerobic   Nustep  level 3, BUE/LE, 4 minutes      Lumbar Exercises: Standing   Other Standing Lumbar Exercises  forward step ups, 6" step, BUE support, RLE  x10 reps    Other Standing Lumbar Exercises  lateral step ups, 4" step, RLE x10 reps; sidestepping, GTB around thighs, x2RT      Lumbar Exercises: Supine   Ab Set  20 reps    AB Set Limitations  with posterior pelvic tilt    Bridge  10 reps    Bridge Limitations  cues for glute activation      Lumbar Exercises: Sidelying   Clam Limitations  attempted R, but too painful in R hip and knee    Hip Abduction  Right;10 reps             PT Education - 05/22/19 1520    Education Details  Exercise technique, updated HEP    Person(s) Educated  Patient    Methods  Explanation;Demonstration;Verbal cues;Handout    Comprehension  Verbalized understanding;Returned demonstration       PT Short Term Goals - 05/22/19 1523      PT SHORT TERM GOAL #1   Title  Pt will perform HEP at least 2x/week to reduce pain and improve overall ability to complete ADLs.    Time  3    Period  Weeks    Status  On-going    Target Date  06/04/19      PT SHORT TERM  GOAL #2   Title  Pt will demonstrate lumbar AROM WFL and pain-free to improve ability to complete self care tasks, household chores, and volunteering.    Time  3    Period  Weeks    Status  On-going        PT Long Term Goals - 05/22/19 1524      PT LONG TERM GOAL #1   Title  Pt will demonstrate RLE strength equal to LLE to improve gait mechanics, standing tolerance, and reduce pain with funcitonal activities.    Time  6    Period  Weeks    Status  On-going      PT LONG TERM GOAL #2   Title  Pt will perform 5x STS in 15 sec, no UE assist, equal BLE weight-bearing and no increased R hip pain.    Time  6    Period  Weeks    Status  On-going      PT LONG TERM GOAL #3   Title  Pt will self report low back, hip and knee pain 5/10 at worst with standing and ambulation to indicate improvement in ability to grocery shop and navigate community.    Time  6    Period  Weeks    Status  On-going      PT LONG TERM GOAL #4   Title  Pt will improve FOTO score to 25% limited to indicate improvement in overall functional abilities and return to personal exercise program.    Time  6    Period  Weeks    Status  On-going            Plan - 05/22/19 1522    Clinical Impression Statement  Began treatment session with NuStep for cardio and decreased weight-bearing strengthening for R hip. Continued with standing hip exercises with mild fatigue and discomfort, but able to complete with good form. Ended with abdominal isometric contractions for lumbar stability and to decrease flare up caused by lateral sidestepping with theraband. Educated pt on prone hip flexor stretch with bed sheet and pt demonstrates good carryover. Pt with 3 positive SI cluster tests, will continue to monitor symptoms with mobility.  Continue to progress as able.    Personal Factors and Comorbidities  Past/Current Experience;Time since onset of injury/illness/exacerbation    Examination-Activity Limitations  Lift;Locomotion  Level;Stand;Transfers    Examination-Participation Restrictions  Church;Cleaning;Meal Prep;Volunteer    Stability/Clinical Decision Making  Stable/Uncomplicated    Rehab Potential  Good    PT Frequency  2x / week    PT Duration  6 weeks    PT Treatment/Interventions  ADLs/Self Care Home Management;Aquatic Therapy;Biofeedback;Cryotherapy;Moist Heat;Traction;Ultrasound;DME Instruction;Gait training;Stair training;Functional mobility training;Therapeutic activities;Therapeutic exercise;Balance training;Neuromuscular re-education;Patient/family education;Orthotic Fit/Training;Manual techniques;Passive range of motion;Dry needling;Taping;Joint Manipulations    PT Next Visit Plan  Progress core strengthening and hip/knee strengthening. Manual as needed for pain relief.    PT Home Exercise Plan  Eval: posterior pelvic tilt; 1/26: prone hip flexor stretch with bedsheet    Consulted and Agree with Plan of Care  Patient       Patient will benefit from skilled therapeutic intervention in order to improve the following deficits and impairments:  Abnormal gait, Decreased activity tolerance, Decreased balance, Decreased endurance, Decreased range of motion, Decreased strength, Difficulty walking, Increased fascial restricitons, Increased muscle spasms, Impaired perceived functional ability, Impaired flexibility, Impaired vision/preception, Improper body mechanics, Postural dysfunction, Pain  Visit Diagnosis: Chronic bilateral low back pain without sciatica  Pain in right hip  Chronic pain of right knee  Difficulty in walking, not elsewhere classified     Problem List Patient Active Problem List   Diagnosis Date Noted  . Knee pain, right 04/06/2019  . Left ankle sprain 04/06/2019  . Personal history of colonic polyps 07/12/2018  . Ingrown right big toenail 03/23/2017  . Hip pain, chronic, right 02/03/2016  . GERD (gastroesophageal reflux disease) 12/26/2014  . Diverticulosis of colon without  hemorrhage 07/05/2014  . FH: pancreatic cancer 01/26/2014  . FH: glaucoma 03/24/2013  . Overweight (BMI 25.0-29.9) 03/13/2012  . Hyperlipidemia LDL goal <100 04/03/2009  . Essential hypertension 04/03/2009     Talbot Grumbling PT, DPT 05/22/19, 3:25 PM [place] [address] [place], [place], [address] Phone: [phone]   Fax:  [phone]  Name: Adrienne [name] MRN: [id number] Date of Birth: 09-23-1945

## 2019-05-22 NOTE — Telephone Encounter (Signed)
Therapist called referring physician's office (Dr. French Ana) regarding pt's most recent x-rays. Spoke with x-ray department who is faxing over information by end of today. Thank you.  Talbot Grumbling PT, DPT 05/22/19, 3:45 PM [phone]

## 2019-05-24 ENCOUNTER — Encounter (HOSPITAL_COMMUNITY): Payer: Self-pay | Admitting: Physical Therapy

## 2019-05-24 ENCOUNTER — Other Ambulatory Visit: Payer: Self-pay

## 2019-05-24 ENCOUNTER — Ambulatory Visit (HOSPITAL_COMMUNITY): Payer: Medicare Other | Admitting: Physical Therapy

## 2019-05-24 DIAGNOSIS — M545 Low back pain, unspecified: Secondary | ICD-10-CM

## 2019-05-24 DIAGNOSIS — R262 Difficulty in walking, not elsewhere classified: Secondary | ICD-10-CM

## 2019-05-24 DIAGNOSIS — M25561 Pain in right knee: Secondary | ICD-10-CM

## 2019-05-24 DIAGNOSIS — M25551 Pain in right hip: Secondary | ICD-10-CM | POA: Diagnosis not present

## 2019-05-24 DIAGNOSIS — G8929 Other chronic pain: Secondary | ICD-10-CM

## 2019-05-24 NOTE — Therapy (Signed)
Adrienne [name], [place], [address] Phone: [phone]   Fax:  [phone]  Physical Therapy Treatment  [name] MRN: [id number] Date of Birth: 05-04-1945 Referring Provider (PT): Adrienne Server, MD   Encounter Date: 05/24/2019  PT End of Session - 05/24/19 1634    Visit Number  4    Number of Visits  12    Date for PT Re-Evaluation  06/25/19    Authorization Type  Primary: Medicare, Secondary: BCBS    Authorization - Visit Number  4    Authorization - Number of Visits  10    PT Start Time  1450    PT Stop Time  1533    PT Time Calculation (min)  43 min    Activity Tolerance  Patient tolerated treatment well    Behavior During Therapy  [facility] for tasks assessed/performed       Past Medical History:  Diagnosis Date  . Allergy   . Diverticulosis 05/2009  . Hyperlipidemia   . Hypertension     Past Surgical History:  Procedure Laterality Date  . ABDOMINAL HYSTERECTOMY  1990   for fibroids ovaries remain   . CHOLECYSTECTOMY  1980  . COLONOSCOPY  05/2009   rare diverticula, small internal hemorrhoids. Next colonoscopy in 5-10 yrs.  . COLONOSCOPY N/A 10/23/2018   Procedure: COLONOSCOPY;  Surgeon: Adrienne Binder, MD;  [facility];  Service: Endoscopy;  Laterality: N/A;  2:00pm  . left nipple inversion bx benign  2000  . POLYPECTOMY  10/23/2018   Procedure: POLYPECTOMY;  Surgeon: Adrienne Binder, MD;  [facility];  Service: Endoscopy;;    There were no vitals filed for this visit.  Subjective Assessment - 05/24/19 1455    Subjective  Pt did the exercises with the sheet as well as the exercise with her back.  She has had more pain in her hip today than usual.    Limitations  Standing;Walking;House hold activities    How long can you sit comfortably?  no issues    How long can you stand comfortably?  10 minutes    How long can you walk comfortably?  20-30 minutes     Patient Stated Goals  help me to the point that I can walk a distance again    Currently in Pain?  Yes    Pain Score  5     Pain Location  Hip    Pain Orientation  Right    Pain Descriptors / Indicators  Aching    Pain Type  Chronic pain    Pain Onset  More than a month ago    Pain Frequency  Intermittent    Multiple Pain Sites  Yes              [facility] Adult PT Treatment/Exercise - 05/24/19 0001      Exercises   Exercises  Lumbar      Lumbar Exercises: Stretches   Single Knee to Chest Stretch  Right;3 reps;30 seconds    Hip Flexor Stretch  Right;3 reps;30 seconds    Hip Flexor Stretch Limitations  modified thomas test     Prone on Elbows Stretch  2 reps;60 seconds    Prone on Elbows Stretch Limitations  diaphragmic breathing     Quad Stretch  Right;Left;2 reps;30 seconds      Lumbar Exercises: Standing   Forward Lunge  10 reps    Other Standing  Lumbar Exercises  forward step ups, 6" step, BUE support, RLE x10 reps    Other Standing Lumbar Exercises  lateral step ups, 4" step, RLE x10 reps; sidestepping, GTB around thighs, x2RT      Lumbar Exercises: Supine   Clam  10 reps    Bridge  10 reps    Bridge Limitations  hold 10 seconds                PT Short Term Goals - 05/22/19 1523      PT SHORT TERM GOAL #1   Title  Pt will perform HEP at least 2x/week to reduce pain and improve overall ability to complete ADLs.    Time  3    Period  Weeks    Status  On-going    Target Date  06/04/19      PT SHORT TERM GOAL #2   Title  Pt will demonstrate lumbar AROM WFL and pain-free to improve ability to complete self care tasks, household chores, and volunteering.    Time  3    Period  Weeks    Status  On-going        PT Long Term Goals - 05/22/19 1524      PT LONG TERM GOAL #1   Title  Pt will demonstrate RLE strength equal to LLE to improve gait mechanics, standing tolerance, and reduce pain with funcitonal activities.    Time  6    Period  Weeks     Status  On-going      PT LONG TERM GOAL #2   Title  Pt will perform 5x STS in 15 sec, no UE assist, equal BLE weight-bearing and no increased R hip pain.    Time  6    Period  Weeks    Status  On-going      PT LONG TERM GOAL #3   Title  Pt will self report low back, hip and knee pain 5/10 at worst with standing and ambulation to indicate improvement in ability to grocery shop and navigate community.    Time  6    Period  Weeks    Status  On-going      PT LONG TERM GOAL #4   Title  Pt will improve FOTO score to 25% limited to indicate improvement in overall functional abilities and return to personal exercise program.    Time  6    Period  Weeks    Status  On-going            Plan - 05/24/19 1634    Clinical Impression Statement  Instructed pt in prone exercises with good form after verbal cuing.  Began modified thomas stretch to stretch psoas mm. Pt has noted gluteal weakness B but greater on RT.  Pt will continue to benefit from skilled therapy for strengthening, improved ROM and balance    Personal Factors and Comorbidities  Past/Current Experience;Time since onset of injury/illness/exacerbation    Examination-Activity Limitations  Lift;Locomotion Level;Stand;Transfers    Examination-Participation Restrictions  Church;Cleaning;Meal Prep;Volunteer    Stability/Clinical Decision Making  Stable/Uncomplicated    Rehab Potential  Good    PT Frequency  2x / week    PT Duration  6 weeks    PT Treatment/Interventions  ADLs/Self Care Home Management;Aquatic Therapy;Biofeedback;Cryotherapy;Moist Heat;Traction;Ultrasound;DME Instruction;Gait training;Stair training;Functional mobility training;Therapeutic activities;Therapeutic exercise;Balance training;Neuromuscular re-education;Patient/family education;Orthotic Fit/Training;Manual techniques;Passive range of motion;Dry needling;Taping;Joint Manipulations    PT Next Visit Plan  Progress core strengthening and hip/knee strengthening.  Manual  as needed for pain relief.    PT Home Exercise Plan  Eval: posterior pelvic tilt; 1/26: prone hip flexor stretch with bedsheet    Consulted and Agree with Plan of Care  Patient       Patient will benefit from skilled therapeutic intervention in order to improve the following deficits and impairments:  Abnormal gait, Decreased activity tolerance, Decreased balance, Decreased endurance, Decreased range of motion, Decreased strength, Difficulty walking, Increased fascial restricitons, Increased muscle spasms, Impaired perceived functional ability, Impaired flexibility, Impaired vision/preception, Improper body mechanics, Postural dysfunction, Pain  Visit Diagnosis: Chronic bilateral low back pain without sciatica  Pain in right hip  Chronic pain of right knee  Difficulty in walking, not elsewhere classified     Problem List Patient Active Problem List   Diagnosis Date Noted  . Knee pain, right 04/06/2019  . Left ankle sprain 04/06/2019  . Personal history of colonic polyps 07/12/2018  . Ingrown right big toenail 03/23/2017  . Hip pain, chronic, right 02/03/2016  . GERD (gastroesophageal reflux disease) 12/26/2014  . Diverticulosis of colon without hemorrhage 07/05/2014  . FH: pancreatic cancer 01/26/2014  . FH: glaucoma 03/24/2013  . Overweight (BMI 25.0-29.9) 03/13/2012  . Hyperlipidemia LDL goal <100 04/03/2009  . Essential hypertension 04/03/2009    Rayetta Humphrey, PT CLT [phone] 05/24/2019, 4:37 PM  [place] [address] [place], [place], [address] Phone: [phone]   Fax:  [phone]  Name: Dorena Lazzara MRN: [id number] Date of Birth: 1946-02-09

## 2019-05-29 ENCOUNTER — Encounter (HOSPITAL_COMMUNITY): Payer: Self-pay

## 2019-05-29 ENCOUNTER — Ambulatory Visit (HOSPITAL_COMMUNITY): Payer: Medicare Other | Admitting: Physical Therapy

## 2019-05-29 ENCOUNTER — Other Ambulatory Visit: Payer: Self-pay

## 2019-05-29 ENCOUNTER — Ambulatory Visit (HOSPITAL_COMMUNITY): Payer: Medicare Other | Attending: Orthopedic Surgery

## 2019-05-29 DIAGNOSIS — M545 Low back pain: Secondary | ICD-10-CM | POA: Insufficient documentation

## 2019-05-29 DIAGNOSIS — G8929 Other chronic pain: Secondary | ICD-10-CM

## 2019-05-29 DIAGNOSIS — R262 Difficulty in walking, not elsewhere classified: Secondary | ICD-10-CM | POA: Diagnosis not present

## 2019-05-29 DIAGNOSIS — M25561 Pain in right knee: Secondary | ICD-10-CM | POA: Diagnosis not present

## 2019-05-29 DIAGNOSIS — M25551 Pain in right hip: Secondary | ICD-10-CM | POA: Diagnosis not present

## 2019-05-29 NOTE — Therapy (Signed)
[facility] [place], [place], [address] Phone: [phone]   Fax:  [phone]  Physical Therapy Treatment  [name] MRN: [id number] Date of Birth: [birth year] Referring Provider (PT): Earlie Server, MD   Encounter Date: 05/29/2019  PT End of Session - 05/29/19 0959    Visit Number  5    Number of Visits  12    Date for PT Re-Evaluation  06/25/19    Authorization Type  Primary: Medicare, Secondary: [place] - Visit Number  5    Authorization - Number of Visits  10    PT Start Time  0902    PT Stop Time  0945    PT Time Calculation (min)  43 min    Activity Tolerance  Patient tolerated treatment well;No increased pain    Behavior During Therapy  WFL for tasks assessed/performed       Past Medical History:  Diagnosis Date  . Allergy   . Diverticulosis 05/2009  . Hyperlipidemia   . Hypertension     Past Surgical History:  Procedure Laterality Date  . ABDOMINAL HYSTERECTOMY  1990   for fibroids ovaries remain   . CHOLECYSTECTOMY  1980  . COLONOSCOPY  05/2009   rare diverticula, small internal hemorrhoids. Next colonoscopy in 5-10 yrs.  . COLONOSCOPY N/A 10/23/2018   Procedure: COLONOSCOPY;  Surgeon: Danie Binder, MD;  [facility];  Service: Endoscopy;  Laterality: N/A;  2:00pm  . left nipple inversion bx benign  2000  . POLYPECTOMY  10/23/2018   Procedure: POLYPECTOMY;  Surgeon: Danie Binder, MD;  [facility];  Service: Endoscopy;;    There were no vitals filed for this visit.  Subjective Assessment - 05/29/19 0904    Subjective  Pt reports 7/10 pain in her back and R hip and knee. Pt reports being more sore than normal after last session, stayed sore, and isn't sure if it's the weather change or what. Pt reports it is hard to get in/out of bed and a chair and it gets worse as the day goes on. Pt reports increase in bowel movements, but no lack  of control. Pt reports tingling sensation in low back radiating down to bil buttocks, denies saddle paraesthesias.    Limitations  Standing;Walking;House hold activities    How long can you sit comfortably?  no issues    How long can you stand comfortably?  10 minutes    How long can you walk comfortably?  20-30 minutes    Patient Stated Goals  help me to the point that I can walk a distance again    Currently in Pain?  Yes    Pain Score  7     Pain Location  Back   R hip and R knee   Pain Orientation  Right    Pain Descriptors / Indicators  Aching    Pain Type  Chronic pain    Pain Onset  More than a month ago    Pain Frequency  Intermittent    Aggravating Factors   certain movements    Pain Relieving Factors  exercise for back help, stretches, medication    Effect of Pain on Daily Activities  limits           [facility] Adult PT Treatment/Exercise - 05/29/19 0001      Lumbar Exercises: Stretches   Single Knee to Chest Stretch  Right;Left;5 reps;10 seconds  Single Knee to Chest Stretch Limitations  R smaller ROM for comfort    Hip Flexor Stretch  Right    Hip Flexor Stretch Limitations  attempted, but unable      Lumbar Exercises: Supine   Ab Set  20 reps;3 seconds    AB Set Limitations  with exhale    Pelvic Tilt  20 reps;5 seconds    Pelvic Tilt Limitations  posterior pelvic tilt    Bridge  15 reps    Bridge with clamshell  5 reps    Bridge with [employer] Limitations  RTB around thighs    Other Supine Lumbar Exercises  bent knee fall out, hooklying position, with posterior pelvic tilt to engage core, x5 reps each leg      Manual Therapy   Manual Therapy  Manual Traction    Manual therapy comments  all manual completed seperate from rest of interventions    Manual Traction  long axis traction to RLE to reduce pain in R hip joint, static hold, 30 sec increments with good results             PT Education - 05/29/19 0959    Education Details  Exercise technique,  continue HEP, reviewed x-ray note from referring MD, benefits of manual long axis traction    Person(s) Educated  Patient    Methods  Explanation    Comprehension  Verbalized understanding       PT Short Term Goals - 05/22/19 1523      PT SHORT TERM GOAL #1   Title  Pt will perform HEP at least 2x/week to reduce pain and improve overall ability to complete ADLs.    Time  3    Period  Weeks    Status  On-going    Target Date  06/04/19      PT SHORT TERM GOAL #2   Title  Pt will demonstrate lumbar AROM WFL and pain-free to improve ability to complete self care tasks, household chores, and volunteering.    Time  3    Period  Weeks    Status  On-going        PT Long Term Goals - 05/22/19 1524      PT LONG TERM GOAL #1   Title  Pt will demonstrate RLE strength equal to LLE to improve gait mechanics, standing tolerance, and reduce pain with funcitonal activities.    Time  6    Period  Weeks    Status  On-going      PT LONG TERM GOAL #2   Title  Pt will perform 5x STS in 15 sec, no UE assist, equal BLE weight-bearing and no increased R hip pain.    Time  6    Period  Weeks    Status  On-going      PT LONG TERM GOAL #3   Title  Pt will self report low back, hip and knee pain 5/10 at worst with standing and ambulation to indicate improvement in ability to grocery shop and navigate community.    Time  6    Period  Weeks    Status  On-going      PT LONG TERM GOAL #4   Title  Pt will improve FOTO score to 25% limited to indicate improvement in overall functional abilities and return to personal exercise program.    Time  6    Period  Weeks    Status  On-going  Plan - 05/29/19 0959    Clinical Impression Statement  Pt with high pain upon arrival, initiated treatment with stretches. Attempted Thomas stretch, but unable to tolerate R Thomas stretch due to increasing low back arch and pain so discontinued. Continued with core strengthening and glute strengthening  in supine, pt reports 2/10 pain and tolerable with all exercises. Added manual long axis traction to RLE to improve R hip joint pain with positive results, pt reports centralization of radiating pain from anterior hip down RLE and denies pain upon standing. Pt ambulates with antalgic gait upon arrival, improving to normal gait pattern after manual traction and exercise. Continue to progress as able.    Personal Factors and Comorbidities  Past/Current Experience;Time since onset of injury/illness/exacerbation    Examination-Activity Limitations  Lift;Locomotion Level;Stand;Transfers    Examination-Participation Restrictions  Church;Cleaning;Meal Prep;Volunteer    Stability/Clinical Decision Making  Stable/Uncomplicated    Rehab Potential  Good    PT Frequency  2x / week    PT Duration  6 weeks    PT Treatment/Interventions  ADLs/Self Care Home Management;Aquatic Therapy;Biofeedback;Cryotherapy;Moist Heat;Traction;Ultrasound;DME Instruction;Gait training;Stair training;Functional mobility training;Therapeutic activities;Therapeutic exercise;Balance training;Neuromuscular re-education;Patient/family education;Orthotic Fit/Training;Manual techniques;Passive range of motion;Dry needling;Taping;Joint Manipulations    PT Next Visit Plan  Progress core strengthening and hip/knee strengthening. Manual as needed for pain relief. Long axis traction for RLE for improved R hip pain PRN.    PT Home Exercise Plan  Eval: posterior pelvic tilt; 1/26: prone hip flexor stretch with bedsheet    Consulted and Agree with Plan of Care  Patient       Patient will benefit from skilled therapeutic intervention in order to improve the following deficits and impairments:  Abnormal gait, Decreased activity tolerance, Decreased balance, Decreased endurance, Decreased range of motion, Decreased strength, Difficulty walking, Increased fascial restricitons, Increased muscle spasms, Impaired perceived functional ability, Impaired  flexibility, Impaired vision/preception, Improper body mechanics, Postural dysfunction, Pain  Visit Diagnosis: Chronic bilateral low back pain without sciatica  Pain in right hip  Chronic pain of right knee  Difficulty in walking, not elsewhere classified     Problem List Patient Active Problem List   Diagnosis Date Noted  . Knee pain, right 04/06/2019  . Left ankle sprain 04/06/2019  . Personal history of colonic polyps 07/12/2018  . Ingrown right big toenail 03/23/2017  . Hip pain, chronic, right 02/03/2016  . GERD (gastroesophageal reflux disease) 12/26/2014  . Diverticulosis of colon without hemorrhage 07/05/2014  . FH: pancreatic cancer 01/26/2014  . FH: glaucoma 03/24/2013  . Overweight (BMI 25.0-29.9) 03/13/2012  . Hyperlipidemia LDL goal <100 04/03/2009  . Essential hypertension 04/03/2009     Talbot Grumbling PT, DPT 05/29/19, 10:02 AM [facility] [address] [place], [place], [address] Phone: [phone]   Fax:  [phone]  Name: Adrienne [name] MRN: [id number] Date of Birth: Sep 28, 1945

## 2019-05-31 ENCOUNTER — Other Ambulatory Visit: Payer: Self-pay

## 2019-05-31 ENCOUNTER — Ambulatory Visit (HOSPITAL_COMMUNITY): Payer: Medicare Other | Admitting: Physical Therapy

## 2019-05-31 DIAGNOSIS — R262 Difficulty in walking, not elsewhere classified: Secondary | ICD-10-CM | POA: Diagnosis not present

## 2019-05-31 DIAGNOSIS — M25551 Pain in right hip: Secondary | ICD-10-CM

## 2019-05-31 DIAGNOSIS — G8929 Other chronic pain: Secondary | ICD-10-CM

## 2019-05-31 DIAGNOSIS — M545 Low back pain, unspecified: Secondary | ICD-10-CM

## 2019-05-31 DIAGNOSIS — M25561 Pain in right knee: Secondary | ICD-10-CM | POA: Diagnosis not present

## 2019-05-31 NOTE — Therapy (Signed)
[facility], [place], [address] Phone: [phone]   Fax:  [phone]  Physical Therapy Treatment  [name] MRN: [id number] Date of Birth: Apr 19, 1946 Referring Provider (PT): Earlie Server, MD   Encounter Date: 05/31/2019  PT End of Session - 05/31/19 [id number]    Visit Number  6    Number of Visits  12    Date for PT Re-Evaluation  06/25/19    Authorization Type  Primary: Medicare, Secondary: [place] - Visit Number  6    Authorization - Number of Visits  10    PT Start Time  [id number]    PT Stop Time  1620    PT Time Calculation (min)  45 min    Activity Tolerance  Patient tolerated treatment well;No increased pain    Behavior During Therapy  WFL for tasks assessed/performed       Past Medical History:  Diagnosis Date  . Allergy   . Diverticulosis 05/2009  . Hyperlipidemia   . Hypertension     Past Surgical History:  Procedure Laterality Date  . ABDOMINAL HYSTERECTOMY  1990   for fibroids ovaries remain   . CHOLECYSTECTOMY  1980  . COLONOSCOPY  05/2009   rare diverticula, small internal hemorrhoids. Next colonoscopy in 5-10 yrs.  . COLONOSCOPY N/A 10/23/2018   Procedure: COLONOSCOPY;  Surgeon: Danie Binder, MD;  [facility];  Service: Endoscopy;  Laterality: N/A;  2:00pm  . left nipple inversion bx benign  2000  . POLYPECTOMY  10/23/2018   Procedure: POLYPECTOMY;  Surgeon: Danie Binder, MD;  [facility];  Service: Endoscopy;;    There were no vitals filed for this visit.  Subjective Assessment - 05/31/19 1638    Subjective  pt states her pain is no worse/no better.  STates most of it is in her Rt LE, radiating pain.  Currently 7/10.    Currently in Pain?  Yes    Pain Score  7     Pain Location  Back    Pain Orientation  Right    Pain Descriptors / Indicators  Aching;Radiating    Pain Type  Chronic pain                        [facility] Adult PT Treatment/Exercise - 05/31/19 0001      Lumbar Exercises: Stretches   Single Knee to Chest Stretch  Right;Left;5 reps;10 seconds    Single Knee to Chest Stretch Limitations  R smaller ROM for comfort    Lower Trunk Rotation  5 reps;10 seconds      Lumbar Exercises: Standing   Other Standing Lumbar Exercises  hip excursions 10 reps each      Lumbar Exercises: Seated   Sit to Stand  10 reps;Limitations    Sit to Stand Limitations  no UE      Lumbar Exercises: Supine   Ab Set  20 reps;3 seconds    AB Set Limitations  with exhale    Pelvic Tilt  20 reps;5 seconds    Pelvic Tilt Limitations  posterior pelvic tilt    Clam  10 reps    Heel Slides  10 reps    Bridge  15 reps      Manual Therapy   Manual Therapy  Manual Traction    Manual therapy comments  all manual completed seperate from rest of interventions  Manual Traction  long axis traction to RLE to reduce pain in R hip joint, static hold, 30 sec increments with good results               PT Short Term Goals - 05/22/19 1523      PT SHORT TERM GOAL #1   Title  Pt will perform HEP at least 2x/week to reduce pain and improve overall ability to complete ADLs.    Time  3    Period  Weeks    Status  On-going    Target Date  06/04/19      PT SHORT TERM GOAL #2   Title  Pt will demonstrate lumbar AROM WFL and pain-free to improve ability to complete self care tasks, household chores, and volunteering.    Time  3    Period  Weeks    Status  On-going        PT Long Term Goals - 05/22/19 1524      PT LONG TERM GOAL #1   Title  Pt will demonstrate RLE strength equal to LLE to improve gait mechanics, standing tolerance, and reduce pain with funcitonal activities.    Time  6    Period  Weeks    Status  On-going      PT LONG TERM GOAL #2   Title  Pt will perform 5x STS in 15 sec, no UE assist, equal BLE weight-bearing and no increased R hip pain.    Time  6     Period  Weeks    Status  On-going      PT LONG TERM GOAL #3   Title  Pt will self report low back, hip and knee pain 5/10 at worst with standing and ambulation to indicate improvement in ability to grocery shop and navigate community.    Time  6    Period  Weeks    Status  On-going      PT LONG TERM GOAL #4   Title  Pt will improve FOTO score to 25% limited to indicate improvement in overall functional abilities and return to personal exercise program.    Time  6    Period  Weeks    Status  On-going            Plan - 05/31/19 1639    Clinical Impression Statement  Continues to have high pain levels that is unchanged with therex/activity.  Did report some resolve with manual traction to Rt LE last session.  continued with this with addition of standing hip excurions and sit to stands to work on [employer].  Pt reported slight improvement at EOS.    Personal Factors and Comorbidities  Past/Current Experience;Time since onset of injury/illness/exacerbation    Examination-Activity Limitations  Lift;Locomotion Level;Stand;Transfers    Examination-Participation Restrictions  Church;Cleaning;Meal Prep;Volunteer    Stability/Clinical Decision Making  Stable/Uncomplicated    Rehab Potential  Good    PT Frequency  2x / week    PT Duration  6 weeks    PT Treatment/Interventions  ADLs/Self Care Home Management;Aquatic Therapy;Biofeedback;Cryotherapy;Moist Heat;Traction;Ultrasound;DME Instruction;Gait training;Stair training;Functional mobility training;Therapeutic activities;Therapeutic exercise;Balance training;Neuromuscular re-education;Patient/family education;Orthotic Fit/Training;Manual techniques;Passive range of motion;Dry needling;Taping;Joint Manipulations    PT Next Visit Plan  Progress core strengthening and hip/knee strengthening. Manual as needed for pain relief. Long axis traction for RLE for improved R hip pain PRN.    PT Home Exercise Plan  Eval: posterior pelvic tilt; 1/26:  prone hip flexor stretch with bedsheet  Consulted and Agree with Plan of Care  Patient       Patient will benefit from skilled therapeutic intervention in order to improve the following deficits and impairments:  Abnormal gait, Decreased activity tolerance, Decreased balance, Decreased endurance, Decreased range of motion, Decreased strength, Difficulty walking, Increased fascial restricitons, Increased muscle spasms, Impaired perceived functional ability, Impaired flexibility, Impaired vision/preception, Improper body mechanics, Postural dysfunction, Pain  Visit Diagnosis: Pain in right hip  Chronic pain of right knee  Chronic bilateral low back pain without sciatica  Difficulty in walking, not elsewhere classified     Problem List Patient Active Problem List   Diagnosis Date Noted  . Knee pain, right 04/06/2019  . Left ankle sprain 04/06/2019  . Personal history of colonic polyps 07/12/2018  . Ingrown right big toenail 03/23/2017  . Hip pain, chronic, right 02/03/2016  . GERD (gastroesophageal reflux disease) 12/26/2014  . Diverticulosis of colon without hemorrhage 07/05/2014  . FH: pancreatic cancer 01/26/2014  . FH: glaucoma 03/24/2013  . Overweight (BMI 25.0-29.9) 03/13/2012  . Hyperlipidemia LDL goal <100 04/03/2009  . Essential hypertension 04/03/2009   Teena Irani, PTA/CLT [phone]  Teena Irani 05/31/2019, 4:43 PM  [facility] [address] [place], [place], [address] Phone: [phone]   Fax:  [phone]  Name: Shantell Remaly MRN: [id number] Date of Birth: 24-Mar-1946

## 2019-06-05 ENCOUNTER — Ambulatory Visit (HOSPITAL_COMMUNITY): Payer: Medicare Other

## 2019-06-05 ENCOUNTER — Other Ambulatory Visit: Payer: Self-pay

## 2019-06-05 ENCOUNTER — Encounter (HOSPITAL_COMMUNITY): Payer: Self-pay

## 2019-06-05 DIAGNOSIS — G8929 Other chronic pain: Secondary | ICD-10-CM | POA: Diagnosis not present

## 2019-06-05 DIAGNOSIS — R262 Difficulty in walking, not elsewhere classified: Secondary | ICD-10-CM | POA: Diagnosis not present

## 2019-06-05 DIAGNOSIS — M25551 Pain in right hip: Secondary | ICD-10-CM

## 2019-06-05 DIAGNOSIS — M25561 Pain in right knee: Secondary | ICD-10-CM | POA: Diagnosis not present

## 2019-06-05 DIAGNOSIS — M545 Low back pain: Secondary | ICD-10-CM | POA: Diagnosis not present

## 2019-06-05 NOTE — Therapy (Signed)
[facility] [place], [place], [address] Phone: [phone]   Fax:  [phone]  Physical Therapy Treatment  [name] MRN: [id number] Date of Birth: July 18, 1945 Referring Provider (PT): Earlie Server, MD   Encounter Date: 06/05/2019  PT End of Session - 06/05/19 1524    Visit Number  7    Number of Visits  12    Date for PT Re-Evaluation  06/25/19    Authorization Type  Primary: Medicare, Secondary: [place] - Visit Number  7    Authorization - Number of Visits  10    PT Start Time  [id number]    PT Stop Time  [id number]    PT Time Calculation (min)  46 min    Activity Tolerance  Patient tolerated treatment well;No increased pain    Behavior During Therapy  WFL for tasks assessed/performed       Past Medical History:  Diagnosis Date  . Allergy   . Diverticulosis 05/2009  . Hyperlipidemia   . Hypertension     Past Surgical History:  Procedure Laterality Date  . ABDOMINAL HYSTERECTOMY  1990   for fibroids ovaries remain   . CHOLECYSTECTOMY  1980  . COLONOSCOPY  05/2009   rare diverticula, small internal hemorrhoids. Next colonoscopy in 5-10 yrs.  . COLONOSCOPY N/A 10/23/2018   Procedure: COLONOSCOPY;  Surgeon: Danie Binder, MD;  [facility];  Service: Endoscopy;  Laterality: N/A;  2:00pm  . left nipple inversion bx benign  2000  . POLYPECTOMY  10/23/2018   Procedure: POLYPECTOMY;  Surgeon: Danie Binder, MD;  [facility];  Service: Endoscopy;;    There were no vitals filed for this visit.  Subjective Assessment - 06/05/19 1521    Subjective  Pt reports 5/10 pain with ambulation, static standing off RLE is 0/10 pain. Pt reports continued pain in R hip during sleep, placed pillow between knees and that allowed her to sleep.    Limitations  Standing;Walking;House hold activities    How long can you sit comfortably?  no issues    How long can you stand  comfortably?  10 minutes    How long can you walk comfortably?  20-30 minutes    Diagnostic tests  x-ray    Patient Stated Goals  help me to the point that I can walk a distance again    Currently in Pain?  Yes    Pain Score  5     Pain Location  Hip    Pain Orientation  Right    Pain Descriptors / Indicators  Aching    Pain Type  Chronic pain    Pain Onset  More than a month ago    Pain Frequency  Intermittent    Aggravating Factors   certain movements    Pain Relieving Factors  exercise for back help, stretches, medication    Effect of Pain on Daily Activities  limits               [facility] Adult PT Treatment/Exercise - 06/05/19 0001      Lumbar Exercises: Seated   Sit to Stand  10 reps    Sit to Stand Limitations  BTB around thighs for abduction      Lumbar Exercises: Supine   Ab Set  20 reps;3 seconds    AB Set Limitations  with exhale    Bent Knee Raise  10 reps  Bent Knee Raise Limitations  with ab set    Bridge  15 reps    Other Supine Lumbar Exercises  bent knee fall out, hooklying position, with posterior pelvic tilt to engage core, x10 reps each leg      Manual Therapy   Manual Therapy  Soft tissue mobilization    Manual therapy comments  all manual completed seperate from rest of interventions    Soft tissue mobilization  stick and green weighted ball for STM to R hip musculature with recreation of pain, radiating to anterior hip/groin, reduced pain with decreased pressure             PT Education - 06/05/19 1523    Education Details  Exercise technique, continue HEP, IASTM with ball at home    Person(s) Educated  Patient    Methods  Explanation    Comprehension  Verbalized understanding       PT Short Term Goals - 05/22/19 1523      PT SHORT TERM GOAL #1   Title  Pt will perform HEP at least 2x/week to reduce pain and improve overall ability to complete ADLs.    Time  3    Period  Weeks    Status  On-going    Target Date  06/04/19       PT SHORT TERM GOAL #2   Title  Pt will demonstrate lumbar AROM WFL and pain-free to improve ability to complete self care tasks, household chores, and volunteering.    Time  3    Period  Weeks    Status  On-going        PT Long Term Goals - 05/22/19 1524      PT LONG TERM GOAL #1   Title  Pt will demonstrate RLE strength equal to LLE to improve gait mechanics, standing tolerance, and reduce pain with funcitonal activities.    Time  6    Period  Weeks    Status  On-going      PT LONG TERM GOAL #2   Title  Pt will perform 5x STS in 15 sec, no UE assist, equal BLE weight-bearing and no increased R hip pain.    Time  6    Period  Weeks    Status  On-going      PT LONG TERM GOAL #3   Title  Pt will self report low back, hip and knee pain 5/10 at worst with standing and ambulation to indicate improvement in ability to grocery shop and navigate community.    Time  6    Period  Weeks    Status  On-going      PT LONG TERM GOAL #4   Title  Pt will improve FOTO score to 25% limited to indicate improvement in overall functional abilities and return to personal exercise program.    Time  6    Period  Weeks    Status  On-going            Plan - 06/05/19 1613    Clinical Impression Statement  Pt with increased LBP with alternating bent knee raises in hooklying, improved with verbal cues to maintain low back pressed into the mat. Pt continues to assist RLE onto/off of mat table, limited in hip flexion strength and pain with that motion radiating into anterior thigh/groin. Add STS with BTB around thighs, pt with catch in R hip x3 limiting ability to fully stand. Ended with IASTM using stick and green weighted  ball to target R hip musculature and IT band with good results and reduction in pain at EOS. Continue to progress as able.    Personal Factors and Comorbidities  Past/Current Experience;Time since onset of injury/illness/exacerbation    Examination-Activity Limitations   Lift;Locomotion Level;Stand;Transfers    Examination-Participation Restrictions  Church;Cleaning;Meal Prep;Volunteer    Stability/Clinical Decision Making  Stable/Uncomplicated    Rehab Potential  Good    PT Frequency  2x / week    PT Duration  6 weeks    PT Treatment/Interventions  ADLs/Self Care Home Management;Aquatic Therapy;Biofeedback;Cryotherapy;Moist Heat;Traction;Ultrasound;DME Instruction;Gait training;Stair training;Functional mobility training;Therapeutic activities;Therapeutic exercise;Balance training;Neuromuscular re-education;Patient/family education;Orthotic Fit/Training;Manual techniques;Passive range of motion;Dry needling;Taping;Joint Manipulations    PT Next Visit Plan  Progress core strengthening and hip/knee strengthening. Manual as needed for pain relief. Long axis traction for RLE for improved R hip pain PRN. Continue monitoring anterior hip/groin pain with mobility.    PT Home Exercise Plan  Eval: posterior pelvic tilt; 1/26: prone hip flexor stretch with bedsheet; 2/9: IASTM with ball to R hip for pain relief    Consulted and Agree with Plan of Care  Patient       Patient will benefit from skilled therapeutic intervention in order to improve the following deficits and impairments:  Abnormal gait, Decreased activity tolerance, Decreased balance, Decreased endurance, Decreased range of motion, Decreased strength, Difficulty walking, Increased fascial restricitons, Increased muscle spasms, Impaired perceived functional ability, Impaired flexibility, Impaired vision/preception, Improper body mechanics, Postural dysfunction, Pain  Visit Diagnosis: Chronic bilateral low back pain without sciatica  Pain in right hip  Chronic pain of right knee  Difficulty in walking, not elsewhere classified     Problem List Patient Active Problem List   Diagnosis Date Noted  . Knee pain, right 04/06/2019  . Left ankle sprain 04/06/2019  . Personal history of colonic polyps  07/12/2018  . Ingrown right big toenail 03/23/2017  . Hip pain, chronic, right 02/03/2016  . GERD (gastroesophageal reflux disease) 12/26/2014  . Diverticulosis of colon without hemorrhage 07/05/2014  . FH: pancreatic cancer 01/26/2014  . FH: glaucoma 03/24/2013  . Overweight (BMI 25.0-29.9) 03/13/2012  . Hyperlipidemia LDL goal <100 04/03/2009  . Essential hypertension 04/03/2009     Talbot Grumbling PT, DPT 06/05/19, 4:15 PM [facility] [address] [place], [place], [address] Phone: [phone]   Fax:  [phone]  Name: Adrienne [name] MRN: [id number] Date of Birth: [birth year]

## 2019-06-07 ENCOUNTER — Ambulatory Visit (HOSPITAL_COMMUNITY): Payer: Medicare Other | Admitting: Physical Therapy

## 2019-06-07 ENCOUNTER — Other Ambulatory Visit: Payer: Self-pay

## 2019-06-07 DIAGNOSIS — M545 Low back pain: Secondary | ICD-10-CM | POA: Diagnosis not present

## 2019-06-07 DIAGNOSIS — G8929 Other chronic pain: Secondary | ICD-10-CM | POA: Diagnosis not present

## 2019-06-07 DIAGNOSIS — M25551 Pain in right hip: Secondary | ICD-10-CM

## 2019-06-07 DIAGNOSIS — M25561 Pain in right knee: Secondary | ICD-10-CM | POA: Diagnosis not present

## 2019-06-07 DIAGNOSIS — R262 Difficulty in walking, not elsewhere classified: Secondary | ICD-10-CM | POA: Diagnosis not present

## 2019-06-07 NOTE — Therapy (Signed)
[place], [place], [address] Phone: [phone]   Fax:  [phone]  Physical Therapy Treatment  [name] MRN: [id number] Date of Birth: 06-02-1945 Referring Provider (PT): Earlie Server, MD   Encounter Date: 06/07/2019  PT End of Session - 06/07/19 1624    Visit Number  8    Number of Visits  12    Date for PT Re-Evaluation  06/25/19    Authorization Type  Primary: Medicare, Secondary: [place] - Visit Number  8    Authorization - Number of Visits  10    PT Start Time  1532    PT Stop Time  [id number]    PT Time Calculation (min)  43 min    Activity Tolerance  Patient tolerated treatment well;No increased pain    Behavior During Therapy  WFL for tasks assessed/performed       Past Medical History:  Diagnosis Date  . Allergy   . Diverticulosis 05/2009  . Hyperlipidemia   . Hypertension     Past Surgical History:  Procedure Laterality Date  . ABDOMINAL HYSTERECTOMY  1990   for fibroids ovaries remain   . CHOLECYSTECTOMY  1980  . COLONOSCOPY  05/2009   rare diverticula, small internal hemorrhoids. Next colonoscopy in 5-10 yrs.  . COLONOSCOPY N/A 10/23/2018   Procedure: COLONOSCOPY;  Surgeon: Danie Binder, MD;  [facility];  Service: Endoscopy;  Laterality: N/A;  2:00pm  . left nipple inversion bx benign  2000  . POLYPECTOMY  10/23/2018   Procedure: POLYPECTOMY;  Surgeon: Danie Binder, MD;  [facility];  Service: Endoscopy;;    There were no vitals filed for this visit.                    [facility] Adult PT Treatment/Exercise - 06/07/19 0001      Lumbar Exercises: Seated   Sit to Stand  10 reps    Sit to Stand Limitations  BTB around thighs for abduction      Lumbar Exercises: Supine   Ab Set  20 reps;3 seconds    AB Set Limitations  with exhale    Clam  10 reps    Clam Limitations  with BTB around thighs    Bent  Knee Raise  10 reps    Bent Knee Raise Limitations  with ab set    Bridge  15 reps      Lumbar Exercises: Sidelying   Clam Limitations  Rt 10 reps      Manual Therapy   Manual Therapy  Soft tissue mobilization    Manual therapy comments  all manual completed seperate from rest of interventions    Soft tissue mobilization  stick and green weighted ball for STM to R hip musculature with recreation of pain, radiating to anterior hip/groin, reduced pain with decreased pressure               PT Short Term Goals - 05/22/19 1523      PT SHORT TERM GOAL #1   Title  Pt will perform HEP at least 2x/week to reduce pain and improve overall ability to complete ADLs.    Time  3    Period  Weeks    Status  On-going    Target Date  06/04/19      PT SHORT TERM GOAL #2   Title  Pt will demonstrate lumbar  AROM WFL and pain-free to improve ability to complete self care tasks, household chores, and volunteering.    Time  3    Period  Weeks    Status  On-going        PT Long Term Goals - 05/22/19 1524      PT LONG TERM GOAL #1   Title  Pt will demonstrate RLE strength equal to LLE to improve gait mechanics, standing tolerance, and reduce pain with funcitonal activities.    Time  6    Period  Weeks    Status  On-going      PT LONG TERM GOAL #2   Title  Pt will perform 5x STS in 15 sec, no UE assist, equal BLE weight-bearing and no increased R hip pain.    Time  6    Period  Weeks    Status  On-going      PT LONG TERM GOAL #3   Title  Pt will self report low back, hip and knee pain 5/10 at worst with standing and ambulation to indicate improvement in ability to grocery shop and navigate community.    Time  6    Period  Weeks    Status  On-going      PT LONG TERM GOAL #4   Title  Pt will improve FOTO score to 25% limited to indicate improvement in overall functional abilities and return to personal exercise program.    Time  6    Period  Weeks    Status  On-going             Plan - 06/07/19 1625    Clinical Impression Statement  Noted improvement this session both subjectively and objectively.  Pt walking with less antalgia and moving without UE assist needed for LE's.  Pt able to complete Rt sidelying clam today without pain with good ROM so added blue theraband to supine clam activity.  Contineud with manual using weighted ball to target Rt hip musculature and ITB.  Pt without significant pain reactions and continued to present with low symptoms and antalgia at end of sesison.  Ovearll improving.    Personal Factors and Comorbidities  Past/Current Experience;Time since onset of injury/illness/exacerbation    Examination-Activity Limitations  Lift;Locomotion Level;Stand;Transfers    Examination-Participation Restrictions  Church;Cleaning;Meal Prep;Volunteer    Stability/Clinical Decision Making  Stable/Uncomplicated    Rehab Potential  Good    PT Frequency  2x / week    PT Duration  6 weeks    PT Treatment/Interventions  ADLs/Self Care Home Management;Aquatic Therapy;Biofeedback;Cryotherapy;Moist Heat;Traction;Ultrasound;DME Instruction;Gait training;Stair training;Functional mobility training;Therapeutic activities;Therapeutic exercise;Balance training;Neuromuscular re-education;Patient/family education;Orthotic Fit/Training;Manual techniques;Passive range of motion;Dry needling;Taping;Joint Manipulations    PT Next Visit Plan  Progress core strengthening and hip/knee strengthening. Manual as needed for pain relief. Long axis traction for RLE for improved R hip pain PRN. Continue monitoring anterior hip/groin pain with mobility.    PT Home Exercise Plan  Eval: posterior pelvic tilt; 1/26: prone hip flexor stretch with bedsheet; 2/9: IASTM with ball to R hip for pain relief    Consulted and Agree with Plan of Care  Patient       Patient will benefit from skilled therapeutic intervention in order to improve the following deficits and impairments:   Abnormal gait, Decreased activity tolerance, Decreased balance, Decreased endurance, Decreased range of motion, Decreased strength, Difficulty walking, Increased fascial restricitons, Increased muscle spasms, Impaired perceived functional ability, Impaired flexibility, Impaired vision/preception, Improper body mechanics, Postural dysfunction, Pain  Visit Diagnosis: Pain in right hip  Chronic pain of right knee  Chronic bilateral low back pain without sciatica     Problem List Patient Active Problem List   Diagnosis Date Noted  . Knee pain, right 04/06/2019  . Left ankle sprain 04/06/2019  . Personal history of colonic polyps 07/12/2018  . Ingrown right big toenail 03/23/2017  . Hip pain, chronic, right 02/03/2016  . GERD (gastroesophageal reflux disease) 12/26/2014  . Diverticulosis of colon without hemorrhage 07/05/2014  . FH: pancreatic cancer 01/26/2014  . FH: glaucoma 03/24/2013  . Overweight (BMI 25.0-29.9) 03/13/2012  . Hyperlipidemia LDL goal <100 04/03/2009  . Essential hypertension 04/03/2009   Teena Irani, PTA/CLT [phone]  Teena Irani 06/07/2019, 4:29 PM  [place] [address] [place], [place], [address] Phone: [phone]   Fax:  [phone]  Name: Adrienne [name] MRN: [id number] Date of Birth: 1945/11/21

## 2019-06-12 ENCOUNTER — Ambulatory Visit (HOSPITAL_COMMUNITY): Payer: Medicare Other

## 2019-06-12 ENCOUNTER — Other Ambulatory Visit: Payer: Self-pay

## 2019-06-12 ENCOUNTER — Encounter (HOSPITAL_COMMUNITY): Payer: Self-pay

## 2019-06-12 DIAGNOSIS — M25551 Pain in right hip: Secondary | ICD-10-CM

## 2019-06-12 DIAGNOSIS — R262 Difficulty in walking, not elsewhere classified: Secondary | ICD-10-CM | POA: Diagnosis not present

## 2019-06-12 DIAGNOSIS — M25561 Pain in right knee: Secondary | ICD-10-CM

## 2019-06-12 DIAGNOSIS — G8929 Other chronic pain: Secondary | ICD-10-CM | POA: Diagnosis not present

## 2019-06-12 DIAGNOSIS — M545 Low back pain: Secondary | ICD-10-CM | POA: Diagnosis not present

## 2019-06-12 NOTE — Therapy (Signed)
[place], [place], [address] Phone: [phone]   Fax:  [phone]  Physical Therapy Treatment  [name] MRN: [id number] Date of Birth: [birth year] Referring Provider (PT): Earlie Server, MD   Encounter Date: 06/12/2019  PT End of Session - 06/12/19 1440    Visit Number  9    Number of Visits  12    Date for PT Re-Evaluation  06/25/19    Authorization Type  Primary: Medicare, Secondary: [employer]    Authorization - Visit Number  9    Authorization - Number of Visits  10    PT Start Time  1435    PT Stop Time  1515    PT Time Calculation (min)  40 min    Activity Tolerance  Patient tolerated treatment well;Patient limited by pain    Behavior During Therapy  [facility] for tasks assessed/performed       Past Medical History:  Diagnosis Date  . Allergy   . Diverticulosis 05/2009  . Hyperlipidemia   . Hypertension     Past Surgical History:  Procedure Laterality Date  . ABDOMINAL HYSTERECTOMY  1990   for fibroids ovaries remain   . CHOLECYSTECTOMY  1980  . COLONOSCOPY  05/2009   rare diverticula, small internal hemorrhoids. Next colonoscopy in 5-10 yrs.  . COLONOSCOPY N/A 10/23/2018   Procedure: COLONOSCOPY;  Surgeon: Danie Binder, MD;  [facility];  Service: Endoscopy;  Laterality: N/A;  2:00pm  . left nipple inversion bx benign  2000  . POLYPECTOMY  10/23/2018   Procedure: POLYPECTOMY;  Surgeon: Danie Binder, MD;  [facility];  Service: Endoscopy;;    There were no vitals filed for this visit.  Subjective Assessment - 06/12/19 1436    Subjective  Pt reports pain today 3/10, but took pain medication prior to therapy just to [facility] low pain. Pt reports yesterday and Sunday the pain was "crippling" with decreased ability the walk due to pain in all 3 R knee, hip and back.    Limitations  Standing;Walking;House hold activities    How long can you sit  comfortably?  no issues    How long can you stand comfortably?  10 minutes    How long can you walk comfortably?  20-30 minutes    Diagnostic tests  x-ray    Patient Stated Goals  help me to the point that I can walk a distance again    Currently in Pain?  Yes    Pain Score  3     Pain Location  Hip    Pain Orientation  Right    Pain Descriptors / Indicators  Aching    Pain Type  Chronic pain    Pain Onset  More than a month ago    Pain Frequency  Intermittent    Aggravating Factors   certain movements    Pain Relieving Factors  exercise for back help, stretches, medication    Effect of Pain on Daily Activities  limits              [facility] Adult PT Treatment/Exercise - 06/12/19 0001      Lumbar Exercises: Standing   Other Standing Lumbar Exercises  hip flexion/excursion, lateral, rotation, x15 reps each direction    Other Standing Lumbar Exercises  hip abduction, x5 reps BLE      Lumbar Exercises: Supine   Pelvic Tilt  20 reps;5 seconds  Pelvic Tilt Limitations  posterior pelvic tilt    Clam  15 reps    Clam Limitations  with BTB around thighs    Bent Knee Raise  10 reps    Bent Knee Raise Limitations  with ab set    Dead Bug  5 reps;5 seconds    Dead Bug Limitations  cues for posterior pelvic tilt    Bridge  15 reps    Other Supine Lumbar Exercises  alternating LE slide out, hooklying position, with posterior pelvic tilt to engage core, x10 reps each leg             PT Education - 06/12/19 1523    Education Details  Continue HEP, exercise technique, aquatic exercise    Person(s) Educated  Patient    Methods  Explanation    Comprehension  Verbalized understanding       PT Short Term Goals - 05/22/19 1523      PT SHORT TERM GOAL #1   Title  Pt will perform HEP at least 2x/week to reduce pain and improve overall ability to complete ADLs.    Time  3    Period  Weeks    Status  On-going    Target Date  06/04/19      PT SHORT TERM GOAL #2   Title  Pt  will demonstrate lumbar AROM WFL and pain-free to improve ability to complete self care tasks, household chores, and volunteering.    Time  3    Period  Weeks    Status  On-going        PT Long Term Goals - 05/22/19 1524      PT LONG TERM GOAL #1   Title  Pt will demonstrate RLE strength equal to LLE to improve gait mechanics, standing tolerance, and reduce pain with funcitonal activities.    Time  6    Period  Weeks    Status  On-going      PT LONG TERM GOAL #2   Title  Pt will perform 5x STS in 15 sec, no UE assist, equal BLE weight-bearing and no increased R hip pain.    Time  6    Period  Weeks    Status  On-going      PT LONG TERM GOAL #3   Title  Pt will self report low back, hip and knee pain 5/10 at worst with standing and ambulation to indicate improvement in ability to grocery shop and navigate community.    Time  6    Period  Weeks    Status  On-going      PT LONG TERM GOAL #4   Title  Pt will improve FOTO score to 25% limited to indicate improvement in overall functional abilities and return to personal exercise program.    Time  6    Period  Weeks    Status  On-going            Plan - 06/12/19 1523    Clinical Impression Statement  Pt with good ab contraction with supine exercises. Added "dead bug" with isometric hold at 90/90 position for 5 sec and pt demonstrates fatiguing. Increased LBP with exercise improved with cues to maintain low back pressed into mat to avoid hyperextension. Attempted standing hip abduction, but increased pain in R hip with L LE abduction so discontinued. Pt continues to assist RLE onto mat with BUE due to decreased hip flexion strength. Educated pt on aquatic therapy, benefits, and  progressing to standing core work as tolerable for more functional exercise and pt agree. Pt continues to have slight increase in pain at EOS limiting progression. Continue to progress as able.    Personal Factors and Comorbidities  Past/Current  Experience;Time since onset of injury/illness/exacerbation    Examination-Activity Limitations  Lift;Locomotion Level;Stand;Transfers    Examination-Participation Restrictions  Church;Cleaning;Meal Prep;Volunteer    Stability/Clinical Decision Making  Stable/Uncomplicated    Rehab Potential  Good    PT Frequency  2x / week    PT Duration  6 weeks    PT Treatment/Interventions  ADLs/Self Care Home Management;Aquatic Therapy;Biofeedback;Cryotherapy;Moist Heat;Traction;Ultrasound;DME Instruction;Gait training;Stair training;Functional mobility training;Therapeutic activities;Therapeutic exercise;Balance training;Neuromuscular re-education;Patient/family education;Orthotic Fit/Training;Manual techniques;Passive range of motion;Dry needling;Taping;Joint Manipulations    PT Next Visit Plan  Progress core strengthening and hip/knee strengthening; progress to standing strengthening. Manual as needed for pain relief. Long axis traction for RLE for improved R hip pain PRN. Continue monitoring anterior hip/groin pain with mobility. Consider aquatic exercise.    PT Home Exercise Plan  Eval: posterior pelvic tilt; 1/26: prone hip flexor stretch with bedsheet; 2/9: IASTM with ball to R hip for pain relief    Consulted and Agree with Plan of Care  Patient       Patient will benefit from skilled therapeutic intervention in order to improve the following deficits and impairments:  Abnormal gait, Decreased activity tolerance, Decreased balance, Decreased endurance, Decreased range of motion, Decreased strength, Difficulty walking, Increased fascial restricitons, Increased muscle spasms, Impaired perceived functional ability, Impaired flexibility, Impaired vision/preception, Improper body mechanics, Postural dysfunction, Pain  Visit Diagnosis: Chronic bilateral low back pain without sciatica  Pain in right hip  Chronic pain of right knee  Difficulty in walking, not elsewhere classified     Problem  List Patient Active Problem List   Diagnosis Date Noted  . Knee pain, right 04/06/2019  . Left ankle sprain 04/06/2019  . Personal history of colonic polyps 07/12/2018  . Ingrown right big toenail 03/23/2017  . Hip pain, chronic, right 02/03/2016  . GERD (gastroesophageal reflux disease) 12/26/2014  . Diverticulosis of colon without hemorrhage 07/05/2014  . FH: pancreatic cancer 01/26/2014  . FH: glaucoma 03/24/2013  . Overweight (BMI 25.0-29.9) 03/13/2012  . Hyperlipidemia LDL goal <100 04/03/2009  . Essential hypertension 04/03/2009     Talbot Grumbling PT, DPT 06/12/19, 3:25 PM [place] [address] [place], [place], [address] Phone: [phone]   Fax:  [phone]  Name: Trishna Karrick MRN: [id number] Date of Birth: [birth year]

## 2019-06-13 ENCOUNTER — Ambulatory Visit: Payer: Medicare Other | Attending: Internal Medicine

## 2019-06-13 DIAGNOSIS — Z23 Encounter for immunization: Secondary | ICD-10-CM

## 2019-06-13 NOTE — Progress Notes (Signed)
   Covid-19 Vaccination Clinic  Name:  Adrienne [name]    MRN: [id number] DOB: 1945/10/12  06/13/2019  [name] was observed post Covid-19 immunization for 15 minutes without incidence. She was provided with Vaccine Information Sheet and instruction to access the V-Safe system.   [name] was instructed to call 911 with any severe reactions post vaccine: Marland Kitchen Difficulty breathing  . Swelling of your face and throat  . A fast heartbeat  . A bad rash all over your body  . Dizziness and weakness    Immunizations Administered    Name Date Dose VIS Date Route   Moderna COVID-19 Vaccine 06/13/2019 11:16 AM 0.5 mL 03/27/2019 Intramuscular   Manufacturer: Moderna   Lot: [id number]   [id number]

## 2019-06-14 ENCOUNTER — Ambulatory Visit (HOSPITAL_COMMUNITY): Payer: Medicare Other | Admitting: Physical Therapy

## 2019-06-19 ENCOUNTER — Other Ambulatory Visit: Payer: Self-pay

## 2019-06-19 ENCOUNTER — Encounter (HOSPITAL_COMMUNITY): Payer: Self-pay

## 2019-06-19 ENCOUNTER — Ambulatory Visit (HOSPITAL_COMMUNITY): Payer: Medicare Other

## 2019-06-19 DIAGNOSIS — M25561 Pain in right knee: Secondary | ICD-10-CM

## 2019-06-19 DIAGNOSIS — M25551 Pain in right hip: Secondary | ICD-10-CM

## 2019-06-19 DIAGNOSIS — R262 Difficulty in walking, not elsewhere classified: Secondary | ICD-10-CM

## 2019-06-19 DIAGNOSIS — G8929 Other chronic pain: Secondary | ICD-10-CM

## 2019-06-19 DIAGNOSIS — M545 Low back pain: Secondary | ICD-10-CM | POA: Diagnosis not present

## 2019-06-19 NOTE — Therapy (Signed)
[facility] [place], [place], [address] Phone: [phone]   Fax:  [phone]   Progress Note Reporting Period 05/14/19  to 06/19/19  See note below for Objective Data and Assessment of Progress/Goals.       Physical Therapy Treatment  [name] MRN: [id number] Date of Birth: February 23, 1946 Referring Provider (PT): Earlie Server, MD   Encounter Date: 06/19/2019  PT End of Session - 06/19/19 1446    Visit Number  10    Number of Visits  12    Date for PT Re-Evaluation  06/25/19    Authorization Type  Primary: Medicare, Secondary: BCBS    Authorization - Visit Number  1    Authorization - Number of Visits  10    PT Start Time  [id number]    PT Stop Time  1515    PT Time Calculation (min)  44 min    Activity Tolerance  Patient tolerated treatment well;Patient limited by pain    Behavior During Therapy  [facility] for tasks assessed/performed       Past Medical History:  Diagnosis Date  . Allergy   . Diverticulosis 05/2009  . Hyperlipidemia   . Hypertension     Past Surgical History:  Procedure Laterality Date  . ABDOMINAL HYSTERECTOMY  1990   for fibroids ovaries remain   . CHOLECYSTECTOMY  1980  . COLONOSCOPY  05/2009   rare diverticula, small internal hemorrhoids. Next colonoscopy in 5-10 yrs.  . COLONOSCOPY N/A 10/23/2018   Procedure: COLONOSCOPY;  Surgeon: Danie Binder, MD;  [facility];  Service: Endoscopy;  Laterality: N/A;  2:00pm  . left nipple inversion bx benign  2000  . POLYPECTOMY  10/23/2018   Procedure: POLYPECTOMY;  Surgeon: Danie Binder, MD;  Location: [facility];  Service: Endoscopy;;    There were no vitals filed for this visit.  Subjective Assessment - 06/19/19 1434    Subjective  Pt reports she thinks she does better in therapy versus at home because she is premedicating before therapy for pain control. Pt reports noticing RLE able to lift leg better in  sitting and standing, but still difficult and painful in supine. Pt reports increased ease with enterring/exiting vehicle. Pt reports still has to take medicine, wonders if the weather is causing flare ups, and feels like she continues to fatigue with daily activities. Pt reports began caring for brother ~4-6 weeks ago assisting him with medication management, mail management and eating meals/clean up afterwards. Pt reports noticing stabbing sensation in low back 2x in last week. Pt reports after bending forward to grabbing boots she has to move around before she is able to straighten back and stand up.    Limitations  Standing;Walking;House hold activities    How long can you sit comfortably?  no issues    How long can you stand comfortably?  10 minutes    How long can you walk comfortably?  20-30 minutes    Diagnostic tests  x-ray    Patient Stated Goals  help me to the point that I can walk a distance again    Currently in Pain?  Yes    Pain Score  7    7-walking, 3-sitting   Pain Location  Knee    Pain Orientation  Right    Pain Descriptors / Indicators  Aching;Throbbing    Pain Type  Chronic pain    Pain Onset  More than a  month ago    Pain Frequency  Intermittent    Aggravating Factors   certain movements    Pain Relieving Factors  exercise for back help, stretches, medication    Effect of Pain on Daily Activities  limits         [facility] PT Assessment - 06/19/19 0001      Assessment   Medical Diagnosis  R knee/hip pain, LBP    Referring Provider (PT)  Earlie Server, MD    Onset Date/Surgical Date  --   10-11 years, worsening since November 2020   Next MD Visit  none scheduled    Prior Therapy  yes, HEP for LBP      Precautions   Precautions  None      Restrictions   Weight Bearing Restrictions  No      Balance Screen   Has the patient fallen in the past 6 months  No    Has the patient had a decrease in activity level because of a fear of falling?   No    Is the patient  reluctant to leave their home because of a fear of falling?   No      Cognition   Overall Cognitive Status  Within Functional Limits for tasks assessed      Observation/Other Assessments   Observations  Static standing with weight shifted onto LLE    Scoliosis  increased lumbar lordosis in static standing    Focus on Therapeutic Outcomes (FOTO)   47% limited   was 50% limited     Sensation   Light Touch  Appears Intact      Functional Tests   Functional tests  Sit to Stand      Sit to Stand   Comments  5x STS: 18 sec, weight-shifted to LLE, increased R hip pain   was 17.6 sec, R hip pain, weight-shifted to LLE     ROM / Strength   AROM / PROM / Strength  AROM;Strength      AROM   AROM Assessment Site  Lumbar    Lumbar Flexion  hands to floor, LBP with return to stand   was hands to floor, pain R hip with return to stand   Lumbar Extension  50% limited, pain in R hip flexor   was 50% limited, pain in R hip   Lumbar - Right Side Bend  jt line, pain in R hip   was jt line, pain in R hip   Lumbar - Left Side Bend  2 inches above jt line, pain in R hip   was 2 inches above jt line, pain in R hip   Lumbar - Right Rotation  50% limited   was 50% limited, pain in R hip   Lumbar - Left Rotation  50% limited   was 50% limited, pain in R hip     Strength   Right Hip Flexion  3+/5   was 3+   Right Hip Extension  3+/5   was 3+   Right Hip ABduction  3/5   was 3+   Left Hip Flexion  4+/5   was 4+   Left Hip Extension  3+/5   was 3+   Left Hip ABduction  4+/5   was 4+   Right Knee Flexion  4+/5   was 4+   Right Knee Extension  5/5   was 5   Left Knee Flexion  4+/5   was 4+   Left Knee Extension  5/5   was 5   Right Ankle Dorsiflexion  5/5   was 5   Left Ankle Dorsiflexion  5/5   was 5     Palpation   Spinal mobility  denies TTP with gentle PAs to lumbar vertebrae   was no pain with gentle PAs to lumbar vertebrae   SI assessment   denies TTP SIJ bil    was R SIJ  pain with pain recreation   Palpation comment  Point tenderness and recreation of pain symptoms along R lumbar paraspinals, denies pain throughout R glute and lateral hip musculature   was R hip mm tenderness      Ambulation/Gait   Gait Comments  pt continues to ambulate with limp, weight-shifted to LLE and decreased weight-shift on RLE            PT Education - 06/19/19 1445    Education Details  Educated pt on building muscle, dry needling, aquatic therapy, aging process.    Person(s) Educated  Patient    Methods  Explanation    Comprehension  Verbalized understanding       PT Short Term Goals - 06/19/19 1447      PT SHORT TERM GOAL #1   Title  Pt will perform HEP at least 2x/week to reduce pain and improve overall ability to complete ADLs.    Baseline  2/23: pt reports performing HEP each morning    Time  3    Period  Weeks    Status  Achieved    Target Date  06/04/19      PT SHORT TERM GOAL #2   Title  Pt will demonstrate lumbar AROM WFL and pain-free to improve ability to complete self care tasks, household chores, and volunteering.    Baseline  2/23: see AROM    Time  3    Period  Weeks    Status  On-going        PT Long Term Goals - 06/19/19 1449      PT LONG TERM GOAL #1   Title  Pt will demonstrate RLE strength equal to LLE to improve gait mechanics, standing tolerance, and reduce pain with funcitonal activities.    Baseline  2/23: see MMT    Time  6    Period  Weeks    Status  On-going      PT LONG TERM GOAL #2   Title  Pt will perform 5x STS in 15 sec, no UE assist, equal BLE weight-bearing and no increased R hip pain.    Baseline  2/23: 18 sec, weight-shifted to LLE, increased R hip pain    Time  6    Period  Weeks    Status  On-going      PT LONG TERM GOAL #3   Title  Pt will self report low back, hip and knee pain 5/10 at worst with standing and ambulation to indicate improvement in ability to grocery shop and navigate community.    Baseline   2/23: 8/10 at worst    Time  6    Period  Weeks    Status  On-going      PT LONG TERM GOAL #4   Title  Pt will improve FOTO score to 25% limited to indicate improvement in overall functional abilities and return to personal exercise program.    Baseline  2/23: 47% limited    Time  6    Period  Weeks    Status  On-going  Plan - 06/19/19 1601    Clinical Impression Statement  Pt due for reassessment this session. Pt subjectively with minimal improvements with functional activity since starting therapy and continues to have pain located in R low back, R hip and R knee. Pt without significant improvements in 5x STS, lumbar AROM, static standing posture, gait pattern or R hip strength since beginning therapy. Pt does demonstrate improved core strength per ability to perform increased sets and reps of exercises without increasing pain. Educated pt on weather changes causing pressure changes in joints which can increase pain. Educated pt on muscle building and duration needed to see functional gains. Significant education on dry needling, benefits, techniques, purpose in regarding to alleviating pain and improving functional mobility, as well as education on aquatic therapy, benefits of water in decreasing weight, pressure, and aiding in supported movements in the water. Pt in agreement to add both into treatment plan moving forward. Pt limited by pain and inclement weather and not enough time to demonstrate gains at this time. Pt would continue to benefit from skilled PT to progress HEP, add dry needling and aquatics to therapy and continue addressing pain limitations.    Personal Factors and Comorbidities  Past/Current Experience;Time since onset of injury/illness/exacerbation    Examination-Activity Limitations  Lift;Locomotion Level;Stand;Transfers    Examination-Participation Restrictions  Church;Cleaning;Meal Prep;Volunteer    Stability/Clinical Decision Making  Stable/Uncomplicated     Rehab Potential  Good    PT Frequency  2x / week    PT Duration  6 weeks    PT Treatment/Interventions  ADLs/Self Care Home Management;Aquatic Therapy;Biofeedback;Cryotherapy;Moist Heat;Traction;Ultrasound;DME Instruction;Gait training;Stair training;Functional mobility training;Therapeutic activities;Therapeutic exercise;Balance training;Neuromuscular re-education;Patient/family education;Orthotic Fit/Training;Manual techniques;Passive range of motion;Dry needling;Taping;Joint Manipulations    PT Next Visit Plan  Add dry needling and aquatic therapy to treatments. Progress core strengthening and hip/knee strengthening; progress to standing strengthening. Manual as needed for pain relief. Long axis traction for RLE for improved R hip pain PRN. Continue monitoring anterior hip/groin pain with mobility.    PT Home Exercise Plan  Eval: posterior pelvic tilt; 1/26: prone hip flexor stretch with bedsheet; 2/9: IASTM with ball to R hip for pain relief    Consulted and Agree with Plan of Care  Patient       Patient will benefit from skilled therapeutic intervention in order to improve the following deficits and impairments:  Abnormal gait, Decreased activity tolerance, Decreased balance, Decreased endurance, Decreased range of motion, Decreased strength, Difficulty walking, Increased fascial restricitons, Increased muscle spasms, Impaired perceived functional ability, Impaired flexibility, Impaired vision/preception, Improper body mechanics, Postural dysfunction, Pain  Visit Diagnosis: Chronic bilateral low back pain without sciatica  Pain in right hip  Chronic pain of right knee  Difficulty in walking, not elsewhere classified     Problem List Patient Active Problem List   Diagnosis Date Noted  . Knee pain, right 04/06/2019  . Left ankle sprain 04/06/2019  . Personal history of colonic polyps 07/12/2018  . Ingrown right big toenail 03/23/2017  . Hip pain, chronic, right 02/03/2016  .  GERD (gastroesophageal reflux disease) 12/26/2014  . Diverticulosis of colon without hemorrhage 07/05/2014  . FH: pancreatic cancer 01/26/2014  . FH: glaucoma 03/24/2013  . Overweight (BMI 25.0-29.9) 03/13/2012  . Hyperlipidemia LDL goal <100 04/03/2009  . Essential hypertension 04/03/2009     Talbot Grumbling PT, DPT 06/19/19, 4:05 PM [facility] [address] [place], [place], [address] Phone: [phone]   Fax:  [phone]  Name:  Lucine Hilfiker MRN: [id number] Date of Birth: [birth year]

## 2019-06-21 ENCOUNTER — Ambulatory Visit (HOSPITAL_COMMUNITY): Payer: Medicare Other | Admitting: Physical Therapy

## 2019-06-21 ENCOUNTER — Other Ambulatory Visit: Payer: Self-pay

## 2019-06-21 DIAGNOSIS — M25561 Pain in right knee: Secondary | ICD-10-CM | POA: Diagnosis not present

## 2019-06-21 DIAGNOSIS — M545 Low back pain, unspecified: Secondary | ICD-10-CM

## 2019-06-21 DIAGNOSIS — M25551 Pain in right hip: Secondary | ICD-10-CM

## 2019-06-21 DIAGNOSIS — G8929 Other chronic pain: Secondary | ICD-10-CM

## 2019-06-21 DIAGNOSIS — R262 Difficulty in walking, not elsewhere classified: Secondary | ICD-10-CM

## 2019-06-21 NOTE — Therapy (Signed)
[facility] [place], [place], [address] Phone: [phone]   Fax:  [phone]  Physical Therapy Treatment  [name] MRN: [id number] Date of Birth: 1946/03/27 Referring Provider (PT): Earlie Server, MD   Encounter Date: 06/21/2019  PT End of Session - 06/21/19 1605    Visit Number  11    Number of Visits  12    Date for PT Re-Evaluation  06/25/19    Authorization Type  Primary: Medicare, Secondary: BCBS    Authorization - Visit Number  2    Authorization - Number of Visits  10    PT Start Time  1450    PT Stop Time  1535    PT Time Calculation (min)  45 min    Activity Tolerance  Patient tolerated treatment well;Patient limited by pain    Behavior During Therapy  [facility] for tasks assessed/performed       Past Medical History:  Diagnosis Date  . Allergy   . Diverticulosis 05/2009  . Hyperlipidemia   . Hypertension     Past Surgical History:  Procedure Laterality Date  . ABDOMINAL HYSTERECTOMY  1990   for fibroids ovaries remain   . CHOLECYSTECTOMY  1980  . COLONOSCOPY  05/2009   rare diverticula, small internal hemorrhoids. Next colonoscopy in 5-10 yrs.  . COLONOSCOPY N/A 10/23/2018   Procedure: COLONOSCOPY;  Surgeon: Danie Binder, MD;  [facility];  Service: Endoscopy;  Laterality: N/A;  2:00pm  . left nipple inversion bx benign  2000  . POLYPECTOMY  10/23/2018   Procedure: POLYPECTOMY;  Surgeon: Danie Binder, MD;  [facility];  Service: Endoscopy;;    There were no vitals filed for this visit.  Subjective Assessment - 06/21/19 1455    Subjective  pt states she's taken "2 steps back since I seen you last".  States after last session she has not done well, Rt LE gave way one day and having pain.  States she is medicated today.    Currently in Pain?  Yes    Pain Score  7     Pain Location  Hip    Pain Orientation  Right    Aggravating Factors   hurts  worse with ambulation                       [facility] Adult PT Treatment/Exercise - 06/21/19 0001      Lumbar Exercises: Stretches   Hip Flexor Stretch  Right;Left;3 reps;30 seconds    Hip Flexor Stretch Limitations  standing with 12" step      Lumbar Exercises: Standing   Other Standing Lumbar Exercises  hip excursions 10 reps each    Other Standing Lumbar Exercises  hip abd and extension 2X10 each      Lumbar Exercises: Supine   Pelvic Tilt  20 reps;5 seconds    Pelvic Tilt Limitations  posterior pelvic tilt    Clam  15 reps    Bridge  15 reps      Manual Therapy   Manual Therapy  Manual Traction    Manual therapy comments  all manual completed seperate from rest of interventions    Manual Traction  long axis traction to RLE to reduce pain in R hip joint, static hold, 30 sec increments with good results               PT Short Term Goals -  06/19/19 1447      PT SHORT TERM GOAL #1   Title  Pt will perform HEP at least 2x/week to reduce pain and improve overall ability to complete ADLs.    Baseline  2/23: pt reports performing HEP each morning    Time  3    Period  Weeks    Status  Achieved    Target Date  06/04/19      PT SHORT TERM GOAL #2   Title  Pt will demonstrate lumbar AROM WFL and pain-free to improve ability to complete self care tasks, household chores, and volunteering.    Baseline  2/23: see AROM    Time  3    Period  Weeks    Status  On-going        PT Long Term Goals - 06/19/19 1449      PT LONG TERM GOAL #1   Title  Pt will demonstrate RLE strength equal to LLE to improve gait mechanics, standing tolerance, and reduce pain with funcitonal activities.    Baseline  2/23: see MMT    Time  6    Period  Weeks    Status  On-going      PT LONG TERM GOAL #2   Title  Pt will perform 5x STS in 15 sec, no UE assist, equal BLE weight-bearing and no increased R hip pain.    Baseline  2/23: 18 sec, weight-shifted to LLE, increased R hip  pain    Time  6    Period  Weeks    Status  On-going      PT LONG TERM GOAL #3   Title  Pt will self report low back, hip and knee pain 5/10 at worst with standing and ambulation to indicate improvement in ability to grocery shop and navigate community.    Baseline  2/23: 8/10 at worst    Time  6    Period  Weeks    Status  On-going      PT LONG TERM GOAL #4   Title  Pt will improve FOTO score to 25% limited to indicate improvement in overall functional abilities and return to personal exercise program.    Baseline  2/23: 47% limited    Time  6    Period  Weeks    Status  On-going            Plan - 06/21/19 1606    Clinical Impression Statement  pt retuns today reporting rough couple days, increased pain especially in Rt hip/groin.  Added standing hip flexor stretch with good results and continued with all established therex without complaints.  Traction completed at end of session to Rt LE.  Pt reported significant improvement at end of session.    Personal Factors and Comorbidities  Past/Current Experience;Time since onset of injury/illness/exacerbation    Examination-Activity Limitations  Lift;Locomotion Level;Stand;Transfers    Examination-Participation Restrictions  Church;Cleaning;Meal Prep;Volunteer    Stability/Clinical Decision Making  Stable/Uncomplicated    Rehab Potential  Good    PT Frequency  2x / week    PT Duration  6 weeks    PT Treatment/Interventions  ADLs/Self Care Home Management;Aquatic Therapy;Biofeedback;Cryotherapy;Moist Heat;Traction;Ultrasound;DME Instruction;Gait training;Stair training;Functional mobility training;Therapeutic activities;Therapeutic exercise;Balance training;Neuromuscular re-education;Patient/family education;Orthotic Fit/Training;Manual techniques;Passive range of motion;Dry needling;Taping;Joint Manipulations    PT Next Visit Plan  Add dry needling and aquatic therapy to treatments. Progress core strengthening and hip/knee  strengthening; progress to standing strengthening. Manual as needed for pain relief. Long axis traction  for RLE for improved R hip pain PRN. Continue monitoring anterior hip/groin pain with mobility.    PT Home Exercise Plan  Eval: posterior pelvic tilt; 1/26: prone hip flexor stretch with bedsheet; 2/9: IASTM with ball to R hip for pain relief    Consulted and Agree with Plan of Care  Patient       Patient will benefit from skilled therapeutic intervention in order to improve the following deficits and impairments:  Abnormal gait, Decreased activity tolerance, Decreased balance, Decreased endurance, Decreased range of motion, Decreased strength, Difficulty walking, Increased fascial restricitons, Increased muscle spasms, Impaired perceived functional ability, Impaired flexibility, Impaired vision/preception, Improper body mechanics, Postural dysfunction, Pain  Visit Diagnosis: Pain in right hip  Chronic pain of right knee  Difficulty in walking, not elsewhere classified  Chronic bilateral low back pain without sciatica     Problem List Patient Active Problem List   Diagnosis Date Noted  . Knee pain, right 04/06/2019  . Left ankle sprain 04/06/2019  . Personal history of colonic polyps 07/12/2018  . Ingrown right big toenail 03/23/2017  . Hip pain, chronic, right 02/03/2016  . GERD (gastroesophageal reflux disease) 12/26/2014  . Diverticulosis of colon without hemorrhage 07/05/2014  . FH: pancreatic cancer 01/26/2014  . FH: glaucoma 03/24/2013  . Overweight (BMI 25.0-29.9) 03/13/2012  . Hyperlipidemia LDL goal <100 04/03/2009  . Essential hypertension 04/03/2009   Teena Irani, PTA/CLT [phone]  Teena Irani 06/21/2019, 4:08 PM  [facility] [address] [place], [place], [address] Phone: [phone]   Fax:  [phone]  Name: Adrienne [name] MRN: [id number] Date of Birth: 07/28/1945

## 2019-06-25 ENCOUNTER — Ambulatory Visit (HOSPITAL_COMMUNITY): Payer: Medicare Other | Attending: Orthopedic Surgery | Admitting: Physical Therapy

## 2019-06-25 ENCOUNTER — Other Ambulatory Visit: Payer: Self-pay

## 2019-06-25 DIAGNOSIS — G8929 Other chronic pain: Secondary | ICD-10-CM | POA: Insufficient documentation

## 2019-06-25 DIAGNOSIS — R262 Difficulty in walking, not elsewhere classified: Secondary | ICD-10-CM | POA: Diagnosis not present

## 2019-06-25 DIAGNOSIS — M545 Other chronic pain: Secondary | ICD-10-CM

## 2019-06-25 DIAGNOSIS — M25551 Pain in right hip: Secondary | ICD-10-CM | POA: Insufficient documentation

## 2019-06-25 DIAGNOSIS — M25561 Pain in right knee: Secondary | ICD-10-CM | POA: Diagnosis not present

## 2019-06-25 NOTE — Patient Instructions (Signed)

## 2019-06-26 ENCOUNTER — Encounter (HOSPITAL_COMMUNITY): Payer: Self-pay | Admitting: Physical Therapy

## 2019-06-26 ENCOUNTER — Encounter (HOSPITAL_COMMUNITY): Payer: Medicare Other

## 2019-06-26 DIAGNOSIS — H524 Presbyopia: Secondary | ICD-10-CM | POA: Diagnosis not present

## 2019-06-26 DIAGNOSIS — H2513 Age-related nuclear cataract, bilateral: Secondary | ICD-10-CM | POA: Diagnosis not present

## 2019-06-26 DIAGNOSIS — H40013 Open angle with borderline findings, low risk, bilateral: Secondary | ICD-10-CM | POA: Diagnosis not present

## 2019-06-26 NOTE — Therapy (Signed)
[place], [place], [address] Phone: [phone]   Fax:  [phone]  Physical Therapy Treatment and Recert  [name] MRN: [id number] Date of Birth: Jul 03, 1945 Referring Provider (PT): Earlie Server, MD   Encounter Date: 06/25/2019  PT End of Session - 06/26/19 0826    Visit Number  12    Number of Visits  22    Date for PT Re-Evaluation  06/25/19    Authorization Type  Primary: Medicare, Secondary: BCBS    Authorization Time Period  NEW POC DATES 06/25/19 to 07/30/19    Authorization - Visit Number  3    Authorization - Number of Visits  10    Progress Note Due on Visit  19    PT Start Time  [id number]    PT Stop Time  1710    PT Time Calculation (min)  55 min    Activity Tolerance  Patient tolerated treatment well;Patient limited by pain    Behavior During Therapy  [facility] for tasks assessed/performed       Past Medical History:  Diagnosis Date  . Allergy   . Diverticulosis 05/2009  . Hyperlipidemia   . Hypertension     Past Surgical History:  Procedure Laterality Date  . ABDOMINAL HYSTERECTOMY  1990   for fibroids ovaries remain   . CHOLECYSTECTOMY  1980  . COLONOSCOPY  05/2009   rare diverticula, small internal hemorrhoids. Next colonoscopy in 5-10 yrs.  . COLONOSCOPY N/A 10/23/2018   Procedure: COLONOSCOPY;  Surgeon: Danie Binder, MD;  [facility];  Service: Endoscopy;  Laterality: N/A;  2:00pm  . left nipple inversion bx benign  2000  . POLYPECTOMY  10/23/2018   Procedure: POLYPECTOMY;  Surgeon: Danie Binder, MD;  [facility];  Service: Endoscopy;;    There were no vitals filed for this visit.  Subjective Assessment - 06/25/19 1620    Subjective  States she is medicated right now and currently it's about 4/10 pain. States she is limping. States that she felt like the one stretch helped - the one wiht her leg on  a step. States when she is not  medicated she is 9/10. States after her exercises she does feel a little better. States she doesn't currently have a follow up apt with her MD.         [facility] PT Assessment - 06/26/19 0001      Assessment   Medical Diagnosis  R knee/hip pain, LBP    Referring Provider (PT)  Earlie Server, MD      Balance Screen   Has the patient fallen in the past 6 months  No    Has the patient had a decrease in activity level because of a fear of falling?   No    Is the patient reluctant to leave their home because of a fear of falling?   No      Cognition   Overall Cognitive Status  Within Functional Limits for tasks assessed      ROM / Strength   AROM / PROM / Strength  --      AROM   AROM Assessment Site  Hip    Right/Left Hip  Right;Left    Right Hip Extension  5   felt good, pain abolished in hip   Right Hip External Rotation   30   pain in R hip   Right Hip Internal Rotation  15   right hip pain   Left Hip Extension  15    Left Hip External Rotation   45   pain in R hip   Left Hip Internal Rotation   15   pain in R hip     Palpation   Spinal mobility  pain with PA to lumbar spine L3-5 - centeralized pain    Palpation comment  tenderness to palpation - B- psoas, iliacus, piriformis, glutes. R QL      Ambulation/Gait   Ambulation/Gait  Yes    Ambulation/Gait Assistance  6: Modified independent (Device/Increase time)    Gait Comments  right lateral trunk lean/shift with ambulation, hip hike to compensate with leg clearence.                    [facility] PT Treatment/Exercise - 06/26/19 0001      Lumbar Exercises: Prone   Other Prone Lumbar Exercises  PROM of right hip into extension - tolerated well.       Manual Therapy   Manual Therapy  Joint mobilization;Soft tissue mobilization    Manual therapy comments  all manual completed seperate from rest of interventions    Joint Mobilization  pt prone- PA to right hip grade II/III (did not tolerate figure 4  position), tolerated well  - improved mobility noted and decreased pain afterwards. CPAs to lumbar spine grade II/III     Soft tissue mobilization  STM to B iliacus (tapotement and TPR), R QL, L psoas (R not tolerated), piriformis and glutes              PT Education - 06/26/19 0840    Education Details  throughout education on dry needling, current findings and plan moving forward. Benefits of specific interventions and answered all questions and concerns.    Person(s) Educated  Patient    Methods  Explanation;Handout    Comprehension  Verbalized understanding       PT Short Term Goals - 06/19/19 1447      PT SHORT TERM GOAL #1   Title  Pt will perform HEP at least 2x/week to reduce pain and improve overall ability to complete ADLs.    Baseline  2/23: pt reports performing HEP each morning    Time  3    Period  Weeks    Status  Achieved    Target Date  06/04/19      PT SHORT TERM GOAL #2   Title  Pt will demonstrate lumbar AROM WFL and pain-free to improve ability to complete self care tasks, household chores, and volunteering.    Baseline  2/23: see AROM    Time  3    Period  Weeks    Status  On-going        PT Long Term Goals - 06/19/19 1449      PT LONG TERM GOAL #1   Title  Pt will demonstrate RLE strength equal to LLE to improve gait mechanics, standing tolerance, and reduce pain with funcitonal activities.    Baseline  2/23: see MMT    Time  6    Period  Weeks    Status  On-going      PT LONG TERM GOAL #2   Title  Pt will perform 5x STS in 15 sec, no UE assist, equal BLE weight-bearing and no increased R hip pain.    Baseline  2/23: 18 sec, weight-shifted to LLE, increased R hip pain    Time  6    Period  Weeks    Status  On-going      PT LONG TERM GOAL #3   Title  Pt will self report low back, hip and knee pain 5/10 at worst with standing and ambulation to indicate improvement in ability to grocery shop and navigate community.    Baseline  2/23: 8/10  at worst    Time  6    Period  Weeks    Status  On-going      PT LONG TERM GOAL #4   Title  Pt will improve FOTO score to 25% limited to indicate improvement in overall functional abilities and return to personal exercise program.    Baseline  2/23: 47% limited    Time  6    Period  Weeks    Status  On-going            Plan - 06/26/19 0827    Clinical Impression Statement  Patient present today for continued assessment of hip and back pain. Examination revealed limitations in right hip mobility as well as musculature limitations both prone and with ambulation. Discussed findings with patient and plan moving forward. Extending POC additional 5 weeks to focus on hip mobility and improve functional use and length of hip/trunk musculature. Patient verbalized understanding. Patient responded well to treatment with less pain noted with walking after hip mobilizations. Patient would continue to benefit from skilled physical therapy at this time.    Personal Factors and Comorbidities  Past/Current Experience;Time since onset of injury/illness/exacerbation    Examination-Activity Limitations  Lift;Locomotion Level;Stand;Transfers    Examination-Participation Restrictions  Church;Cleaning;Meal Prep;Volunteer    Stability/Clinical Decision Making  Stable/Uncomplicated    Rehab Potential  Good    PT Frequency  2x / week    PT Duration  Other (comment)   5 weeks   PT Treatment/Interventions  ADLs/Self Care Home Management;Aquatic Therapy;Biofeedback;Cryotherapy;Moist Heat;Traction;Ultrasound;DME Instruction;Gait training;Stair training;Functional mobility training;Therapeutic activities;Therapeutic exercise;Balance training;Neuromuscular re-education;Patient/family education;Orthotic Fit/Training;Manual techniques;Passive range of motion;Dry needling;Taping;Joint Manipulations    PT Next Visit Plan  Add dry needling and aquatic therapy to treatments.hip mobilizations (lateral, PA), work on R  QL/trunk lengthening, glute activation and hip extension with ambulation and in different positions    PT Home Exercise Plan  Eval: posterior pelvic tilt; 1/26: prone hip flexor stretch with bedsheet; 2/9: IASTM with ball to R hip for pain relief    Consulted and Agree with Plan of Care  Patient       Patient will benefit from skilled therapeutic intervention in order to improve the following deficits and impairments:  Abnormal gait, Decreased activity tolerance, Decreased balance, Decreased endurance, Decreased range of motion, Decreased strength, Difficulty walking, Increased fascial restricitons, Increased muscle spasms, Impaired perceived functional ability, Impaired flexibility, Impaired vision/preception, Improper body mechanics, Postural dysfunction, Pain  Visit Diagnosis: Pain in right hip  Chronic pain of right knee  Difficulty in walking, not elsewhere classified  Chronic bilateral low back pain without sciatica     Problem List Patient Active Problem List   Diagnosis Date Noted  . Knee pain, right 04/06/2019  . Left ankle sprain 04/06/2019  . Personal history of colonic polyps 07/12/2018  . Ingrown right big toenail 03/23/2017  . Hip pain, chronic, right 02/03/2016  . GERD (gastroesophageal reflux disease) 12/26/2014  . Diverticulosis of colon without hemorrhage 07/05/2014  . FH: pancreatic cancer 01/26/2014  . FH: glaucoma 03/24/2013  . Overweight (BMI 25.0-29.9) 03/13/2012  . Hyperlipidemia LDL goal <100 04/03/2009  . Essential  hypertension 04/03/2009   8:46 AM, 06/26/19 Jerene Pitch, DPT Physical Therapy with [facility]  [phone] office  [place] [address] [place], [place], [address] Phone: [phone]   Fax:  [phone]  Name: Shelagh Brouse MRN: [id number] Date of Birth: November 02, 1945

## 2019-06-28 ENCOUNTER — Ambulatory Visit (HOSPITAL_COMMUNITY): Payer: Medicare Other | Admitting: Physical Therapy

## 2019-06-28 ENCOUNTER — Encounter (HOSPITAL_COMMUNITY): Payer: Self-pay | Admitting: Physical Therapy

## 2019-06-28 ENCOUNTER — Other Ambulatory Visit: Payer: Self-pay

## 2019-06-28 DIAGNOSIS — M25561 Pain in right knee: Secondary | ICD-10-CM | POA: Diagnosis not present

## 2019-06-28 DIAGNOSIS — M25551 Pain in right hip: Secondary | ICD-10-CM | POA: Diagnosis not present

## 2019-06-28 DIAGNOSIS — G8929 Other chronic pain: Secondary | ICD-10-CM

## 2019-06-28 DIAGNOSIS — M545 Other chronic pain: Secondary | ICD-10-CM

## 2019-06-28 DIAGNOSIS — R262 Difficulty in walking, not elsewhere classified: Secondary | ICD-10-CM

## 2019-06-28 NOTE — Therapy (Signed)
[name], [place], [address] Phone: [phone]   Fax:  [phone]  Physical Therapy Treatment  [name] MRN: [id number] Date of Birth: 1945-12-01 Referring Provider (PT): Earlie Server, MD   Encounter Date: 06/28/2019  PT End of Session - 06/28/19 1448    Visit Number  13    Number of Visits  22    Date for PT Re-Evaluation  06/25/19    Authorization Type  Primary: Medicare, Secondary: BCBS    Authorization Time Period  NEW POC DATES 06/25/19 to 07/30/19    Authorization - Visit Number  4    Authorization - Number of Visits  10    Progress Note Due on Visit  19    PT Start Time  1448    PT Stop Time  1533    PT Time Calculation (min)  45 min    Activity Tolerance  Patient tolerated treatment well;Patient limited by pain    Behavior During Therapy  [facility] for tasks assessed/performed       Past Medical History:  Diagnosis Date  . Allergy   . Diverticulosis 05/2009  . Hyperlipidemia   . Hypertension     Past Surgical History:  Procedure Laterality Date  . ABDOMINAL HYSTERECTOMY  1990   for fibroids ovaries remain   . CHOLECYSTECTOMY  1980  . COLONOSCOPY  05/2009   rare diverticula, small internal hemorrhoids. Next colonoscopy in 5-10 yrs.  . COLONOSCOPY N/A 10/23/2018   Procedure: COLONOSCOPY;  Surgeon: Danie Binder, MD;  [facility];  Service: Endoscopy;  Laterality: N/A;  2:00pm  . left nipple inversion bx benign  2000  . POLYPECTOMY  10/23/2018   Procedure: POLYPECTOMY;  Surgeon: Danie Binder, MD;  [facility];  Service: Endoscopy;;    There were no vitals filed for this visit.  Subjective Assessment - 06/28/19 1450    Subjective  States walking is about a 4/10, sitting she has no pain. States she was feeling better up until today.         [facility] PT Assessment - 06/28/19 0001      Assessment   Medical Diagnosis  R knee/hip pain, LBP    Referring Provider (PT)  Earlie Server, MD                   [facility] Adult PT Treatment/Exercise - 06/28/19 0001      Lumbar Exercises: Standing   Other Standing Lumbar Exercises  R trunk stretch at wall - left side bending 2x10 - improved gait mechanics noted after      Lumbar Exercises: Prone   Other Prone Lumbar Exercises  hip extension AROM -unable at first - after manual improved ROM bu tpain at R SIJ - pain deminished with UPA at R SIJ 5x5    Other Prone Lumbar Exercises  PROM R hip into extension with towel at thigh to assist. ; glute isometric x10 5" holds --> to standign x10, 5" holds - hip "adjusted" each times per patient      Manual Therapy   Manual Therapy  Joint mobilization;Soft tissue mobilization    Manual therapy comments  all manual completed seperate from rest of interventions    Joint Mobilization  UPA to R hip patient prone and to right sacral border grade II/III for pain and mobility.     Soft tissue mobilization  STM to right sacral border - lateral side,  right piriformis, glut med and QL and iliopsoas.              PT Education - 06/28/19 1545    Education Details  Educated patient on pelvis anatomy, how muscles help with movement, and why she is getting sharp pain. Discussed anticipated soreness and pain but to keep open dialogue to ensure pain does not escalate.    Person(s) Educated  Patient    Methods  Explanation    Comprehension  Verbalized understanding       PT Short Term Goals - 06/19/19 1447      PT SHORT TERM GOAL #1   Title  Pt will perform HEP at least 2x/week to reduce pain and improve overall ability to complete ADLs.    Baseline  2/23: pt reports performing HEP each morning    Time  3    Period  Weeks    Status  Achieved    Target Date  06/04/19      PT SHORT TERM GOAL #2   Title  Pt will demonstrate lumbar AROM WFL and pain-free to improve ability to complete self care tasks, household chores, and volunteering.     Baseline  2/23: see AROM    Time  3    Period  Weeks    Status  On-going        PT Long Term Goals - 06/19/19 1449      PT LONG TERM GOAL #1   Title  Pt will demonstrate RLE strength equal to LLE to improve gait mechanics, standing tolerance, and reduce pain with funcitonal activities.    Baseline  2/23: see MMT    Time  6    Period  Weeks    Status  On-going      PT LONG TERM GOAL #2   Title  Pt will perform 5x STS in 15 sec, no UE assist, equal BLE weight-bearing and no increased R hip pain.    Baseline  2/23: 18 sec, weight-shifted to LLE, increased R hip pain    Time  6    Period  Weeks    Status  On-going      PT LONG TERM GOAL #3   Title  Pt will self report low back, hip and knee pain 5/10 at worst with standing and ambulation to indicate improvement in ability to grocery shop and navigate community.    Baseline  2/23: 8/10 at worst    Time  6    Period  Weeks    Status  On-going      PT LONG TERM GOAL #4   Title  Pt will improve FOTO score to 25% limited to indicate improvement in overall functional abilities and return to personal exercise program.    Baseline  2/23: 47% limited    Time  6    Period  Weeks    Status  On-going            Plan - 06/28/19 1549    Clinical Impression Statement  Patient tolerated session moderately well. Improved walking mechanics noted and improved active hip extension noted after today's interventions. Increased pain noted with standing afterwards though, but this decreased with time and with active glute activation. To note, patient reported feeling her hip "adjust into place" with standing hip extension and she reported she felt it was what she needed. Will follow up with patient's post treatment soreness and continue with active glute strength and hip extension and opening up right  trunk as patient is severely limited in these regions and this limitation directly effects how she ambulates. Patient will continue to benefit from  skilled physical therapy at this time.    Personal Factors and Comorbidities  Past/Current Experience;Time since onset of injury/illness/exacerbation    Examination-Activity Limitations  Lift;Locomotion Level;Stand;Transfers    Examination-Participation Restrictions  Church;Cleaning;Meal Prep;Volunteer    Stability/Clinical Decision Making  Stable/Uncomplicated    Rehab Potential  Good    PT Frequency  2x / week    PT Duration  Other (comment)   5 weeks   PT Treatment/Interventions  ADLs/Self Care Home Management;Aquatic Therapy;Biofeedback;Cryotherapy;Moist Heat;Traction;Ultrasound;DME Instruction;Gait training;Stair training;Functional mobility training;Therapeutic activities;Therapeutic exercise;Balance training;Neuromuscular re-education;Patient/family education;Orthotic Fit/Training;Manual techniques;Passive range of motion;Dry needling;Taping;Joint Manipulations    PT Next Visit Plan  Add dry needling and aquatic therapy to treatments.hip mobilizations (lateral, PA), work on R QL/trunk lengthening, glute activation and hip extension with ambulation and in different positions    PT Home Exercise Plan  Eval: posterior pelvic tilt; 1/26: prone hip flexor stretch with bedsheet; 2/9: IASTM with ball to R hip for pain relief    Consulted and Agree with Plan of Care  Patient       Patient will benefit from skilled therapeutic intervention in order to improve the following deficits and impairments:  Abnormal gait, Decreased activity tolerance, Decreased balance, Decreased endurance, Decreased range of motion, Decreased strength, Difficulty walking, Increased fascial restricitons, Increased muscle spasms, Impaired perceived functional ability, Impaired flexibility, Impaired vision/preception, Improper body mechanics, Postural dysfunction, Pain  Visit Diagnosis: Pain in right hip  Chronic pain of right knee  Difficulty in walking, not elsewhere classified  Chronic bilateral low back pain without  sciatica     Problem List Patient Active Problem List   Diagnosis Date Noted  . Knee pain, right 04/06/2019  . Left ankle sprain 04/06/2019  . Personal history of colonic polyps 07/12/2018  . Ingrown right big toenail 03/23/2017  . Hip pain, chronic, right 02/03/2016  . GERD (gastroesophageal reflux disease) 12/26/2014  . Diverticulosis of colon without hemorrhage 07/05/2014  . FH: pancreatic cancer 01/26/2014  . FH: glaucoma 03/24/2013  . Overweight (BMI 25.0-29.9) 03/13/2012  . Hyperlipidemia LDL goal <100 04/03/2009  . Essential hypertension 04/03/2009   3:51 PM, 06/28/19 Jerene Pitch, DPT Physical Therapy with [facility]  [phone] office  [place] [address] [place], [place], [address] Phone: [phone]   Fax:  [phone]  Name: Shanley Hauschild MRN: [id number] Date of Birth: June 15, 1945

## 2019-07-02 ENCOUNTER — Ambulatory Visit (HOSPITAL_COMMUNITY): Payer: Medicare Other | Admitting: Physical Therapy

## 2019-07-02 ENCOUNTER — Other Ambulatory Visit: Payer: Self-pay

## 2019-07-02 ENCOUNTER — Encounter (HOSPITAL_COMMUNITY): Payer: Self-pay | Admitting: Physical Therapy

## 2019-07-02 DIAGNOSIS — M25551 Pain in right hip: Secondary | ICD-10-CM

## 2019-07-02 DIAGNOSIS — R262 Difficulty in walking, not elsewhere classified: Secondary | ICD-10-CM | POA: Diagnosis not present

## 2019-07-02 DIAGNOSIS — G8929 Other chronic pain: Secondary | ICD-10-CM | POA: Diagnosis not present

## 2019-07-02 DIAGNOSIS — M545 Other chronic pain: Secondary | ICD-10-CM

## 2019-07-02 DIAGNOSIS — M25561 Pain in right knee: Secondary | ICD-10-CM | POA: Diagnosis not present

## 2019-07-02 NOTE — Therapy (Signed)
[place], [place], [address] Phone: [phone]   Fax:  [phone]  Physical Therapy Treatment  [name] MRN: [id number] Date of Birth: July 03, 1945 Referring Provider (PT): Earlie Server, MD   Encounter Date: 07/02/2019  PT End of Session - 07/02/19 1406    Visit Number  14    Number of Visits  22    Date for PT Re-Evaluation  06/25/19    Authorization Type  Primary: Medicare, Secondary: BCBS    Authorization Time Period  NEW POC DATES 06/25/19 to 07/30/19    Authorization - Visit Number  5    Authorization - Number of Visits  10    Progress Note Due on Visit  19    PT Start Time  [id number]    PT Stop Time  1445    PT Time Calculation (min)  42 min    Activity Tolerance  Patient tolerated treatment well;Patient limited by pain    Behavior During Therapy  [facility] for tasks assessed/performed       Past Medical History:  Diagnosis Date  . Allergy   . Diverticulosis 05/2009  . Hyperlipidemia   . Hypertension     Past Surgical History:  Procedure Laterality Date  . ABDOMINAL HYSTERECTOMY  1990   for fibroids ovaries remain   . CHOLECYSTECTOMY  1980  . COLONOSCOPY  05/2009   rare diverticula, small internal hemorrhoids. Next colonoscopy in 5-10 yrs.  . COLONOSCOPY N/A 10/23/2018   Procedure: COLONOSCOPY;  Surgeon: Danie Binder, MD;  [facility];  Service: Endoscopy;  Laterality: N/A;  2:00pm  . left nipple inversion bx benign  2000  . POLYPECTOMY  10/23/2018   Procedure: POLYPECTOMY;  Surgeon: Danie Binder, MD;  [facility];  Service: Endoscopy;;    There were no vitals filed for this visit.  Subjective Assessment - 07/02/19 1408    Subjective  States she has been in pain and has been limping over the weekend secondary to not having her pain meds. States that her current pain is 7/10. 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         [facility] PT Assessment - 07/02/19  0001      Assessment   Medical Diagnosis  R knee/hip pain, LBP    Referring Provider (PT)  Earlie Server, MD                   [facility] Adult PT Treatment/Exercise - 07/02/19 0001      Ambulation/Gait   Ambulation/Gait  Yes    Ambulation/Gait Assistance  6: Modified independent (Device/Increase time)    Assistive device  Straight cane    Ambulation Surface  Level;Indoor    Gait Comments  focus on decreased right lateral sway and Right SB in trunk - practiced with cane in either hand with ambulation and without- practiced with visual and verbal cues.- 15 minutes total       Lumbar Exercises: Supine   Other Supine Lumbar Exercises  self mobiliztation to R hip add/IR/flexors - demonstrated and practiced.     Other Supine Lumbar Exercises  hip abd simoultaneously. 5x5 5" holds      Manual Therapy   Manual Therapy  Soft tissue mobilization    Manual therapy comments  all manual completed seperate from rest of interventions    Soft tissue mobilization  STM to R hip adductors,hamstring adn iliopsoas muscle - tolerated moderately well  PT Short Term Goals - 06/19/19 1447      PT SHORT TERM GOAL #1   Title  Pt will perform HEP at least 2x/week to reduce pain and improve overall ability to complete ADLs.    Baseline  2/23: pt reports performing HEP each morning    Time  3    Period  Weeks    Status  Achieved    Target Date  06/04/19      PT SHORT TERM GOAL #2   Title  Pt will demonstrate lumbar AROM WFL and pain-free to improve ability to complete self care tasks, household chores, and volunteering.    Baseline  2/23: see AROM    Time  3    Period  Weeks    Status  On-going        PT Long Term Goals - 06/19/19 1449      PT LONG TERM GOAL #1   Title  Pt will demonstrate RLE strength equal to LLE to improve gait mechanics, standing tolerance, and reduce pain with funcitonal activities.    Baseline  2/23: see MMT    Time  6    Period  Weeks     Status  On-going      PT LONG TERM GOAL #2   Title  Pt will perform 5x STS in 15 sec, no UE assist, equal BLE weight-bearing and no increased R hip pain.    Baseline  2/23: 18 sec, weight-shifted to LLE, increased R hip pain    Time  6    Period  Weeks    Status  On-going      PT LONG TERM GOAL #3   Title  Pt will self report low back, hip and knee pain 5/10 at worst with standing and ambulation to indicate improvement in ability to grocery shop and navigate community.    Baseline  2/23: 8/10 at worst    Time  6    Period  Weeks    Status  On-going      PT LONG TERM GOAL #4   Title  Pt will improve FOTO score to 25% limited to indicate improvement in overall functional abilities and return to personal exercise program.    Baseline  2/23: 47% limited    Time  6    Period  Weeks    Status  On-going            Plan - 07/02/19 1539    Clinical Impression Statement  Patient tolerated manual work well reporting no pain afterwards in supine position and improved hip abduction noted. Patient reported she had not been able to bring her knees out to the side that wide for a long time. Upon standing, pain returned and so did patient's Trendelenburg, R trunk SB and hip adduction. Educated patient on benefits of cane use to decrease force on hip and back and improve gait mechanics and strengthen/work on mobility required for proper gait mechanics. Patient verbalized understanding and eager to work on improving these components of gait. Patient will continue to benefit from skilled physical therapy to improve functional mobility and QOL.    Personal Factors and Comorbidities  Past/Current Experience;Time since onset of injury/illness/exacerbation    Examination-Activity Limitations  Lift;Locomotion Level;Stand;Transfers    Examination-Participation Restrictions  Church;Cleaning;Meal Prep;Volunteer    Stability/Clinical Decision Making  Stable/Uncomplicated    Rehab Potential  Good    PT  Frequency  2x / week    PT Duration  Other (comment)  5 weeks   PT Treatment/Interventions  ADLs/Self Care Home Management;Aquatic Therapy;Biofeedback;Cryotherapy;Moist Heat;Traction;Ultrasound;DME Instruction;Gait training;Stair training;Functional mobility training;Therapeutic activities;Therapeutic exercise;Balance training;Neuromuscular re-education;Patient/family education;Orthotic Fit/Training;Manual techniques;Passive range of motion;Dry needling;Taping;Joint Manipulations    PT Next Visit Plan  gait mechanics - focus on hip extension, trendelberg and trunk SB. Add dry needling and aquatic therapy to treatments.hip mobilizations (lateral, PA), work on R QL/trunk lengthening, glute activation and hip extension with ambulation and in different positions    PT Home Exercise Plan  Eval: posterior pelvic tilt; 1/26: prone hip flexor stretch with bedsheet; 2/9: IASTM with ball to R hip for pain relief    Consulted and Agree with Plan of Care  Patient       Patient will benefit from skilled therapeutic intervention in order to improve the following deficits and impairments:  Abnormal gait, Decreased activity tolerance, Decreased balance, Decreased endurance, Decreased range of motion, Decreased strength, Difficulty walking, Increased fascial restricitons, Increased muscle spasms, Impaired perceived functional ability, Impaired flexibility, Impaired vision/preception, Improper body mechanics, Postural dysfunction, Pain  Visit Diagnosis: Difficulty in walking, not elsewhere classified  Pain in right hip  Chronic pain of right knee  Chronic bilateral low back pain without sciatica     Problem List Patient Active Problem List   Diagnosis Date Noted  . Knee pain, right 04/06/2019  . Left ankle sprain 04/06/2019  . Personal history of colonic polyps 07/12/2018  . Ingrown right big toenail 03/23/2017  . Hip pain, chronic, right 02/03/2016  . GERD (gastroesophageal reflux disease) 12/26/2014   . Diverticulosis of colon without hemorrhage 07/05/2014  . FH: pancreatic cancer 01/26/2014  . FH: glaucoma 03/24/2013  . Overweight (BMI 25.0-29.9) 03/13/2012  . Hyperlipidemia LDL goal <100 04/03/2009  . Essential hypertension 04/03/2009   3:40 PM, 07/02/19 Jerene Pitch, DPT Physical Therapy with [facility]  [phone] office  [place] [address] [place], [place], [address] Phone: [phone]   Fax:  [phone]  Name: Ruari Appell MRN: [id number] Date of Birth: December 12, 1945

## 2019-07-04 ENCOUNTER — Encounter (HOSPITAL_COMMUNITY): Payer: Self-pay | Admitting: Physical Therapy

## 2019-07-04 ENCOUNTER — Ambulatory Visit (HOSPITAL_COMMUNITY): Payer: Medicare Other | Admitting: Physical Therapy

## 2019-07-04 ENCOUNTER — Other Ambulatory Visit: Payer: Self-pay

## 2019-07-04 DIAGNOSIS — R262 Difficulty in walking, not elsewhere classified: Secondary | ICD-10-CM | POA: Diagnosis not present

## 2019-07-04 DIAGNOSIS — M545 Other chronic pain: Secondary | ICD-10-CM

## 2019-07-04 DIAGNOSIS — G8929 Other chronic pain: Secondary | ICD-10-CM | POA: Diagnosis not present

## 2019-07-04 DIAGNOSIS — M25561 Pain in right knee: Secondary | ICD-10-CM

## 2019-07-04 DIAGNOSIS — M25551 Pain in right hip: Secondary | ICD-10-CM

## 2019-07-04 NOTE — Therapy (Signed)
[name], [place], [address] Phone: [phone]   Fax:  [phone]  Physical Therapy Treatment  [name] MRN: [id number] Date of Birth: February 16, 1946 Referring Provider (PT): Earlie Server, MD   Encounter Date: 07/04/2019  PT End of Session - 07/04/19 1619    Visit Number  15    Number of Visits  22    Date for PT Re-Evaluation  06/25/19    Authorization Type  Primary: Medicare, Secondary: BCBS    Authorization Time Period  NEW POC DATES 06/25/19 to 07/30/19    Authorization - Visit Number  6    Authorization - Number of Visits  10    Progress Note Due on Visit  19    PT Start Time  1404    PT Stop Time  1454    PT Time Calculation (min)  50 min    Activity Tolerance  Patient tolerated treatment well;Patient limited by pain    Behavior During Therapy  [facility] for tasks assessed/performed       Past Medical History:  Diagnosis Date  . Allergy   . Diverticulosis 05/2009  . Hyperlipidemia   . Hypertension     Past Surgical History:  Procedure Laterality Date  . ABDOMINAL HYSTERECTOMY  1990   for fibroids ovaries remain   . CHOLECYSTECTOMY  1980  . COLONOSCOPY  05/2009   rare diverticula, small internal hemorrhoids. Next colonoscopy in 5-10 yrs.  . COLONOSCOPY N/A 10/23/2018   Procedure: COLONOSCOPY;  Surgeon: Danie Binder, MD;  [facility];  Service: Endoscopy;  Laterality: N/A;  2:00pm  . left nipple inversion bx benign  2000  . POLYPECTOMY  10/23/2018   Procedure: POLYPECTOMY;  Surgeon: Danie Binder, MD;  [facility];  Service: Endoscopy;;    There were no vitals filed for this visit.  Subjective Assessment - 07/04/19 1406    Subjective  States that she was sore after last session which lasted till about 7pm that evening and then she walked to her borther's house which is about 5 minutes away but she hasn't done that in a long time. She is very  excited about this accomplishment. States she has been massaging that area and her pain only just came back today.    Currently in Pain?  Yes    Pain Score  3     Pain Location  Hip    Pain Orientation  Right         [facility] PT Assessment - 07/04/19 0001      Assessment   Medical Diagnosis  R knee/hip pain, LBP    Referring Provider (PT)  Earlie Server, MD                   [facility] Adult PT Treatment/Exercise - 07/04/19 0001      Lumbar Exercises: Standing   Other Standing Lumbar Exercises  straddle stance - lateral hip movements 2x15, 5" holds       Lumbar Exercises: Seated   Other Seated Lumbar Exercises  lunge stretch on chair right hip flexor - 45 second hold R x6    Other Seated Lumbar Exercises  self mobilization with lacrosse ball to right groin region       Lumbar Exercises: Sidelying   Other Sidelying Lumbar Exercises  left side lying - PROM of  right hip extension with and without contract/relax      Manual Therapy  Manual Therapy  Joint mobilization;Soft tissue mobilization    Manual therapy comments  all manual completed seperate from rest of interventions    Joint Mobilization  pelvic mobilizations to promote right ilium anteiror rotation grade III tolerated well     Soft tissue mobilization  STM to iliopsoas, adductors, glute med and hip ER - tolerated well                PT Short Term Goals - 06/19/19 1447      PT SHORT TERM GOAL #1   Title  Pt will perform HEP at least 2x/week to reduce pain and improve overall ability to complete ADLs.    Baseline  2/23: pt reports performing HEP each morning    Time  3    Period  Weeks    Status  Achieved    Target Date  06/04/19      PT SHORT TERM GOAL #2   Title  Pt will demonstrate lumbar AROM WFL and pain-free to improve ability to complete self care tasks, household chores, and volunteering.    Baseline  2/23: see AROM    Time  3    Period  Weeks    Status  On-going        PT Long Term  Goals - 06/19/19 1449      PT LONG TERM GOAL #1   Title  Pt will demonstrate RLE strength equal to LLE to improve gait mechanics, standing tolerance, and reduce pain with funcitonal activities.    Baseline  2/23: see MMT    Time  6    Period  Weeks    Status  On-going      PT LONG TERM GOAL #2   Title  Pt will perform 5x STS in 15 sec, no UE assist, equal BLE weight-bearing and no increased R hip pain.    Baseline  2/23: 18 sec, weight-shifted to LLE, increased R hip pain    Time  6    Period  Weeks    Status  On-going      PT LONG TERM GOAL #3   Title  Pt will self report low back, hip and knee pain 5/10 at worst with standing and ambulation to indicate improvement in ability to grocery shop and navigate community.    Baseline  2/23: 8/10 at worst    Time  6    Period  Weeks    Status  On-going      PT LONG TERM GOAL #4   Title  Pt will improve FOTO score to 25% limited to indicate improvement in overall functional abilities and return to personal exercise program.    Baseline  2/23: 47% limited    Time  6    Period  Weeks    Status  On-going            Plan - 07/04/19 1410    Clinical Impression Statement  Patient tolerated session moderately well. Able to lay right leg flat on table after manual work (previously not able to do so in supine position and leg extended caused hip pain). Minor increase in pain in right anterior hip at end of session but improved gait mechanics noted.  Discussed trying new movements at home. Will follow up with continued gait improvement next session.    Personal Factors and Comorbidities  Past/Current Experience;Time since onset of injury/illness/exacerbation    Examination-Activity Limitations  Lift;Locomotion Level;Stand;Transfers    Examination-Participation Restrictions  Church;Cleaning;Meal Prep;Volunteer  Stability/Clinical Decision Making  Stable/Uncomplicated    Rehab Potential  Good    PT Frequency  2x / week    PT Duration   Other (comment)   5 weeks   PT Treatment/Interventions  ADLs/Self Care Home Management;Aquatic Therapy;Biofeedback;Cryotherapy;Moist Heat;Traction;Ultrasound;DME Instruction;Gait training;Stair training;Functional mobility training;Therapeutic activities;Therapeutic exercise;Balance training;Neuromuscular re-education;Patient/family education;Orthotic Fit/Training;Manual techniques;Passive range of motion;Dry needling;Taping;Joint Manipulations    PT Next Visit Plan  gait mechanics - focus on hip extension, trendelberg and trunk SB. Add dry needling and aquatic therapy to treatments.hip mobilizations (lateral, PA), work on R QL/trunk lengthening, glute activation and hip extension with ambulation and in different positions    PT Home Exercise Plan  3/10 self mobilziation to adductors and hip flexors, glute isometrics, seated lunge stretch and straddle stance, hip abd in supine.    Consulted and Agree with Plan of Care  Patient       Patient will benefit from skilled therapeutic intervention in order to improve the following deficits and impairments:  Abnormal gait, Decreased activity tolerance, Decreased balance, Decreased endurance, Decreased range of motion, Decreased strength, Difficulty walking, Increased fascial restricitons, Increased muscle spasms, Impaired perceived functional ability, Impaired flexibility, Impaired vision/preception, Improper body mechanics, Postural dysfunction, Pain  Visit Diagnosis: Difficulty in walking, not elsewhere classified  Pain in right hip  Chronic pain of right knee  Chronic bilateral low back pain without sciatica     Problem List Patient Active Problem List   Diagnosis Date Noted  . Knee pain, right 04/06/2019  . Left ankle sprain 04/06/2019  . Personal history of colonic polyps 07/12/2018  . Ingrown right big toenail 03/23/2017  . Hip pain, chronic, right 02/03/2016  . GERD (gastroesophageal reflux disease) 12/26/2014  . Diverticulosis of  colon without hemorrhage 07/05/2014  . FH: pancreatic cancer 01/26/2014  . FH: glaucoma 03/24/2013  . Overweight (BMI 25.0-29.9) 03/13/2012  . Hyperlipidemia LDL goal <100 04/03/2009  . Essential hypertension 04/03/2009    4:19 PM, 07/04/19 Jerene Pitch, DPT Physical Therapy with [facility]  [phone] office  [place] [address] [place], [place], [address] Phone: [phone]   Fax:  [phone]  Name: Shadeja Reel MRN: [id number] Date of Birth: 13-Oct-1945

## 2019-07-10 ENCOUNTER — Other Ambulatory Visit: Payer: Self-pay

## 2019-07-10 ENCOUNTER — Encounter (HOSPITAL_COMMUNITY): Payer: Self-pay | Admitting: Physical Therapy

## 2019-07-10 ENCOUNTER — Ambulatory Visit (HOSPITAL_COMMUNITY): Payer: Medicare Other | Admitting: Physical Therapy

## 2019-07-10 DIAGNOSIS — M545 Other chronic pain: Secondary | ICD-10-CM

## 2019-07-10 DIAGNOSIS — R262 Difficulty in walking, not elsewhere classified: Secondary | ICD-10-CM

## 2019-07-10 DIAGNOSIS — G8929 Other chronic pain: Secondary | ICD-10-CM | POA: Diagnosis not present

## 2019-07-10 DIAGNOSIS — M25551 Pain in right hip: Secondary | ICD-10-CM | POA: Diagnosis not present

## 2019-07-10 DIAGNOSIS — M25561 Pain in right knee: Secondary | ICD-10-CM | POA: Diagnosis not present

## 2019-07-10 NOTE — Therapy (Signed)
[place], [place], [address] Phone: [phone]   Fax:  [phone]  Physical Therapy Treatment  [name] MRN: [id number] Date of Birth: 10-22-1945 Referring Provider (PT): Earlie Server, MD   Encounter Date: 07/10/2019  PT End of Session - 07/10/19 1447    Visit Number  16    Number of Visits  22    Date for PT Re-Evaluation  06/25/19    Authorization Type  Primary: Medicare, Secondary: BCBS    Authorization Time Period  NEW POC DATES 06/25/19 to 07/30/19    Authorization - Visit Number  7    Authorization - Number of Visits  10    Progress Note Due on Visit  19    PT Start Time  [id number]    PT Stop Time  1532    PT Time Calculation (min)  45 min    Activity Tolerance  Patient tolerated treatment well;Patient limited by pain    Behavior During Therapy  [facility] for tasks assessed/performed       Past Medical History:  Diagnosis Date  . Allergy   . Diverticulosis 05/2009  . Hyperlipidemia   . Hypertension     Past Surgical History:  Procedure Laterality Date  . ABDOMINAL HYSTERECTOMY  1990   for fibroids ovaries remain   . CHOLECYSTECTOMY  1980  . COLONOSCOPY  05/2009   rare diverticula, small internal hemorrhoids. Next colonoscopy in 5-10 yrs.  . COLONOSCOPY N/A 10/23/2018   Procedure: COLONOSCOPY;  Surgeon: Danie Binder, MD;  [facility];  Service: Endoscopy;  Laterality: N/A;  2:00pm  . left nipple inversion bx benign  2000  . POLYPECTOMY  10/23/2018   Procedure: POLYPECTOMY;  Surgeon: Danie Binder, MD;  [facility];  Service: Endoscopy;;    There were no vitals filed for this visit.  Subjective Assessment - 07/10/19 1449    Subjective  States that after last session she was in pain and it lasted for about 3 days across her back and into the right buttocks. States that she just left it alone and then yesterday started to do some stretches. States  she feels each time she feels she is getting better. States for the first time in a while she went to the store and she actually was able to walk through the store and though she was tired she was able to do it.    Currently in Pain?  Yes    Pain Score  3          [facility] PT Assessment - 07/10/19 0001      Assessment   Medical Diagnosis  R knee/hip pain, LBP    Referring Provider (PT)  Earlie Server, MD                   [facility] Adult PT Treatment/Exercise - 07/10/19 0001      Ambulation/Gait   Gait Comments  gait training wtih tactile, verbal and prior demonstration to improve glute activation in stance phase - practiced in glinic and educated patient on - 15 minutes       Lumbar Exercises: Supine   Other Supine Lumbar Exercises  TRA activation with tactile and verbal cues - 5 minutes --> heel slides (unable to pick up leg without pain) - 2x15 R , with bent knee fall out and min tactile assist with R leg to decrease symptoms    Other Supine  Lumbar Exercises  hip extension isometric R x10 5" holds - leg fully extended      Manual Therapy   Manual Therapy  Soft tissue mobilization    Manual therapy comments  all manual completed seperate from rest of interventions    Soft tissue mobilization  STM/TPR to right Iliopsoas and hip adductors - tolerated well - able to fully extend right knee in supine afterwards               PT Short Term Goals - 06/19/19 1447      PT SHORT TERM GOAL #1   Title  Pt will perform HEP at least 2x/week to reduce pain and improve overall ability to complete ADLs.    Baseline  2/23: pt reports performing HEP each morning    Time  3    Period  Weeks    Status  Achieved    Target Date  06/04/19      PT SHORT TERM GOAL #2   Title  Pt will demonstrate lumbar AROM WFL and pain-free to improve ability to complete self care tasks, household chores, and volunteering.    Baseline  2/23: see AROM    Time  3    Period  Weeks    Status   On-going        PT Long Term Goals - 06/19/19 1449      PT LONG TERM GOAL #1   Title  Pt will demonstrate RLE strength equal to LLE to improve gait mechanics, standing tolerance, and reduce pain with funcitonal activities.    Baseline  2/23: see MMT    Time  6    Period  Weeks    Status  On-going      PT LONG TERM GOAL #2   Title  Pt will perform 5x STS in 15 sec, no UE assist, equal BLE weight-bearing and no increased R hip pain.    Baseline  2/23: 18 sec, weight-shifted to LLE, increased R hip pain    Time  6    Period  Weeks    Status  On-going      PT LONG TERM GOAL #3   Title  Pt will self report low back, hip and knee pain 5/10 at worst with standing and ambulation to indicate improvement in ability to grocery shop and navigate community.    Baseline  2/23: 8/10 at worst    Time  6    Period  Weeks    Status  On-going      PT LONG TERM GOAL #4   Title  Pt will improve FOTO score to 25% limited to indicate improvement in overall functional abilities and return to personal exercise program.    Baseline  2/23: 47% limited    Time  6    Period  Weeks    Status  On-going            Plan - 07/10/19 1609    Clinical Impression Statement  Focus today was on improving hip and TRA activation. Patient required tactile and verbal cues with all activities. Improved ability to activate core noted but continued difficulties with glutes. Educated patient on visualization and practicing with and without movement as well as unilaterally and bilaterally. Patient verbalized understanding. Will follow up with patient about ability to activate these muscles next session. Patient would continue to benefit from skilled physical therapy to improve functional mobility.    Personal Factors and Comorbidities  Past/Current Experience;Time since onset  of injury/illness/exacerbation    Examination-Activity Limitations  Lift;Locomotion Level;Stand;Transfers    Examination-Participation  Restrictions  Church;Cleaning;Meal Prep;Volunteer    Stability/Clinical Decision Making  Stable/Uncomplicated    Rehab Potential  Good    PT Frequency  2x / week    PT Duration  Other (comment)   5 weeks   PT Treatment/Interventions  ADLs/Self Care Home Management;Aquatic Therapy;Biofeedback;Cryotherapy;Moist Heat;Traction;Ultrasound;DME Instruction;Gait training;Stair training;Functional mobility training;Therapeutic activities;Therapeutic exercise;Balance training;Neuromuscular re-education;Patient/family education;Orthotic Fit/Training;Manual techniques;Passive range of motion;Dry needling;Taping;Joint Manipulations    PT Next Visit Plan  gait mechanics - focus on hip extension, trendelberg and trunk SB. Add dry needling and aquatic therapy to treatments.hip mobilizations (lateral, PA), work on R QL/trunk lengthening, glute activation and hip extension with ambulation and in different positions    PT Home Exercise Plan  3/10 self mobilziation to adductors and hip flexors, glute isometrics, seated lunge stretch and straddle stance, hip abd in supine.    Consulted and Agree with Plan of Care  Patient       Patient will benefit from skilled therapeutic intervention in order to improve the following deficits and impairments:  Abnormal gait, Decreased activity tolerance, Decreased balance, Decreased endurance, Decreased range of motion, Decreased strength, Difficulty walking, Increased fascial restricitons, Increased muscle spasms, Impaired perceived functional ability, Impaired flexibility, Impaired vision/preception, Improper body mechanics, Postural dysfunction, Pain  Visit Diagnosis: Difficulty in walking, not elsewhere classified  Pain in right hip  Chronic pain of right knee  Chronic bilateral low back pain without sciatica     Problem List Patient Active Problem List   Diagnosis Date Noted  . Knee pain, right 04/06/2019  . Left ankle sprain 04/06/2019  . Personal history of  colonic polyps 07/12/2018  . Ingrown right big toenail 03/23/2017  . Hip pain, chronic, right 02/03/2016  . GERD (gastroesophageal reflux disease) 12/26/2014  . Diverticulosis of colon without hemorrhage 07/05/2014  . FH: pancreatic cancer 01/26/2014  . FH: glaucoma 03/24/2013  . Overweight (BMI 25.0-29.9) 03/13/2012  . Hyperlipidemia LDL goal <100 04/03/2009  . Essential hypertension 04/03/2009    4:13 PM, 07/10/19 Jerene Pitch, DPT Physical Therapy with [facility]  [phone] office  [place] [address] [place], [place], [address] Phone: [phone]   Fax:  [phone]  Name: Adrienne [name] MRN: [id number] Date of Birth: 03-Jun-1945

## 2019-07-11 ENCOUNTER — Ambulatory Visit: Payer: Medicare Other | Attending: Internal Medicine

## 2019-07-11 DIAGNOSIS — Z23 Encounter for immunization: Secondary | ICD-10-CM

## 2019-07-11 NOTE — Progress Notes (Signed)
   Covid-19 Vaccination Clinic  Name:  Adrienne [name]    MRN: [id number] DOB: 1945-09-30  07/11/2019  [name] was observed post Covid-19 immunization for 15 minutes without incident. She was provided with Vaccine Information Sheet and instruction to access the V-Safe system.   [name] was instructed to call 911 with any severe reactions post vaccine: Marland Kitchen Difficulty breathing  . Swelling of face and throat  . A fast heartbeat  . A bad rash all over body  . Dizziness and weakness   Immunizations Administered    Name Date Dose VIS Date Route   Moderna COVID-19 Vaccine 07/11/2019 11:10 AM 0.5 mL 03/27/2019 Intramuscular   Manufacturer: Moderna   Lot: [id number]   [id number]

## 2019-07-12 ENCOUNTER — Telehealth (HOSPITAL_COMMUNITY): Payer: Self-pay | Admitting: Physical Therapy

## 2019-07-12 ENCOUNTER — Ambulatory Visit (HOSPITAL_COMMUNITY): Payer: Medicare Other | Admitting: Physical Therapy

## 2019-07-12 NOTE — Telephone Encounter (Signed)
pt cancelled appt for today 

## 2019-07-17 ENCOUNTER — Other Ambulatory Visit: Payer: Self-pay

## 2019-07-17 ENCOUNTER — Ambulatory Visit (HOSPITAL_COMMUNITY): Payer: Medicare Other | Admitting: Physical Therapy

## 2019-07-17 ENCOUNTER — Encounter (HOSPITAL_COMMUNITY): Payer: Self-pay | Admitting: Physical Therapy

## 2019-07-17 DIAGNOSIS — G8929 Other chronic pain: Secondary | ICD-10-CM | POA: Diagnosis not present

## 2019-07-17 DIAGNOSIS — M25551 Pain in right hip: Secondary | ICD-10-CM | POA: Diagnosis not present

## 2019-07-17 DIAGNOSIS — R262 Difficulty in walking, not elsewhere classified: Secondary | ICD-10-CM | POA: Diagnosis not present

## 2019-07-17 DIAGNOSIS — M545 Other chronic pain: Secondary | ICD-10-CM

## 2019-07-17 DIAGNOSIS — M25561 Pain in right knee: Secondary | ICD-10-CM | POA: Diagnosis not present

## 2019-07-17 NOTE — Patient Instructions (Signed)
.   stand in staggered stance with right leg in front and near counter for support on the left side . then bring knee forward till it is at or close to over toes then . try to pull back your hip without moving your hip or knee. . hands on butt and see if you can feel any muscle activation  . perform for 5-10 seconds then repeat as much as possible.

## 2019-07-17 NOTE — Therapy (Signed)
[place], [place], [address] Phone: [phone]   Fax:  [phone]  Physical Therapy Treatment  [name] MRN: [id number] Date of Birth: Oct 13, 1945 Referring Provider (PT): Earlie Server, MD   Encounter Date: 07/17/2019  PT End of Session - 07/17/19 1631    Visit Number  17    Number of Visits  22    Date for PT Re-Evaluation  06/25/19    Authorization Type  Primary: Medicare, Secondary: BCBS    Authorization Time Period  NEW POC DATES 06/25/19 to 07/30/19    Authorization - Visit Number  8    Authorization - Number of Visits  10    Progress Note Due on Visit  19    PT Start Time  [id number]    PT Stop Time  1544    PT Time Calculation (min)  57 min    Activity Tolerance  Patient tolerated treatment well;Patient limited by pain    Behavior During Therapy  [facility] for tasks assessed/performed       Past Medical History:  Diagnosis Date  . Allergy   . Diverticulosis 05/2009  . Hyperlipidemia   . Hypertension     Past Surgical History:  Procedure Laterality Date  . ABDOMINAL HYSTERECTOMY  1990   for fibroids ovaries remain   . CHOLECYSTECTOMY  1980  . COLONOSCOPY  05/2009   rare diverticula, small internal hemorrhoids. Next colonoscopy in 5-10 yrs.  . COLONOSCOPY N/A 10/23/2018   Procedure: COLONOSCOPY;  Surgeon: Danie Binder, MD;  [facility];  Service: Endoscopy;  Laterality: N/A;  2:00pm  . left nipple inversion bx benign  2000  . POLYPECTOMY  10/23/2018   Procedure: POLYPECTOMY;  Surgeon: Danie Binder, MD;  [facility];  Service: Endoscopy;;    There were no vitals filed for this visit.  Subjective Assessment - 07/17/19 1452    Subjective  States that after last session she was in pain and it lasted for about 3 days across her back and into the right buttocks. States that she just left it alone and then yesterday started to do some stretches. States  she feels each time she feels she is getting better. States for the first time in a while she went to the store and she actually was able to walk through the store and though she was tired she was able to do it.         [facility] PT Assessment - 07/17/19 0001      Assessment   Medical Diagnosis  R knee/hip pain, LBP    Referring Provider (PT)  Earlie Server, MD                   [facility] Adult PT Treatment/Exercise - 07/17/19 0001      Neuro Re-ed    Neuro Re-ed Details   gluteal muscle activation -seated on ischial tuberosities and lumbar extension, gradual anterior translation of body weight to unload ischial tuberosities and activate glutes- prior demo, verbal and tactile cues - improved movement but pinching pain in right hip and no gluteal activation palpated. standing in staggered stance with right leg forward, knee soft with tibial translation locked out to max amount and patient "tries to pull hips back, without actually moving hip or knee" - tolerated well - faint palpable right glute contraction noted.        Manual Therapy   Manual Therapy  Soft tissue mobilization;Joint mobilization    Manual therapy comments  all manual completed seperate from rest of interventions    Joint Mobilization  right hip AP grade II and right ilium AP to promote posterior rotation - grade II - with hip at 0 and at 90 degrees     Soft tissue mobilization  STM/TPR to right hip flexors, adductors and right abdominals - tolerated well - performed with hip bent at 90 and with it straight. STM to right psoas with contract/relax and without contract relax.               PT Education - 07/17/19 1631    Education Details  on pelvis anatomy and how certain exercises will help with treatment goals.    Person(s) Educated  Patient    Methods  Explanation    Comprehension  Verbalized understanding       PT Short Term Goals - 06/19/19 1447      PT SHORT TERM GOAL #1   Title  Pt will perform HEP  at least 2x/week to reduce pain and improve overall ability to complete ADLs.    Baseline  2/23: pt reports performing HEP each morning    Time  3    Period  Weeks    Status  Achieved    Target Date  06/04/19      PT SHORT TERM GOAL #2   Title  Pt will demonstrate lumbar AROM WFL and pain-free to improve ability to complete self care tasks, household chores, and volunteering.    Baseline  2/23: see AROM    Time  3    Period  Weeks    Status  On-going        PT Long Term Goals - 06/19/19 1449      PT LONG TERM GOAL #1   Title  Pt will demonstrate RLE strength equal to LLE to improve gait mechanics, standing tolerance, and reduce pain with funcitonal activities.    Baseline  2/23: see MMT    Time  6    Period  Weeks    Status  On-going      PT LONG TERM GOAL #2   Title  Pt will perform 5x STS in 15 sec, no UE assist, equal BLE weight-bearing and no increased R hip pain.    Baseline  2/23: 18 sec, weight-shifted to LLE, increased R hip pain    Time  6    Period  Weeks    Status  On-going      PT LONG TERM GOAL #3   Title  Pt will self report low back, hip and knee pain 5/10 at worst with standing and ambulation to indicate improvement in ability to grocery shop and navigate community.    Baseline  2/23: 8/10 at worst    Time  6    Period  Weeks    Status  On-going      PT LONG TERM GOAL #4   Title  Pt will improve FOTO score to 25% limited to indicate improvement in overall functional abilities and return to personal exercise program.    Baseline  2/23: 47% limited    Time  6    Period  Weeks    Status  On-going            Plan - 07/17/19 1618    Clinical Impression Statement  Focused on decreased right anterior muscle tension. Tolerated this very well with pain in back decreased but  pain in right distal knee reappearing. Transitioned to glute activation exercises. Seated exercise not tolerated well secondary to right hip pain but standing exercise tolerated very  well. Audible "clunking" noise noted with glute activation and light palpable glute activation noted. Improved ability to ambulate and no knee or hip pain noted end of session. Instructed patient to perform frequently between now and next session. Will follow up with this exercise next session.    Personal Factors and Comorbidities  Past/Current Experience;Time since onset of injury/illness/exacerbation    Examination-Activity Limitations  Lift;Locomotion Level;Stand;Transfers    Examination-Participation Restrictions  Church;Cleaning;Meal Prep;Volunteer    Stability/Clinical Decision Making  Stable/Uncomplicated    Rehab Potential  Good    PT Frequency  2x / week    PT Duration  Other (comment)   5 weeks   PT Treatment/Interventions  ADLs/Self Care Home Management;Aquatic Therapy;Biofeedback;Cryotherapy;Moist Heat;Traction;Ultrasound;DME Instruction;Gait training;Stair training;Functional mobility training;Therapeutic activities;Therapeutic exercise;Balance training;Neuromuscular re-education;Patient/family education;Orthotic Fit/Training;Manual techniques;Passive range of motion;Dry needling;Taping;Joint Manipulations    PT Next Visit Plan  gait mechanics - focus on hip extension, trendelberg and trunk SB. Add dry needling and aquatic therapy to treatments.hip mobilizations (lateral, PA), work on R QL/trunk lengthening, glute activation and hip extension with ambulation and in different positions    PT Home Exercise Plan  3/10 self mobilziation to adductors and hip flexors, glute isometrics, seated lunge stretch and straddle stance, hip abd in supine.; 3/23 flobility exercise    Consulted and Agree with Plan of Care  Patient       Patient will benefit from skilled therapeutic intervention in order to improve the following deficits and impairments:  Abnormal gait, Decreased activity tolerance, Decreased balance, Decreased endurance, Decreased range of motion, Decreased strength, Difficulty walking,  Increased fascial restricitons, Increased muscle spasms, Impaired perceived functional ability, Impaired flexibility, Impaired vision/preception, Improper body mechanics, Postural dysfunction, Pain  Visit Diagnosis: Difficulty in walking, not elsewhere classified  Pain in right hip  Chronic pain of right knee  Chronic bilateral low back pain without sciatica     Problem List Patient Active Problem List   Diagnosis Date Noted  . Knee pain, right 04/06/2019  . Left ankle sprain 04/06/2019  . Personal history of colonic polyps 07/12/2018  . Ingrown right big toenail 03/23/2017  . Hip pain, chronic, right 02/03/2016  . GERD (gastroesophageal reflux disease) 12/26/2014  . Diverticulosis of colon without hemorrhage 07/05/2014  . FH: pancreatic cancer 01/26/2014  . FH: glaucoma 03/24/2013  . Overweight (BMI 25.0-29.9) 03/13/2012  . Hyperlipidemia LDL goal <100 04/03/2009  . Essential hypertension 04/03/2009   4:36 PM, 07/17/19 Jerene Pitch, DPT Physical Therapy with [facility]  [phone] office  [place] [address] [place], [place], [address] Phone: [phone]   Fax:  [phone]  Name: Nathan Gopalakrishnan MRN: [id number] Date of Birth: 1945-12-08

## 2019-07-19 ENCOUNTER — Other Ambulatory Visit: Payer: Self-pay

## 2019-07-19 ENCOUNTER — Encounter (HOSPITAL_COMMUNITY): Payer: Self-pay | Admitting: Physical Therapy

## 2019-07-19 ENCOUNTER — Ambulatory Visit (HOSPITAL_COMMUNITY): Payer: Medicare Other | Admitting: Physical Therapy

## 2019-07-19 DIAGNOSIS — G8929 Other chronic pain: Secondary | ICD-10-CM

## 2019-07-19 DIAGNOSIS — M545 Other chronic pain: Secondary | ICD-10-CM

## 2019-07-19 DIAGNOSIS — M25551 Pain in right hip: Secondary | ICD-10-CM

## 2019-07-19 DIAGNOSIS — R262 Difficulty in walking, not elsewhere classified: Secondary | ICD-10-CM

## 2019-07-19 DIAGNOSIS — M25561 Pain in right knee: Secondary | ICD-10-CM

## 2019-07-19 NOTE — Therapy (Signed)
[place], [place], [address] Phone: [phone]   Fax:  [phone]  Physical Therapy Treatment  [name] MRN: [id number] Date of Birth: 1945/11/02 Referring Provider (PT): Earlie Server, MD   Encounter Date: 07/19/2019  PT End of Session - 07/19/19 1446    Visit Number  18    Number of Visits  22    Date for PT Re-Evaluation  06/25/19    Authorization Type  Primary: Medicare, Secondary: BCBS    Authorization Time Period  NEW POC DATES 06/25/19 to 07/30/19    Authorization - Visit Number  8    Authorization - Number of Visits  10    Progress Note Due on Visit  19    PT Start Time  [id number]    PT Stop Time  1540    PT Time Calculation (min)  55 min    Activity Tolerance  Patient tolerated treatment well;Patient limited by pain    Behavior During Therapy  [facility] for tasks assessed/performed       Past Medical History:  Diagnosis Date  . Allergy   . Diverticulosis 05/2009  . Hyperlipidemia   . Hypertension     Past Surgical History:  Procedure Laterality Date  . ABDOMINAL HYSTERECTOMY  1990   for fibroids ovaries remain   . CHOLECYSTECTOMY  1980  . COLONOSCOPY  05/2009   rare diverticula, small internal hemorrhoids. Next colonoscopy in 5-10 yrs.  . COLONOSCOPY N/A 10/23/2018   Procedure: COLONOSCOPY;  Surgeon: Danie Binder, MD;  [facility];  Service: Endoscopy;  Laterality: N/A;  2:00pm  . left nipple inversion bx benign  2000  . POLYPECTOMY  10/23/2018   Procedure: POLYPECTOMY;  Surgeon: Danie Binder, MD;  [facility];  Service: Endoscopy;;    There were no vitals filed for this visit.  Subjective Assessment - 07/19/19 1554    Subjective  States she feels good today. States that she has been doing the exercise and it feels like it aligns the joint, not sure what it's doing for the muscle.    Currently in Pain?  Yes    Pain Score  3     Pain  Location  Hip    Pain Orientation  Right         [facility] PT Assessment - 07/19/19 0001      Assessment   Medical Diagnosis  R knee/hip pain, LBP    Referring Provider (PT)  Earlie Server, MD                   [facility] Adult PT Treatment/Exercise - 07/19/19 0001      Neuro Re-ed    Neuro Re-ed Details   standing in staggered stance with right leg forward, knee soft with tibial translation locked out to max amount and patient "tries to pull hips back, without actually moving hip or knee" - tolerated well - faint palpable right glute contraction noted.  - reviewed and practiced throughout session with verbal and tactile cues.        Lumbar Exercises: Standing   Other Standing Lumbar Exercises  glute isometrics x5 5" holds - throughout session     Other Standing Lumbar Exercises  SB to both sides - focus on left - initially painful and limited - improved with repetition and after mobilizations with movement and STM. - visual cues and posterior support at wall  Lumbar Exercises: Seated   Other Seated Lumbar Exercises  self mobilization with lacrosse ball to right groin region       Lumbar Exercises: Sidelying   Other Sidelying Lumbar Exercises  left side lying over bosu - PT over pressure for stretch - 6 minutes      Manual Therapy   Manual Therapy  Soft tissue mobilization;Joint mobilization    Manual therapy comments  all manual completed seperate from rest of interventions    Joint Mobilization  mobilization with movement with left trunk side bending, medial glides on left lumbar grade II.     Soft tissue mobilization  STM to right QL and lumbar paraspinals with and without left SB              PT Education - 07/19/19 1552    Education Details  on anatomy, muscle dysfunction and typical response with new movements and exerciess.    Person(s) Educated  Patient    Methods  Explanation    Comprehension  Verbalized understanding       PT Short Term Goals -  06/19/19 1447      PT SHORT TERM GOAL #1   Title  Pt will perform HEP at least 2x/week to reduce pain and improve overall ability to complete ADLs.    Baseline  2/23: pt reports performing HEP each morning    Time  3    Period  Weeks    Status  Achieved    Target Date  06/04/19      PT SHORT TERM GOAL #2   Title  Pt will demonstrate lumbar AROM WFL and pain-free to improve ability to complete self care tasks, household chores, and volunteering.    Baseline  2/23: see AROM    Time  3    Period  Weeks    Status  On-going        PT Long Term Goals - 06/19/19 1449      PT LONG TERM GOAL #1   Title  Pt will demonstrate RLE strength equal to LLE to improve gait mechanics, standing tolerance, and reduce pain with funcitonal activities.    Baseline  2/23: see MMT    Time  6    Period  Weeks    Status  On-going      PT LONG TERM GOAL #2   Title  Pt will perform 5x STS in 15 sec, no UE assist, equal BLE weight-bearing and no increased R hip pain.    Baseline  2/23: 18 sec, weight-shifted to LLE, increased R hip pain    Time  6    Period  Weeks    Status  On-going      PT LONG TERM GOAL #3   Title  Pt will self report low back, hip and knee pain 5/10 at worst with standing and ambulation to indicate improvement in ability to grocery shop and navigate community.    Baseline  2/23: 8/10 at worst    Time  6    Period  Weeks    Status  On-going      PT LONG TERM GOAL #4   Title  Pt will improve FOTO score to 25% limited to indicate improvement in overall functional abilities and return to personal exercise program.    Baseline  2/23: 47% limited    Time  6    Period  Weeks    Status  On-going  Plan - 07/19/19 1556    Clinical Impression Statement  Focused on improving left side bending. This was improved with exercises and manual interventions but patient had catching and muscles spasms at times. Able to decrease these symptoms but reports of continued pain at end  of session. Pain presentation typical for patient after session with noted improvements in the following days. Improved walking noted end of session with decreased right trunk side bending during right stance phase of gait.  Will follow up with patient on pain and walking next session.    Personal Factors and Comorbidities  Past/Current Experience;Time since onset of injury/illness/exacerbation    Examination-Activity Limitations  Lift;Locomotion Level;Stand;Transfers    Examination-Participation Restrictions  Church;Cleaning;Meal Prep;Volunteer    Stability/Clinical Decision Making  Stable/Uncomplicated    Rehab Potential  Good    PT Frequency  2x / week    PT Duration  Other (comment)   5 weeks   PT Treatment/Interventions  ADLs/Self Care Home Management;Aquatic Therapy;Biofeedback;Cryotherapy;Moist Heat;Traction;Ultrasound;DME Instruction;Gait training;Stair training;Functional mobility training;Therapeutic activities;Therapeutic exercise;Balance training;Neuromuscular re-education;Patient/family education;Orthotic Fit/Training;Manual techniques;Passive range of motion;Dry needling;Taping;Joint Manipulations    PT Next Visit Plan  gait mechanics - focus on hip extension, trendelberg and trunk SB. Add dry needling and aquatic therapy to treatments.hip mobilizations (lateral, PA), work on R QL/trunk lengthening, glute activation and hip extension with ambulation and in different positions    PT Home Exercise Plan  3/10 self mobilziation to adductors and hip flexors, glute isometrics, seated lunge stretch and straddle stance, hip abd in supine.; 3/23 flobility exercise    Consulted and Agree with Plan of Care  Patient       Patient will benefit from skilled therapeutic intervention in order to improve the following deficits and impairments:  Abnormal gait, Decreased activity tolerance, Decreased balance, Decreased endurance, Decreased range of motion, Decreased strength, Difficulty walking, Increased  fascial restricitons, Increased muscle spasms, Impaired perceived functional ability, Impaired flexibility, Impaired vision/preception, Improper body mechanics, Postural dysfunction, Pain  Visit Diagnosis: Difficulty in walking, not elsewhere classified  Pain in right hip  Chronic pain of right knee  Chronic bilateral low back pain without sciatica     Problem List Patient Active Problem List   Diagnosis Date Noted  . Knee pain, right 04/06/2019  . Left ankle sprain 04/06/2019  . Personal history of colonic polyps 07/12/2018  . Ingrown right big toenail 03/23/2017  . Hip pain, chronic, right 02/03/2016  . GERD (gastroesophageal reflux disease) 12/26/2014  . Diverticulosis of colon without hemorrhage 07/05/2014  . FH: pancreatic cancer 01/26/2014  . FH: glaucoma 03/24/2013  . Overweight (BMI 25.0-29.9) 03/13/2012  . Hyperlipidemia LDL goal <100 04/03/2009  . Essential hypertension 04/03/2009    3:56 PM, 07/19/19 Jerene Pitch, DPT Physical Therapy with [facility]  [phone] office  [place] [address] [place], [place], [address] Phone: [phone]   Fax:  [phone]  Name: Adrienne [name] MRN: [id number] Date of Birth: [birth year]

## 2019-07-24 ENCOUNTER — Other Ambulatory Visit: Payer: Self-pay

## 2019-07-24 ENCOUNTER — Ambulatory Visit (HOSPITAL_COMMUNITY): Payer: Medicare Other | Admitting: Physical Therapy

## 2019-07-24 ENCOUNTER — Encounter (HOSPITAL_COMMUNITY): Payer: Self-pay | Admitting: Physical Therapy

## 2019-07-24 DIAGNOSIS — G8929 Other chronic pain: Secondary | ICD-10-CM | POA: Diagnosis not present

## 2019-07-24 DIAGNOSIS — M25551 Pain in right hip: Secondary | ICD-10-CM | POA: Diagnosis not present

## 2019-07-24 DIAGNOSIS — R262 Difficulty in walking, not elsewhere classified: Secondary | ICD-10-CM | POA: Diagnosis not present

## 2019-07-24 DIAGNOSIS — M545 Other chronic pain: Secondary | ICD-10-CM

## 2019-07-24 DIAGNOSIS — M25561 Pain in right knee: Secondary | ICD-10-CM | POA: Diagnosis not present

## 2019-07-24 NOTE — Therapy (Signed)
[place], [place], [address] Phone: [phone]   Fax:  [phone]  Physical Therapy Treatment  [name] MRN: [id number] Date of Birth: [birth year] Referring Provider (PT): Earlie Server, MD   Encounter Date: 07/24/2019  PT End of Session - 07/24/19 1448    Visit Number  19    Number of Visits  22    Date for PT Re-Evaluation  06/25/19    Authorization Type  Primary: Medicare, Secondary: BCBS    Authorization Time Period  NEW POC DATES 06/25/19 to 07/30/19    Authorization - Visit Number  9    Authorization - Number of Visits  10    Progress Note Due on Visit  19    PT Start Time  1448    PT Stop Time  1535    PT Time Calculation (min)  47 min    Activity Tolerance  Patient tolerated treatment well;Patient limited by pain    Behavior During Therapy  [facility] for tasks assessed/performed       Past Medical History:  Diagnosis Date  . Allergy   . Diverticulosis 05/2009  . Hyperlipidemia   . Hypertension     Past Surgical History:  Procedure Laterality Date  . ABDOMINAL HYSTERECTOMY  1990   for fibroids ovaries remain   . CHOLECYSTECTOMY  1980  . COLONOSCOPY  05/2009   rare diverticula, small internal hemorrhoids. Next colonoscopy in 5-10 yrs.  . COLONOSCOPY N/A 10/23/2018   Procedure: COLONOSCOPY;  Surgeon: Danie Binder, MD;  [facility];  Service: Endoscopy;  Laterality: N/A;  2:00pm  . left nipple inversion bx benign  2000  . POLYPECTOMY  10/23/2018   Procedure: POLYPECTOMY;  Surgeon: Danie Binder, MD;  [facility];  Service: Endoscopy;;    There were no vitals filed for this visit.  Subjective Assessment - 07/24/19 1548    Subjective  States she was limping this morning but she did her exercise and she is feeling better. States the weekend was good, states she hasn't been anywhere to walk. States overall she feels like her limping is getting  better. States she was on her feet more and she is walking faster. States she had a little more low back pain but it doesn't stay. States though she is getting pain she is having less intense pain and she is no longer have the pain that shoots up her leg as much and not as frequent. States overall she feels about 60-65% better since starting PT. she would like to be able to stand and walk for more then 5 minutes at time to continue to take care of her brother and stay as independent as she can.    Currently in Pain?  Yes    Pain Location  Hip    Pain Orientation  Right         [facility] PT Assessment - 07/24/19 0001      Assessment   Medical Diagnosis  R knee/hip pain, LBP    Referring Provider (PT)  Earlie Server, MD      AROM   Lumbar Flexion  0% limited no pain     Lumbar - Right Side Bend  joint line no pain just pinching    Lumbar - Left Side Bend  1 inch about joint line pinching in the right side    Lumbar - Right Rotation  pain on the right side 25%  limited    Lumbar - Left Rotation  25% limited - no pain       Strength   Right Hip Extension  4-/5                   [facility] Adult PT Treatment/Exercise - 07/24/19 0001      Lumbar Exercises: Prone   Straight Leg Raise  20 reps;3 seconds   with TRA contraction    Other Prone Lumbar Exercises  prone lying 5 minutes    Other Prone Lumbar Exercises  hamstring curls 3x12 B 5" holds              PT Education - 07/24/19 1549    Education Details  on current functional status, improvements in walking, lack of progress in [place], in plan moving forward. Reviewed HEP. Discussed and educated patient in walking program and keeping track of progress with calendar    Person(s) Educated  Patient    Methods  Explanation    Comprehension  Verbalized understanding       PT Short Term Goals - 07/24/19 1550      PT SHORT TERM GOAL #1   Title  Pt will perform HEP at least 2x/week to reduce pain and improve overall ability to  complete ADLs.    Baseline  2/23: pt reports performing HEP each morning    Time  3    Period  Weeks    Status  Achieved    Target Date  06/04/19      PT SHORT TERM GOAL #2   Title  Pt will demonstrate lumbar AROM WFL and pain-free to improve ability to complete self care tasks, household chores, and volunteering.    Baseline  3/30 see AROM    Time  3    Period  Weeks    Status  On-going        PT Long Term Goals - 07/24/19 1504      PT LONG TERM GOAL #1   Title  Pt will demonstrate RLE strength equal to LLE to improve gait mechanics, standing tolerance, and reduce pain with funcitonal activities.    Baseline  3/30 improved walking mechanics, continued standing difficulties due to pain    Time  6    Period  Weeks    Status  On-going      PT LONG TERM GOAL #2   Title  Pt will perform 5x STS in 15 sec, no UE assist, equal BLE weight-bearing and no increased R hip pain.    Baseline  3/30: 14.9 sec, weight-shifted to LLE, increased back pain breifly on 3rd rep    Time  6    Period  Weeks    Status  Achieved      PT LONG TERM GOAL #3   Title  Pt will self report low back, hip and knee pain 5/10 at worst with standing and ambulation to indicate improvement in ability to grocery shop and navigate community.    Baseline  3/30 worse pain was 8/10 (brief/tempory pain) but on average 3/10    Time  6    Period  Weeks    Status  On-going      PT LONG TERM GOAL #4   Title  Pt will improve FOTO score to 25% limited to indicate improvement in overall functional abilities and return to personal exercise program.    Baseline  2/23: 47% limited- not a good measure for patient to measure progress  Time  6    Period  Weeks    Status  Deferred      PT LONG TERM GOAL #5   Title  Patient will be able to ambulate to her brother's house (4 houses down) to demonstrate improved functional mobilty.    Time  4    Period  Weeks    Status  New    Target Date  08/21/19      Additional Long  Term Goals   Additional Long Term Goals  Yes      PT LONG TERM GOAL #6   Title  Patient will be able to stand for at least 10 minutes to improve ability to wash dishes at home.    Time  4    Period  Weeks    Status  New    Target Date  08/21/19            Plan - 07/24/19 1550    Clinical Impression Statement  Patient present for a progress note on this date and she is making progress in overall subjection report, gait mechanics and pain intensity. However, she continues to have difficulties with standing and walking which is required for her to stay independent at home and to continue to care for her brother. At this time 1 out of 2 shot term goals and 1 out of 4 long term goals have been met. Discontinued one long term as the outcome measure is not a good measure to measure change with this patient. Extending POC additional 4 weeks to focus on improving standing and walking endurance and continue to work on limitations in mobility as well as pain symptoms. Focus will be at establishing strong home program so patient can transition to HEP to continue to work toward personal long term goals. Patient would continue to benefit from skilled physical therapy to improve functional mobility and ability to care for self and her brother (who she is primary care giver for).    Personal Factors and Comorbidities  Past/Current Experience;Time since onset of injury/illness/exacerbation    Examination-Activity Limitations  Lift;Locomotion Level;Stand;Transfers    Examination-Participation Restrictions  Church;Cleaning;Meal Prep;Volunteer    Stability/Clinical Decision Making  Stable/Uncomplicated    Rehab Potential  Good    PT Frequency  2x / week    PT Duration  4 weeks    PT Treatment/Interventions  ADLs/Self Care Home Management;Aquatic Therapy;Biofeedback;Cryotherapy;Moist Heat;Traction;Ultrasound;DME Instruction;Gait training;Stair training;Functional mobility training;Therapeutic  activities;Therapeutic exercise;Balance training;Neuromuscular re-education;Patient/family education;Orthotic Fit/Training;Manual techniques;Passive range of motion;Dry needling;Taping;Joint Manipulations    PT Next Visit Plan  gait mechanics - focus on hip extension, trendelberg and trunk SB. Add dry needling and aquatic therapy to treatments.hip mobilizations (lateral, PA), work on R QL/trunk lengthening, glute activation and hip extension with ambulation and in different positions    PT Home Exercise Plan  3/10 self mobilziation to adductors and hip flexors, glute isometrics, seated lunge stretch and straddle stance, hip abd in supine.; 3/23 flobility exercise; 3/30 walking program, prone exercises    Consulted and Agree with Plan of Care  Patient       Patient will benefit from skilled therapeutic intervention in order to improve the following deficits and impairments:  Abnormal gait, Decreased activity tolerance, Decreased balance, Decreased endurance, Decreased range of motion, Decreased strength, Difficulty walking, Increased fascial restricitons, Increased muscle spasms, Impaired perceived functional ability, Impaired flexibility, Impaired vision/preception, Improper body mechanics, Postural dysfunction, Pain  Visit Diagnosis: Difficulty in walking, not elsewhere classified  Pain in right hip  Chronic pain of right knee  Chronic bilateral low back pain without sciatica     Problem List Patient Active Problem List   Diagnosis Date Noted  . Knee pain, right 04/06/2019  . Left ankle sprain 04/06/2019  . Personal history of colonic polyps 07/12/2018  . Ingrown right big toenail 03/23/2017  . Hip pain, chronic, right 02/03/2016  . GERD (gastroesophageal reflux disease) 12/26/2014  . Diverticulosis of colon without hemorrhage 07/05/2014  . FH: pancreatic cancer 01/26/2014  . FH: glaucoma 03/24/2013  . Overweight (BMI 25.0-29.9) 03/13/2012  . Hyperlipidemia LDL goal <100 04/03/2009   . Essential hypertension 04/03/2009    Eliezer Champagne 07/24/2019, 4:00 PM  [place], [place], [address] Phone: [phone]   Fax:  [phone]  Name: Adrienne [name] MRN: [id number] Date of Birth: [birth year]

## 2019-07-31 ENCOUNTER — Ambulatory Visit (HOSPITAL_COMMUNITY): Payer: Medicare Other | Attending: Orthopedic Surgery | Admitting: Physical Therapy

## 2019-07-31 ENCOUNTER — Other Ambulatory Visit: Payer: Self-pay

## 2019-07-31 ENCOUNTER — Encounter (HOSPITAL_COMMUNITY): Payer: Self-pay | Admitting: Physical Therapy

## 2019-07-31 DIAGNOSIS — M545 Other chronic pain: Secondary | ICD-10-CM

## 2019-07-31 DIAGNOSIS — M25561 Pain in right knee: Secondary | ICD-10-CM | POA: Diagnosis not present

## 2019-07-31 DIAGNOSIS — R262 Difficulty in walking, not elsewhere classified: Secondary | ICD-10-CM | POA: Diagnosis not present

## 2019-07-31 DIAGNOSIS — G8929 Other chronic pain: Secondary | ICD-10-CM | POA: Diagnosis not present

## 2019-07-31 DIAGNOSIS — M25551 Pain in right hip: Secondary | ICD-10-CM | POA: Insufficient documentation

## 2019-07-31 NOTE — Therapy (Signed)
[place], [place], [address] Phone: [phone]   Fax:  [phone]  Physical Therapy Treatment  [name] MRN: [id number] Date of Birth: 09-22-1945 Referring Provider (PT): Earlie Server, MD   Encounter Date: 07/31/2019  PT End of Session - 07/31/19 1130    Visit Number  20    Number of Visits  30    Date for PT Re-Evaluation  08/21/19    Authorization Type  Primary: Medicare, Secondary: BCBS    Authorization Time Period  NEW POC DATES 07/24/19 to 08/21/19    Progress Note Due on Visit  29    PT Start Time  1130    PT Stop Time  1210    PT Time Calculation (min)  40 min    Activity Tolerance  Patient tolerated treatment well;Patient limited by pain    Behavior During Therapy  [facility] for tasks assessed/performed       Past Medical History:  Diagnosis Date  . Allergy   . Diverticulosis 05/2009  . Hyperlipidemia   . Hypertension     Past Surgical History:  Procedure Laterality Date  . ABDOMINAL HYSTERECTOMY  1990   for fibroids ovaries remain   . CHOLECYSTECTOMY  1980  . COLONOSCOPY  05/2009   rare diverticula, small internal hemorrhoids. Next colonoscopy in 5-10 yrs.  . COLONOSCOPY N/A 10/23/2018   Procedure: COLONOSCOPY;  Surgeon: Danie Binder, MD;  [facility];  Service: Endoscopy;  Laterality: N/A;  2:00pm  . left nipple inversion bx benign  2000  . POLYPECTOMY  10/23/2018   Procedure: POLYPECTOMY;  Surgeon: Danie Binder, MD;  [facility]: [facility];  Service: Endoscopy;;    There were no vitals filed for this visit.      [facility] PT Assessment - 07/31/19 0001      Assessment   Medical Diagnosis  R knee/hip pain, LBP    Referring Provider (PT)  Earlie Server, MD                   [facility] Adult PT Treatment/Exercise - 07/31/19 0001      Lumbar Exercises: Seated   Other Seated Lumbar Exercises  seated exercise-make sure you sit on your  sit bones equally. then think about actively sitting tall (string on top of head pulling you upward). lift up through the spine to gain height in seated position and hold for 5-10 second while breathing - practiced for 10 minutes       Lumbar Exercises: Sidelying   Other Sidelying Lumbar Exercises  laying on left side with right leg straight-focus on elevating/lifting lower right ribs towards ceiling with inhale - and take right arm and press into bed/couch to assist - try to hold this position even when you exhale and with each inhale press ribs further and further toward the ceiling. repeat 5 times then relax. - practiced for 10 minutes             PT Education - 07/31/19 1144    Education Details  on HEP, on plan and new exercises today. on importance of adherance to walking program. on how/why to perform seated posture exercises with breath work (anatomy associated with it and dysfunction she presents with).    Person(s) Educated  Patient    Methods  Explanation    Comprehension  Verbalized understanding       PT Short Term Goals - 07/24/19 1550  PT SHORT TERM GOAL #1   Title  Pt will perform HEP at least 2x/week to reduce pain and improve overall ability to complete ADLs.    Baseline  2/23: pt reports performing HEP each morning    Time  3    Period  Weeks    Status  Achieved    Target Date  06/04/19      PT SHORT TERM GOAL #2   Title  Pt will demonstrate lumbar AROM WFL and pain-free to improve ability to complete self care tasks, household chores, and volunteering.    Baseline  3/30 see AROM    Time  3    Period  Weeks    Status  On-going        PT Long Term Goals - 07/24/19 1504      PT LONG TERM GOAL #1   Title  Pt will demonstrate RLE strength equal to LLE to improve gait mechanics, standing tolerance, and reduce pain with funcitonal activities.    Baseline  3/30 improved walking mechanics, continued standing difficulties due to pain    Time  6    Period   Weeks    Status  On-going      PT LONG TERM GOAL #2   Title  Pt will perform 5x STS in 15 sec, no UE assist, equal BLE weight-bearing and no increased R hip pain.    Baseline  3/30: 14.9 sec, weight-shifted to LLE, increased back pain breifly on 3rd rep    Time  6    Period  Weeks    Status  Achieved      PT LONG TERM GOAL #3   Title  Pt will self report low back, hip and knee pain 5/10 at worst with standing and ambulation to indicate improvement in ability to grocery shop and navigate community.    Baseline  3/30 worse pain was 8/10 (brief/tempory pain) but on average 3/10    Time  6    Period  Weeks    Status  On-going      PT LONG TERM GOAL #4   Title  Pt will improve FOTO score to 25% limited to indicate improvement in overall functional abilities and return to personal exercise program.    Baseline  2/23: 47% limited- not a good measure for patient to measure progress    Time  6    Period  Weeks    Status  Deferred      PT LONG TERM GOAL #5   Title  Patient will be able to ambulate to her brother's house (4 houses down) to demonstrate improved functional mobilty.    Time  4    Period  Weeks    Status  New    Target Date  08/21/19      Additional Long Term Goals   Additional Long Term Goals  Yes      PT LONG TERM GOAL #6   Title  Patient will be able to stand for at least 10 minutes to improve ability to wash dishes at home.    Time  4    Period  Weeks    Status  New    Target Date  08/21/19            Plan - 07/31/19 1243    Clinical Impression Statement  Focused on improving active rib mobility and cores strengthening in these opened active postures. Patient tolerated this well. Less pain noted with walking after performing  side lying exercises. Educated patient in how/why this exercise is beneficial.    Personal Factors and Comorbidities  Past/Current Experience;Time since onset of injury/illness/exacerbation    Examination-Activity Limitations   Lift;Locomotion Level;Stand;Transfers    Examination-Participation Restrictions  Church;Cleaning;Meal Prep;Volunteer    Stability/Clinical Decision Making  Stable/Uncomplicated    Rehab Potential  Good    PT Frequency  2x / week    PT Duration  4 weeks    PT Treatment/Interventions  ADLs/Self Care Home Management;Aquatic Therapy;Biofeedback;Cryotherapy;Moist Heat;Traction;Ultrasound;DME Instruction;Gait training;Stair training;Functional mobility training;Therapeutic activities;Therapeutic exercise;Balance training;Neuromuscular re-education;Patient/family education;Orthotic Fit/Training;Manual techniques;Passive range of motion;Dry needling;Taping;Joint Manipulations    PT Next Visit Plan  gait mechanics - focus on hip extension, trendelberg and trunk SB. Add dry needling work on R QL/trunk lengthening, glute activation and hip extension with ambulation and in different positions    PT Home Exercise Plan  3/10 self mobilziation to adductors and hip flexors, glute isometrics, seated lunge stretch and straddle stance, hip abd in supine.; 3/23 flobility exercise; 3/30 walking program, prone exercises 4/6 posture exercises, spinal lengthening and lateral rib opener in side lying    Consulted and Agree with Plan of Care  Patient       Patient will benefit from skilled therapeutic intervention in order to improve the following deficits and impairments:  Abnormal gait, Decreased activity tolerance, Decreased balance, Decreased endurance, Decreased range of motion, Decreased strength, Difficulty walking, Increased fascial restricitons, Increased muscle spasms, Impaired perceived functional ability, Impaired flexibility, Impaired vision/preception, Improper body mechanics, Postural dysfunction, Pain  Visit Diagnosis: Difficulty in walking, not elsewhere classified  Pain in right hip  Chronic pain of right knee  Chronic bilateral low back pain without sciatica     Problem List Patient Active  Problem List   Diagnosis Date Noted  . Knee pain, right 04/06/2019  . Left ankle sprain 04/06/2019  . Personal history of colonic polyps 07/12/2018  . Ingrown right big toenail 03/23/2017  . Hip pain, chronic, right 02/03/2016  . GERD (gastroesophageal reflux disease) 12/26/2014  . Diverticulosis of colon without hemorrhage 07/05/2014  . FH: pancreatic cancer 01/26/2014  . FH: glaucoma 03/24/2013  . Overweight (BMI 25.0-29.9) 03/13/2012  . Hyperlipidemia LDL goal <100 04/03/2009  . Essential hypertension 04/03/2009    12:45 PM, 07/31/19 Jerene Pitch, DPT Physical Therapy with [facility]  [phone] office  [place] [address] [place], [place], [address] Phone: [phone]   Fax:  [phone]  Name: Kalise Lamoureaux MRN: [id number] Date of Birth: 06-05-1945

## 2019-07-31 NOTE — Patient Instructions (Signed)
.   seated exercise o make sure you sit on your sit bones equally o then think about actively sitting tall (string on top of head pulling you upward) o lift up through the spine to gain height in seated position and hold for 5-10 second while breathing . laying on left side with right leg straight o focus on elevating/lifting lower right ribs towards ceiling with inhale - and take right arm and press into bed/couch to assist - try to hold this position even when you exhale and with each inhale press ribs further and further toward the ceiling. repeat 5 times then relax.

## 2019-08-01 ENCOUNTER — Other Ambulatory Visit: Payer: Self-pay

## 2019-08-01 ENCOUNTER — Encounter (HOSPITAL_COMMUNITY): Payer: Self-pay | Admitting: Physical Therapy

## 2019-08-01 ENCOUNTER — Ambulatory Visit (HOSPITAL_COMMUNITY): Payer: Medicare Other | Admitting: Physical Therapy

## 2019-08-01 DIAGNOSIS — M545 Other chronic pain: Secondary | ICD-10-CM

## 2019-08-01 DIAGNOSIS — M25551 Pain in right hip: Secondary | ICD-10-CM | POA: Diagnosis not present

## 2019-08-01 DIAGNOSIS — R262 Difficulty in walking, not elsewhere classified: Secondary | ICD-10-CM | POA: Diagnosis not present

## 2019-08-01 DIAGNOSIS — G8929 Other chronic pain: Secondary | ICD-10-CM | POA: Diagnosis not present

## 2019-08-01 DIAGNOSIS — M25561 Pain in right knee: Secondary | ICD-10-CM | POA: Diagnosis not present

## 2019-08-01 NOTE — Therapy (Signed)
[place], [place], [address] Phone: [phone]   Fax:  [phone]  Physical Therapy Treatment  [name] MRN: [id number] Date of Birth: 05-11-1945 Referring Provider (PT): Earlie Server, MD   Encounter Date: 08/01/2019  PT End of Session - 08/01/19 1530    Visit Number  21    Number of Visits  30    Date for PT Re-Evaluation  08/21/19    Authorization Type  Primary: Medicare, Secondary: BCBS    Authorization Time Period  NEW POC DATES 07/24/19 to 08/21/19    Progress Note Due on Visit  29    PT Start Time  1530    PT Stop Time  1615    PT Time Calculation (min)  45 min    Activity Tolerance  Patient tolerated treatment well;Patient limited by pain    Behavior During Therapy  [facility] for tasks assessed/performed       Past Medical History:  Diagnosis Date  . Allergy   . Diverticulosis 05/2009  . Hyperlipidemia   . Hypertension     Past Surgical History:  Procedure Laterality Date  . ABDOMINAL HYSTERECTOMY  1990   for fibroids ovaries remain   . CHOLECYSTECTOMY  1980  . COLONOSCOPY  05/2009   rare diverticula, small internal hemorrhoids. Next colonoscopy in 5-10 yrs.  . COLONOSCOPY N/A 10/23/2018   Procedure: COLONOSCOPY;  Surgeon: Danie Binder, MD;  [facility];  Service: Endoscopy;  Laterality: N/A;  2:00pm  . left nipple inversion bx benign  2000  . POLYPECTOMY  10/23/2018   Procedure: POLYPECTOMY;  Surgeon: Danie Binder, MD;  [facility];  Service: Endoscopy;;    There were no vitals filed for this visit.  Subjective Assessment - 08/01/19 1544    Subjective  States she has been walking to and from her brother's husband and each time she does it she feels better. States it takes her about 2 minutes to do. States that she has walked it 4 times since her last session. States she also feels laying on her stomach this morning with her hip extension  exercise was easier.    Currently in Pain?  Yes    Pain Score  2     Pain Location  Hip    Pain Orientation  Right         [facility] PT Assessment - 08/01/19 0001      Assessment   Medical Diagnosis  R knee/hip pain, LBP    Referring Provider (PT)  Earlie Server, MD                   [facility] Adult PT Treatment/Exercise - 08/01/19 0001      Lumbar Exercises: Standing   Other Standing Lumbar Exercises  semi single leg glute activation 3x5, 5" holds    Other Standing Lumbar Exercises  standing with equal weight on both feet, core activated and glutes activated 3x5, 5" holds      Lumbar Exercises: Sidelying   Other Sidelying Lumbar Exercises  laying on left side with right leg straight-focus on elevating/lifting lower right ribs towards ceiling with inhale - and take right arm and press into bed/couch to assist - try to hold this position even when you exhale and with each inhale press ribs further and further toward the ceiling. repeat 5 times then relax. - practiced for 6 minutes  PT Education - 08/01/19 1553    Education Details  Answered patient's questions about HEP and plan moving forward/ intensity and frequency of walking program.    Person(s) Educated  Patient    Methods  Explanation    Comprehension  Verbalized understanding       PT Short Term Goals - 07/24/19 1550      PT SHORT TERM GOAL #1   Title  Pt will perform HEP at least 2x/week to reduce pain and improve overall ability to complete ADLs.    Baseline  2/23: pt reports performing HEP each morning    Time  3    Period  Weeks    Status  Achieved    Target Date  06/04/19      PT SHORT TERM GOAL #2   Title  Pt will demonstrate lumbar AROM WFL and pain-free to improve ability to complete self care tasks, household chores, and volunteering.    Baseline  3/30 see AROM    Time  3    Period  Weeks    Status  On-going        PT Long Term Goals - 07/24/19 1504      PT LONG TERM GOAL  #1   Title  Pt will demonstrate RLE strength equal to LLE to improve gait mechanics, standing tolerance, and reduce pain with funcitonal activities.    Baseline  3/30 improved walking mechanics, continued standing difficulties due to pain    Time  6    Period  Weeks    Status  On-going      PT LONG TERM GOAL #2   Title  Pt will perform 5x STS in 15 sec, no UE assist, equal BLE weight-bearing and no increased R hip pain.    Baseline  3/30: 14.9 sec, weight-shifted to LLE, increased back pain breifly on 3rd rep    Time  6    Period  Weeks    Status  Achieved      PT LONG TERM GOAL #3   Title  Pt will self report low back, hip and knee pain 5/10 at worst with standing and ambulation to indicate improvement in ability to grocery shop and navigate community.    Baseline  3/30 worse pain was 8/10 (brief/tempory pain) but on average 3/10    Time  6    Period  Weeks    Status  On-going      PT LONG TERM GOAL #4   Title  Pt will improve FOTO score to 25% limited to indicate improvement in overall functional abilities and return to personal exercise program.    Baseline  2/23: 47% limited- not a good measure for patient to measure progress    Time  6    Period  Weeks    Status  Deferred      PT LONG TERM GOAL #5   Title  Patient will be able to ambulate to her brother's house (4 houses down) to demonstrate improved functional mobilty.    Time  4    Period  Weeks    Status  New    Target Date  08/21/19      Additional Long Term Goals   Additional Long Term Goals  Yes      PT LONG TERM GOAL #6   Title  Patient will be able to stand for at least 10 minutes to improve ability to wash dishes at home.    Time  4  Period  Weeks    Status  New    Target Date  08/21/19            Plan - 08/01/19 1530    Clinical Impression Statement  continued to focus on posture and isometric strength today. Minor pain on anterior aspect of right hip with standing glute activation with focus on  posture. This resolved with semi single leg glute activation where audible popping in hip was noted. Discussed progress and plan and continued progress to be made focusing on posture, isometric and proper muscle activation.    Personal Factors and Comorbidities  Past/Current Experience;Time since onset of injury/illness/exacerbation    Examination-Activity Limitations  Lift;Locomotion Level;Stand;Transfers    Examination-Participation Restrictions  Church;Cleaning;Meal Prep;Volunteer    Stability/Clinical Decision Making  Stable/Uncomplicated    Rehab Potential  Good    PT Frequency  2x / week    PT Duration  4 weeks    PT Treatment/Interventions  ADLs/Self Care Home Management;Aquatic Therapy;Biofeedback;Cryotherapy;Moist Heat;Traction;Ultrasound;DME Instruction;Gait training;Stair training;Functional mobility training;Therapeutic activities;Therapeutic exercise;Balance training;Neuromuscular re-education;Patient/family education;Orthotic Fit/Training;Manual techniques;Passive range of motion;Dry needling;Taping;Joint Manipulations    PT Next Visit Plan  gait mechanics - focus on hip extension, trendelberg and trunk SB. Add dry needling work on R QL/trunk lengthening, glute activation and hip extension with ambulation and in different positions    PT Home Exercise Plan  3/10 self mobilziation to adductors and hip flexors, glute isometrics, seated lunge stretch and straddle stance, hip abd in supine.; 3/23 flobility exercise; 3/30 walking program, prone exercises 4/6 posture exercises, spinal lengthening and lateral rib opener in side lying    Consulted and Agree with Plan of Care  Patient       Patient will benefit from skilled therapeutic intervention in order to improve the following deficits and impairments:  Abnormal gait, Decreased activity tolerance, Decreased balance, Decreased endurance, Decreased range of motion, Decreased strength, Difficulty walking, Increased fascial restricitons,  Increased muscle spasms, Impaired perceived functional ability, Impaired flexibility, Impaired vision/preception, Improper body mechanics, Postural dysfunction, Pain  Visit Diagnosis: Difficulty in walking, not elsewhere classified  Pain in right hip  Chronic pain of right knee  Chronic bilateral low back pain without sciatica     Problem List Patient Active Problem List   Diagnosis Date Noted  . Knee pain, right 04/06/2019  . Left ankle sprain 04/06/2019  . Personal history of colonic polyps 07/12/2018  . Ingrown right big toenail 03/23/2017  . Hip pain, chronic, right 02/03/2016  . GERD (gastroesophageal reflux disease) 12/26/2014  . Diverticulosis of colon without hemorrhage 07/05/2014  . FH: pancreatic cancer 01/26/2014  . FH: glaucoma 03/24/2013  . Overweight (BMI 25.0-29.9) 03/13/2012  . Hyperlipidemia LDL goal <100 04/03/2009  . Essential hypertension 04/03/2009   4:29 PM, 08/01/19 Jerene Pitch, DPT Physical Therapy with [facility]  [phone] office  [place] [address] [place], [place], [address] Phone: [phone]   Fax:  [phone]  Name: Adithi Popko MRN: [id number] Date of Birth: 03-22-1946

## 2019-08-06 ENCOUNTER — Encounter (HOSPITAL_COMMUNITY): Payer: Self-pay | Admitting: Physical Therapy

## 2019-08-06 ENCOUNTER — Ambulatory Visit (HOSPITAL_COMMUNITY): Payer: Medicare Other | Admitting: Physical Therapy

## 2019-08-06 ENCOUNTER — Other Ambulatory Visit: Payer: Self-pay

## 2019-08-06 DIAGNOSIS — M25551 Pain in right hip: Secondary | ICD-10-CM

## 2019-08-06 DIAGNOSIS — M545 Other chronic pain: Secondary | ICD-10-CM

## 2019-08-06 DIAGNOSIS — G8929 Other chronic pain: Secondary | ICD-10-CM

## 2019-08-06 DIAGNOSIS — R262 Difficulty in walking, not elsewhere classified: Secondary | ICD-10-CM

## 2019-08-06 DIAGNOSIS — M25561 Pain in right knee: Secondary | ICD-10-CM | POA: Diagnosis not present

## 2019-08-06 NOTE — Therapy (Signed)
[name], [place], [address] Phone: [phone]   Fax:  [phone]  Physical Therapy Treatment  [name] MRN: [id number] Date of Birth: 1945-10-07 Referring Provider (PT): Earlie Server, MD   Encounter Date: 08/06/2019  PT End of Session - 08/06/19 1620    Visit Number  22    Number of Visits  30    Date for PT Re-Evaluation  08/21/19    Authorization Type  Primary: Medicare, Secondary: BCBS    Authorization Time Period  NEW POC DATES 07/24/19 to 08/21/19    Progress Note Due on Visit  29    PT Start Time  1617    PT Stop Time  1700    PT Time Calculation (min)  43 min    Activity Tolerance  Patient tolerated treatment well;Patient limited by pain    Behavior During Therapy  [name] for tasks assessed/performed       Past Medical History:  Diagnosis Date  . Allergy   . Diverticulosis 05/2009  . Hyperlipidemia   . Hypertension     Past Surgical History:  Procedure Laterality Date  . ABDOMINAL HYSTERECTOMY  1990   for fibroids ovaries remain   . CHOLECYSTECTOMY  1980  . COLONOSCOPY  05/2009   rare diverticula, small internal hemorrhoids. Next colonoscopy in 5-10 yrs.  . COLONOSCOPY N/A 10/23/2018   Procedure: COLONOSCOPY;  Surgeon: Danie Binder, MD;  [facility];  Service: Endoscopy;  Laterality: N/A;  2:00pm  . left nipple inversion bx benign  2000  . POLYPECTOMY  10/23/2018   Procedure: POLYPECTOMY;  Surgeon: Danie Binder, MD;  [facility];  Service: Endoscopy;;    There were no vitals filed for this visit.  Subjective Assessment - 08/06/19 1705    Subjective  States she says she has been doing good. States that sometimes getting out of a chair is an effort. States that no more then usual. States that her right knee started aching a little bit. States she was using her golf ball on her quad and that really seemed to help.    Currently in Pain?   Yes    Pain Location  Hip    Pain Orientation  Right    Pain Descriptors / Indicators  Aching;Throbbing;Tightness         [facility] PT Assessment - 08/06/19 0001      Assessment   Medical Diagnosis  R knee/hip pain, LBP    Referring Provider (PT)  Earlie Server, MD                   [facility] Adult PT Treatment/Exercise - 08/06/19 0001      Lumbar Exercises: Standing   Other Standing Lumbar Exercises  standing with equal weight on both feet, core activated and glutes activated 3x5, 5" holds      Manual Therapy   Manual Therapy  Soft tissue mobilization    Manual therapy comments  all manual completed seperate from rest of interventions    Soft tissue mobilization  STM to right hip ER and abd - tolerated well        Trigger Point Dry Needling - 08/06/19 0001    Consent Given?  Yes    Education Handout Provided  Previously provided    Muscles Treated Back/Hip  Gluteus medius;Piriformis    Dry Needling Comments  2 needles in right glute med and 2 needles in right  piriformis - left in situ for 11 minutes with occassional twisting and pistoning    Gluteus Medius Response  Twitch response elicited    Piriformis Response  Twitch response elicited           PT Education - 08/06/19 1705    Education Details  on dry needling, post treatment instructions    Person(s) Educated  Patient    Methods  Explanation    Comprehension  Verbalized understanding       PT Short Term Goals - 07/24/19 1550      PT SHORT TERM GOAL #1   Title  Pt will perform HEP at least 2x/week to reduce pain and improve overall ability to complete ADLs.    Baseline  2/23: pt reports performing HEP each morning    Time  3    Period  Weeks    Status  Achieved    Target Date  06/04/19      PT SHORT TERM GOAL #2   Title  Pt will demonstrate lumbar AROM WFL and pain-free to improve ability to complete self care tasks, household chores, and volunteering.    Baseline  3/30 see AROM    Time  3     Period  Weeks    Status  On-going        PT Long Term Goals - 07/24/19 1504      PT LONG TERM GOAL #1   Title  Pt will demonstrate RLE strength equal to LLE to improve gait mechanics, standing tolerance, and reduce pain with funcitonal activities.    Baseline  3/30 improved walking mechanics, continued standing difficulties due to pain    Time  6    Period  Weeks    Status  On-going      PT LONG TERM GOAL #2   Title  Pt will perform 5x STS in 15 sec, no UE assist, equal BLE weight-bearing and no increased R hip pain.    Baseline  3/30: 14.9 sec, weight-shifted to LLE, increased back pain breifly on 3rd rep    Time  6    Period  Weeks    Status  Achieved      PT LONG TERM GOAL #3   Title  Pt will self report low back, hip and knee pain 5/10 at worst with standing and ambulation to indicate improvement in ability to grocery shop and navigate community.    Baseline  3/30 worse pain was 8/10 (brief/tempory pain) but on average 3/10    Time  6    Period  Weeks    Status  On-going      PT LONG TERM GOAL #4   Title  Pt will improve FOTO score to 25% limited to indicate improvement in overall functional abilities and return to personal exercise program.    Baseline  2/23: 47% limited- not a good measure for patient to measure progress    Time  6    Period  Weeks    Status  Deferred      PT LONG TERM GOAL #5   Title  Patient will be able to ambulate to her brother's house (4 houses down) to demonstrate improved functional mobilty.    Time  4    Period  Weeks    Status  New    Target Date  08/21/19      Additional Long Term Goals   Additional Long Term Goals  Yes      PT LONG TERM  GOAL #6   Title  Patient will be able to stand for at least 10 minutes to improve ability to wash dishes at home.    Time  4    Period  Weeks    Status  New    Target Date  08/21/19            Plan - 08/06/19 1708    Clinical Impression Statement  Added dry needling to plan of care today.  Patient tolerated this moderately well. Muscle twitches elicited with both needled muscles. No post treatment soreness noted and patient reported more popping (typically a good thing for her) after treatment with walking. Will follow up with patient next session to determine post treatment symptoms/benefits.    Personal Factors and Comorbidities  Past/Current Experience;Time since onset of injury/illness/exacerbation    Examination-Activity Limitations  Lift;Locomotion Level;Stand;Transfers    Examination-Participation Restrictions  Church;Cleaning;Meal Prep;Volunteer    Stability/Clinical Decision Making  Stable/Uncomplicated    Rehab Potential  Good    PT Frequency  2x / week    PT Duration  4 weeks    PT Treatment/Interventions  ADLs/Self Care Home Management;Aquatic Therapy;Biofeedback;Cryotherapy;Moist Heat;Traction;Ultrasound;DME Instruction;Gait training;Stair training;Functional mobility training;Therapeutic activities;Therapeutic exercise;Balance training;Neuromuscular re-education;Patient/family education;Orthotic Fit/Training;Manual techniques;Passive range of motion;Dry needling;Taping;Joint Manipulations    PT Next Visit Plan  DN as appropriate. gait mechanics - focus on hip extension, trendelberg and trunk SB. Add dry needling work on R QL/trunk lengthening, glute activation and hip extension with ambulation and in different positions    PT Home Exercise Plan  3/10 self mobilziation to adductors and hip flexors, glute isometrics, seated lunge stretch and straddle stance, hip abd in supine.; 3/23 flobility exercise; 3/30 walking program, prone exercises 4/6 posture exercises, spinal lengthening and lateral rib opener in side lying    Consulted and Agree with Plan of Care  Patient       Patient will benefit from skilled therapeutic intervention in order to improve the following deficits and impairments:  Abnormal gait, Decreased activity tolerance, Decreased balance, Decreased endurance,  Decreased range of motion, Decreased strength, Difficulty walking, Increased fascial restricitons, Increased muscle spasms, Impaired perceived functional ability, Impaired flexibility, Impaired vision/preception, Improper body mechanics, Postural dysfunction, Pain  Visit Diagnosis: Difficulty in walking, not elsewhere classified  Pain in right hip  Chronic pain of right knee  Chronic bilateral low back pain without sciatica     Problem List Patient Active Problem List   Diagnosis Date Noted  . Knee pain, right 04/06/2019  . Left ankle sprain 04/06/2019  . Personal history of colonic polyps 07/12/2018  . Ingrown right big toenail 03/23/2017  . Hip pain, chronic, right 02/03/2016  . GERD (gastroesophageal reflux disease) 12/26/2014  . Diverticulosis of colon without hemorrhage 07/05/2014  . FH: pancreatic cancer 01/26/2014  . FH: glaucoma 03/24/2013  . Overweight (BMI 25.0-29.9) 03/13/2012  . Hyperlipidemia LDL goal <100 04/03/2009  . Essential hypertension 04/03/2009    5:33 PM, 08/06/19 Jerene Pitch, DPT Physical Therapy with [facility]  [phone] office  [facility] [address] [place], [place], [address] Phone: [phone]   Fax:  [phone]  Name: Adrienne [name] MRN: [id number] Date of Birth: 1945-10-07

## 2019-08-08 ENCOUNTER — Other Ambulatory Visit: Payer: Self-pay

## 2019-08-08 ENCOUNTER — Ambulatory Visit (HOSPITAL_COMMUNITY): Payer: Medicare Other | Admitting: Physical Therapy

## 2019-08-08 DIAGNOSIS — R262 Difficulty in walking, not elsewhere classified: Secondary | ICD-10-CM | POA: Diagnosis not present

## 2019-08-08 DIAGNOSIS — M25561 Pain in right knee: Secondary | ICD-10-CM | POA: Diagnosis not present

## 2019-08-08 DIAGNOSIS — M545 Low back pain, unspecified: Secondary | ICD-10-CM

## 2019-08-08 DIAGNOSIS — G8929 Other chronic pain: Secondary | ICD-10-CM

## 2019-08-08 DIAGNOSIS — M25551 Pain in right hip: Secondary | ICD-10-CM | POA: Diagnosis not present

## 2019-08-08 NOTE — Patient Instructions (Signed)
Bent knee fall ins - laying on your back with your knees bent and your feet wide - place hands on hips to make sure you don't twist that the back. Then bring one knee in towards the table - and take two deep breathes - return to starting posiiton and repeat on other side.

## 2019-08-08 NOTE — Therapy (Signed)
[facility] [place], [place], [address] Phone: [phone]   Fax:  [phone]  Physical Therapy Treatment  [name] MRN: [id number] Date of Birth: February 28, 1946 Referring Provider (PT): Earlie Server, MD   Encounter Date: 08/08/2019  PT End of Session - 08/08/19 1220    Visit Number  23    Number of Visits  30    Date for PT Re-Evaluation  08/21/19    Authorization Type  Primary: Medicare, Secondary: BCBS    Authorization Time Period  NEW POC DATES 07/24/19 to 08/21/19    Progress Note Due on Visit  29    PT Start Time  1045    PT Stop Time  1125    PT Time Calculation (min)  40 min    Activity Tolerance  Patient tolerated treatment well;Patient limited by pain    Behavior During Therapy  [facility] for tasks assessed/performed       Past Medical History:  Diagnosis Date  . Allergy   . Diverticulosis 05/2009  . Hyperlipidemia   . Hypertension     Past Surgical History:  Procedure Laterality Date  . ABDOMINAL HYSTERECTOMY  1990   for fibroids ovaries remain   . CHOLECYSTECTOMY  1980  . COLONOSCOPY  05/2009   rare diverticula, small internal hemorrhoids. Next colonoscopy in 5-10 yrs.  . COLONOSCOPY N/A 10/23/2018   Procedure: COLONOSCOPY;  Surgeon: Danie Binder, MD;  [facility];  Service: Endoscopy;  Laterality: N/A;  2:00pm  . left nipple inversion bx benign  2000  . POLYPECTOMY  10/23/2018   Procedure: POLYPECTOMY;  Surgeon: Danie Binder, MD;  [facility];  Service: Endoscopy;;    There were no vitals filed for this visit.  Subjective Assessment - 08/08/19 1102    Subjective  States she had her needling and she may have felt better but then she had to sit and care for her sister and her hip started popping a lot. States overall she has been feeling pretty good. States late last night her lower back has been bothering her.         [facility] PT Assessment -  08/08/19 0001      Assessment   Medical Diagnosis  R knee/hip pain, LBP    Referring Provider (PT)  Earlie Server, MD                   [facility] Adult PT Treatment/Exercise - 08/08/19 0001      Lumbar Exercises: Seated   Other Seated Lumbar Exercises  seated on exercise ball chair - practiced different postures - pain noted deep in pelvis on right side     Other Seated Lumbar Exercises  seated on exercise ball- anterior/posterior/lateral tilts - 8 minutes worth of work       Lumbar Exercises: Supine   Other Supine Lumbar Exercises  bent knee fall ins 2x12 5" holds B              PT Education - 08/08/19 1222    Education Details  on exercise ball and chair, on HEP. on pelvic floor anatomy    Person(s) Educated  Patient    Methods  Explanation    Comprehension  Verbalized understanding       PT Short Term Goals - 07/24/19 1550      PT SHORT TERM GOAL #1   Title  Pt will perform HEP at least 2x/week to  reduce pain and improve overall ability to complete ADLs.    Baseline  2/23: pt reports performing HEP each morning    Time  3    Period  Weeks    Status  Achieved    Target Date  06/04/19      PT SHORT TERM GOAL #2   Title  Pt will demonstrate lumbar AROM WFL and pain-free to improve ability to complete self care tasks, household chores, and volunteering.    Baseline  3/30 see AROM    Time  3    Period  Weeks    Status  On-going        PT Long Term Goals - 07/24/19 1504      PT LONG TERM GOAL #1   Title  Pt will demonstrate RLE strength equal to LLE to improve gait mechanics, standing tolerance, and reduce pain with funcitonal activities.    Baseline  3/30 improved walking mechanics, continued standing difficulties due to pain    Time  6    Period  Weeks    Status  On-going      PT LONG TERM GOAL #2   Title  Pt will perform 5x STS in 15 sec, no UE assist, equal BLE weight-bearing and no increased R hip pain.    Baseline  3/30: 14.9 sec,  weight-shifted to LLE, increased back pain breifly on 3rd rep    Time  6    Period  Weeks    Status  Achieved      PT LONG TERM GOAL #3   Title  Pt will self report low back, hip and knee pain 5/10 at worst with standing and ambulation to indicate improvement in ability to grocery shop and navigate community.    Baseline  3/30 worse pain was 8/10 (brief/tempory pain) but on average 3/10    Time  6    Period  Weeks    Status  On-going      PT LONG TERM GOAL #4   Title  Pt will improve FOTO score to 25% limited to indicate improvement in overall functional abilities and return to personal exercise program.    Baseline  2/23: 47% limited- not a good measure for patient to measure progress    Time  6    Period  Weeks    Status  Deferred      PT LONG TERM GOAL #5   Title  Patient will be able to ambulate to her brother's house (4 houses down) to demonstrate improved functional mobilty.    Time  4    Period  Weeks    Status  New    Target Date  08/21/19      Additional Long Term Goals   Additional Long Term Goals  Yes      PT LONG TERM GOAL #6   Title  Patient will be able to stand for at least 10 minutes to improve ability to wash dishes at home.    Time  4    Period  Weeks    Status  New    Target Date  08/21/19            Plan - 08/08/19 1225    Clinical Impression Statement  Patient tolerated session moderately well. Minor increase in pain (though pain was different than normal pain) deep in pelvis on right side when sititng on exercise ball. Transitioned to bend knee fall ins and this stretched the front of her hip and  the back of her hip. With return to sitting on exercise ball, pain was decreased but still present. Instructed patient  to add bent knee falls ins to HEP. Will investigate deep pelvic musculature as possible contributer to back and hip pain.    Personal Factors and Comorbidities  Past/Current Experience;Time since onset of injury/illness/exacerbation     Examination-Activity Limitations  Lift;Locomotion Level;Stand;Transfers    Examination-Participation Restrictions  Church;Cleaning;Meal Prep;Volunteer    Stability/Clinical Decision Making  Stable/Uncomplicated    Rehab Potential  Good    PT Frequency  2x / week    PT Duration  4 weeks    PT Treatment/Interventions  ADLs/Self Care Home Management;Aquatic Therapy;Biofeedback;Cryotherapy;Moist Heat;Traction;Ultrasound;DME Instruction;Gait training;Stair training;Functional mobility training;Therapeutic activities;Therapeutic exercise;Balance training;Neuromuscular re-education;Patient/family education;Orthotic Fit/Training;Manual techniques;Passive range of motion;Dry needling;Taping;Joint Manipulations    PT Next Visit Plan  seated ball execises, DN as appropriate. gait mechanics - focus on hip extension, trendelberg and trunk SB. Add dry needling work on R QL/trunk lengthening, glute activation and hip extension with ambulation and in different positions    PT Home Exercise Plan  3/10 self mobilziation to adductors and hip flexors, glute isometrics, seated lunge stretch and straddle stance, hip abd in supine.; 3/23 flobility exercise; 3/30 walking program, prone exercises 4/6 posture exercises, spinal lengthening and lateral rib opener in side lying    Consulted and Agree with Plan of Care  Patient       Patient will benefit from skilled therapeutic intervention in order to improve the following deficits and impairments:  Abnormal gait, Decreased activity tolerance, Decreased balance, Decreased endurance, Decreased range of motion, Decreased strength, Difficulty walking, Increased fascial restricitons, Increased muscle spasms, Impaired perceived functional ability, Impaired flexibility, Impaired vision/preception, Improper body mechanics, Postural dysfunction, Pain  Visit Diagnosis: Difficulty in walking, not elsewhere classified  Pain in right hip  Chronic pain of right knee  Chronic bilateral  low back pain without sciatica     Problem List Patient Active Problem List   Diagnosis Date Noted  . Knee pain, right 04/06/2019  . Left ankle sprain 04/06/2019  . Personal history of colonic polyps 07/12/2018  . Ingrown right big toenail 03/23/2017  . Hip pain, chronic, right 02/03/2016  . GERD (gastroesophageal reflux disease) 12/26/2014  . Diverticulosis of colon without hemorrhage 07/05/2014  . FH: pancreatic cancer 01/26/2014  . FH: glaucoma 03/24/2013  . Overweight (BMI 25.0-29.9) 03/13/2012  . Hyperlipidemia LDL goal <100 04/03/2009  . Essential hypertension 04/03/2009    12:27 PM, 08/08/19 Jerene Pitch, DPT Physical Therapy with [facility]  [phone] office  [facility] [address] [place], [place], [address] Phone: [phone]   Fax:  [phone]  Name: Adrienne [name] MRN: [id number] Date of Birth: [birth year]

## 2019-08-09 DIAGNOSIS — H40013 Open angle with borderline findings, low risk, bilateral: Secondary | ICD-10-CM | POA: Diagnosis not present

## 2019-08-13 ENCOUNTER — Other Ambulatory Visit: Payer: Self-pay

## 2019-08-13 ENCOUNTER — Encounter (HOSPITAL_COMMUNITY): Payer: Self-pay | Admitting: Physical Therapy

## 2019-08-13 ENCOUNTER — Ambulatory Visit (HOSPITAL_COMMUNITY): Payer: Medicare Other | Admitting: Physical Therapy

## 2019-08-13 DIAGNOSIS — M25561 Pain in right knee: Secondary | ICD-10-CM

## 2019-08-13 DIAGNOSIS — G8929 Other chronic pain: Secondary | ICD-10-CM

## 2019-08-13 DIAGNOSIS — M545 Other chronic pain: Secondary | ICD-10-CM

## 2019-08-13 DIAGNOSIS — M25551 Pain in right hip: Secondary | ICD-10-CM | POA: Diagnosis not present

## 2019-08-13 DIAGNOSIS — R262 Difficulty in walking, not elsewhere classified: Secondary | ICD-10-CM

## 2019-08-13 NOTE — Therapy (Signed)
Adrienne [name], [place], [address] Phone: [phone]   Fax:  [phone]  Physical Therapy Treatment  [name] MRN: [id number] Date of Birth: 07-16-1945 Referring Provider (PT): Adrienne Server, MD   Encounter Date: 08/13/2019  PT End of Session - 08/13/19 1537    Visit Number  24    Number of Visits  30    Date for PT Re-Evaluation  08/21/19    Authorization Type  Primary: Medicare, Secondary: BCBS    Authorization Time Period  NEW POC DATES 07/24/19 to 08/21/19    Progress Note Due on Visit  29    PT Start Time  1530    PT Stop Time  1610    PT Time Calculation (min)  40 min    Activity Tolerance  Patient tolerated treatment well;Patient limited by pain    Behavior During Therapy  [facility] for tasks assessed/performed       Past Medical History:  Diagnosis Date  . Allergy   . Diverticulosis 05/2009  . Hyperlipidemia   . Hypertension     Past Surgical History:  Procedure Laterality Date  . ABDOMINAL HYSTERECTOMY  1990   for fibroids ovaries remain   . CHOLECYSTECTOMY  1980  . COLONOSCOPY  05/2009   rare diverticula, small internal hemorrhoids. Next colonoscopy in 5-10 yrs.  . COLONOSCOPY N/A 10/23/2018   Procedure: COLONOSCOPY;  Surgeon: Danie Binder, MD;  [facility];  Service: Endoscopy;  Laterality: N/A;  2:00pm  . left nipple inversion bx benign  2000  . POLYPECTOMY  10/23/2018   Procedure: POLYPECTOMY;  Surgeon: Danie Binder, MD;  [facility];  Service: Endoscopy;;    There were no vitals filed for this visit.  Subjective Assessment - 08/13/19 1604    Subjective  States she has been really busy and she has not been able to do her exercises like she should. States she is normally really busy but she would like to be able to do her exercises more. States she felt more aggravated this weekend then normal but she thinks it is the weather. States she  has not walked to her brothers place because she has been so busy. States she did walk through the store states she felt like she did well with that.         [facility] PT Assessment - 08/13/19 0001      Assessment   Medical Diagnosis  R knee/hip pain, LBP    Referring Provider (PT)  Adrienne Server, MD                   [facility] Adult PT Treatment/Exercise - 08/13/19 0001      Lumbar Exercises: Seated   Other Seated Lumbar Exercises  seated on exercise ball posture holds - alternating heel raises 3x12, 5" holds    Other Seated Lumbar Exercises  seated on exercise ball- anterior/posterior/lateral tilts - 8 minutes worth of work              PT Education - 08/13/19 1606    Education Details  on nutritional benefits - performed antioxidant screen and nutritional deficiency screen.    Person(s) Educated  Patient    Methods  Explanation    Comprehension  Verbalized understanding       PT Short Term Goals - 07/24/19 1550      PT SHORT TERM GOAL #1   Title  Pt will  perform HEP at least 2x/week to reduce pain and improve overall ability to complete ADLs.    Baseline  2/23: pt reports performing HEP each morning    Time  3    Period  Weeks    Status  Achieved    Target Date  06/04/19      PT SHORT TERM GOAL #2   Title  Pt will demonstrate lumbar AROM WFL and pain-free to improve ability to complete self care tasks, household chores, and volunteering.    Baseline  3/30 see AROM    Time  3    Period  Weeks    Status  On-going        PT Long Term Goals - 07/24/19 1504      PT LONG TERM GOAL #1   Title  Pt will demonstrate RLE strength equal to LLE to improve gait mechanics, standing tolerance, and reduce pain with funcitonal activities.    Baseline  3/30 improved walking mechanics, continued standing difficulties due to pain    Time  6    Period  Weeks    Status  On-going      PT LONG TERM GOAL #2   Title  Pt will perform 5x STS in 15 sec, no UE assist, equal  BLE weight-bearing and no increased R hip pain.    Baseline  3/30: 14.9 sec, weight-shifted to LLE, increased back pain breifly on 3rd rep    Time  6    Period  Weeks    Status  Achieved      PT LONG TERM GOAL #3   Title  Pt will self report low back, hip and knee pain 5/10 at worst with standing and ambulation to indicate improvement in ability to grocery shop and navigate community.    Baseline  3/30 worse pain was 8/10 (brief/tempory pain) but on average 3/10    Time  6    Period  Weeks    Status  On-going      PT LONG TERM GOAL #4   Title  Pt will improve FOTO score to 25% limited to indicate improvement in overall functional abilities and return to personal exercise program.    Baseline  2/23: 47% limited- not a good measure for patient to measure progress    Time  6    Period  Weeks    Status  Deferred      PT LONG TERM GOAL #5   Title  Patient will be able to ambulate to her brother's house (4 houses down) to demonstrate improved functional mobilty.    Time  4    Period  Weeks    Status  New    Target Date  08/21/19      Additional Long Term Goals   Additional Long Term Goals  Yes      PT LONG TERM GOAL #6   Title  Patient will be able to stand for at least 10 minutes to improve ability to wash dishes at home.    Time  4    Period  Weeks    Status  New    Target Date  08/21/19            Plan - 08/13/19 1617    Clinical Impression Statement  States that she felt good end of session, not having the same pain she normally feels, but did have muscle soreness in regards to muscles working after sitting on exercise ball. Performed nutritional screen on patient  for possible nutrient deficiencies and antioxidant screen. Discussed how nutritional screen is just a screen and about possible benefits about updating diet in regards to physical symptoms. Patient to continue to benefit from skilled physical therapy at this time.    Personal Factors and Comorbidities   Past/Current Experience;Time since onset of injury/illness/exacerbation    Examination-Activity Limitations  Lift;Locomotion Level;Stand;Transfers    Examination-Participation Restrictions  Church;Cleaning;Meal Prep;Volunteer    Stability/Clinical Decision Making  Stable/Uncomplicated    Rehab Potential  Good    PT Frequency  2x / week    PT Duration  4 weeks    PT Treatment/Interventions  ADLs/Self Care Home Management;Aquatic Therapy;Biofeedback;Cryotherapy;Moist Heat;Traction;Ultrasound;DME Instruction;Gait training;Stair training;Functional mobility training;Therapeutic activities;Therapeutic exercise;Balance training;Neuromuscular re-education;Patient/family education;Orthotic Fit/Training;Manual techniques;Passive range of motion;Dry needling;Taping;Joint Manipulations    PT Next Visit Plan  nutritional screen. seated ball execises, DN as appropriate. gait mechanics - focus on hip extension, trendelberg and trunk SB. Add dry needling work on R QL/trunk lengthening, glute activation and hip extension with ambulation and in different positions    PT Home Exercise Plan  3/10 self mobilziation to adductors and hip flexors, glute isometrics, seated lunge stretch and straddle stance, hip abd in supine.; 3/23 flobility exercise; 3/30 walking program, prone exercises 4/6 posture exercises, spinal lengthening and lateral rib opener in side lying    Consulted and Agree with Plan of Care  Patient       Patient will benefit from skilled therapeutic intervention in order to improve the following deficits and impairments:  Abnormal gait, Decreased activity tolerance, Decreased balance, Decreased endurance, Decreased range of motion, Decreased strength, Difficulty walking, Increased fascial restricitons, Increased muscle spasms, Impaired perceived functional ability, Impaired flexibility, Impaired vision/preception, Improper body mechanics, Postural dysfunction, Pain  Visit Diagnosis: Difficulty in walking,  not elsewhere classified  Pain in right hip  Chronic pain of right knee  Chronic bilateral low back pain without sciatica     Problem List Patient Active Problem List   Diagnosis Date Noted  . Knee pain, right 04/06/2019  . Left ankle sprain 04/06/2019  . Personal history of colonic polyps 07/12/2018  . Ingrown right big toenail 03/23/2017  . Hip pain, chronic, right 02/03/2016  . GERD (gastroesophageal reflux disease) 12/26/2014  . Diverticulosis of colon without hemorrhage 07/05/2014  . FH: pancreatic cancer 01/26/2014  . FH: glaucoma 03/24/2013  . Overweight (BMI 25.0-29.9) 03/13/2012  . Hyperlipidemia LDL goal <100 04/03/2009  . Essential hypertension 04/03/2009   4:17 PM, 08/13/19 Adrienne [name], DPT Physical Therapy with [facility]  [phone] office  [place] [address] [place], [place], [address] Phone: [phone]   Fax:  [phone]  Name: Adrienne [name] MRN: [id number] Date of Birth: Mar 08, 1946

## 2019-08-15 ENCOUNTER — Ambulatory Visit (HOSPITAL_COMMUNITY): Payer: Medicare Other | Admitting: Physical Therapy

## 2019-08-15 ENCOUNTER — Other Ambulatory Visit: Payer: Self-pay

## 2019-08-15 ENCOUNTER — Encounter (HOSPITAL_COMMUNITY): Payer: Self-pay | Admitting: Physical Therapy

## 2019-08-15 DIAGNOSIS — M25561 Pain in right knee: Secondary | ICD-10-CM | POA: Diagnosis not present

## 2019-08-15 DIAGNOSIS — M25551 Pain in right hip: Secondary | ICD-10-CM | POA: Diagnosis not present

## 2019-08-15 DIAGNOSIS — G8929 Other chronic pain: Secondary | ICD-10-CM | POA: Diagnosis not present

## 2019-08-15 DIAGNOSIS — M545 Other chronic pain: Secondary | ICD-10-CM

## 2019-08-15 DIAGNOSIS — R262 Difficulty in walking, not elsewhere classified: Secondary | ICD-10-CM

## 2019-08-15 NOTE — Therapy (Signed)
[place], [place], [address] Phone: [phone]   Fax:  [phone]  Physical Therapy Treatment  [name] MRN: [id number] Date of Birth: December 26, 1945 Referring Provider (PT): Earlie Server, MD   Encounter Date: 08/15/2019  PT End of Session - 08/15/19 1625    Visit Number  25    Number of Visits  30    Date for PT Re-Evaluation  08/21/19    Authorization Type  Primary: Medicare, Secondary: BCBS    Authorization Time Period  NEW POC DATES 07/24/19 to 08/21/19    Progress Note Due on Visit  29    PT Start Time  1616    PT Stop Time  1656    PT Time Calculation (min)  40 min    Activity Tolerance  Patient tolerated treatment well;Patient limited by pain    Behavior During Therapy  [facility] for tasks assessed/performed       Past Medical History:  Diagnosis Date  . Allergy   . Diverticulosis 05/2009  . Hyperlipidemia   . Hypertension     Past Surgical History:  Procedure Laterality Date  . ABDOMINAL HYSTERECTOMY  1990   for fibroids ovaries remain   . CHOLECYSTECTOMY  1980  . COLONOSCOPY  05/2009   rare diverticula, small internal hemorrhoids. Next colonoscopy in 5-10 yrs.  . COLONOSCOPY N/A 10/23/2018   Procedure: COLONOSCOPY;  Surgeon: Danie Binder, MD;  [facility];  Service: Endoscopy;  Laterality: N/A;  2:00pm  . left nipple inversion bx benign  2000  . POLYPECTOMY  10/23/2018   Procedure: POLYPECTOMY;  Surgeon: Danie Binder, MD;  [facility];  Service: Endoscopy;;    There were no vitals filed for this visit.  Subjective Assessment - 08/15/19 1725    Subjective  States her right knee has been bothering her more and different from before. States that sometimes it hurts when she is in bed. States it is worse in the morning and sometimes it is a struggle to get up. States she feels her pain upon awakening but it resolves with motion and as the day  progresses. Reports she is sleeping on her back and right side, which previously she wasn't doing.         [facility] PT Assessment - 08/15/19 0001      Observation/Other Assessments   Observations  swelling at knee joint line 35.8cm L,  36.8 cm R                    [facility] Adult PT Treatment/Exercise - 08/15/19 0001      Lumbar Exercises: Seated   Other Seated Lumbar Exercises  reviewed HEP and exercises for knee     Other Seated Lumbar Exercises  seated on exercise ball- anterior/posterior/lateral tilts -5 minutes worth of work              PT Education - 08/15/19 1729    Education Details  Educated patient on swelling in knee, and ways to decrease it (elevation, compression stockings). Discussed and educated patient on nutrient findings and focused on foods that could help supplement her diet along with factors that may decrease nutrient absorptions. Answered all questions about nutrient findings and about swelling.    Person(s) Educated  Patient    Methods  Explanation    Comprehension  Verbalized understanding       PT Short Term Goals - 07/24/19 1550  PT SHORT TERM GOAL #1   Title  Pt will perform HEP at least 2x/week to reduce pain and improve overall ability to complete ADLs.    Baseline  2/23: pt reports performing HEP each morning    Time  3    Period  Weeks    Status  Achieved    Target Date  06/04/19      PT SHORT TERM GOAL #2   Title  Pt will demonstrate lumbar AROM WFL and pain-free to improve ability to complete self care tasks, household chores, and volunteering.    Baseline  3/30 see AROM    Time  3    Period  Weeks    Status  On-going        PT Long Term Goals - 07/24/19 1504      PT LONG TERM GOAL #1   Title  Pt will demonstrate RLE strength equal to LLE to improve gait mechanics, standing tolerance, and reduce pain with funcitonal activities.    Baseline  3/30 improved walking mechanics, continued standing difficulties due to pain     Time  6    Period  Weeks    Status  On-going      PT LONG TERM GOAL #2   Title  Pt will perform 5x STS in 15 sec, no UE assist, equal BLE weight-bearing and no increased R hip pain.    Baseline  3/30: 14.9 sec, weight-shifted to LLE, increased back pain breifly on 3rd rep    Time  6    Period  Weeks    Status  Achieved      PT LONG TERM GOAL #3   Title  Pt will self report low back, hip and knee pain 5/10 at worst with standing and ambulation to indicate improvement in ability to grocery shop and navigate community.    Baseline  3/30 worse pain was 8/10 (brief/tempory pain) but on average 3/10    Time  6    Period  Weeks    Status  On-going      PT LONG TERM GOAL #4   Title  Pt will improve FOTO score to 25% limited to indicate improvement in overall functional abilities and return to personal exercise program.    Baseline  2/23: 47% limited- not a good measure for patient to measure progress    Time  6    Period  Weeks    Status  Deferred      PT LONG TERM GOAL #5   Title  Patient will be able to ambulate to her brother's house (4 houses down) to demonstrate improved functional mobilty.    Time  4    Period  Weeks    Status  New    Target Date  08/21/19      Additional Long Term Goals   Additional Long Term Goals  Yes      PT LONG TERM GOAL #6   Title  Patient will be able to stand for at least 10 minutes to improve ability to wash dishes at home.    Time  4    Period  Weeks    Status  New    Target Date  08/21/19            Plan - 08/15/19 1712    Clinical Impression Statement  Reviewed nutrition findings, vitamin D with most symptoms consistent with vitamin D deficiency. Patient is already taking multi vitamin with vitamin D. Discussed if patient  is concerned to follow up with her MD if she feels food sources for diet supplementation are not adequate. Reviewed home program. Discussed and educated patient on possible reasons for swelling as well as ways to  combat it. Patient is doing well and is confident in HEP. Anticipate discharge from PT next session pending patient presentation.    Personal Factors and Comorbidities  Past/Current Experience;Time since onset of injury/illness/exacerbation    Examination-Activity Limitations  Lift;Locomotion Level;Stand;Transfers    Examination-Participation Restrictions  Church;Cleaning;Meal Prep;Volunteer    Stability/Clinical Decision Making  Stable/Uncomplicated    Rehab Potential  Good    PT Frequency  2x / week    PT Duration  4 weeks    PT Treatment/Interventions  ADLs/Self Care Home Management;Aquatic Therapy;Biofeedback;Cryotherapy;Moist Heat;Traction;Ultrasound;DME Instruction;Gait training;Stair training;Functional mobility training;Therapeutic activities;Therapeutic exercise;Balance training;Neuromuscular re-education;Patient/family education;Orthotic Fit/Training;Manual techniques;Passive range of motion;Dry needling;Taping;Joint Manipulations    PT Next Visit Plan  DC as able.  seated ball execises, DN as appropriate. gait mechanics - focus on hip extension, trendelberg and trunk SB. Add dry needling work on R QL/trunk lengthening, glute activation and hip extension with ambulation and in different positions    PT Home Exercise Plan  3/10 self mobilziation to adductors and hip flexors, glute isometrics, seated lunge stretch and straddle stance, hip abd in supine.; 3/23 flobility exercise; 3/30 walking program, prone exercises 4/6 posture exercises, spinal lengthening and lateral rib opener in side lying    Consulted and Agree with Plan of Care  Patient       Patient will benefit from skilled therapeutic intervention in order to improve the following deficits and impairments:  Abnormal gait, Decreased activity tolerance, Decreased balance, Decreased endurance, Decreased range of motion, Decreased strength, Difficulty walking, Increased fascial restricitons, Increased muscle spasms, Impaired perceived  functional ability, Impaired flexibility, Impaired vision/preception, Improper body mechanics, Postural dysfunction, Pain  Visit Diagnosis: Difficulty in walking, not elsewhere classified  Pain in right hip  Chronic pain of right knee  Chronic bilateral low back pain without sciatica     Problem List Patient Active Problem List   Diagnosis Date Noted  . Knee pain, right 04/06/2019  . Left ankle sprain 04/06/2019  . Personal history of colonic polyps 07/12/2018  . Ingrown right big toenail 03/23/2017  . Hip pain, chronic, right 02/03/2016  . GERD (gastroesophageal reflux disease) 12/26/2014  . Diverticulosis of colon without hemorrhage 07/05/2014  . FH: pancreatic cancer 01/26/2014  . FH: glaucoma 03/24/2013  . Overweight (BMI 25.0-29.9) 03/13/2012  . Hyperlipidemia LDL goal <100 04/03/2009  . Essential hypertension 04/03/2009   5:34 PM, 08/15/19 Jerene Pitch, DPT Physical Therapy with [facility]  [phone] office  [place] [address] [place], [place], [address] Phone: [phone]   Fax:  [phone]  Name: Adrienne [name] MRN: [id number] Date of Birth: 07/24/1945

## 2019-08-20 ENCOUNTER — Telehealth (INDEPENDENT_AMBULATORY_CARE_PROVIDER_SITE_OTHER): Payer: Medicare Other

## 2019-08-20 ENCOUNTER — Other Ambulatory Visit: Payer: Self-pay

## 2019-08-20 VITALS — BP 130/78 | Ht 65.5 in | Wt 164.0 lb

## 2019-08-20 DIAGNOSIS — Z0000 Encounter for general adult medical examination without abnormal findings: Secondary | ICD-10-CM | POA: Diagnosis not present

## 2019-08-20 NOTE — Progress Notes (Signed)
Subjective:   Adrienne [name] is a 74 y.o. female who presents for Medicare Annual (Subsequent) preventive examination.  Review of Systems:   Cardiac Risk Factors include: advanced age (>48men, >55 women);dyslipidemia;hypertension     Objective:     Vitals: BP 130/78   Ht 5' 5.5" (1.664 m)   Wt 164 lb (74.4 kg)   BMI 26.88 kg/m   Body mass index is 26.88 kg/m.  Advanced Directives 08/20/2019 05/14/2019 10/23/2018 08/15/2017 07/28/2016  Does Patient Have a Medical Advance Directive? No No No No No  Would patient like information on creating a medical advance directive? No - Patient declined No - Patient declined No - Patient declined No - Patient declined Yes (MAU/Ambulatory/Procedural Areas - Information given)    Tobacco Social History   Tobacco Use  Smoking Status Never Smoker  Smokeless Tobacco Never Used     Counseling given: Not Answered   Clinical Intake:  Pre-visit preparation completed: No  Pain : No/denies pain Pain Score: 0-No pain     Nutritional Status: BMI of 19-24  Normal Diabetes: No  How often do you need to have someone help you when you read instructions, pamphlets, or other written materials from your doctor or pharmacy?: 1 - Never  Interpreter Needed?: No  Information entered by :: Saraiyah Hemminger LPN  Past Medical History:  Diagnosis Date  . Allergy   . Diverticulosis 05/2009  . Hyperlipidemia   . Hypertension    Past Surgical History:  Procedure Laterality Date  . ABDOMINAL HYSTERECTOMY  1990   for fibroids ovaries remain   . CHOLECYSTECTOMY  1980  . COLONOSCOPY  05/2009   rare diverticula, small internal hemorrhoids. Next colonoscopy in 5-10 yrs.  . COLONOSCOPY N/A 10/23/2018   Procedure: COLONOSCOPY;  Surgeon: Danie Binder, MD;  [facility];  Service: Endoscopy;  Laterality: N/A;  2:00pm  . left nipple inversion bx benign  2000  . POLYPECTOMY  10/23/2018   Procedure: POLYPECTOMY;  Surgeon: Danie Binder, MD;   Location: [facility];  Service: Endoscopy;;   Family History  Problem Relation Age of Onset  . Kidney failure Father   . Arthritis Father   . Heart disease Sister   . Hypertension Sister   . Arthritis Sister   . Breast cancer Sister   . Diabetes Sister   . Arthritis Sister   . Lung cancer Brother        was a smoker  . Arthritis Brother   . Arthritis Mother   . Prostate cancer Brother   . Colon cancer Neg Hx    Social History   Socioeconomic History  . Marital status: Single    Spouse name: Not on file  . Number of children: 0  . Years of education: Not on file  . Highest education level: Not on file  Occupational History  . Occupation: retired    Fish farm manager: RETIRED  Tobacco Use  . Smoking status: Never Smoker  . Smokeless tobacco: Never Used  Substance and Sexual Activity  . Alcohol use: Yes    Comment: rarely   . Drug use: No  . Sexual activity: Not Currently    Birth control/protection: Surgical    Comment: not asked  Other Topics Concern  . Not on file  Social History Narrative  . Not on file   Social Determinants of Health   Financial Resource Strain: Low Risk   . Difficulty of Paying Living Expenses: Not hard at all  Food Insecurity:   .  Worried About Charity fundraiser in the Last Year:   . Arboriculturist in the Last Year:   Transportation Needs: No Transportation Needs  . Lack of Transportation (Medical): No  . Lack of Transportation (Non-Medical): No  Physical Activity: Sufficiently Active  . Days of Exercise per Week: 6 days  . Minutes of Exercise per Session: 40 min  Stress:   . Feeling of Stress :   Social Connections: Somewhat Isolated  . Frequency of Communication with Friends and Family: More than three times a week  . Frequency of Social Gatherings with Friends and Family: More than three times a week  . Attends Religious Services: More than 4 times per year  . Active Member of Clubs or Organizations: No  . Attends Theatre manager Meetings: Never  . Marital Status: Never married    Outpatient Encounter Medications as of 08/20/2019  Medication Sig  . amLODipine (NORVASC) 2.5 MG tablet Take 1 tablet by mouth once daily  . Biotin 10 MG CAPS Take 10 mg by mouth every evening.  Maudry Mayhew, Angelica sinensis, (DONG QUAI PO) Take 1 tablet by mouth daily.   Marland Kitchen loratadine (CLARITIN) 10 MG tablet Take 10 mg by mouth daily as needed for allergies.  . Menthol-Methyl Salicylate (MUSCLE RUB) 10-15 % CREA Apply 1 application topically as needed for muscle pain.  . Multiple Minerals-Vitamins (CALCIUM-MAGNESIUM-ZINC-D3) TABS Take 3 tablets by mouth every evening.  . Multiple Vitamin (MULTIVITAMIN) tablet Take 1 tablet by mouth every evening. Take 1/2 by mouth in the AM and half at night  . naproxen sodium (ANAPROX) 220 MG tablet Take 220-440 mg by mouth 2 (two) times daily with a meal.   . triamterene-hydrochlorothiazide (MAXZIDE-25) 37.5-25 MG tablet Take 1 tablet by mouth daily.  . TURMERIC PO Take 450 mg by mouth every evening.   No facility-administered encounter medications on file as of 08/20/2019.    Activities of Daily Living In your present state of health, do you have any difficulty performing the following activities: 08/20/2019  Hearing? N  Vision? N  Difficulty concentrating or making decisions? N  Walking or climbing stairs? N  Dressing or bathing? N  Doing errands, shopping? N  Preparing Food and eating ? N  Using the Toilet? N  In the past six months, have you accidently leaked urine? N  Do you have problems with loss of bowel control? N  Managing your Medications? N  Managing your Finances? N  Housekeeping or managing your Housekeeping? N  Some recent data might be hidden    Patient Care Team: Fayrene Helper, MD as PCP - General    Assessment:   This is a routine wellness examination for [place].  Exercise Activities and Dietary recommendations Current Exercise Habits: Structured  exercise class, Type of exercise: stretching, Time (Minutes): 40, Frequency (Times/Week): 6, Weekly Exercise (Minutes/Week): 240, Intensity: Mild, Exercise limited by: orthopedic condition(s)  Goals    . DIET - INCREASE WATER INTAKE     Increase water intake     . Exercise 3x per week (30 min per time)     Patient would like to start a more routine exercise program and start using an eliptical machine    . LIFESTYLE - DECREASE FALLS RISK       Fall Risk Fall Risk  08/20/2019 04/03/2019 08/17/2018 03/28/2018 08/15/2017  Falls in the past year? 0 1 0 0 No  Number falls in past yr: 0 0 - - -  Injury with Fall? 0 1 0 - -  Risk Factor Category  - - - - -   Is the patient's home free of loose throw rugs in walkways, pet beds, electrical cords, etc?   yes      Grab bars in the bathroom? yes      Handrails on the stairs?   yes      Adequate lighting?   yes  Timed Get Up and Go performed: not able to be done due to virtual visit   Depression Screen PHQ 2/9 Scores 08/20/2019 04/03/2019 08/17/2018 03/28/2018  PHQ - 2 Score 0 0 0 0     Cognitive Function     6CIT Screen 08/20/2019 08/17/2018 08/15/2017 07/28/2016  What Year? 0 points 0 points 0 points 0 points  What month? 0 points 0 points 0 points 0 points  What time? 0 points 0 points 0 points 0 points  Count back from 20 0 points 0 points 0 points 0 points  Months in reverse 0 points 0 points 0 points 0 points  Repeat phrase 0 points 0 points 0 points 0 points  Total Score 0 0 0 0    Immunization History  Administered Date(s) Administered  . Fluad Quad(high Dose 65+) 01/15/2019  . Influenza Split 02/04/2011  . Influenza Whole 04/03/2009, 01/01/2010  . Influenza, High Dose Seasonal PF 03/28/2018  . Influenza,inj,Quad PF,6+ Mos 03/20/2013, 01/24/2014, 02/24/2015, 02/03/2016, 03/23/2017  . Moderna SARS-COVID-2 Vaccination 06/13/2019, 07/11/2019  . Pneumococcal Conjugate-13 07/03/2014  . Pneumococcal Polysaccharide-23 03/20/2013  . Td  04/03/2009  . Zoster 02/04/2011    Qualifies for Shingles Vaccine? qualifies  Screening Tests Health Maintenance  Topic Date Due  . TETANUS/TDAP  04/04/2019  . INFLUENZA VACCINE  11/25/2019  . MAMMOGRAM  04/10/2021  . COLONOSCOPY  10/23/2023  . DEXA SCAN  Completed  . COVID-19 Vaccine  Completed  . Hepatitis C Screening  Completed  . PNA vac Low Risk Adult  Completed    Cancer Screenings: Lung: Low Dose CT Chest recommended if Age 66-80 years, 30 pack-year currently smoking OR have quit w/in 15years. Patient does not qualify. Breast:  Up to date on Mammogram? Yes   Up to date of Bone Density/Dexa? Yes Colorectal: up to date   Additional Screenings:  Hepatitis C Screening: done      Plan:     I have personally reviewed and noted the following in the patient's chart:   . Medical and social history . Use of alcohol, tobacco or illicit drugs  . Current medications and supplements . Functional ability and status . Nutritional status . Physical activity . Advanced directives . List of other physicians . Hospitalizations, surgeries, and ER visits in previous 12 months . Vitals . Screenings to include cognitive, depression, and falls . Referrals and appointments  In addition, I have reviewed and discussed with patient certain preventive protocols, quality metrics, and best practice recommendations. A written personalized care plan for preventive services as well as general preventive health recommendations were provided to patient.     Kate Sable, LPN, LPN  [phone]

## 2019-08-20 NOTE — Patient Instructions (Signed)
Adrienne [name] , Thank you for taking time to come for your Medicare Wellness Visit. I appreciate your ongoing commitment to your health goals. Please review the following plan we discussed and let me know if I can assist you in the future.   Screening recommendations/referrals: Colonoscopy: up to date Mammogram: up to date  Bone Density: up to date  Recommended yearly ophthalmology/optometry visit for glaucoma screening and checkup Recommended yearly dental visit for hygiene and checkup  Vaccinations: Influenza vaccine: due in the fall  Pneumococcal vaccine: up to date  Tdap vaccine: due- check with insurance to see if covered  Shingles vaccine: check with pharmacy   Advanced directives: mailed  Conditions/risks identified: fall risk   Next appointment: 1 year    Preventive Care 72 Years and Older, Female Preventive care refers to lifestyle choices and visits with your health care provider that can promote health and wellness. What does preventive care include?  A yearly physical exam. This is also called an annual well check.  Dental exams once or twice a year.  Routine eye exams. Ask your health care provider how often you should have your eyes checked.  Personal lifestyle choices, including:  Daily care of your teeth and gums.  Regular physical activity.  Eating a healthy diet.  Avoiding tobacco and drug use.  Limiting alcohol use.  Practicing safe sex.  Taking low-dose aspirin every day.  Taking vitamin and mineral supplements as recommended by your health care provider. What happens during an annual well check? The services and screenings done by your health care provider during your annual well check will depend on your age, overall health, lifestyle risk factors, and family history of disease. Counseling  Your health care provider may ask you questions about your:  Alcohol use.  Tobacco use.  Drug use.  Emotional well-being.  Home and relationship  well-being.  Sexual activity.  Eating habits.  History of falls.  Memory and ability to understand (cognition).  Work and work Statistician.  Reproductive health. Screening  You may have the following tests or measurements:  Height, weight, and BMI.  Blood pressure.  Lipid and cholesterol levels. These may be checked every 5 years, or more frequently if you are over 20 years old.  Skin check.  Lung cancer screening. You may have this screening every year starting at age 72 if you have a 30-pack-year history of smoking and currently smoke or have quit within the past 15 years.  Fecal occult blood test (FOBT) of the stool. You may have this test every year starting at age 72.  Flexible sigmoidoscopy or colonoscopy. You may have a sigmoidoscopy every 5 years or a colonoscopy every 10 years starting at age 72.  Hepatitis C blood test.  Hepatitis B blood test.  Sexually transmitted disease (STD) testing.  Diabetes screening. This is done by checking your blood sugar (glucose) after you have not eaten for a while (fasting). You may have this done every 1-3 years.  Bone density scan. This is done to screen for osteoporosis. You may have this done starting at age 72.  Mammogram. This may be done every 1-2 years. Talk to your health care provider about how often you should have regular mammograms. Talk with your health care provider about your test results, treatment options, and if necessary, the need for more tests. Vaccines  Your health care provider may recommend certain vaccines, such as:  Influenza vaccine. This is recommended every year.  Tetanus, diphtheria, and acellular pertussis (Tdap,  Td) vaccine. You may need a Td booster every 10 years.  Zoster vaccine. You may need this after age 72.  Pneumococcal 13-valent conjugate (PCV13) vaccine. One dose is recommended after age 72.  Pneumococcal polysaccharide (PPSV23) vaccine. One dose is recommended after age  72. Talk to your health care provider about which screenings and vaccines you need and how often you need them. This information is not intended to replace advice given to you by your health care provider. Make sure you discuss any questions you have with your health care provider. Document Released: 05/09/2015 Document Revised: 12/31/2015 Document Reviewed: 02/11/2015 Elsevier Interactive Patient Education  2017 [place] Prevention in the Home Falls can cause injuries. They can happen to people of all ages. There are many things you can do to make your home safe and to help prevent falls. What can I do on the outside of my home?  Regularly fix the edges of walkways and driveways and fix any cracks.  Remove anything that might make you trip as you walk through a door, such as a raised step or threshold.  Trim any bushes or trees on the path to your home.  Use bright outdoor lighting.  Clear any walking paths of anything that might make someone trip, such as rocks or tools.  Regularly check to see if handrails are loose or broken. Make sure that both sides of any steps have handrails.  Any raised decks and porches should have guardrails on the edges.  Have any leaves, snow, or ice cleared regularly.  Use sand or salt on walking paths during winter.  Clean up any spills in your garage right away. This includes oil or grease spills. What can I do in the bathroom?  Use night lights.  Install grab bars by the toilet and in the tub and shower. Do not use towel bars as grab bars.  Use non-skid mats or decals in the tub or shower.  If you need to sit down in the shower, use a plastic, non-slip stool.  Keep the floor dry. Clean up any water that spills on the floor as soon as it happens.  Remove soap buildup in the tub or shower regularly.  Attach bath mats securely with double-sided non-slip rug tape.  Do not have throw rugs and other things on the floor that can make  you trip. What can I do in the bedroom?  Use night lights.  Make sure that you have a light by your bed that is easy to reach.  Do not use any sheets or blankets that are too big for your bed. They should not hang down onto the floor.  Have a firm chair that has side arms. You can use this for support while you get dressed.  Do not have throw rugs and other things on the floor that can make you trip. What can I do in the kitchen?  Clean up any spills right away.  Avoid walking on wet floors.  Keep items that you use a lot in easy-to-reach places.  If you need to reach something above you, use a strong step stool that has a grab bar.  Keep electrical cords out of the way.  Do not use floor polish or wax that makes floors slippery. If you must use wax, use non-skid floor wax.  Do not have throw rugs and other things on the floor that can make you trip. What can I do with my stairs?  Do not  leave any items on the stairs.  Make sure that there are handrails on both sides of the stairs and use them. Fix handrails that are broken or loose. Make sure that handrails are as long as the stairways.  Check any carpeting to make sure that it is firmly attached to the stairs. Fix any carpet that is loose or worn.  Avoid having throw rugs at the top or bottom of the stairs. If you do have throw rugs, attach them to the floor with carpet tape.  Make sure that you have a light switch at the top of the stairs and the bottom of the stairs. If you do not have them, ask someone to add them for you. What else can I do to help prevent falls?  Wear shoes that:  Do not have high heels.  Have rubber bottoms.  Are comfortable and fit you well.  Are closed at the toe. Do not wear sandals.  If you use a stepladder:  Make sure that it is fully opened. Do not climb a closed stepladder.  Make sure that both sides of the stepladder are locked into place.  Ask someone to hold it for you, if  possible.  Clearly mark and make sure that you can see:  Any grab bars or handrails.  First and last steps.  Where the edge of each step is.  Use tools that help you move around (mobility aids) if they are needed. These include:  Canes.  Walkers.  Scooters.  Crutches.  Turn on the lights when you go into a dark area. Replace any light bulbs as soon as they burn out.  Set up your furniture so you have a clear path. Avoid moving your furniture around.  If any of your floors are uneven, fix them.  If there are any pets around you, be aware of where they are.  Review your medicines with your doctor. Some medicines can make you feel dizzy. This can increase your chance of falling. Ask your doctor what other things that you can do to help prevent falls. This information is not intended to replace advice given to you by your health care provider. Make sure you discuss any questions you have with your health care provider. Document Released: 02/06/2009 Document Revised: 09/18/2015 Document Reviewed: 05/17/2014 Elsevier Interactive Patient Education  2017 [employer].

## 2019-08-21 ENCOUNTER — Encounter: Payer: Medicare Other | Admitting: Family Medicine

## 2019-08-21 ENCOUNTER — Encounter (HOSPITAL_COMMUNITY): Payer: Self-pay | Admitting: Physical Therapy

## 2019-08-21 ENCOUNTER — Other Ambulatory Visit: Payer: Self-pay

## 2019-08-21 ENCOUNTER — Ambulatory Visit (HOSPITAL_COMMUNITY): Payer: Medicare Other | Admitting: Physical Therapy

## 2019-08-21 DIAGNOSIS — M25551 Pain in right hip: Secondary | ICD-10-CM | POA: Diagnosis not present

## 2019-08-21 DIAGNOSIS — M25561 Pain in right knee: Secondary | ICD-10-CM

## 2019-08-21 DIAGNOSIS — M545 Other chronic pain: Secondary | ICD-10-CM

## 2019-08-21 DIAGNOSIS — G8929 Other chronic pain: Secondary | ICD-10-CM | POA: Diagnosis not present

## 2019-08-21 DIAGNOSIS — R262 Difficulty in walking, not elsewhere classified: Secondary | ICD-10-CM | POA: Diagnosis not present

## 2019-08-21 NOTE — Therapy (Signed)
[facility] [address] [place], [place], [address] Phone: [phone]   Fax:  [phone]  Physical Therapy Treatment and Discharge Note  [name] MRN: [id number] Date of Birth: October 09, 1945 Referring Provider (PT): Earlie Server, MD  PHYSICAL THERAPY DISCHARGE SUMMARY  Visits from Start of Care: 26  Current functional level related to goals / functional outcomes: All goals, except strength goal met at this time   Remaining deficits: Continued pain and weakness   Education / Equipment: See below Plan: Patient agrees to discharge.  Patient goals were met. Patient is being discharged due to meeting the stated rehab goals.  ?????        Encounter Date: 08/21/2019  PT End of Session - 08/21/19 1450    Visit Number  26    Number of Visits  30    Date for PT Re-Evaluation  08/21/19    Authorization Type  Primary: Medicare, Secondary: BCBS    Authorization Time Period  NEW POC DATES 07/24/19 to 08/21/19    Progress Note Due on Visit  29    PT Start Time  1450    PT Stop Time  1530    PT Time Calculation (min)  40 min    Activity Tolerance  Patient tolerated treatment well;Patient limited by pain    Behavior During Therapy  [facility] for tasks assessed/performed       Past Medical History:  Diagnosis Date  . Allergy   . Diverticulosis 05/2009  . Hyperlipidemia   . Hypertension     Past Surgical History:  Procedure Laterality Date  . ABDOMINAL HYSTERECTOMY  1990   for fibroids ovaries remain   . CHOLECYSTECTOMY  1980  . COLONOSCOPY  05/2009   rare diverticula, small internal hemorrhoids. Next colonoscopy in 5-10 yrs.  . COLONOSCOPY N/A 10/23/2018   Procedure: COLONOSCOPY;  Surgeon: Danie Binder, MD;  [facility];  Service: Endoscopy;  Laterality: N/A;  2:00pm  . left nipple inversion bx benign  2000  . POLYPECTOMY  10/23/2018   Procedure: POLYPECTOMY;  Surgeon: Danie Binder, MD;   Location: [facility];  Service: Endoscopy;;    There were no vitals filed for this visit.  Subjective Assessment - 08/21/19 1552    Subjective  States she has been feeling good. States her knee has been feeling good, she elevated it and that has helped. States she feels 85% better since the start of PT. Now she is doing her errands prior to sister was doing all her errands.    Currently in Pain?  Yes    Pain Score  1     Pain Location  Hip    Pain Orientation  Right    Pain Descriptors / Indicators  Aching         [facility] PT Assessment - 08/21/19 0001      Assessment   Medical Diagnosis  R knee/hip pain, LBP    Referring Provider (PT)  Earlie Server, MD      Observation/Other Assessments   Focus on Therapeutic Outcomes (FOTO)   41% limited   was 50%limited initially     AROM   Lumbar Flexion  0% limited no pain     Lumbar Extension  25% limited no pain    Lumbar - Right Side Bend  0%limited no pain    Lumbar - Left Side Bend  25%limited no pain    Lumbar - Right Rotation  25%  limited no pain    Lumbar - Left Rotation  25%limited no pain                            PT Education - 08/21/19 1609    Education Details  on progress, on HEP, on current presentation and progress to be made. On anatomy of hip joint and how her exercise is helping and why she is popping in her joint (patient expressed concern about popping). Used diagram and physical model to help explain concepts to patient    Person(s) Educated  Patient    Methods  Explanation;Demonstration    Comprehension  Verbalized understanding       PT Short Term Goals - 08/21/19 1605      PT SHORT TERM GOAL #1   Title  Pt will perform HEP at least 2x/week to reduce pain and improve overall ability to complete ADLs.    Baseline  2/23: pt reports performing HEP each morning    Time  3    Period  Weeks    Status  Achieved    Target Date  06/04/19      PT SHORT TERM GOAL #2   Title  Pt will  demonstrate lumbar AROM WFL and pain-free to improve ability to complete self care tasks, household chores, and volunteering.    Baseline  4/27 see AROM    Time  3    Period  Weeks    Status  Achieved        PT Long Term Goals - 08/21/19 1505      PT LONG TERM GOAL #1   Title  Pt will demonstrate RLE strength equal to LLE to improve gait mechanics, standing tolerance, and reduce pain with funcitonal activities.    Baseline  4/27 improved standing tolerance, gait mechanics and functional activity tolerance - continued limp occassionally    Time  6    Period  Weeks    Status  Not Met      PT LONG TERM GOAL #2   Title  Pt will perform 5x STS in 15 sec, no UE assist, equal BLE weight-bearing and no increased R hip pain.    Baseline  3/30: 14.9 sec, weight-shifted to LLE, increased back pain breifly on 3rd rep    Time  6    Period  Weeks    Status  Achieved      PT LONG TERM GOAL #3   Title  Pt will self report low back, hip and knee pain 5/10 at worst with standing and ambulation to indicate improvement in ability to grocery shop and navigate community.    Baseline  4/27 worse pain 3/10 pain over the last couple days    Time  6    Period  Weeks    Status  Achieved      PT LONG TERM GOAL #4   Title  Pt will improve FOTO score to 25% limited to indicate improvement in overall functional abilities and return to personal exercise program.    Baseline  2/23: 47% limited- not a good measure for patient to measure progress    Time  6    Period  Weeks    Status  Deferred      PT LONG TERM GOAL #5   Title  Patient will be able to ambulate to her brother's house (4 houses down) to demonstrate improved functional mobilty.    Baseline  been  able to do twice    Time  4    Period  Weeks    Status  Achieved      PT LONG TERM GOAL #6   Title  Patient will be able to stand for at least 10 minutes to improve ability to wash dishes at home.    Baseline  able to do    Time  4    Period   Weeks    Status  Achieved            Plan - 08/21/19 1607    Clinical Impression Statement  Patient has made tremendous progress since start of therapy. 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 Session focused on review and answering all questions about progress made, progress to be made and review of home exercises program. Patient is to discharge from physical therapy at this time to home program secondary to progress made. All short term goals met and all but one long term goal met.    Personal Factors and Comorbidities  Past/Current Experience;Time since onset of injury/illness/exacerbation    Examination-Activity Limitations  Lift;Locomotion Level;Stand;Transfers    Examination-Participation Restrictions  Church;Cleaning;Meal Prep;Volunteer    Stability/Clinical Decision Making  Stable/Uncomplicated    Rehab Potential  Good    PT Frequency  2x / week    PT Duration  4 weeks    PT Treatment/Interventions  ADLs/Self Care Home Management;Aquatic Therapy;Biofeedback;Cryotherapy;Moist Heat;Traction;Ultrasound;DME Instruction;Gait training;Stair training;Functional mobility training;Therapeutic activities;Therapeutic exercise;Balance training;Neuromuscular re-education;Patient/family education;Orthotic Fit/Training;Manual techniques;Passive range of motion;Dry needling;Taping;Joint Manipulations    PT Next Visit Plan  patient to discharge from PT to HEP at this time    PT Home Exercise Plan  3/10 self mobilziation to adductors and hip flexors, glute isometrics, seated lunge stretch and straddle stance, hip abd in supine.; 3/23 flobility exercise; 3/30 walking program, prone exercises 4/6 posture exercises, spinal lengthening and lateral rib opener in side lying    Consulted and Agree with Plan of Care  Patient        Patient will benefit from skilled therapeutic intervention in order to improve the following deficits and impairments:  Abnormal gait, Decreased activity tolerance, Decreased balance, Decreased endurance, Decreased range of motion, Decreased strength, Difficulty walking, Increased fascial restricitons, Increased muscle spasms, Impaired perceived functional ability, Impaired flexibility, Impaired vision/preception, Improper body mechanics, Postural dysfunction, Pain  Visit Diagnosis: Difficulty in walking, not elsewhere classified  Pain in right hip  Chronic pain of right knee  Chronic bilateral low back pain without sciatica     Problem List Patient Active Problem List   Diagnosis Date Noted  . Knee pain, right 04/06/2019  . Left ankle sprain 04/06/2019  . Personal history of colonic polyps 07/12/2018  . Ingrown right big toenail 03/23/2017  . Hip pain, chronic, right 02/03/2016  . GERD (gastroesophageal reflux disease) 12/26/2014  . Diverticulosis of colon without hemorrhage 07/05/2014  . FH: pancreatic cancer 01/26/2014  . FH: glaucoma 03/24/2013  . Overweight (BMI 25.0-29.9) 03/13/2012  . Hyperlipidemia LDL goal <100 04/03/2009  . Essential hypertension 04/03/2009    4:11 PM, 08/21/19 Jerene Pitch, DPT Physical Therapy with [facility]  [phone] office  [place] [address] [place], [place], [address] Phone: [phone]   Fax:  [phone]  Name: Shantrice  Gherardi MRN: [id number] Date of Birth: Nov 06, 1945

## 2019-08-23 ENCOUNTER — Ambulatory Visit (HOSPITAL_COMMUNITY): Payer: Medicare Other | Admitting: Physical Therapy

## 2019-11-07 ENCOUNTER — Other Ambulatory Visit: Payer: Self-pay | Admitting: Family Medicine

## 2019-11-15 DIAGNOSIS — M1611 Unilateral primary osteoarthritis, right hip: Secondary | ICD-10-CM | POA: Diagnosis not present

## 2019-11-17 DIAGNOSIS — M545 Low back pain: Secondary | ICD-10-CM | POA: Diagnosis not present

## 2019-11-19 ENCOUNTER — Telehealth: Payer: Self-pay | Admitting: Family Medicine

## 2019-11-19 MED ORDER — GABAPENTIN 100 MG PO CAPS: ORAL_CAPSULE | ORAL | 3 refills | Status: DC

## 2019-11-19 NOTE — Telephone Encounter (Signed)
Pt having excessive knee and back pain, unable to complete mRI ordered by Ortho this past Saturday. Pain keeps her awake Will rx gabapentin 100 , one to two at bedtime. Currently on tylenol and meloxicam from Ortho She will call in if she needs me

## 2019-11-23 DIAGNOSIS — M1611 Unilateral primary osteoarthritis, right hip: Secondary | ICD-10-CM | POA: Diagnosis not present

## 2019-12-20 DIAGNOSIS — M1611 Unilateral primary osteoarthritis, right hip: Secondary | ICD-10-CM | POA: Diagnosis not present

## 2020-01-08 DIAGNOSIS — M1611 Unilateral primary osteoarthritis, right hip: Secondary | ICD-10-CM | POA: Diagnosis not present

## 2020-01-08 DIAGNOSIS — M545 Low back pain: Secondary | ICD-10-CM | POA: Diagnosis not present

## 2020-01-08 DIAGNOSIS — M5416 Radiculopathy, lumbar region: Secondary | ICD-10-CM | POA: Diagnosis not present

## 2020-01-08 DIAGNOSIS — M25561 Pain in right knee: Secondary | ICD-10-CM | POA: Diagnosis not present

## 2020-01-21 DIAGNOSIS — M5416 Radiculopathy, lumbar region: Secondary | ICD-10-CM | POA: Diagnosis not present

## 2020-02-04 ENCOUNTER — Other Ambulatory Visit: Payer: Self-pay | Admitting: Family Medicine

## 2020-02-05 ENCOUNTER — Ambulatory Visit (INDEPENDENT_AMBULATORY_CARE_PROVIDER_SITE_OTHER): Payer: Medicare Other

## 2020-02-05 ENCOUNTER — Other Ambulatory Visit: Payer: Self-pay

## 2020-02-05 DIAGNOSIS — Z23 Encounter for immunization: Secondary | ICD-10-CM

## 2020-02-12 DIAGNOSIS — M1611 Unilateral primary osteoarthritis, right hip: Secondary | ICD-10-CM | POA: Diagnosis not present

## 2020-02-12 DIAGNOSIS — M5416 Radiculopathy, lumbar region: Secondary | ICD-10-CM | POA: Diagnosis not present

## 2020-02-18 DIAGNOSIS — M1611 Unilateral primary osteoarthritis, right hip: Secondary | ICD-10-CM | POA: Diagnosis not present

## 2020-03-04 ENCOUNTER — Other Ambulatory Visit: Payer: Self-pay

## 2020-03-04 ENCOUNTER — Encounter: Payer: Self-pay | Admitting: Internal Medicine

## 2020-03-04 ENCOUNTER — Ambulatory Visit (INDEPENDENT_AMBULATORY_CARE_PROVIDER_SITE_OTHER): Payer: Medicare Other | Admitting: Internal Medicine

## 2020-03-04 VITALS — BP 117/74 | HR 72 | Temp 97.3°F | Resp 18 | Ht 65.5 in | Wt 166.4 lb

## 2020-03-04 DIAGNOSIS — R7303 Prediabetes: Secondary | ICD-10-CM

## 2020-03-04 DIAGNOSIS — I10 Essential (primary) hypertension: Secondary | ICD-10-CM

## 2020-03-04 DIAGNOSIS — E785 Hyperlipidemia, unspecified: Secondary | ICD-10-CM | POA: Diagnosis not present

## 2020-03-04 DIAGNOSIS — Z01818 Encounter for other preprocedural examination: Secondary | ICD-10-CM

## 2020-03-04 DIAGNOSIS — E559 Vitamin D deficiency, unspecified: Secondary | ICD-10-CM

## 2020-03-04 LAB — POCT GLYCOSYLATED HEMOGLOBIN (HGB A1C)
HbA1c POC (<> result, manual entry): 5.9 % (ref 4.0–5.6)
HbA1c, POC (controlled diabetic range): 5.9 % (ref 0.0–7.0)
HbA1c, POC (prediabetic range): 5.9 % (ref 5.7–6.4)
Hemoglobin A1C: 5.9 % — AB (ref 4.0–5.6)

## 2020-03-04 NOTE — Patient Instructions (Addendum)
Please continue to take medications as prescribed.  Please continue to follow low sodium diet.  Please get blood tests done as advised.  We will fax the medical clearance documents for your surgery to your Surgeon.

## 2020-03-05 DIAGNOSIS — E559 Vitamin D deficiency, unspecified: Secondary | ICD-10-CM | POA: Diagnosis not present

## 2020-03-05 DIAGNOSIS — E785 Hyperlipidemia, unspecified: Secondary | ICD-10-CM | POA: Diagnosis not present

## 2020-03-05 DIAGNOSIS — Z01818 Encounter for other preprocedural examination: Secondary | ICD-10-CM | POA: Diagnosis not present

## 2020-03-05 DIAGNOSIS — I10 Essential (primary) hypertension: Secondary | ICD-10-CM | POA: Diagnosis not present

## 2020-03-06 DIAGNOSIS — E559 Vitamin D deficiency, unspecified: Secondary | ICD-10-CM | POA: Insufficient documentation

## 2020-03-06 DIAGNOSIS — R7303 Prediabetes: Secondary | ICD-10-CM | POA: Insufficient documentation

## 2020-03-06 DIAGNOSIS — Z01818 Encounter for other preprocedural examination: Secondary | ICD-10-CM | POA: Insufficient documentation

## 2020-03-06 LAB — CMP14+EGFR
ALT: 19 IU/L (ref 0–32)
AST: 19 IU/L (ref 0–40)
Albumin/Globulin Ratio: 2 (ref 1.2–2.2)
Albumin: 4.5 g/dL (ref 3.7–4.7)
Alkaline Phosphatase: 112 IU/L (ref 44–121)
BUN/Creatinine Ratio: 26 (ref 12–28)
BUN: 23 mg/dL (ref 8–27)
Bilirubin Total: 0.5 mg/dL (ref 0.0–1.2)
CO2: 25 mmol/L (ref 20–29)
Calcium: 10.5 mg/dL — ABNORMAL HIGH (ref 8.7–10.3)
Chloride: 102 mmol/L (ref 96–106)
Creatinine, Ser: 0.88 mg/dL (ref 0.57–1.00)
GFR calc Af Amer: 76 mL/min/{1.73_m2} (ref >59)
GFR calc non Af Amer: 66 mL/min/{1.73_m2} (ref >59)
Globulin, Total: 2.2 g/dL (ref 1.5–4.5)
Glucose: 67 mg/dL (ref 65–99)
Potassium: 4.1 mmol/L (ref 3.5–5.2)
Sodium: 141 mmol/L (ref 134–144)
Total Protein: 6.7 g/dL (ref 6.0–8.5)

## 2020-03-06 LAB — CBC WITH DIFFERENTIAL/PLATELET
Basophils Absolute: 0 10*3/uL (ref 0.0–0.2)
Basos: 1 %
EOS (ABSOLUTE): 0.2 10*3/uL (ref 0.0–0.4)
Eos: 3 %
Hematocrit: 34.9 % (ref 34.0–46.6)
Hemoglobin: 11.9 g/dL (ref 11.1–15.9)
Immature Grans (Abs): 0 10*3/uL (ref 0.0–0.1)
Immature Granulocytes: 0 %
Lymphocytes Absolute: 2.8 10*3/uL (ref 0.7–3.1)
Lymphs: 43 %
MCH: 30.7 pg (ref 26.6–33.0)
MCHC: 34.1 g/dL (ref 31.5–35.7)
MCV: 90 fL (ref 79–97)
Monocytes Absolute: 0.6 10*3/uL (ref 0.1–0.9)
Monocytes: 9 %
Neutrophils Absolute: 2.9 10*3/uL (ref 1.4–7.0)
Neutrophils: 44 %
Platelets: 290 10*3/uL (ref 150–450)
RBC: 3.88 x10E6/uL (ref 3.77–5.28)
RDW: 13 % (ref 11.7–15.4)
WBC: 6.5 10*3/uL (ref 3.4–10.8)

## 2020-03-06 LAB — LIPID PANEL
Chol/HDL Ratio: 2.2 ratio (ref 0.0–4.4)
Cholesterol, Total: 249 mg/dL — ABNORMAL HIGH (ref 100–199)
HDL: 111 mg/dL (ref >39)
LDL Chol Calc (NIH): 125 mg/dL — ABNORMAL HIGH (ref 0–99)
Triglycerides: 80 mg/dL (ref 0–149)
VLDL Cholesterol Cal: 13 mg/dL (ref 5–40)

## 2020-03-06 LAB — VITAMIN D 25 HYDROXY (VIT D DEFICIENCY, FRACTURES): Vit D, 25-Hydroxy: 36.7 ng/mL (ref 30.0–100.0)

## 2020-03-06 LAB — PROTIME-INR
INR: 1 (ref 0.9–1.2)
Prothrombin Time: 10.3 s (ref 9.1–12.0)

## 2020-03-06 LAB — TSH: TSH: 1.2 u[IU]/mL (ref 0.450–4.500)

## 2020-03-06 MED ORDER — SIMVASTATIN 20 MG PO TABS: 20.0000 mg | ORAL_TABLET | Freq: Every day | ORAL | 3 refills | Status: DC

## 2020-03-06 NOTE — Assessment & Plan Note (Signed)
BP Readings from Last 1 Encounters:  03/04/20 117/74   Well-controlled with Amlodipine and Maxzide Counseled for compliance with the medications Advised DASH diet and moderate exercise/walking, at least 150 mins/week

## 2020-03-06 NOTE — Assessment & Plan Note (Signed)
Physical exam as above BP well-controlled, EKG reveiwed - sinus rhythm, no signs of active ischemia. Patient is medically optimized for THR

## 2020-03-06 NOTE — Progress Notes (Signed)
New Patient Office Visit  Subjective:  Patient ID: Adrienne [name], female    DOB: 07/15/1945  Age: 74 y.o. MRN: [id number]  CC:  Chief Complaint  Patient presents with  . Follow-up    follow up surgery clarence     HPI Adrienne [name] is a 74 year old female with past medical history of hypertension, GERD and chronic right hip pain presents for evaluation for preoperative medical exam.  She states that she has been in good health overall.  She takes her medications for her blood pressure regularly.  Her blood pressure is well controlled currently, 117/74.  She denies any headache, dizziness, chest pain, dyspnea or palpitations.  She c/o chronic hip pain, for which she is undergoing THR. Blood tests were ordered during the visit and have been reviewed later.  Past Medical History:  Diagnosis Date  . Allergy   . Diverticulosis 05/2009  . Hyperlipidemia   . Hypertension     Past Surgical History:  Procedure Laterality Date  . ABDOMINAL HYSTERECTOMY  1990   for fibroids ovaries remain   . CHOLECYSTECTOMY  1980  . COLONOSCOPY  05/2009   rare diverticula, small internal hemorrhoids. Next colonoscopy in 5-10 yrs.  . COLONOSCOPY N/A 10/23/2018   Procedure: COLONOSCOPY;  Surgeon: Danie Binder, MD;  [facility];  Service: Endoscopy;  Laterality: N/A;  2:00pm  . left nipple inversion bx benign  2000  . POLYPECTOMY  10/23/2018   Procedure: POLYPECTOMY;  Surgeon: Danie Binder, MD;  [facility];  Service: Endoscopy;;    Family History  Problem Relation Age of Onset  . Kidney failure Father   . Arthritis Father   . Heart disease Sister   . Hypertension Sister   . Arthritis Sister   . Breast cancer Sister   . Diabetes Sister   . Arthritis Sister   . Lung cancer Brother        was a smoker  . Arthritis Brother   . Arthritis Mother   . Prostate cancer Brother   . Colon cancer Neg Hx     Social History   Socioeconomic History  . Marital status:  Single    Spouse name: Not on file  . Number of children: 0  . Years of education: Not on file  . Highest education level: Not on file  Occupational History  . Occupation: retired    Fish farm manager: RETIRED  Tobacco Use  . Smoking status: Never Smoker  . Smokeless tobacco: Never Used  Vaping Use  . Vaping Use: Never used  Substance and Sexual Activity  . Alcohol use: Yes    Comment: rarely   . Drug use: No  . Sexual activity: Not Currently    Birth control/protection: Surgical    Comment: not asked  Other Topics Concern  . Not on file  Social History Narrative  . Not on file   Social Determinants of Health   Financial Resource Strain: Low Risk   . Difficulty of Paying Living Expenses: Not hard at all  Food Insecurity:   . Worried About Charity fundraiser in the Last Year: Not on file  . Ran Out of Food in the Last Year: Not on file  Transportation Needs: No Transportation Needs  . Lack of Transportation (Medical): No  . Lack of Transportation (Non-Medical): No  Physical Activity: Sufficiently Active  . Days of Exercise per Week: 6 days  . Minutes of Exercise per Session: 40 min  Stress:   .  Feeling of Stress : Not on file  Social Connections: Moderately Isolated  . Frequency of Communication with Friends and Family: More than three times a week  . Frequency of Social Gatherings with Friends and Family: More than three times a week  . Attends Religious Services: More than 4 times per year  . Active Member of Clubs or Organizations: No  . Attends Archivist Meetings: Never  . Marital Status: Never married  Intimate Partner Violence:   . Fear of Current or Ex-Partner: Not on file  . Emotionally Abused: Not on file  . Physically Abused: Not on file  . Sexually Abused: Not on file    ROS Review of Systems  Constitutional: Negative for chills and fever.  HENT: Negative for congestion, sinus pressure, sinus pain and sore throat.   Eyes: Negative for pain and  discharge.  Respiratory: Negative for cough and shortness of breath.   Cardiovascular: Negative for chest pain and palpitations.  Gastrointestinal: Negative for abdominal pain, constipation, diarrhea, nausea and vomiting.  Endocrine: Negative for polydipsia and polyuria.  Genitourinary: Negative for dysuria and hematuria.  Musculoskeletal: Positive for arthralgias. Negative for neck pain and neck stiffness.  Skin: Negative for rash.  Neurological: Negative for dizziness and weakness.  Psychiatric/Behavioral: Negative for agitation and behavioral problems.    Objective:   Today's Vitals: BP 117/74 (BP Location: Left Arm, Patient Position: Sitting, Cuff Size: Normal)   Pulse 72   Temp (!) 97.3 F (36.3 C) (Oral)   Resp 18   Ht 5' 5.5" (1.664 m)   Wt 166 lb 6.4 oz (75.5 kg)   SpO2 98%   BMI 27.27 kg/m   Physical Exam Vitals reviewed.  Constitutional:      General: She is not in acute distress.    Appearance: She is not diaphoretic.  HENT:     Head: Normocephalic and atraumatic.     Nose: Nose normal.     Mouth/Throat:     Mouth: Mucous membranes are moist.  Eyes:     General: No scleral icterus.    Extraocular Movements: Extraocular movements intact.     Pupils: Pupils are equal, round, and reactive to light.  Cardiovascular:     Rate and Rhythm: Normal rate and regular rhythm.     Pulses: Normal pulses.     Heart sounds: Normal heart sounds. No murmur heard.   Pulmonary:     Breath sounds: Normal breath sounds. No wheezing or rales.  Abdominal:     Palpations: Abdomen is soft.     Tenderness: There is no abdominal tenderness.  Musculoskeletal:     Cervical back: Neck supple. No tenderness.     Right lower leg: No edema.     Left lower leg: No edema.     Comments: Painful ROM at right hip  Skin:    General: Skin is warm.     Findings: No rash.  Neurological:     General: No focal deficit present.     Mental Status: She is alert and oriented to person, place,  and time.     Sensory: No sensory deficit.     Motor: No weakness.  Psychiatric:        Mood and Affect: Mood normal.        Behavior: Behavior normal.     Assessment & Plan:   Problem List Items Addressed This Visit      Preoperative general physical examination - Primary   Physical exam as above BP  well-controlled, EKG reveiwed - sinus rhythm, no signs of active ischemia. Patient is medically optimized for THR     Relevant Orders  CBC with Differential (Completed)  CMP14+EGFR (Completed)  INR/PT (Completed)  POCT glycosylated hemoglobin (Hb A1C) (Completed)    Cardiovascular and Mediastinum   Essential hypertension    BP Readings from Last 1 Encounters:  03/04/20 117/74   Well-controlled with Amlodipine and Maxzide Counseled for compliance with the medications Advised DASH diet and moderate exercise/walking, at least 150 mins/week       Relevant Medications   simvastatin (ZOCOR) 20 MG tablet   Other Relevant Orders   TSH (Completed)     Other   Hyperlipidemia LDL goal <100    Lipid profile reviewed Start Simvastatin 20 mg QD      Relevant Medications   simvastatin (ZOCOR) 20 MG tablet   Other Relevant Orders   CMP14+EGFR (Completed)   Lipid Profile (Completed)      Chronic hip pain Planned for THR F/u with Orthopedic surgeon      Vitamin D deficiency    On Calcium and Vit D supplements      Relevant Orders   Vitamin D (25 hydroxy) (Completed)   Prediabetes    HbA1C: 5.9 today in office Advised to follow diabetic diet CMP reviewed Will recheck HbA1C later         Outpatient Encounter Medications as of 03/04/2020  Medication Sig  . amLODipine (NORVASC) 2.5 MG tablet Take 1 tablet by mouth once daily  . Biotin 10 MG CAPS Take 10 mg by mouth every evening.  Maudry Mayhew, Angelica sinensis, (DONG QUAI PO) Take 1 tablet by mouth daily.   Marland Kitchen gabapentin (NEURONTIN) 100 MG capsule Take one to two capsules by mouth , at bedtime for pain  .  HYDROcodone-acetaminophen (NORCO/VICODIN) 5-325 MG tablet   . loratadine (CLARITIN) 10 MG tablet Take 10 mg by mouth daily as needed for allergies.  . meloxicam (MOBIC) 15 MG tablet   . Menthol-Methyl Salicylate (MUSCLE RUB) 10-15 % CREA Apply 1 application topically as needed for muscle pain.  . Multiple Minerals-Vitamins (CALCIUM-MAGNESIUM-ZINC-D3) TABS Take 3 tablets by mouth every evening.  . Multiple Vitamin (MULTIVITAMIN) tablet Take 1 tablet by mouth every evening. Take 1/2 by mouth in the AM and half at night  . naproxen sodium (ANAPROX) 220 MG tablet Take 220-440 mg by mouth 2 (two) times daily with a meal.   . triamterene-hydrochlorothiazide (MAXZIDE-25) 37.5-25 MG tablet Take 1 tablet by mouth once daily  . TURMERIC PO Take 450 mg by mouth every evening.  . simvastatin (ZOCOR) 20 MG tablet Take 1 tablet (20 mg total) by mouth at bedtime.   No facility-administered encounter medications on file as of 03/04/2020.    Follow-up: Return in about 3 months (around 06/04/2020).   Lindell Spar, MD

## 2020-03-06 NOTE — Assessment & Plan Note (Signed)
Lipid profile reviewed Start Simvastatin 20 mg QD

## 2020-03-06 NOTE — Assessment & Plan Note (Signed)
On Calcium and Vit D supplements

## 2020-03-06 NOTE — Assessment & Plan Note (Signed)
HbA1C: 5.9 today in office Advised to follow diabetic diet CMP reviewed Will recheck HbA1C later

## 2020-03-28 ENCOUNTER — Encounter: Payer: Self-pay | Admitting: Family Medicine

## 2020-03-31 ENCOUNTER — Ambulatory Visit (INDEPENDENT_AMBULATORY_CARE_PROVIDER_SITE_OTHER): Payer: Medicare Other | Admitting: Internal Medicine

## 2020-03-31 ENCOUNTER — Other Ambulatory Visit: Payer: Self-pay

## 2020-03-31 ENCOUNTER — Encounter: Payer: Self-pay | Admitting: Internal Medicine

## 2020-03-31 VITALS — BP 147/88 | HR 81 | Temp 99.0°F | Resp 18 | Ht 65.5 in | Wt 166.1 lb

## 2020-03-31 DIAGNOSIS — M7989 Other specified soft tissue disorders: Secondary | ICD-10-CM

## 2020-03-31 DIAGNOSIS — I872 Venous insufficiency (chronic) (peripheral): Secondary | ICD-10-CM

## 2020-03-31 DIAGNOSIS — I10 Essential (primary) hypertension: Secondary | ICD-10-CM

## 2020-03-31 NOTE — Assessment & Plan Note (Signed)
B/l pitting pedal edema Likely related to mobility issues related to hip pain Advised to keep legs elevated Compression stockings Already on Maxzide If persistent, may consider Lasix PRN for short-term

## 2020-03-31 NOTE — Progress Notes (Signed)
Established Patient Office Visit  Subjective:  Patient ID: Adrienne [name], female    DOB: [birth year]  Age: 74 y.o. MRN: [id number]  CC:  Chief Complaint  Patient presents with  . Foot Swelling    swelling in feet and legs this has been going on since 03-26-20 but getting worse she is supposed to have hip surgery in jan and she called Dr Percell Miller but couldnt get in touch with them     HPI Adrienne [name] is a 74 year old female with past medical history of hypertension, GERD and chronic right hip pain who presents for evaluation for b/l feet swelling.  Patient c/o b/l dorsal feet swelling for about a week, which has been getting worse. Of note, patient has chronic right hip pain, which has led to mobility issues for her. She has tried keep legs elevated in a chair, but she can't lie flat due to severe hip pain. She wants to try compression stocking. She tried to reach out to her Orthopedic surgeon, but has not heard back from them. Of note, she takes Maxzide for her hypertension. She denies any numbness or weakness in the feet. She mentions chronic tingling around her ankle, but has not been taking her Gabapentin. She denies any recent injury or insect bite. Her BP is elevated likely in the setting of pain. She denies any headache, dizziness, chest pain, dyspnea or palpitations.  Past Medical History:  Diagnosis Date  . Allergy   . Diverticulosis 05/2009  . Hyperlipidemia   . Hypertension     Past Surgical History:  Procedure Laterality Date  . ABDOMINAL HYSTERECTOMY  1990   for fibroids ovaries remain   . CHOLECYSTECTOMY  1980  . COLONOSCOPY  05/2009   rare diverticula, small internal hemorrhoids. Next colonoscopy in 5-10 yrs.  . COLONOSCOPY N/A 10/23/2018   Procedure: COLONOSCOPY;  Surgeon: Danie Binder, MD;  Location: [facility];  Service: Endoscopy;  Laterality: N/A;  2:00pm  . left nipple inversion bx benign  2000  . POLYPECTOMY  10/23/2018   Procedure: POLYPECTOMY;  Surgeon:  Danie Binder, MD;  Location: [facility];  Service: Endoscopy;;    Family History  Problem Relation Age of Onset  . Kidney failure Father   . Arthritis Father   . Heart disease Sister   . Hypertension Sister   . Arthritis Sister   . Breast cancer Sister   . Diabetes Sister   . Arthritis Sister   . Lung cancer Brother        was a smoker  . Arthritis Brother   . Arthritis Mother   . Prostate cancer Brother   . Colon cancer Neg Hx     Social History   Socioeconomic History  . Marital status: Single    Spouse name: Not on file  . Number of children: 0  . Years of education: Not on file  . Highest education level: Not on file  Occupational History  . Occupation: retired    Fish farm manager: RETIRED  Tobacco Use  . Smoking status: Never Smoker  . Smokeless tobacco: Never Used  Vaping Use  . Vaping Use: Never used  Substance and Sexual Activity  . Alcohol use: Yes    Comment: rarely   . Drug use: No  . Sexual activity: Not Currently    Birth control/protection: Surgical    Comment: not asked  Other Topics Concern  . Not on file  Social History Narrative  . Not on file   Social  Determinants of Health   Financial Resource Strain: Low Risk   . Difficulty of Paying Living Expenses: Not hard at all  Food Insecurity:   . Worried About Charity fundraiser in the Last Year: Not on file  . Ran Out of Food in the Last Year: Not on file  Transportation Needs: No Transportation Needs  . Lack of Transportation (Medical): No  . Lack of Transportation (Non-Medical): No  Physical Activity: Sufficiently Active  . Days of Exercise per Week: 6 days  . Minutes of Exercise per Session: 40 min  Stress:   . Feeling of Stress : Not on file  Social Connections: Moderately Isolated  . Frequency of Communication with Friends and Family: More than three times a week  . Frequency of Social Gatherings with Friends and Family: More than three times a week  . Attends Religious Services:  More than 4 times per year  . Active Member of Clubs or Organizations: No  . Attends Archivist Meetings: Never  . Marital Status: Never married  Intimate Partner Violence:   . Fear of Current or Ex-Partner: Not on file  . Emotionally Abused: Not on file  . Physically Abused: Not on file  . Sexually Abused: Not on file    Outpatient Medications Prior to Visit  Medication Sig Dispense Refill  . amLODipine (NORVASC) 2.5 MG tablet Take 1 tablet by mouth once daily 90 tablet 0  . Biotin 10 MG CAPS Take 10 mg by mouth every evening.    Maudry Mayhew, Angelica sinensis, (DONG QUAI PO) Take 1 tablet by mouth daily.     Marland Kitchen gabapentin (NEURONTIN) 100 MG capsule Take one to two capsules by mouth , at bedtime for pain 60 capsule 3  . HYDROcodone-acetaminophen (NORCO/VICODIN) 5-325 MG tablet     . loratadine (CLARITIN) 10 MG tablet Take 10 mg by mouth daily as needed for allergies.    . meloxicam (MOBIC) 15 MG tablet     . Menthol-Methyl Salicylate (MUSCLE RUB) 10-15 % CREA Apply 1 application topically as needed for muscle pain.    . Multiple Minerals-Vitamins (CALCIUM-MAGNESIUM-ZINC-D3) TABS Take 3 tablets by mouth every evening.    . Multiple Vitamin (MULTIVITAMIN) tablet Take 1 tablet by mouth every evening. Take 1/2 by mouth in the AM and half at night    . naproxen sodium (ANAPROX) 220 MG tablet Take 220-440 mg by mouth 2 (two) times daily with a meal.     . simvastatin (ZOCOR) 20 MG tablet Take 1 tablet (20 mg total) by mouth at bedtime. 90 tablet 3  . triamterene-hydrochlorothiazide (MAXZIDE-25) 37.5-25 MG tablet Take 1 tablet by mouth once daily 90 tablet 0  . TURMERIC PO Take 450 mg by mouth every evening.     No facility-administered medications prior to visit.    Allergies  Allergen Reactions  . Valsartan Shortness Of Breath  . Ace Inhibitors Cough    ROS Review of Systems  Constitutional: Negative for chills and fever.  HENT: Negative for congestion, sinus pressure,  sinus pain and sore throat.   Eyes: Negative for pain and discharge.  Respiratory: Negative for cough and shortness of breath.   Cardiovascular: Positive for leg swelling. Negative for chest pain and palpitations.  Gastrointestinal: Negative for abdominal pain, constipation, diarrhea, nausea and vomiting.  Endocrine: Negative for polydipsia and polyuria.  Genitourinary: Negative for dysuria and hematuria.  Musculoskeletal: Positive for arthralgias. Negative for neck pain and neck stiffness.  Skin: Negative for rash.  Neurological: Negative for dizziness and weakness.  Psychiatric/Behavioral: Negative for agitation and behavioral problems.      Objective:    Physical Exam Vitals reviewed.  Constitutional:      General: She is not in acute distress.    Appearance: She is not diaphoretic.  HENT:     Head: Normocephalic and atraumatic.     Nose: Nose normal.     Mouth/Throat:     Mouth: Mucous membranes are moist.  Eyes:     General: No scleral icterus.    Extraocular Movements: Extraocular movements intact.     Pupils: Pupils are equal, round, and reactive to light.  Cardiovascular:     Rate and Rhythm: Normal rate and regular rhythm.     Pulses: Normal pulses.     Heart sounds: Normal heart sounds. No murmur heard.   Pulmonary:     Breath sounds: Normal breath sounds. No wheezing or rales.  Abdominal:     Palpations: Abdomen is soft.     Tenderness: There is no abdominal tenderness.  Musculoskeletal:     Cervical back: Neck supple. No tenderness.     Right lower leg: Edema (2+ pitting edema, dorsum of foot) present.     Left lower leg: Edema (2+ pitting edema, dorsum of foot) present.     Comments: Painful ROM at right hip  Skin:    General: Skin is warm.     Findings: No rash.  Neurological:     General: No focal deficit present.     Mental Status: She is alert and oriented to person, place, and time.     Sensory: No sensory deficit.     Motor: No weakness.    Psychiatric:        Mood and Affect: Mood normal.        Behavior: Behavior normal.     BP (!) 147/88 (BP Location: Right Arm, Patient Position: Sitting, Cuff Size: Normal)   Pulse 81   Temp 99 F (37.2 C) (Oral)   Resp 18   Ht 5' 5.5" (1.664 m)   Wt 166 lb 1.9 oz (75.4 kg)   SpO2 100%   BMI 27.22 kg/m  Wt Readings from Last 3 Encounters:  03/31/20 166 lb 1.9 oz (75.4 kg)  03/04/20 166 lb 6.4 oz (75.5 kg)  08/20/19 164 lb (74.4 kg)     Health Maintenance Due  Topic Date Due  . TETANUS/TDAP  04/04/2019    There are no preventive care reminders to display for this patient.  Lab Results  Component Value Date   TSH 1.200 03/05/2020   Lab Results  Component Value Date   WBC 6.5 03/05/2020   HGB 11.9 03/05/2020   HCT 34.9 03/05/2020   MCV 90 03/05/2020   PLT 290 03/05/2020   Lab Results  Component Value Date   NA 141 03/05/2020   K 4.1 03/05/2020   CO2 25 03/05/2020   GLUCOSE 67 03/05/2020   BUN 23 03/05/2020   CREATININE 0.88 03/05/2020   BILITOT 0.5 03/05/2020   ALKPHOS 112 03/05/2020   AST 19 03/05/2020   ALT 19 03/05/2020   PROT 6.7 03/05/2020   ALBUMIN 4.5 03/05/2020   CALCIUM 10.5 (H) 03/05/2020   Lab Results  Component Value Date   CHOL 249 (H) 03/05/2020   Lab Results  Component Value Date   HDL 111 03/05/2020   Lab Results  Component Value Date   LDLCALC 125 (H) 03/05/2020   Lab Results  Component Value  Date   TRIG 80 03/05/2020   Lab Results  Component Value Date   CHOLHDL 2.2 03/05/2020   Lab Results  Component Value Date   HGBA1C 5.9 (A) 03/04/2020   HGBA1C 5.9 03/04/2020   HGBA1C 5.9 03/04/2020   HGBA1C 5.9 03/04/2020      Assessment & Plan:   Problem List Items Addressed This Visit      Cardiovascular and Mediastinum   Essential hypertension    BP Readings from Last 1 Encounters:  03/31/20 (!) 147/88   Elevated currently in the setting of pain Stable with Amlodipine and Maxzide Counseled for compliance with  the medications Advised DASH diet and moderate exercise/walking, at least 150 mins/week       Venous insufficiency of both lower extremities    Dependent LE edema b/l Advised to use compression stockings Keep legs elevated        Other   Leg swelling - Primary    B/l pitting pedal edema Likely related to mobility issues related to hip pain Advised to keep legs elevated Compression stockings Already on Maxzide If persistent, may consider Lasix PRN for short-term         No orders of the defined types were placed in this encounter.   Follow-up: Return if symptoms worsen or fail to improve.    Lindell Spar, MD

## 2020-03-31 NOTE — Assessment & Plan Note (Signed)
Dependent LE edema b/l Advised to use compression stockings Keep legs elevated

## 2020-03-31 NOTE — Assessment & Plan Note (Signed)
BP Readings from Last 1 Encounters:  03/31/20 (!) 147/88   Elevated currently in the setting of pain Stable with Amlodipine and Maxzide Counseled for compliance with the medications Advised DASH diet and moderate exercise/walking, at least 150 mins/week

## 2020-03-31 NOTE — Patient Instructions (Signed)
Please use compression stockings to help with the leg swelling.  Please try to keep legs elevated by sitting in like a recliner chair. Okay to put pillows underneath the calf at nighttime. Please avoid keeping the legs in the dependent posture like hanging while sitting in a chair.  Please continue your blood pressure medications as prescribed.

## 2020-04-03 ENCOUNTER — Ambulatory Visit: Payer: Medicare Other | Admitting: Family Medicine

## 2020-04-03 ENCOUNTER — Ambulatory Visit: Payer: Medicare Other

## 2020-04-10 ENCOUNTER — Ambulatory Visit: Payer: Medicare Other | Attending: Internal Medicine

## 2020-04-10 DIAGNOSIS — Z23 Encounter for immunization: Secondary | ICD-10-CM

## 2020-04-10 NOTE — Progress Notes (Signed)
   Covid-19 Vaccination Clinic  Name:  Adrienne [name]    MRN: [id number] DOB: 06/18/1945  04/10/2020  [name] was observed post Covid-19 immunization for 15 minutes without incident. She was provided with Vaccine Information Sheet and instruction to access the V-Safe system.   [name] was instructed to call 911 with any severe reactions post vaccine: Marland Kitchen Difficulty breathing  . Swelling of face and throat  . A fast heartbeat  . A bad rash all over body  . Dizziness and weakness   Immunizations Administered    Name Date Dose VIS Date Route   Moderna Covid-19 Booster Vaccine 04/10/2020  2:26 PM 0.25 mL 02/13/2020 Intramuscular   Manufacturer: Levan Hurst   Lot: [id number]   [place]: 40352-481-85      Covid-19 Vaccination Clinic  Name:  Adrienne [name]    MRN: [id number] DOB: 01-Sep-1945  04/10/2020  [name] was observed post Covid-19 immunization for 15 minutes without incident. She was provided with Vaccine Information Sheet and instruction to access the V-Safe system.   [name] was instructed to call 911 with any severe reactions post vaccine: Marland Kitchen Difficulty breathing  . Swelling of face and throat  . A fast heartbeat  . A bad rash all over body  . Dizziness and weakness   Immunizations Administered    Name Date Dose VIS Date Route   Moderna Covid-19 Booster Vaccine 04/10/2020  2:26 PM 0.25 mL 02/13/2020 Intramuscular   Manufacturer: Moderna   Lot: [id number]   [place]: 72257-505-18

## 2020-04-14 ENCOUNTER — Other Ambulatory Visit: Payer: Self-pay

## 2020-04-14 MED ORDER — GABAPENTIN 100 MG PO CAPS
ORAL_CAPSULE | ORAL | 3 refills | Status: DC
Start: 2020-04-14 — End: 2020-06-03

## 2020-04-14 MED ORDER — AMLODIPINE BESYLATE 2.5 MG PO TABS
2.5000 mg | ORAL_TABLET | Freq: Every day | ORAL | 0 refills | Status: DC
Start: 2020-04-14 — End: 2020-06-26

## 2020-04-14 MED ORDER — TRIAMTERENE-HCTZ 37.5-25 MG PO TABS
1.0000 | ORAL_TABLET | Freq: Every day | ORAL | 0 refills | Status: DC
Start: 2020-04-14 — End: 2020-08-26

## 2020-04-24 DIAGNOSIS — M1611 Unilateral primary osteoarthritis, right hip: Secondary | ICD-10-CM | POA: Diagnosis not present

## 2020-04-30 ENCOUNTER — Other Ambulatory Visit (HOSPITAL_COMMUNITY): Payer: Self-pay | Admitting: Orthopedic Surgery

## 2020-04-30 ENCOUNTER — Inpatient Hospital Stay (HOSPITAL_COMMUNITY): Admission: RE | Admit: 2020-04-30 | Payer: Medicare Other | Source: Ambulatory Visit

## 2020-04-30 DIAGNOSIS — M79604 Pain in right leg: Secondary | ICD-10-CM

## 2020-05-01 ENCOUNTER — Other Ambulatory Visit: Payer: Self-pay

## 2020-05-01 ENCOUNTER — Ambulatory Visit: Payer: Self-pay | Admitting: Physician Assistant

## 2020-05-01 ENCOUNTER — Ambulatory Visit (HOSPITAL_COMMUNITY)
Admission: RE | Admit: 2020-05-01 | Discharge: 2020-05-01 | Disposition: A | Discharge status: Home / Self Care | Payer: Medicare Other | Source: Ambulatory Visit | Attending: Orthopedic Surgery | Admitting: Orthopedic Surgery

## 2020-05-01 DIAGNOSIS — M79605 Pain in left leg: Secondary | ICD-10-CM | POA: Diagnosis not present

## 2020-05-01 DIAGNOSIS — M79604 Pain in right leg: Secondary | ICD-10-CM | POA: Diagnosis not present

## 2020-05-01 NOTE — H&P (Signed)
TOTAL HIP ADMISSION H&P  Patient is admitted for right total hip arthroplasty.  Subjective:  Chief Complaint: right hip pain  HPI: Adrienne [name], 75 y.o. female, has a history of pain and functional disability in the right hip(s) due to arthritis and patient has failed non-surgical conservative treatments for greater than 12 weeks to include NSAID's and/or analgesics, corticosteriod injections, use of assistive devices and activity modification.  Onset of symptoms was gradual starting 3 years ago with gradually worsening course since that time.The patient noted no past surgery on the right hip(s).  Patient currently rates pain in the right hip at 9 out of 10 with activity. Patient has night pain, worsening of pain with activity and weight bearing and pain that interfers with activities of daily living. Patient has evidence of periarticular osteophytes and joint space narrowing by imaging studies. This condition presents safety issues increasing the risk of falls.  There is no current active infection.  Patient Active Problem List   Diagnosis Date Noted  . Leg swelling 03/31/2020  . Venous insufficiency of both lower extremities 03/31/2020  . Preoperative general physical examination 03/06/2020  . Vitamin D deficiency 03/06/2020  . Prediabetes 03/06/2020  . Knee pain, right 04/06/2019  . Left ankle sprain 04/06/2019  . Personal history of colonic polyps 07/12/2018  . Ingrown right big toenail 03/23/2017  . Hip pain, chronic, right 02/03/2016  . GERD (gastroesophageal reflux disease) 12/26/2014  . Diverticulosis of colon without hemorrhage 07/05/2014  . FH: pancreatic cancer 01/26/2014  . FH: glaucoma 03/24/2013  . Overweight (BMI 25.0-29.9) 03/13/2012  . Hyperlipidemia LDL goal <100 04/03/2009  . Essential hypertension 04/03/2009   Past Medical History:  Diagnosis Date  . Allergy   . Diverticulosis 05/2009  . Hyperlipidemia   . Hypertension     Past Surgical History:  Procedure  Laterality Date  . ABDOMINAL HYSTERECTOMY  1990   for fibroids ovaries remain   . CHOLECYSTECTOMY  1980  . COLONOSCOPY  05/2009   rare diverticula, small internal hemorrhoids. Next colonoscopy in 5-10 yrs.  . COLONOSCOPY N/A 10/23/2018   Procedure: COLONOSCOPY;  Surgeon: Danie Binder, MD;  Location: [facility];  Service: Endoscopy;  Laterality: N/A;  2:00pm  . left nipple inversion bx benign  2000  . POLYPECTOMY  10/23/2018   Procedure: POLYPECTOMY;  Surgeon: Danie Binder, MD;  Location: [facility];  Service: Endoscopy;;    Current Outpatient Medications  Medication Sig Dispense Refill Last Dose  . acetaminophen (TYLENOL) 650 MG CR tablet Take 1,950 mg by mouth daily as needed for pain.     Marland Kitchen amLODipine (NORVASC) 2.5 MG tablet Take 1 tablet (2.5 mg total) by mouth daily. 90 tablet 0   . Biotin 10 MG CAPS Take 10 mg by mouth every evening.     Maudry Mayhew, Angelica sinensis, (DONG QUAI PO) Take 1 tablet by mouth daily.      Marland Kitchen gabapentin (NEURONTIN) 100 MG capsule Take one to two capsules by mouth , at bedtime for pain (Patient taking differently: Take 200-300 mg by mouth at bedtime.) 60 capsule 3   . HYDROcodone-acetaminophen (NORCO/VICODIN) 5-325 MG tablet Take 1 tablet by mouth 2 (two) times daily as needed (pain.).     Marland Kitchen Lidocaine-Menthol (ICY HOT LIDOCAINE PLUS MENTHOL EX) Apply 1 application topically 4 (four) times daily as needed (pain.). Icy Hot Cream with Lidocaine plus Menthol Cream     . Liniments (BLUE-EMU SUPER STRENGTH EX) Apply 1 application topically 4 (four) times  daily as needed (pain.).     Marland Kitchen Liniments (PAIN RELIEF EX) Apply 1 application topically 4 (four) times daily as needed (pain.). Triderma Pain Relief Cream     . loratadine (CLARITIN) 10 MG tablet Take 10 mg by mouth daily as needed for allergies.     . meloxicam (MOBIC) 15 MG tablet Take 15 mg by mouth daily as needed for pain.     . Menthol, Topical Analgesic, (BIOFREEZE EX) Apply 1 application topically 4  (four) times daily as needed (pain.).     Marland Kitchen Menthol, Topical Analgesic, (ICE BLUE EX) Apply 1 application topically 4 (four) times daily as needed (pain.).     Marland Kitchen Menthol-Methyl Salicylate (MUSCLE RUB) 10-15 % CREA Apply 1 application topically as needed for muscle pain.     . Multiple Minerals-Vitamins (CAL MAG ZINC +D3 PO) Take 1 tablet by mouth every evening.     . Multiple Vitamin (MULTIVITAMIN WITH MINERALS) TABS tablet Take 1 tablet by mouth every evening. Women's Multivitamin 50+     . simvastatin (ZOCOR) 20 MG tablet Take 1 tablet (20 mg total) by mouth at bedtime. (Patient not taking: Reported on 05/01/2020) 90 tablet 3   . triamterene-hydrochlorothiazide (MAXZIDE-25) 37.5-25 MG tablet Take 1 tablet by mouth daily. 90 tablet 0   . TURMERIC PO Take 450 mg by mouth every evening.      No current facility-administered medications for this visit.   Allergies  Allergen Reactions  . Valsartan Shortness Of Breath  . Ace Inhibitors Cough    Social History   Tobacco Use  . Smoking status: Never Smoker  . Smokeless tobacco: Never Used  Substance Use Topics  . Alcohol use: Yes    Comment: rarely     Family History  Problem Relation Age of Onset  . Kidney failure Father   . Arthritis Father   . Heart disease Sister   . Hypertension Sister   . Arthritis Sister   . Breast cancer Sister   . Diabetes Sister   . Arthritis Sister   . Lung cancer Brother        was a smoker  . Arthritis Brother   . Arthritis Mother   . Prostate cancer Brother   . Colon cancer Neg Hx      Review of Systems  Cardiovascular: Positive for leg swelling.  Musculoskeletal: Positive for arthralgias.  All other systems reviewed and are negative.   Objective:  Physical Exam Constitutional:      General: Adrienne [name] is not in acute distress.    Appearance: Normal appearance.  HENT:     Head: Normocephalic and atraumatic.  Eyes:     Extraocular Movements: Extraocular movements intact.     Pupils: Pupils are  equal, round, and reactive to light.  Cardiovascular:     Rate and Rhythm: Normal rate and regular rhythm.     Pulses: Normal pulses.     Heart sounds: Normal heart sounds.  Pulmonary:     Effort: Pulmonary effort is normal. No respiratory distress.     Breath sounds: Normal breath sounds.  Abdominal:     General: Abdomen is flat. Bowel sounds are normal. There is no distension.     Palpations: Abdomen is soft.     Tenderness: There is no abdominal tenderness.  Musculoskeletal:     Cervical back: Normal range of motion.     Right hip: Tenderness and bony tenderness present. Decreased range of motion. Decreased strength.  Lymphadenopathy:     Cervical:  No cervical adenopathy.  Skin:    General: Skin is warm and dry.     Findings: No erythema or rash.  Neurological:     General: No focal deficit present.     Mental Status: Adrienne [name] is alert and oriented to person, place, and time.  Psychiatric:        Mood and Affect: Mood normal.        Behavior: Behavior normal.     Vital signs in last 24 hours: @VSRANGES @  Labs:   Estimated body mass index is 27.22 kg/m as calculated from the following:   Height as of 03/31/20: 5' 5.5" (1.664 m).   Weight as of 03/31/20: 75.4 kg.   Imaging Review Plain radiographs demonstrate moderate degenerative joint disease of the right hip(s). The bone quality appears to be good for age and reported activity level.      Assessment/Plan:  End stage arthritis, right hip(s)  The patient history, physical examination, clinical judgement of the provider and imaging studies are consistent with end stage degenerative joint disease of the right hip(s) and total hip arthroplasty is deemed medically necessary. The treatment options including medical management, injection therapy, arthroscopy and arthroplasty were discussed at length. The risks and benefits of total hip arthroplasty were presented and reviewed. The risks due to aseptic loosening, infection,  stiffness, dislocation/subluxation,  thromboembolic complications and other imponderables were discussed.  The patient acknowledged the explanation, agreed to proceed with the plan and consent was signed. Patient is being admitted for inpatient treatment for surgery, pain control, PT, OT, prophylactic antibiotics, VTE prophylaxis, progressive ambulation and ADL's and discharge planning.The patient is planning to be discharged home with home health services   Anticipated LOS equal to or greater than 2 midnights due to - Age 89 and older with one or more of the following:  - Obesity  - Expected need for hospital services (PT, OT, Nursing) required for safe  discharge  - Anticipated need for postoperative skilled nursing care or inpatient rehab  - Active co-morbidities: None OR   - Unanticipated findings during/Post Surgery: None  - Patient is a high risk of re-admission due to: None

## 2020-05-01 NOTE — H&P (View-Only) (Signed)
TOTAL HIP ADMISSION H&P  Patient is admitted for right total hip arthroplasty.  Subjective:  Chief Complaint: right hip pain  HPI: Adrienne [name], 75 y.o. female, has a history of pain and functional disability in the right hip(s) due to arthritis and patient has failed non-surgical conservative treatments for greater than 12 weeks to include NSAID's and/or analgesics, corticosteriod injections, use of assistive devices and activity modification.  Onset of symptoms was gradual starting 3 years ago with gradually worsening course since that time.The patient noted no past surgery on the right hip(s).  Patient currently rates pain in the right hip at 9 out of 10 with activity. Patient has night pain, worsening of pain with activity and weight bearing and pain that interfers with activities of daily living. Patient has evidence of periarticular osteophytes and joint space narrowing by imaging studies. This condition presents safety issues increasing the risk of falls.  There is no current active infection.  Patient Active Problem List   Diagnosis Date Noted   Leg swelling 03/31/2020   Venous insufficiency of both lower extremities 03/31/2020   Preoperative general physical examination 03/06/2020   Vitamin D deficiency 03/06/2020   Prediabetes 03/06/2020   Knee pain, right 04/06/2019   Left ankle sprain 04/06/2019   Personal history of colonic polyps 07/12/2018   Ingrown right big toenail 03/23/2017   Hip pain, chronic, right 02/03/2016   GERD (gastroesophageal reflux disease) 12/26/2014   Diverticulosis of colon without hemorrhage 07/05/2014   FH: pancreatic cancer 01/26/2014   FH: glaucoma 03/24/2013   Overweight (BMI 25.0-29.9) 03/13/2012   Hyperlipidemia LDL goal <100 04/03/2009   Essential hypertension 04/03/2009   Past Medical History:  Diagnosis Date   Allergy    Diverticulosis 05/2009   Hyperlipidemia    Hypertension     Past Surgical History:  Procedure  Laterality Date   ABDOMINAL HYSTERECTOMY  1990   for fibroids ovaries remain    CHOLECYSTECTOMY  1980   COLONOSCOPY  05/2009   rare diverticula, small internal hemorrhoids. Next colonoscopy in 5-10 yrs.   COLONOSCOPY N/A 10/23/2018   Procedure: COLONOSCOPY;  Surgeon: Danie Binder, MD;  Location: [facility];  Service: Endoscopy;  Laterality: N/A;  2:00pm   left nipple inversion bx benign  2000   POLYPECTOMY  10/23/2018   Procedure: POLYPECTOMY;  Surgeon: Danie Binder, MD;  Location: [facility];  Service: Endoscopy;;    Current Outpatient Medications  Medication Sig Dispense Refill Last Dose   acetaminophen (TYLENOL) 650 MG CR tablet Take 1,950 mg by mouth daily as needed for pain.      amLODipine (NORVASC) 2.5 MG tablet Take 1 tablet (2.5 mg total) by mouth daily. 90 tablet 0    Biotin 10 MG CAPS Take 10 mg by mouth every evening.      Dong Quai, Angelica sinensis, (DONG QUAI PO) Take 1 tablet by mouth daily.       gabapentin (NEURONTIN) 100 MG capsule Take one to two capsules by mouth , at bedtime for pain (Patient taking differently: Take 200-300 mg by mouth at bedtime.) 60 capsule 3    HYDROcodone-acetaminophen (NORCO/VICODIN) 5-325 MG tablet Take 1 tablet by mouth 2 (two) times daily as needed (pain.).      Lidocaine-Menthol (ICY HOT LIDOCAINE PLUS MENTHOL EX) Apply 1 application topically 4 (four) times daily as needed (pain.). Icy Hot Cream with Lidocaine plus Menthol Cream      Liniments (BLUE-EMU SUPER STRENGTH EX) Apply 1 application topically 4 (four) times  daily as needed (pain.).      Liniments (PAIN RELIEF EX) Apply 1 application topically 4 (four) times daily as needed (pain.). Triderma Pain Relief Cream      loratadine (CLARITIN) 10 MG tablet Take 10 mg by mouth daily as needed for allergies.      meloxicam (MOBIC) 15 MG tablet Take 15 mg by mouth daily as needed for pain.      Menthol, Topical Analgesic, (BIOFREEZE EX) Apply 1 application topically 4  (four) times daily as needed (pain.).      Menthol, Topical Analgesic, (ICE BLUE EX) Apply 1 application topically 4 (four) times daily as needed (pain.).      Menthol-Methyl Salicylate (MUSCLE RUB) 10-15 % CREA Apply 1 application topically as needed for muscle pain.      Multiple Minerals-Vitamins (CAL MAG ZINC +D3 PO) Take 1 tablet by mouth every evening.      Multiple Vitamin (MULTIVITAMIN WITH MINERALS) TABS tablet Take 1 tablet by mouth every evening. Women's Multivitamin 50+      simvastatin (ZOCOR) 20 MG tablet Take 1 tablet (20 mg total) by mouth at bedtime. (Patient not taking: Reported on 05/01/2020) 90 tablet 3    triamterene-hydrochlorothiazide (MAXZIDE-25) 37.5-25 MG tablet Take 1 tablet by mouth daily. 90 tablet 0    TURMERIC PO Take 450 mg by mouth every evening.      No current facility-administered medications for this visit.   Allergies  Allergen Reactions   Valsartan Shortness Of Breath   Ace Inhibitors Cough    Social History   Tobacco Use   Smoking status: Never Smoker   Smokeless tobacco: Never Used  Substance Use Topics   Alcohol use: Yes    Comment: rarely     Family History  Problem Relation Age of Onset   Kidney failure Father    Arthritis Father    Heart disease Sister    Hypertension Sister    Arthritis Sister    Breast cancer Sister    Diabetes Sister    Arthritis Sister    Lung cancer Brother        was a smoker   Arthritis Brother    Arthritis Mother    Prostate cancer Brother    Colon cancer Neg Hx      Review of Systems  Cardiovascular: Positive for leg swelling.  Musculoskeletal: Positive for arthralgias.  All other systems reviewed and are negative.   Objective:  Physical Exam Constitutional:      General: She is not in acute distress.    Appearance: Normal appearance.  HENT:     Head: Normocephalic and atraumatic.  Eyes:     Extraocular Movements: Extraocular movements intact.     Pupils: Pupils are  equal, round, and reactive to light.  Cardiovascular:     Rate and Rhythm: Normal rate and regular rhythm.     Pulses: Normal pulses.     Heart sounds: Normal heart sounds.  Pulmonary:     Effort: Pulmonary effort is normal. No respiratory distress.     Breath sounds: Normal breath sounds.  Abdominal:     General: Abdomen is flat. Bowel sounds are normal. There is no distension.     Palpations: Abdomen is soft.     Tenderness: There is no abdominal tenderness.  Musculoskeletal:     Cervical back: Normal range of motion.     Right hip: Tenderness and bony tenderness present. Decreased range of motion. Decreased strength.  Lymphadenopathy:     Cervical:  No cervical adenopathy.  Skin:    General: Skin is warm and dry.     Findings: No erythema or rash.  Neurological:     General: No focal deficit present.     Mental Status: She is alert and oriented to person, place, and time.  Psychiatric:        Mood and Affect: Mood normal.        Behavior: Behavior normal.     Vital signs in last 24 hours: @VSRANGES @  Labs:   Estimated body mass index is 27.22 kg/m as calculated from the following:   Height as of 03/31/20: 5' 5.5" (1.664 m).   Weight as of 03/31/20: 75.4 kg.   Imaging Review Plain radiographs demonstrate moderate degenerative joint disease of the right hip(s). The bone quality appears to be good for age and reported activity level.      Assessment/Plan:  End stage arthritis, right hip(s)  The patient history, physical examination, clinical judgement of the provider and imaging studies are consistent with end stage degenerative joint disease of the right hip(s) and total hip arthroplasty is deemed medically necessary. The treatment options including medical management, injection therapy, arthroscopy and arthroplasty were discussed at length. The risks and benefits of total hip arthroplasty were presented and reviewed. The risks due to aseptic loosening, infection,  stiffness, dislocation/subluxation,  thromboembolic complications and other imponderables were discussed.  The patient acknowledged the explanation, agreed to proceed with the plan and consent was signed. Patient is being admitted for inpatient treatment for surgery, pain control, PT, OT, prophylactic antibiotics, VTE prophylaxis, progressive ambulation and ADL's and discharge planning.The patient is planning to be discharged home with home health services   Anticipated LOS equal to or greater than 2 midnights due to - Age 68 and older with one or more of the following:  - Obesity  - Expected need for hospital services (PT, OT, Nursing) required for safe  discharge  - Anticipated need for postoperative skilled nursing care or inpatient rehab  - Active co-morbidities: None OR   - Unanticipated findings during/Post Surgery: None  - Patient is a high risk of re-admission due to: None

## 2020-05-02 NOTE — Progress Notes (Addendum)
PCP - Clearance Ihor Dow MD11-9-21 epic Cardiologist - no  PPM/ICD -  Device Orders -  Rep Notified -   Chest x-ray -  EKG - 05-07-20 Stress Test -  ECHO -  Cardiac Cath -   Sleep Study -  CPAP -   Fasting Blood Sugar -  Checks Blood Sugar _____ times a day  Blood Thinner Instructions: Aspirin Instructions:  ERAS Protcol - PRE-SURGERY Ensure  COVID TEST- 1-18  Activity--no SOB with walking around house every hour Anesthesia review: HTN  Patient denies shortness of breath, fever, cough and chest pain at PAT appointment  NONE   All instructions explained to the patient, with a verbal understanding of the material. Patient agrees to go over the instructions while at home for a better understanding. Patient also instructed to self quarantine after being tested for COVID-19. The opportunity to ask questions was provided.

## 2020-05-02 NOTE — Patient Instructions (Signed)
DUE TO COVID-19 ONLY ONE VISITOR IS ALLOWED TO COME WITH YOU AND STAY IN THE WAITING ROOM ONLY DURING PRE OP AND PROCEDURE DAY OF SURGERY. THE 1 VISITOR  MAY VISIT WITH YOU AFTER SURGERY IN YOUR PRIVATE ROOM DURING VISITING HOURS ONLY!  YOU NEED TO HAVE A COVID 19 TEST ON_1-18-22______ @_______ , THIS TEST MUST BE DONE BEFORE SURGERY,  COVID TESTING SITE [address] [place] [address], IT IS ON THE RIGHT GOING OUT [address] APPROXIMATELY  2 MINUTES PAST [employer] ON THE RIGHT. ONCE YOUR COVID TEST IS COMPLETED,  PLEASE BEGIN THE QUARANTINE INSTRUCTIONS AS OUTLINED IN YOUR HANDOUT.                Adrienne [name]  05/02/2020   Your procedure is scheduled on: 05-16-20   Report to [facility] Main  Entrance   Report to Short stay  at   0530 AM     Call this number if you have problems the morning of surgery [phone]    Remember: NO SOLID FOOD AFTER MIDNIGHT THE NIGHT PRIOR TO SURGERY. NOTHING BY MOUTH EXCEPT CLEAR LIQUIDS UNTIL    0430 am . PLEASE FINISH ENSURE DRINK PER SURGEON ORDER  WHICH NEEDS TO BE COMPLETED AT        0430 am then nothing by mouth .     CLEAR LIQUID DIET                                                                Water Black Coffee and tea, regular and decaf                           Plain Jell-O any favor except red or purple                                            Fruit ices (not with fruit pulp)                                     Iced Popsicles                                    Carbonated beverages, regular and diet                                    Cranberry, grape and apple juices Sports drinks like Gatorade Lightly seasoned clear broth or consume(fat free) Sugar, honey syrup  _____________________________________________________________________     BRUSH YOUR TEETH MORNING OF SURGERY AND RINSE YOUR MOUTH OUT, NO CHEWING GUM CANDY OR MINTS.     Take these medicines the morning of surgery with A SIP OF  WATER: Hydrocodone if needed                                 You  may not have any metal on your body including hair pins and              piercings  Do not wear jewelry, make-up, lotions, powders or perfumes, deodorant             Do not wear nail polish on your fingernails.  Do not shave  48 hours prior to surgery.     Do not bring valuables to the hospital. [place].  Contacts, dentures or bridgework may not be worn into surgery.      Patients discharged the day of surgery will not be allowed to drive home. IF YOU ARE HAVING SURGERY AND GOING HOME THE SAME DAY, YOU MUST HAVE AN ADULT TO DRIVE YOU HOME AND BE WITH YOU FOR 24 HOURS. YOU MAY GO HOME BY TAXI OR UBER OR ORTHERWISE, BUT AN ADULT MUST ACCOMPANY YOU HOME AND STAY WITH YOU FOR 24 HOURS.  Name and phone number of your driver:  Special Instructions: N/A              Please read over the following fact sheets you were given: _____________________________________________________________________             [facility] - Preparing for Surgery Before surgery, you can play an important role.  Because skin is not sterile, your skin needs to be as free of germs as possible.  You can reduce the number of germs on your skin by washing with CHG (chlorahexidine gluconate) soap before surgery.  CHG is an antiseptic cleaner which kills germs and bonds with the skin to continue killing germs even after washing. Please DO NOT use if you have an allergy to CHG or antibacterial soaps.  If your skin becomes reddened/irritated stop using the CHG and inform your nurse when you arrive at Short Stay. Do not shave (including legs and underarms) for at least 48 hours prior to the first CHG shower.  You may shave your face/neck. Please follow these instructions carefully:  1.  Shower with CHG Soap the night before surgery and the  morning of Surgery.  2.  If you choose to wash your hair, wash your hair  first as usual with your  normal  shampoo.  3.  After you shampoo, rinse your hair and body thoroughly to remove the  shampoo.                           4.  Use CHG as you would any other liquid soap.  You can apply chg directly  to the skin and wash                       Gently with a scrungie or clean washcloth.  5.  Apply the CHG Soap to your body ONLY FROM THE NECK DOWN.   Do not use on face/ open                           Wound or open sores. Avoid contact with eyes, ears mouth and genitals (private parts).                       Wash face,  Genitals (private parts) with your normal soap.  6.  Wash thoroughly, paying special attention to the area where your surgery  will be performed.  7.  Thoroughly rinse your body with warm water from the neck down.  8.  DO NOT shower/wash with your normal soap after using and rinsing off  the CHG Soap.                9.  Pat yourself dry with a clean towel.            10.  Wear clean pajamas.            11.  Place clean sheets on your bed the night of your first shower and do not  sleep with pets. Day of Surgery : Do not apply any lotions/deodorants the morning of surgery.  Please wear clean clothes to the hospital/surgery center.  FAILURE TO FOLLOW THESE INSTRUCTIONS MAY RESULT IN THE CANCELLATION OF YOUR SURGERY PATIENT SIGNATURE_________________________________  NURSE SIGNATURE__________________________________  ________________________________________________________________________   Adrienne [name]  An incentive spirometer is a tool that can help keep your lungs clear and active. This tool measures how well you are filling your lungs with each breath. Taking long deep breaths may help reverse or decrease the chance of developing breathing (pulmonary) problems (especially infection) following:  A long period of time when you are unable to move or be active. BEFORE THE PROCEDURE   If the spirometer includes an indicator to  show your best effort, your nurse or respiratory therapist will set it to a desired goal.  If possible, sit up straight or lean slightly forward. Try not to slouch.  Hold the incentive spirometer in an upright position. INSTRUCTIONS FOR USE  1. Sit on the edge of your bed if possible, or sit up as far as you can in bed or on a chair. 2. Hold the incentive spirometer in an upright position. 3. Breathe out normally. 4. Place the mouthpiece in your mouth and seal your lips tightly around it. 5. Breathe in slowly and as deeply as possible, raising the piston or the ball toward the top of the column. 6. Hold your breath for 3-5 seconds or for as long as possible. Allow the piston or ball to fall to the bottom of the column. 7. Remove the mouthpiece from your mouth and breathe out normally. 8. Rest for a few seconds and repeat Steps 1 through 7 at least 10 times every 1-2 hours when you are awake. Take your time and take a few normal breaths between deep breaths. 9. The spirometer may include an indicator to show your best effort. Use the indicator as a goal to work toward during each repetition. 10. After each set of 10 deep breaths, practice coughing to be sure your lungs are clear. If you have an incision (the cut made at the time of surgery), support your incision when coughing by placing a pillow or rolled up towels firmly against it. Once you are able to get out of bed, walk around indoors and cough well. You may stop using the incentive spirometer when instructed by your caregiver.  RISKS AND COMPLICATIONS  Take your time so you do not get dizzy or light-headed.  If you are in pain, you may need to take or ask for pain medication before doing incentive spirometry. It is harder to take a deep breath if you are having pain. AFTER USE  Rest and breathe slowly and easily.  It can be helpful to keep track of a log of your  progress. Your caregiver can provide you with a simple table to help with  this. If you are using the spirometer at home, follow these instructions: [place] IF:   You are having difficultly using the spirometer.  You have trouble using the spirometer as often as instructed.  Your pain medication is not giving enough relief while using the spirometer.  You develop fever of 100.5 F (38.1 C) or higher. SEEK IMMEDIATE MEDICAL CARE IF:   You cough up bloody sputum that had not been present before.  You develop fever of 102 F (38.9 C) or greater.  You develop worsening pain at or near the incision site. MAKE SURE YOU:   Understand these instructions.  Will watch your condition.  Will get help right away if you are not doing well or get worse. Document Released: 08/23/2006 Document Revised: 07/05/2011 Document Reviewed: 10/24/2006 [facility] Patient Information 2014 [place], [place].   ________________________________________________________________________

## 2020-05-05 NOTE — Care Plan (Signed)
Ortho Bundle Case Management Note  Patient Details  Name: Valari Hurtubise MRN: [id number] Date of Birth: 08/17/1945  Met with patient in the office prior to surgery. She will discharge to home with family to assist. She has equipment. HHPT referral to [facility] and OPPT set up with [facility]. Patient and MD in agreement with plan and choice offered                   DME Arranged:    DME Agency:     HH Arranged:  PT HH Agency:  [facility] (formerly [facility])  Additional Comments: Please contact me with any questions of if this plan should need to change.  Renee Angiulli,  RN,BSN,MHA,CCM  [facility]  [phone] 05/05/2020, 11:23 AM   

## 2020-05-07 ENCOUNTER — Other Ambulatory Visit: Payer: Self-pay

## 2020-05-07 ENCOUNTER — Encounter (HOSPITAL_COMMUNITY)
Admission: RE | Admit: 2020-05-07 | Discharge: 2020-05-07 | Disposition: A | Discharge status: Home / Self Care | Payer: Medicare Other | Source: Ambulatory Visit | Attending: Orthopedic Surgery | Admitting: Orthopedic Surgery

## 2020-05-07 ENCOUNTER — Encounter (HOSPITAL_COMMUNITY): Payer: Self-pay

## 2020-05-07 DIAGNOSIS — Z01818 Encounter for other preprocedural examination: Secondary | ICD-10-CM | POA: Diagnosis not present

## 2020-05-07 HISTORY — DX: Gastro-esophageal reflux disease without esophagitis: K21.9

## 2020-05-07 HISTORY — DX: Unspecified osteoarthritis, unspecified site: M19.90

## 2020-05-07 LAB — URINALYSIS, ROUTINE W REFLEX MICROSCOPIC
Bilirubin Urine: NEGATIVE
Glucose, UA: NEGATIVE mg/dL
Hgb urine dipstick: NEGATIVE
Ketones, ur: NEGATIVE mg/dL
Leukocytes,Ua: NEGATIVE
Nitrite: NEGATIVE
Protein, ur: NEGATIVE mg/dL
Specific Gravity, Urine: 1.014 (ref 1.005–1.030)
pH: 8 (ref 5.0–8.0)

## 2020-05-07 LAB — COMPREHENSIVE METABOLIC PANEL
ALT: 15 U/L (ref 0–44)
AST: 21 U/L (ref 15–41)
Albumin: 4 g/dL (ref 3.5–5.0)
Alkaline Phosphatase: 92 U/L (ref 38–126)
Anion gap: 10 (ref 5–15)
BUN: 19 mg/dL (ref 8–23)
CO2: 28 mmol/L (ref 22–32)
Calcium: 10.2 mg/dL (ref 8.9–10.3)
Chloride: 102 mmol/L (ref 98–111)
Creatinine, Ser: 0.86 mg/dL (ref 0.44–1.00)
GFR, Estimated: >60 mL/min (ref >60)
Glucose, Bld: 85 mg/dL (ref 70–99)
Potassium: 4.2 mmol/L (ref 3.5–5.1)
Sodium: 140 mmol/L (ref 135–145)
Total Bilirubin: 0.6 mg/dL (ref 0.3–1.2)
Total Protein: 6.8 g/dL (ref 6.5–8.1)

## 2020-05-07 LAB — CBC WITH DIFFERENTIAL/PLATELET
Abs Immature Granulocytes: 0.01 10*3/uL (ref 0.00–0.07)
Basophils Absolute: 0 10*3/uL (ref 0.0–0.1)
Basophils Relative: 1 %
Eosinophils Absolute: 0.1 10*3/uL (ref 0.0–0.5)
Eosinophils Relative: 2 %
HCT: 37.7 % (ref 36.0–46.0)
Hemoglobin: 12.2 g/dL (ref 12.0–15.0)
Immature Granulocytes: 0 %
Lymphocytes Relative: 43 %
Lymphs Abs: 2.8 10*3/uL (ref 0.7–4.0)
MCH: 30.7 pg (ref 26.0–34.0)
MCHC: 32.4 g/dL (ref 30.0–36.0)
MCV: 94.7 fL (ref 80.0–100.0)
Monocytes Absolute: 0.6 10*3/uL (ref 0.1–1.0)
Monocytes Relative: 9 %
Neutro Abs: 3 10*3/uL (ref 1.7–7.7)
Neutrophils Relative %: 45 %
Platelets: 271 10*3/uL (ref 150–400)
RBC: 3.98 MIL/uL (ref 3.87–5.11)
RDW: 13.1 % (ref 11.5–15.5)
WBC: 6.6 10*3/uL (ref 4.0–10.5)
nRBC: 0 % (ref 0.0–0.2)

## 2020-05-07 LAB — SURGICAL PCR SCREEN
MRSA, PCR: NEGATIVE
Staphylococcus aureus: NEGATIVE

## 2020-05-07 LAB — PROTIME-INR
INR: 1 (ref 0.8–1.2)
Prothrombin Time: 12.4 seconds (ref 11.4–15.2)

## 2020-05-07 LAB — APTT: aPTT: 29 seconds (ref 24–36)

## 2020-05-08 LAB — URINE CULTURE: Culture: NO GROWTH

## 2020-05-13 ENCOUNTER — Other Ambulatory Visit (HOSPITAL_COMMUNITY)
Admission: RE | Admit: 2020-05-13 | Discharge: 2020-05-13 | Disposition: A | Discharge status: Home / Self Care | Payer: Medicare Other | Source: Ambulatory Visit | Attending: Orthopedic Surgery | Admitting: Orthopedic Surgery

## 2020-05-13 DIAGNOSIS — Z20822 Contact with and (suspected) exposure to covid-19: Secondary | ICD-10-CM | POA: Insufficient documentation

## 2020-05-13 DIAGNOSIS — Z01818 Encounter for other preprocedural examination: Secondary | ICD-10-CM | POA: Diagnosis not present

## 2020-05-14 LAB — SARS CORONAVIRUS 2 (TAT 6-24 HRS): SARS Coronavirus 2: NEGATIVE

## 2020-05-15 MED ORDER — BUPIVACAINE LIPOSOME 1.3 % IJ SUSP
10.0000 mL | Freq: Once | INTRAMUSCULAR | Status: DC
  Filled 2020-05-15: qty 10

## 2020-05-15 MED ORDER — TRANEXAMIC ACID 1000 MG/10ML IV SOLN
2000.0000 mg | INTRAVENOUS | Status: DC
  Filled 2020-05-15 (×2): qty 20

## 2020-05-15 NOTE — Anesthesia Preprocedure Evaluation (Addendum)
Anesthesia Evaluation  Patient identified by MRN, date of birth, ID band Patient awake    Reviewed: Allergy & Precautions, NPO status , Patient's Chart, lab work & pertinent test results, reviewed documented beta blocker date and time   Airway Mallampati: II  TM Distance: >3 FB Neck ROM: Full    Dental no notable dental hx.    Pulmonary neg pulmonary ROS,    Pulmonary exam normal breath sounds clear to auscultation       Cardiovascular hypertension, Pt. on medications Normal cardiovascular exam Rhythm:Regular Rate:Normal  EKG 03/04/20 SB, LVH by voltage, poss AWMI   Neuro/Psych negative neurological ROS  negative psych ROS   GI/Hepatic Neg liver ROS, GERD  Medicated,Hx/o colon polyps   Endo/Other  Hyperlipidemia  Renal/GU negative Renal ROS  negative genitourinary   Musculoskeletal  (+) Arthritis , Osteoarthritis,  OA right hip   Abdominal   Peds  Hematology negative hematology ROS (+)   Anesthesia Other Findings   Reproductive/Obstetrics                            Anesthesia Physical Anesthesia Plan  ASA: III  Anesthesia Plan: Spinal   Post-op Pain Management:    Induction:   PONV Risk Score and Plan: 3 and Propofol infusion, Treatment may vary due to age or medical condition and Ondansetron  Airway Management Planned: Natural Airway and Simple Face Mask  Additional Equipment:   Intra-op Plan:   Post-operative Plan:   Informed Consent: I have reviewed the patients History and Physical, chart, labs and discussed the procedure including the risks, benefits and alternatives for the proposed anesthesia with the patient or authorized representative who has indicated his/her understanding and acceptance.     Dental advisory given  Plan Discussed with: CRNA and Anesthesiologist  Anesthesia Plan Comments:        Anesthesia Quick Evaluation

## 2020-05-16 ENCOUNTER — Ambulatory Visit (HOSPITAL_COMMUNITY): Payer: Medicare Other | Admitting: Anesthesiology

## 2020-05-16 ENCOUNTER — Encounter (HOSPITAL_COMMUNITY)
Admission: RE | Disposition: A | Discharge status: Home / Self Care | Payer: Self-pay | Source: Ambulatory Visit | Attending: Orthopedic Surgery

## 2020-05-16 ENCOUNTER — Ambulatory Visit (HOSPITAL_COMMUNITY)
Admission: RE | Admit: 2020-05-16 | Discharge: 2020-05-16 | Disposition: A | Discharge status: Home / Self Care | Payer: Medicare Other | Source: Ambulatory Visit | Attending: Orthopedic Surgery | Admitting: Orthopedic Surgery

## 2020-05-16 ENCOUNTER — Ambulatory Visit (HOSPITAL_COMMUNITY): Payer: Medicare Other

## 2020-05-16 ENCOUNTER — Encounter (HOSPITAL_COMMUNITY): Payer: Self-pay | Admitting: Orthopedic Surgery

## 2020-05-16 DIAGNOSIS — Z791 Long term (current) use of non-steroidal anti-inflammatories (NSAID): Secondary | ICD-10-CM | POA: Insufficient documentation

## 2020-05-16 DIAGNOSIS — Z888 Allergy status to other drugs, medicaments and biological substances status: Secondary | ICD-10-CM | POA: Diagnosis not present

## 2020-05-16 DIAGNOSIS — Z8249 Family history of ischemic heart disease and other diseases of the circulatory system: Secondary | ICD-10-CM | POA: Insufficient documentation

## 2020-05-16 DIAGNOSIS — Z833 Family history of diabetes mellitus: Secondary | ICD-10-CM | POA: Insufficient documentation

## 2020-05-16 DIAGNOSIS — Z803 Family history of malignant neoplasm of breast: Secondary | ICD-10-CM | POA: Diagnosis not present

## 2020-05-16 DIAGNOSIS — Z471 Aftercare following joint replacement surgery: Secondary | ICD-10-CM | POA: Diagnosis not present

## 2020-05-16 DIAGNOSIS — E785 Hyperlipidemia, unspecified: Secondary | ICD-10-CM | POA: Diagnosis not present

## 2020-05-16 DIAGNOSIS — Z8349 Family history of other endocrine, nutritional and metabolic diseases: Secondary | ICD-10-CM | POA: Insufficient documentation

## 2020-05-16 DIAGNOSIS — R7303 Prediabetes: Secondary | ICD-10-CM | POA: Insufficient documentation

## 2020-05-16 DIAGNOSIS — Z79899 Other long term (current) drug therapy: Secondary | ICD-10-CM | POA: Diagnosis not present

## 2020-05-16 DIAGNOSIS — Z8042 Family history of malignant neoplasm of prostate: Secondary | ICD-10-CM | POA: Diagnosis not present

## 2020-05-16 DIAGNOSIS — Z841 Family history of disorders of kidney and ureter: Secondary | ICD-10-CM | POA: Insufficient documentation

## 2020-05-16 DIAGNOSIS — Z800 Family history of malignant neoplasm of digestive organs: Secondary | ICD-10-CM | POA: Diagnosis not present

## 2020-05-16 DIAGNOSIS — Z801 Family history of malignant neoplasm of trachea, bronchus and lung: Secondary | ICD-10-CM | POA: Insufficient documentation

## 2020-05-16 DIAGNOSIS — M1611 Unilateral primary osteoarthritis, right hip: Secondary | ICD-10-CM | POA: Insufficient documentation

## 2020-05-16 DIAGNOSIS — Z8261 Family history of arthritis: Secondary | ICD-10-CM | POA: Insufficient documentation

## 2020-05-16 DIAGNOSIS — Z83511 Family history of glaucoma: Secondary | ICD-10-CM | POA: Insufficient documentation

## 2020-05-16 DIAGNOSIS — Z96641 Presence of right artificial hip joint: Secondary | ICD-10-CM | POA: Diagnosis not present

## 2020-05-16 DIAGNOSIS — I10 Essential (primary) hypertension: Secondary | ICD-10-CM | POA: Diagnosis not present

## 2020-05-16 DIAGNOSIS — K219 Gastro-esophageal reflux disease without esophagitis: Secondary | ICD-10-CM | POA: Diagnosis not present

## 2020-05-16 DIAGNOSIS — M25851 Other specified joint disorders, right hip: Secondary | ICD-10-CM | POA: Diagnosis not present

## 2020-05-16 HISTORY — PX: TOTAL HIP ARTHROPLASTY: SHX124

## 2020-05-16 LAB — TYPE AND SCREEN
ABO/RH(D): O POS
Antibody Screen: NEGATIVE

## 2020-05-16 LAB — ABO/RH: ABO/RH(D): O POS

## 2020-05-16 SURGERY — ARTHROPLASTY, HIP, TOTAL,POSTERIOR APPROACH
Anesthesia: Spinal | Site: Hip | Laterality: Right

## 2020-05-16 MED ORDER — ACETAMINOPHEN 325 MG PO TABS
325.0000 mg | ORAL_TABLET | Freq: Four times a day (QID) | ORAL | Status: DC | PRN
Start: 2020-05-17 — End: 2020-05-16

## 2020-05-16 MED ORDER — PROPOFOL 10 MG/ML IV BOLUS
INTRAVENOUS | Status: AC
  Filled 2020-05-16: qty 20

## 2020-05-16 MED ORDER — METOCLOPRAMIDE HCL 5 MG/ML IJ SOLN
5.0000 mg | Freq: Three times a day (TID) | INTRAMUSCULAR | Status: DC | PRN
Start: 2020-05-16 — End: 2020-05-16

## 2020-05-16 MED ORDER — PHENYLEPHRINE HCL-NACL 10-0.9 MG/250ML-% IV SOLN
INTRAVENOUS | Status: DC | PRN
  Administered 2020-05-16: 15 ug/min via INTRAVENOUS

## 2020-05-16 MED ORDER — PROPOFOL 10 MG/ML IV BOLUS
INTRAVENOUS | Status: DC | PRN
  Administered 2020-05-16 (×2): 20 mg via INTRAVENOUS

## 2020-05-16 MED ORDER — FENTANYL CITRATE (PF) 100 MCG/2ML IJ SOLN
INTRAMUSCULAR | Status: AC
  Administered 2020-05-16: 50 ug
  Filled 2020-05-16: qty 2

## 2020-05-16 MED ORDER — MIDAZOLAM HCL 5 MG/5ML IJ SOLN
INTRAMUSCULAR | Status: DC | PRN
  Administered 2020-05-16: 1 mg via INTRAVENOUS

## 2020-05-16 MED ORDER — DEXAMETHASONE SODIUM PHOSPHATE 10 MG/ML IJ SOLN
INTRAMUSCULAR | Status: DC | PRN
  Administered 2020-05-16: 4 mg via INTRAVENOUS

## 2020-05-16 MED ORDER — HYDROMORPHONE HCL 1 MG/ML IJ SOLN: 0.5000 mg | INTRAMUSCULAR | Status: DC | PRN

## 2020-05-16 MED ORDER — ONDANSETRON HCL 4 MG/2ML IJ SOLN: 4.0000 mg | Freq: Once | INTRAMUSCULAR | Status: DC | PRN

## 2020-05-16 MED ORDER — ACETAMINOPHEN ER 650 MG PO TBCR: 1300.0000 mg | EXTENDED_RELEASE_TABLET | Freq: Every day | ORAL | 0 refills | Status: AC | PRN

## 2020-05-16 MED ORDER — ONDANSETRON HCL 4 MG/2ML IJ SOLN
INTRAMUSCULAR | Status: AC
  Filled 2020-05-16: qty 2

## 2020-05-16 MED ORDER — METOCLOPRAMIDE HCL 5 MG PO TABS
5.0000 mg | ORAL_TABLET | Freq: Three times a day (TID) | ORAL | Status: DC | PRN
  Filled 2020-05-16: qty 2

## 2020-05-16 MED ORDER — ONDANSETRON HCL 4 MG PO TABS
4.0000 mg | ORAL_TABLET | Freq: Four times a day (QID) | ORAL | Status: DC | PRN
  Filled 2020-05-16: qty 1

## 2020-05-16 MED ORDER — SODIUM CHLORIDE (PF) 0.9 % IJ SOLN
INTRAMUSCULAR | Status: DC | PRN
  Administered 2020-05-16: 20 mL

## 2020-05-16 MED ORDER — LACTATED RINGERS IV SOLN
INTRAVENOUS | Status: DC
  Administered 2020-05-16: 1000 mL via INTRAVENOUS

## 2020-05-16 MED ORDER — CEFAZOLIN SODIUM-DEXTROSE 1-4 GM/50ML-% IV SOLN
1.0000 g | Freq: Four times a day (QID) | INTRAVENOUS | Status: DC
  Administered 2020-05-16: 1 g via INTRAVENOUS

## 2020-05-16 MED ORDER — PROPOFOL 500 MG/50ML IV EMUL
INTRAVENOUS | Status: DC | PRN
  Administered 2020-05-16: 100 ug/kg/min via INTRAVENOUS

## 2020-05-16 MED ORDER — TRANEXAMIC ACID-NACL 1000-0.7 MG/100ML-% IV SOLN
INTRAVENOUS | Status: AC
  Filled 2020-05-16: qty 100

## 2020-05-16 MED ORDER — LACTATED RINGERS IV BOLUS
500.0000 mL | Freq: Once | INTRAVENOUS | Status: AC
  Administered 2020-05-16: 500 mL via INTRAVENOUS

## 2020-05-16 MED ORDER — ALBUMIN HUMAN 5 % IV SOLN
INTRAVENOUS | Status: AC
  Filled 2020-05-16: qty 500

## 2020-05-16 MED ORDER — TRANEXAMIC ACID 1000 MG/10ML IV SOLN: 2000.0000 mg | Freq: Once | INTRAVENOUS | Status: DC

## 2020-05-16 MED ORDER — SODIUM CHLORIDE (PF) 0.9 % IJ SOLN
INTRAMUSCULAR | Status: AC
  Filled 2020-05-16: qty 20

## 2020-05-16 MED ORDER — ACETAMINOPHEN 500 MG PO TABS
1000.0000 mg | ORAL_TABLET | Freq: Once | ORAL | Status: AC
  Administered 2020-05-16: 1000 mg via ORAL
  Filled 2020-05-16: qty 2

## 2020-05-16 MED ORDER — TRANEXAMIC ACID 1000 MG/10ML IV SOLN
INTRAVENOUS | Status: DC | PRN
  Administered 2020-05-16: 2000 mg via TOPICAL

## 2020-05-16 MED ORDER — ORAL CARE MOUTH RINSE: 15.0000 mL | Freq: Once | OROMUCOSAL | Status: AC

## 2020-05-16 MED ORDER — FENTANYL CITRATE (PF) 100 MCG/2ML IJ SOLN
INTRAMUSCULAR | Status: DC | PRN
  Administered 2020-05-16 (×2): 50 ug via INTRAVENOUS

## 2020-05-16 MED ORDER — DEXAMETHASONE SODIUM PHOSPHATE 10 MG/ML IJ SOLN
INTRAMUSCULAR | Status: AC
  Filled 2020-05-16: qty 1

## 2020-05-16 MED ORDER — OXYCODONE HCL 5 MG PO TABS: ORAL_TABLET | ORAL | 0 refills | Status: DC

## 2020-05-16 MED ORDER — BUPIVACAINE-EPINEPHRINE (PF) 0.25% -1:200000 IJ SOLN
INTRAMUSCULAR | Status: AC
  Filled 2020-05-16: qty 30

## 2020-05-16 MED ORDER — ALBUMIN HUMAN 5 % IV SOLN: INTRAVENOUS | Status: DC | PRN

## 2020-05-16 MED ORDER — FENTANYL CITRATE (PF) 100 MCG/2ML IJ SOLN
INTRAMUSCULAR | Status: AC
  Filled 2020-05-16: qty 2

## 2020-05-16 MED ORDER — OXYCODONE HCL 5 MG PO TABS
5.0000 mg | ORAL_TABLET | ORAL | Status: DC | PRN
  Administered 2020-05-16: 5 mg via ORAL

## 2020-05-16 MED ORDER — DOCUSATE SODIUM 100 MG PO CAPS: 100.0000 mg | ORAL_CAPSULE | Freq: Every day | ORAL | 2 refills | Status: DC | PRN

## 2020-05-16 MED ORDER — MIDAZOLAM HCL 2 MG/2ML IJ SOLN
INTRAMUSCULAR | Status: AC
  Filled 2020-05-16: qty 2

## 2020-05-16 MED ORDER — SODIUM CHLORIDE 0.9 % IR SOLN
Status: DC | PRN
  Administered 2020-05-16: 1000 mL

## 2020-05-16 MED ORDER — TRANEXAMIC ACID-NACL 1000-0.7 MG/100ML-% IV SOLN
1000.0000 mg | INTRAVENOUS | Status: AC
  Administered 2020-05-16: 1000 mg via INTRAVENOUS
  Filled 2020-05-16: qty 100

## 2020-05-16 MED ORDER — ONDANSETRON HCL 4 MG/2ML IJ SOLN
INTRAMUSCULAR | Status: DC | PRN
  Administered 2020-05-16: 4 mg via INTRAVENOUS

## 2020-05-16 MED ORDER — ACETAMINOPHEN 500 MG PO TABS: 1000.0000 mg | ORAL_TABLET | Freq: Four times a day (QID) | ORAL | Status: DC

## 2020-05-16 MED ORDER — ASPIRIN EC 81 MG PO TBEC: 81.0000 mg | DELAYED_RELEASE_TABLET | Freq: Two times a day (BID) | ORAL | 0 refills | Status: AC

## 2020-05-16 MED ORDER — PROPOFOL 1000 MG/100ML IV EMUL
INTRAVENOUS | Status: AC
  Filled 2020-05-16: qty 100

## 2020-05-16 MED ORDER — BUPIVACAINE LIPOSOME 1.3 % IJ SUSP
INTRAMUSCULAR | Status: DC | PRN
  Administered 2020-05-16: 10 mL

## 2020-05-16 MED ORDER — BUPIVACAINE-EPINEPHRINE 0.25% -1:200000 IJ SOLN
INTRAMUSCULAR | Status: DC | PRN
  Administered 2020-05-16: 20 mL

## 2020-05-16 MED ORDER — LACTATED RINGERS IV BOLUS
250.0000 mL | Freq: Once | INTRAVENOUS | Status: AC
  Administered 2020-05-16: 250 mL via INTRAVENOUS

## 2020-05-16 MED ORDER — POVIDONE-IODINE 10 % EX SWAB
2.0000 "application " | Freq: Once | CUTANEOUS | Status: AC
  Administered 2020-05-16: 2 via TOPICAL

## 2020-05-16 MED ORDER — TRANEXAMIC ACID-NACL 1000-0.7 MG/100ML-% IV SOLN
1000.0000 mg | Freq: Once | INTRAVENOUS | Status: AC
  Administered 2020-05-16: 1000 mg via INTRAVENOUS

## 2020-05-16 MED ORDER — CEFAZOLIN SODIUM-DEXTROSE 1-4 GM/50ML-% IV SOLN
INTRAVENOUS | Status: AC
  Filled 2020-05-16: qty 50

## 2020-05-16 MED ORDER — ONDANSETRON HCL 4 MG/2ML IJ SOLN: 4.0000 mg | Freq: Four times a day (QID) | INTRAMUSCULAR | Status: DC | PRN

## 2020-05-16 MED ORDER — SODIUM CHLORIDE (PF) 0.9 % IJ SOLN
INTRAMUSCULAR | Status: AC
  Filled 2020-05-16: qty 10

## 2020-05-16 MED ORDER — CEFAZOLIN SODIUM-DEXTROSE 2-4 GM/100ML-% IV SOLN
2.0000 g | INTRAVENOUS | Status: AC
  Administered 2020-05-16: 2 g via INTRAVENOUS
  Filled 2020-05-16: qty 100

## 2020-05-16 MED ORDER — FENTANYL CITRATE (PF) 100 MCG/2ML IJ SOLN
25.0000 ug | INTRAMUSCULAR | Status: DC | PRN
  Administered 2020-05-16 (×2): 50 ug via INTRAVENOUS

## 2020-05-16 MED ORDER — OXYCODONE HCL 5 MG PO TABS
ORAL_TABLET | ORAL | Status: AC
  Filled 2020-05-16: qty 1

## 2020-05-16 MED ORDER — BUPIVACAINE IN DEXTROSE 0.75-8.25 % IT SOLN
INTRATHECAL | Status: DC | PRN
  Administered 2020-05-16: 1.8 mL via INTRATHECAL

## 2020-05-16 MED ORDER — WATER FOR IRRIGATION, STERILE IR SOLN
Status: DC | PRN
  Administered 2020-05-16: 2000 mL

## 2020-05-16 MED ORDER — PHENYLEPHRINE HCL (PRESSORS) 10 MG/ML IV SOLN
INTRAVENOUS | Status: AC
  Filled 2020-05-16: qty 1

## 2020-05-16 MED ORDER — CHLORHEXIDINE GLUCONATE 0.12 % MT SOLN
15.0000 mL | Freq: Once | OROMUCOSAL | Status: AC
  Administered 2020-05-16: 15 mL via OROMUCOSAL

## 2020-05-16 MED ORDER — SODIUM CHLORIDE 0.9 % IV SOLN: INTRAVENOUS | Status: DC

## 2020-05-16 SURGICAL SUPPLY — 55 items
BAG DECANTER FOR FLEXI CONT (MISCELLANEOUS) ×2 IMPLANT
BAG ZIPLOCK 12X15 (MISCELLANEOUS) ×2 IMPLANT
BENZOIN TINCTURE PRP APPL 2/3 (GAUZE/BANDAGES/DRESSINGS) ×2 IMPLANT
BLADE SAW SAG 73X25 THK (BLADE) ×1
BLADE SAW SGTL 73X25 THK (BLADE) ×1 IMPLANT
CLSR STERI-STRIP ANTIMIC 1/2X4 (GAUZE/BANDAGES/DRESSINGS) ×4 IMPLANT
COVER SURGICAL LIGHT HANDLE (MISCELLANEOUS) ×2 IMPLANT
COVER WAND RF STERILE (DRAPES) IMPLANT
DECANTER SPIKE VIAL GLASS SM (MISCELLANEOUS) ×6 IMPLANT
DRAPE 3/4 80X56 (DRAPES) ×2 IMPLANT
DRAPE INCISE IOBAN 66X45 STRL (DRAPES) ×2 IMPLANT
DRAPE ORTHO SPLIT 77X108 STRL (DRAPES) ×2
DRAPE POUCH INSTRU U-SHP 10X18 (DRAPES) ×2 IMPLANT
DRAPE SURG ORHT 6 SPLT 77X108 (DRAPES) ×2 IMPLANT
DRAPE U-SHAPE 47X51 STRL (DRAPES) ×2 IMPLANT
DRESSING AQUACEL AG SP 3.5X10 (GAUZE/BANDAGES/DRESSINGS) ×1 IMPLANT
DRSG AQUACEL AG SP 3.5X10 (GAUZE/BANDAGES/DRESSINGS) ×2
DURAPREP 26ML APPLICATOR (WOUND CARE) ×2 IMPLANT
ELECT BLADE TIP CTD 4 INCH (ELECTRODE) ×2 IMPLANT
ELECT REM PT RETURN 15FT ADLT (MISCELLANEOUS) ×2 IMPLANT
FACESHIELD WRAPAROUND (MASK) ×6 IMPLANT
GLOVE SRG 8 PF TXTR STRL LF DI (GLOVE) ×2 IMPLANT
GLOVE SURG ORTHO LTX SZ8 (GLOVE) ×2 IMPLANT
GLOVE SURG SS PI 7.5 STRL IVOR (GLOVE) ×2 IMPLANT
GLOVE SURG UNDER POLY LF SZ8 (GLOVE) ×2
GOWN STRL REUS W/TWL XL LVL3 (GOWN DISPOSABLE) ×4 IMPLANT
HEAD CERAMIC BIO DELTA 36 (Hips) ×2 IMPLANT
HOLDER FOLEY CATH W/STRAP (MISCELLANEOUS) ×2 IMPLANT
HOOD PEEL AWAY FLYTE STAYCOOL (MISCELLANEOUS) ×4 IMPLANT
IMMOBILIZER KNEE 20 (SOFTGOODS) ×2
IMMOBILIZER KNEE 20 THIGH 36 (SOFTGOODS) ×1 IMPLANT
KIT BASIN OR (CUSTOM PROCEDURE TRAY) ×2 IMPLANT
KIT TURNOVER KIT A (KITS) ×2 IMPLANT
LINER NEUTRAL 52X36X52 PLUS 4 (Liner) ×2 IMPLANT
MANIFOLD NEPTUNE II (INSTRUMENTS) ×2 IMPLANT
NEEDLE HYPO 22GX1.5 SAFETY (NEEDLE) ×4 IMPLANT
NS IRRIG 1000ML POUR BTL (IV SOLUTION) ×2 IMPLANT
PACK TOTAL JOINT (CUSTOM PROCEDURE TRAY) ×2 IMPLANT
PENCIL SMOKE EVACUATOR (MISCELLANEOUS) IMPLANT
PIN SECTOR W/GRIP ACE CUP 52MM (Hips) ×2 IMPLANT
PROTECTOR NERVE ULNAR (MISCELLANEOUS) ×2 IMPLANT
STAPLER VISISTAT 35W (STAPLE) IMPLANT
STEM FEM ACTIS HIGH SZ2 (Stem) ×2 IMPLANT
STRIP CLOSURE SKIN 1/2X4 (GAUZE/BANDAGES/DRESSINGS) ×2 IMPLANT
SUCTION FRAZIER HANDLE 12FR (TUBING) ×1
SUCTION TUBE FRAZIER 12FR DISP (TUBING) ×1 IMPLANT
SUT ETHIBOND NAB CT1 #1 30IN (SUTURE) ×8 IMPLANT
SUT MNCRL AB 3-0 PS2 18 (SUTURE) ×2 IMPLANT
SUT VIC AB 0 CT1 36 (SUTURE) ×4 IMPLANT
SUT VIC AB 2-0 CT1 27 (SUTURE) ×2
SUT VIC AB 2-0 CT1 TAPERPNT 27 (SUTURE) ×2 IMPLANT
SYR CONTROL 10ML LL (SYRINGE) ×4 IMPLANT
TOWEL OR 17X26 10 PK STRL BLUE (TOWEL DISPOSABLE) ×2 IMPLANT
TRAY FOLEY MTR SLVR 14FR STAT (SET/KITS/TRAYS/PACK) ×2 IMPLANT
WATER STERILE IRR 1000ML POUR (IV SOLUTION) ×4 IMPLANT

## 2020-05-16 NOTE — Anesthesia Procedure Notes (Signed)
Procedure Name: MAC Date/Time: 05/16/2020 7:42 AM Performed by: Lissa Morales, CRNA Pre-anesthesia Checklist: Patient identified, Emergency Drugs available, Suction available, Patient being monitored and Timeout performed Patient Re-evaluated:Patient Re-evaluated prior to induction Oxygen Delivery Method: Simple face mask Placement Confirmation: positive ETCO2

## 2020-05-16 NOTE — Anesthesia Postprocedure Evaluation (Signed)
Anesthesia Post Note  Patient: Bricia Taher  Procedure(s) Performed: TOTAL HIP ARTHROPLASTY (Right Hip)     Patient location during evaluation: PACU Anesthesia Type: Spinal Level of consciousness: oriented and awake and alert Pain management: pain level controlled Vital Signs Assessment: post-procedure vital signs reviewed and stable Respiratory status: spontaneous breathing, respiratory function stable and nonlabored ventilation Cardiovascular status: blood pressure returned to baseline and stable Postop Assessment: no headache, no backache, no apparent nausea or vomiting, spinal receding and patient able to bend at knees Anesthetic complications: no   No complications documented.  Last Vitals:  Vitals:   05/16/20 1048 05/16/20 1100  BP:  136/79  Pulse: 66 68  Resp: 18 17  Temp:    SpO2: 97% 100%    Last Pain:  Vitals:   05/16/20 1048  TempSrc:   PainSc: 9                  Rossi Burdo A.

## 2020-05-16 NOTE — Interval H&P Note (Signed)
History and Physical Interval Note:  05/16/2020 7:36 AM  Adrienne [name]  has presented today for surgery, with the diagnosis of OSTEOARTHRITIS RIGHT HIP.  The various methods of treatment have been discussed with the patient and family. After consideration of risks, benefits and other options for treatment, the patient has consented to  Procedure(s): TOTAL HIP ARTHROPLASTY (Right) as a surgical intervention.  The patient's history has been reviewed, patient examined, no change in status, stable for surgery.  I have reviewed the patient's chart and labs.  Questions were answered to the patient's satisfaction.     Yvette Rack

## 2020-05-16 NOTE — Transfer of Care (Signed)
Immediate Anesthesia Transfer of Care Note  Patient: Tu Shimmel  Procedure(s) Performed: TOTAL HIP ARTHROPLASTY (Right Hip)  Patient Location: PACU  Anesthesia Type:Spinal  Level of Consciousness: awake, alert , oriented and patient cooperative  Airway & Oxygen Therapy: Patient Spontanous Breathing and Patient connected to face mask oxygen  Post-op Assessment: Report given to RN and Post -op Vital signs reviewed and stable  Post vital signs: stable  Last Vitals:  Vitals Value Taken Time  BP 127/68 05/16/20 1022  Temp    Pulse 72 05/16/20 1027  Resp 15 05/16/20 1027  SpO2 99 % 05/16/20 1027  Vitals shown include unvalidated device data.  Last Pain:  Vitals:   05/16/20 0641  TempSrc: Oral  PainSc:          Complications: No complications documented.

## 2020-05-16 NOTE — Evaluation (Signed)
Physical Therapy Evaluation Patient Details Name: Adrienne [name] MRN: [id number] DOB: [birth year] Today's Date: 05/16/2020   History of Present Illness  Patient is 75 y.o. female s/p Rt THA posterior approach on 05/16/20 with PMH significant for HTN, HLD, GERD, OA.  Clinical Impression  Adrienne [name] is a 75 y.o.female POD 0 s/p Rt THA posterior approach. Patient reports independence with [facility] and rollator with mobility at baseline. Patient is now limited by functional impairments (see PT problem list below) and requires min gaurd for transfers and gait with RW. Patient was able to ambulate ~120 feet with RW and min guard/supervision and cues for safe walker management. Patient's sister present and provided safe guarding during gait with instruction from therapist. Patient instructed in exercises to facilitate ROM and circulation. Time spent educating on posterior hip precautions, safe sleeping position at home, and knee immobilizer to maintain precautiosn as well as importance of skin checks with wearing brace. Patient will benefit from continued skilled PT interventions to address impairments and progress towards PLOF. Patient has met mobility goals at adequate level for discharge home; will continue to follow if pt continues acute stay to progress towards Mod I goals.     Follow Up Recommendations Follow surgeon's recommendation for DC plan and follow-up therapies;Home health PT    Equipment Recommendations  3in1 (PT);Rolling walker with 5" wheels    Recommendations for Other Services       Precautions / Restrictions Precautions Precautions: Posterior Hip;Fall Precaution Booklet Issued: Yes (comment) Precaution Comments: reviewed precautions with pt and verbalized understanidng. Provided handout Required Braces or Orthoses: Knee Immobilizer - Right Knee Immobilizer - Right:  (reviewed with pt and education on proper skin checks; on to help maintain posterior hip  precautions) Restrictions Weight Bearing Restrictions: No Other Position/Activity Restrictions: WBAT      Mobility  Bed Mobility Overal bed mobility: Needs Assistance Bed Mobility: Supine to Sit;Rolling     Supine to sit: Min assist     General bed mobility comments: assist with trunk and for R LE progression to EOB. VC's for safety with maintaining posterior hip precautions.    Transfers Overall transfer level: Needs assistance Equipment used: Rolling walker (2 wheeled) Transfers: Sit to/from Stand Sit to Stand: Min guard;From elevated surface         General transfer comment: Min guard for elevated EOB, BSC, and standard height recliner. cues for hand placement and transition to RW and for safe trunk posture to maintain posterior hip precautions.  Ambulation/Gait Ambulation/Gait assistance: Min guard;Supervision Gait Distance (Feet): 120 Feet Assistive device: Rolling walker (2 wheeled) Gait Pattern/deviations: Step-to pattern;Decreased weight shift to right;Decreased stride length Gait velocity: decr   General Gait Details: pt ambulated safely with vebral cues for RW management. Pt's sister demonstrated proper guarding position with verbal cues from therapist. pt maintained posterior hip precautions with turning.  Stairs            Wheelchair Mobility    Modified Rankin (Stroke Patients Only)       Balance Overall balance assessment: Needs assistance Sitting-balance support: Feet supported Sitting balance-Leahy Scale: Fair     Standing balance support: During functional activity;Bilateral upper extremity supported Standing balance-Leahy Scale: Fair Standing balance comment: pt able to stand to wash hands with min guard/supervision and no external support. requires RW for gait.                             Pertinent Vitals/Pain Pain Assessment:  Faces Faces Pain Scale: Hurts a little bit Pain Location: Rt hip Pain Descriptors /  Indicators: Aching;Discomfort Pain Intervention(s): Limited activity within patient's tolerance;Repositioned;Monitored during session;Ice applied    Home Living Family/patient expects to be discharged to:: Private residence Living Arrangements: Alone Available Help at Discharge: Family Type of Home: House Home Access: [place]: [place]: Environmental consultant - 2 wheels;Cane - single point;Shower seat;Grab bars - tub/shower;Adaptive equipment Additional Comments: sister is staying at her hosuse. Her brother lives nearby. pt plans to sleep in lift chair.    Prior Function Level of Independence: Independent with assistive device(s)         Comments: walker     Hand Dominance   Dominant Hand: Right    Extremity/Trunk Assessment   Upper Extremity Assessment Upper Extremity Assessment: Overall WFL for tasks assessed    Lower Extremity Assessment Lower Extremity Assessment: RLE deficits/detail RLE Deficits / Details: pt with good ankle dorsi/plantar flexion, 4/5. limited testing due to posterior hip precautions RLE: Unable to fully assess due to immobilization RLE Sensation: WNL RLE Coordination: WNL    Cervical / Trunk Assessment Cervical / Trunk Assessment: Normal  Communication   Communication: No difficulties  Cognition Arousal/Alertness: Awake/alert Behavior During Therapy: WFL for tasks assessed/performed Overall Cognitive Status: Within Functional Limits for tasks assessed                                        General Comments      Exercises Total Joint Exercises Ankle Circles/Pumps: AROM;Both;10 reps;Seated Quad Sets: AROM;Both;5 reps;Seated Heel Slides: AAROM;Right;5 reps;Seated   Assessment/Plan    PT Assessment Patient needs continued PT services  PT Problem List Decreased strength;Decreased range of motion;Decreased activity tolerance;Decreased balance;Decreased mobility;Decreased knowledge of use of  DME;Decreased safety awareness;Decreased knowledge of precautions;Pain       PT Treatment Interventions DME instruction;Gait training;Stair training;Functional mobility training;Therapeutic activities;Therapeutic exercise;Balance training;Neuromuscular re-education;Patient/family education    PT Goals (Current goals can be found in the Care Plan section)  Acute Rehab PT Goals Patient Stated Goal: to regain independence PT Goal Formulation: With patient/family Time For Goal Achievement: 05/23/20 Potential to Achieve Goals: Good    Frequency 7X/week   Barriers to discharge        Co-evaluation               AM-PAC PT "6 Clicks" Mobility  Outcome Measure Help needed turning from your back to your side while in a flat bed without using bedrails?: A Little Help needed moving from lying on your back to sitting on the side of a flat bed without using bedrails?: A Little Help needed moving to and from a bed to a chair (including a wheelchair)?: A Little Help needed standing up from a chair using your arms (e.g., wheelchair or bedside chair)?: A Little Help needed to walk in hospital room?: A Little Help needed climbing 3-5 steps with a railing? : A Little 6 Click Score: 18    End of Session Equipment Utilized During Treatment: Gait belt Activity Tolerance: Patient tolerated treatment well Patient left: in chair;with call bell/phone within reach;with family/visitor present Nurse Communication: Mobility status PT Visit Diagnosis: Unsteadiness on feet (R26.81);Muscle weakness (generalized) (M62.81);Pain Pain - Right/Left: Right Pain - part of body: Hip    Time: 9470-9628 PT Time Calculation (min) (ACUTE ONLY): 48 min   Charges:   PT  Evaluation $PT Eval Low Complexity: 1 Low PT Treatments $Gait Training: 8-22 mins $Self Care/Home Management: 8-22        Verner Mould, [place] Office [phone] Pager [phone]    Jacques Navy 05/16/2020, 6:27 PM

## 2020-05-16 NOTE — Brief Op Note (Signed)
05/16/2020  10:21 AM  PATIENT:  Adrienne [name]  75 y.o. female  PRE-OPERATIVE DIAGNOSIS:  OSTEOARTHRITIS RIGHT HIP  POST-OPERATIVE DIAGNOSIS:  OSTEOARTHRITIS RIGHT HIP  PROCEDURE:  Procedure(s): TOTAL HIP ARTHROPLASTY (Right)  SURGEON:  Surgeon(s) and Role:    Earlie Server, MD - Primary  PHYSICIAN ASSISTANT: Chriss Czar, PA-C  ASSISTANTS: OR staff x1   ANESTHESIA:   local, spinal and IV sedation  EBL:  700 mL   BLOOD ADMINISTERED:none  DRAINS: none   LOCAL MEDICATIONS USED:  MARCAINE     SPECIMEN:  No Specimen  DISPOSITION OF SPECIMEN:  N/A  COUNTS:  YES  TOURNIQUET:  * No tourniquets in log *  DICTATION: .Other Dictation: Dictation Number unknown  PLAN OF CARE: Discharge to home after PACU  PATIENT DISPOSITION:  PACU - hemodynamically stable.   Delay start of Pharmacological VTE agent (>24hrs) due to surgical blood loss or risk of bleeding: yes

## 2020-05-16 NOTE — Op Note (Signed)
Adrienne [name], Adrienne [name] MEDICAL RECORD [id number] ACCOUNT [id number] DATE OF BIRTH:[birth year] FACILITY: [facility] LOCATION: [facility] PHYSICIAN:W. Tamie Minteer JR., MD  OPERATIVE REPORT  DATE OF PROCEDURE:  05/16/2020  PREOPERATIVE DIAGNOSIS:  Severe osteoarthritis of right hip with erosion of acetabulum and collapse of femoral head.  POSTOPERATIVE DIAGNOSIS:  Severe osteoarthritis of right hip with erosion of acetabulum and collapse of femoral head.  OPERATION:  Right total hip (ACTIS size 2 high offset +12 mm neck length with 36 mm ceramic hip ball, 52 mm Altrx Gription cup with +4 mm 10 degree liner.  SURGEON:  Vangie Bicker, MD  ASSISTANTMarjo Bicker.  ANESTHESIA:  Spinal.  ESTIMATED BLOOD LOSS:  700.  DESCRIPTION OF PROCEDURE:  Lateral position, posterior approach to the hip made, split the iliotibial band and gluteus maximus fascia, T-capsulotomy in the hip.  Femoral head was severely deformed with hyperemic very inflamed acetabulum as well.  We cut  the head about 1 fingerbreadth above the lesser troch and progressively rasped to accept the #2 ACTIS stem trial.  Acetabular retractors were placed anteriorly inferiorly with wing retractors superiorly posteriorly.  Again, severe erosion of the  acetabulum with anterior superior migration of the head relative to the acetabulum was noted.  Very inflamed hyperemic acetabulum was noted.  We medialized this and then progressively reamed up for 1 mm under reamed 51 to accept a 52 cup.  Final cup was  placed.  Excellent stability was noted.  Trial liner was placed.  We then trialed off the broach.  Again, the hip was extremely stable.  We eventually settled on the final after trialing off the final prosthesis on a +12 mm neck length.  There was no  tendency with this neck length for any dislocation to occur except that maximal flexion, maximal adduction, and internal rotation when the hip got 90+.  Final components were inserted relative  to the cup liner with a +4 mm 10 degree liner  for extra  stability on the cup.  I then placed the final stem, again trialed off the stem, stability was deemed to be excellent.  T-capsulotomy was closed with #1 Ethibond, the fascial layer with  #1 Ethibond, subcutaneous tissues with 2-0 Vicryl, and skin with  Monocryl.  Placed in a knee immobilizer.  Taken to recovery room in stable condition.  IN/NUANCE  D:05/16/2020 T:05/16/2020 JOB:[id number]

## 2020-05-16 NOTE — Anesthesia Procedure Notes (Signed)
Spinal ° °Patient location during procedure: OR °End time: 05/16/2020 7:50 AM °Staffing °Performed: resident/CRNA  °Resident/CRNA: Evans, Janet E, CRNA °Preanesthetic Checklist °Completed: patient identified, IV checked, site marked, risks and benefits discussed, surgical consent, monitors and equipment checked, pre-op evaluation and timeout performed °Spinal Block °Patient position: sitting °Prep: DuraPrep °Patient monitoring: heart rate, continuous pulse ox and blood pressure °Approach: midline °Location: L3-4 °Injection technique: single-shot °Needle °Needle type: Spinocan  °Needle gauge: 24 G °Needle length: 9 cm °Additional Notes °Expiration date of kit checked and confirmed. Patient tolerated procedure well, without complications. ° ° ° ° ° ° °

## 2020-05-16 NOTE — Discharge Instructions (Signed)

## 2020-05-17 DIAGNOSIS — I10 Essential (primary) hypertension: Secondary | ICD-10-CM | POA: Diagnosis not present

## 2020-05-17 DIAGNOSIS — Z471 Aftercare following joint replacement surgery: Secondary | ICD-10-CM | POA: Diagnosis not present

## 2020-05-17 DIAGNOSIS — I872 Venous insufficiency (chronic) (peripheral): Secondary | ICD-10-CM | POA: Diagnosis not present

## 2020-05-17 DIAGNOSIS — E559 Vitamin D deficiency, unspecified: Secondary | ICD-10-CM | POA: Diagnosis not present

## 2020-05-17 DIAGNOSIS — Z86010 Personal history of colonic polyps: Secondary | ICD-10-CM | POA: Diagnosis not present

## 2020-05-17 DIAGNOSIS — Z96641 Presence of right artificial hip joint: Secondary | ICD-10-CM | POA: Diagnosis not present

## 2020-05-17 DIAGNOSIS — K5790 Diverticulosis of intestine, part unspecified, without perforation or abscess without bleeding: Secondary | ICD-10-CM | POA: Diagnosis not present

## 2020-05-17 DIAGNOSIS — E663 Overweight: Secondary | ICD-10-CM | POA: Diagnosis not present

## 2020-05-17 DIAGNOSIS — E785 Hyperlipidemia, unspecified: Secondary | ICD-10-CM | POA: Diagnosis not present

## 2020-05-17 DIAGNOSIS — G8929 Other chronic pain: Secondary | ICD-10-CM | POA: Diagnosis not present

## 2020-05-17 DIAGNOSIS — K219 Gastro-esophageal reflux disease without esophagitis: Secondary | ICD-10-CM | POA: Diagnosis not present

## 2020-05-17 DIAGNOSIS — Z6827 Body mass index (BMI) 27.0-27.9, adult: Secondary | ICD-10-CM | POA: Diagnosis not present

## 2020-05-17 DIAGNOSIS — R7303 Prediabetes: Secondary | ICD-10-CM | POA: Diagnosis not present

## 2020-05-19 ENCOUNTER — Encounter (HOSPITAL_COMMUNITY): Payer: Self-pay | Admitting: Orthopedic Surgery

## 2020-05-20 DIAGNOSIS — I10 Essential (primary) hypertension: Secondary | ICD-10-CM | POA: Diagnosis not present

## 2020-05-20 DIAGNOSIS — Z471 Aftercare following joint replacement surgery: Secondary | ICD-10-CM | POA: Diagnosis not present

## 2020-05-20 DIAGNOSIS — E785 Hyperlipidemia, unspecified: Secondary | ICD-10-CM | POA: Diagnosis not present

## 2020-05-20 DIAGNOSIS — I872 Venous insufficiency (chronic) (peripheral): Secondary | ICD-10-CM | POA: Diagnosis not present

## 2020-05-20 DIAGNOSIS — E559 Vitamin D deficiency, unspecified: Secondary | ICD-10-CM | POA: Diagnosis not present

## 2020-05-20 DIAGNOSIS — G8929 Other chronic pain: Secondary | ICD-10-CM | POA: Diagnosis not present

## 2020-05-23 DIAGNOSIS — E785 Hyperlipidemia, unspecified: Secondary | ICD-10-CM | POA: Diagnosis not present

## 2020-05-23 DIAGNOSIS — Z471 Aftercare following joint replacement surgery: Secondary | ICD-10-CM | POA: Diagnosis not present

## 2020-05-23 DIAGNOSIS — I872 Venous insufficiency (chronic) (peripheral): Secondary | ICD-10-CM | POA: Diagnosis not present

## 2020-05-23 DIAGNOSIS — G8929 Other chronic pain: Secondary | ICD-10-CM | POA: Diagnosis not present

## 2020-05-23 DIAGNOSIS — I10 Essential (primary) hypertension: Secondary | ICD-10-CM | POA: Diagnosis not present

## 2020-05-23 DIAGNOSIS — E559 Vitamin D deficiency, unspecified: Secondary | ICD-10-CM | POA: Diagnosis not present

## 2020-05-26 DIAGNOSIS — E785 Hyperlipidemia, unspecified: Secondary | ICD-10-CM | POA: Diagnosis not present

## 2020-05-26 DIAGNOSIS — Z471 Aftercare following joint replacement surgery: Secondary | ICD-10-CM | POA: Diagnosis not present

## 2020-05-26 DIAGNOSIS — E559 Vitamin D deficiency, unspecified: Secondary | ICD-10-CM | POA: Diagnosis not present

## 2020-05-26 DIAGNOSIS — G8929 Other chronic pain: Secondary | ICD-10-CM | POA: Diagnosis not present

## 2020-05-26 DIAGNOSIS — I872 Venous insufficiency (chronic) (peripheral): Secondary | ICD-10-CM | POA: Diagnosis not present

## 2020-05-26 DIAGNOSIS — I10 Essential (primary) hypertension: Secondary | ICD-10-CM | POA: Diagnosis not present

## 2020-05-28 DIAGNOSIS — E559 Vitamin D deficiency, unspecified: Secondary | ICD-10-CM | POA: Diagnosis not present

## 2020-05-28 DIAGNOSIS — I872 Venous insufficiency (chronic) (peripheral): Secondary | ICD-10-CM | POA: Diagnosis not present

## 2020-05-28 DIAGNOSIS — Z471 Aftercare following joint replacement surgery: Secondary | ICD-10-CM | POA: Diagnosis not present

## 2020-05-28 DIAGNOSIS — G8929 Other chronic pain: Secondary | ICD-10-CM | POA: Diagnosis not present

## 2020-05-28 DIAGNOSIS — I10 Essential (primary) hypertension: Secondary | ICD-10-CM | POA: Diagnosis not present

## 2020-05-28 DIAGNOSIS — E785 Hyperlipidemia, unspecified: Secondary | ICD-10-CM | POA: Diagnosis not present

## 2020-05-29 DIAGNOSIS — M1611 Unilateral primary osteoarthritis, right hip: Secondary | ICD-10-CM | POA: Diagnosis not present

## 2020-06-02 ENCOUNTER — Ambulatory Visit (HOSPITAL_COMMUNITY): Payer: Medicare Other | Attending: Orthopedic Surgery | Admitting: Physical Therapy

## 2020-06-02 ENCOUNTER — Encounter (HOSPITAL_COMMUNITY): Payer: Self-pay | Admitting: Physical Therapy

## 2020-06-02 ENCOUNTER — Other Ambulatory Visit: Payer: Self-pay

## 2020-06-02 DIAGNOSIS — M6281 Muscle weakness (generalized): Secondary | ICD-10-CM | POA: Diagnosis not present

## 2020-06-02 DIAGNOSIS — M25561 Pain in right knee: Secondary | ICD-10-CM | POA: Insufficient documentation

## 2020-06-02 DIAGNOSIS — M25551 Pain in right hip: Secondary | ICD-10-CM | POA: Insufficient documentation

## 2020-06-02 DIAGNOSIS — R262 Difficulty in walking, not elsewhere classified: Secondary | ICD-10-CM | POA: Insufficient documentation

## 2020-06-02 DIAGNOSIS — G8929 Other chronic pain: Secondary | ICD-10-CM | POA: Diagnosis not present

## 2020-06-02 NOTE — Therapy (Addendum)
[place], [place], [address] Phone: [phone]   Fax:  [phone]  Physical Therapy Evaluation  [name] MRN: [id number] Date of Birth: May 20, 1945 Referring Provider (PT): Earlie Server, MD   Encounter Date: 06/02/2020   PT End of Session - 06/02/20 1411    Visit Number 1    Number of Visits 18    Date for PT Re-Evaluation 07/14/20    Authorization Type medicare, secondary BCBS no auth    Progress Note Due on Visit 10    PT Start Time 1410   pt late to session by 10 minutes   PT Stop Time 1445    PT Time Calculation (min) 35 min    Activity Tolerance Patient limited by fatigue    Behavior During Therapy [facility] for tasks assessed/performed           Past Medical History:  Diagnosis Date  . Allergy   . Arthritis   . Diverticulosis 05/2009  . GERD (gastroesophageal reflux disease)   . Hyperlipidemia   . Hypertension     Past Surgical History:  Procedure Laterality Date  . ABDOMINAL HYSTERECTOMY  1990   for fibroids ovaries remain   . CHOLECYSTECTOMY  1980  . COLONOSCOPY  05/2009   rare diverticula, small internal hemorrhoids. Next colonoscopy in 5-10 yrs.  . COLONOSCOPY N/A 10/23/2018   Procedure: COLONOSCOPY;  Surgeon: Danie Binder, MD;  Location: [facility];  Service: Endoscopy;  Laterality: N/A;  2:00pm  . left nipple inversion bx benign  2000  . POLYPECTOMY  10/23/2018   Procedure: POLYPECTOMY;  Surgeon: Danie Binder, MD;  Location: [facility];  Service: Endoscopy;;  . TOTAL HIP ARTHROPLASTY Right 05/16/2020   Procedure: TOTAL HIP ARTHROPLASTY;  Surgeon: Earlie Server, MD;  Location: [facility];  Service: Orthopedics;  Laterality: Right;    There were no vitals filed for this visit.    Subjective Assessment - 06/02/20 1420    Subjective Patient with long history of hip pain with trial of PT previously at this clinic prior to her surgery. States pain got to  be too  much and she eventually elected to have a hip replacement on 05/16/20 and had home health up until last wedensday. Current pain level at rest is 0/10. States that she feels likes she can feel the joint and she tends to shift away from it. States she has been taking her medication regularly. States she also tries to walk as much as she can. At it's worse the pain is 3/10 described as throbbing. States she has been having a lot of swelling and was checked for a clot which came back as negative, states she has been dealing with the swelling pre-operatively.    Pertinent History arthritis    How long can you stand comfortably? 2-3 minutes    How long can you walk comfortably? 2-3 minutes    Patient Stated Goals to be able to walk without a walker and cane    Currently in Pain? Yes    Pain Score 3     Pain Location Hip    Pain Orientation Right    Pain Onset More than a month ago    Pain Frequency Intermittent    Aggravating Factors  bending and moving    Pain Relieving Factors rest and pain meds              [facility] PT Assessment - 06/02/20  0001      Assessment   Medical Diagnosis R THA    Referring Provider (PT) Frederico Hamman, MD    Onset Date/Surgical Date 05/17/20    Prior Therapy yes for right hip      Precautions   Precautions Posterior Hip      Restrictions   Weight Bearing Restrictions Yes      Balance Screen   Has the patient fallen in the past 6 months No      Home Environment   Living Environment Private residence    Living Arrangements Alone    Available Help at Discharge Family    Type of Home House    Home Access Level entry;Ramped entrance    Home Layout One level    Home Equipment Walker - 2 wheels;Cane - single point      Prior Function   Level of Independence Independent      Cognition   Overall Cognitive Status Within Functional Limits for tasks assessed      Observation/Other Assessments   Observations swelling throughout right leg with 4+  pitting edema in lower leg on right    Skin Integrity will assess in future sessions    Focus on Therapeutic Outcomes (FOTO)  26% function      Observation/Other Assessments-Edema    Edema Circumferential      Circumferential Edema   Circumferential - Right 16.25 at calf and 16.5 at knee    Circumferential - Left  14.25 at calf and 15.0 at knee      ROM / Strength   AROM / PROM / Strength AROM      AROM   AROM Assessment Site Hip    Right/Left Hip Right;Left      Special Tests   Other special tests negative homan's sign noted      Transfers   Transfers Sit to Stand;Stand to Sit    Sit to Stand 6: Modified independent (Device/Increase time);With upper extremity assist    Stand to Sit 6: Modified independent (Device/Increase time);With upper extremity assist      Ambulation/Gait   Ambulation/Gait Yes    Ambulation/Gait Assistance 6: Modified independent (Device/Increase time)    Ambulation Distance (Feet) 200 Feet    Assistive device Rolling walker    Gait Pattern Decreased hip/knee flexion - right;Decreased stride length;Decreased weight shift to right;Trunk flexed    Ambulation Surface Level;Indoor    Gait velocity decreased    Gait Comments                      Objective measurements completed on examination: See above findings.               PT Education - 06/02/20 1457    Education Details on swelling, different grades of compression, use of butler for improved ease of donning garments.    Person(s) Educated Patient    Methods Explanation;Demonstration    Comprehension Verbalized understanding            PT Short Term Goals - 06/02/20 1453      PT SHORT TERM GOAL #1   Title Patient will report at least 50% improvement in overall symptoms and/or function to demonstrate improved functional mobility    Time 3    Period Weeks    Status New    Target Date 06/23/20      PT SHORT TERM GOAL #2   Title Patient will be able to ambulate  at least 226  feet in 2 minutes with least restrictive assistive device to demonstrate improved ambulatory mobilty.    Time 3    Period Weeks    Status New    Target Date 06/23/20      PT SHORT TERM GOAL #3   Title Patient will be independent in self management strategies to improve quality of life and functional outcomes.    Time 3    Period Weeks    Status New    Target Date 06/23/20             PT Long Term Goals - 06/02/20 1454      PT LONG TERM GOAL #1   Title Patient will be able to ambulate at least 226 feet in 2 minutes without assistive device to demonstrate improved ambulatory mobilty.    Time 6    Period Weeks    Status New    Target Date 07/14/20      PT LONG TERM GOAL #2   Title Patient will report at least 75% improvement in overall symptoms and/or function to demonstrate improved functional mobility    Time 6    Period Weeks    Status New    Target Date 07/14/20      PT LONG TERM GOAL #3   Title Patient will demonstrate reduced swelling in right lower extremity with measurments on left and right leg measuring withing 1/2 inche of eachother.    Time 6    Period Weeks    Status New    Target Date 07/14/20      PT LONG TERM GOAL #4   Title Patient will improve on FOTO score to meet predicted outcomes to demonstrate improved functional mobility.    Time 6    Period Weeks    Status New    Target Date 07/14/20                  Plan - 06/02/20 1428    Clinical Impression Statement Patient is known to this clinic for previous treatment of right hip pain, she presents today after undergoing right THA on 05/16/20 with a posterior approach. Patient able to ambulate pretty well with walker today but fatigued quickly. To note patient had pitting edema in right lower extremity and was educated on benefits of higher grade compression garment that extends above the knee. Patient would greatly benefit from skilled physical therapy to improve overall function and  aid in her post-operative rehabilitation.    Personal Factors and Comorbidities Comorbidity 1    Comorbidities osteoporosis    Examination-Activity Limitations Bed Mobility;Dressing;Locomotion Level;Reach Overhead;Lift;Hygiene/Grooming;Transfers;Stand;Stairs;Squat;Bend    Examination-Participation Restrictions Community Activity;Driving;Meal Prep;Shop    Stability/Clinical Decision Making Stable/Uncomplicated    Clinical Decision Making Low    Rehab Potential Good    PT Frequency 3x / week    PT Duration 6 weeks    PT Treatment/Interventions ADLs/Self Care Home Management;Aquatic Therapy;Cryotherapy;Electrical Stimulation;Moist Heat;Traction;Manual techniques;Therapeutic exercise;Therapeutic activities;Functional mobility training;Stair training;Gait training;DME Instruction;Neuromuscular re-education;Patient/family education;Dry needling;Passive range of motion;Joint Manipulations    PT Next Visit Plan edema management f/u with compressoin garments, isometrics, hip ROM within precautions, functional strength, walking    PT Home Exercise Plan Walking with walker, currently performing HHPT HEP    Consulted and Agree with Plan of Care Patient           Patient will benefit from skilled therapeutic intervention in order to improve the following deficits and impairments:  Abnormal gait,Decreased endurance,Decreased skin integrity,Increased edema,Decreased scar mobility,Decreased activity tolerance,Decreased  balance,Decreased range of motion,Decreased mobility,Difficulty walking,Pain,Decreased strength  Visit Diagnosis: Pain in right hip  Difficulty in walking, not elsewhere classified  Muscle weakness (generalized)     Problem List Patient Active Problem List   Diagnosis Date Noted  . Leg swelling 03/31/2020  . Venous insufficiency of both lower extremities 03/31/2020  . Preoperative general physical examination 03/06/2020  . Vitamin D deficiency 03/06/2020  . Prediabetes 03/06/2020   . Knee pain, right 04/06/2019  . Left ankle sprain 04/06/2019  . Personal history of colonic polyps 07/12/2018  . Ingrown right big toenail 03/23/2017  . Hip pain, chronic, right 02/03/2016  . GERD (gastroesophageal reflux disease) 12/26/2014  . Diverticulosis of colon without hemorrhage 07/05/2014  . FH: pancreatic cancer 01/26/2014  . FH: glaucoma 03/24/2013  . Overweight (BMI 25.0-29.9) 03/13/2012  . Hyperlipidemia LDL goal <100 04/03/2009  . Essential hypertension 04/03/2009   3:02 PM, 06/02/20 Jerene Pitch, DPT Physical Therapy with [facility]  [phone] office  [place] [address] [place], [place], [address] Phone: [phone]   Fax:  [phone]  Name: Adrienne [name] MRN: [id number] Date of Birth: June 07, 1945

## 2020-06-03 ENCOUNTER — Telehealth (INDEPENDENT_AMBULATORY_CARE_PROVIDER_SITE_OTHER): Payer: Medicare Other | Admitting: Family Medicine

## 2020-06-03 DIAGNOSIS — E559 Vitamin D deficiency, unspecified: Secondary | ICD-10-CM

## 2020-06-03 DIAGNOSIS — E663 Overweight: Secondary | ICD-10-CM | POA: Diagnosis not present

## 2020-06-03 DIAGNOSIS — I10 Essential (primary) hypertension: Secondary | ICD-10-CM | POA: Diagnosis not present

## 2020-06-03 DIAGNOSIS — E785 Hyperlipidemia, unspecified: Secondary | ICD-10-CM | POA: Diagnosis not present

## 2020-06-03 DIAGNOSIS — Z1231 Encounter for screening mammogram for malignant neoplasm of breast: Secondary | ICD-10-CM | POA: Diagnosis not present

## 2020-06-03 MED ORDER — GABAPENTIN 100 MG PO CAPS: ORAL_CAPSULE | ORAL | 1 refills | Status: DC

## 2020-06-03 NOTE — Patient Instructions (Signed)
F.//U in 8 months, call if you need me sooner  Thankful you are recovering well from surgery  Mammogram to be scheduled at checkout for April please  Fasting CBC, lipid, cmp and EGFR and Vit D 3 to 5 days before follow up  Check pharmacy for your TdAP

## 2020-06-03 NOTE — Progress Notes (Signed)
Virtual Visit via Telephone Note  I connected with Adrienne [name] on 06/03/20 at 11:00 AM EST by telephone and verified that I am speaking with the correct person using two identifiers.  Location: Patient: home Provider: office   I discussed the limitations, risks, security and privacy concerns of performing an evaluation and management service by telephone and the availability of in person appointments. I also discussed with the patient that there may be a patient responsible charge related to this service. The patient expressed understanding and agreed to proceed.   History of Present Illness: F/U chronic problems and address any new or current concerns. Review and update medications and allergies. Review recent lab and radiologic data . Update routine health maintainace. Review an encourage improved health habits to include nutrition, exercise and  sleep .    3 weeks out from total right hip arthroplasty, recovering well at home with intensive out patient PT. Pain management per ortho is adequate with minimal use of narcotic medication Denies recent fever or chills. Denies sinus pressure, nasal congestion, ear pain or sore throat. Denies chest congestion, productive cough or wheezing. Denies chest pains, palpitations and leg swelling Denies abdominal pain, nausea, vomiting,diarrhea or constipation.   Denies dysuria, frequency, hesitancy or incontinence. Improved joint pain and mobility, immediate post op period with aggressive PT Denies headaches, seizures, numbness, or tingling. Denies depression, anxiety or insomnia. Denies skin break down or rash.     Observations/Objective: There were no vitals taken for this visit. Good communication with no confusion and intact memory. Alert and oriented x 3 No signs of respiratory distress during speech    Assessment and Plan: Essential hypertension Elevated at last visit , prior to this has been controlled, needs sooner appt to  check bP, will reach out and ask her to do home checks DASH diet and commitment to daily physical activity for a minimum of 30 minutes discussed and encouraged, as a part of hypertension management. The importance of attaining a healthy weight is also discussed.  BP/Weight 05/16/2020 05/07/2020 03/31/2020 03/04/2020 08/20/2019 04/03/2019 1/61/0960  Systolic BP 454 098 119 147 829 562 130  Diastolic BP 83 79 88 74 78 78 68  Wt. (Lbs) 166 166 166.12 166.4 164 164.04 164  BMI 27.2 27.2 27.22 27.27 26.88 26.88 27.29       Hyperlipidemia LDL goal <100 Hyperlipidemia:Low fat diet discussed and encouraged.   Lipid Panel  Lab Results  Component Value Date   CHOL 249 (H) 03/05/2020   HDL 111 03/05/2020   LDLCALC 125 (H) 03/05/2020   TRIG 80 03/05/2020   CHOLHDL 2.2 03/05/2020    Dietary management only, low risk ratio for CAD   Overweight (BMI 25.0-29.9)  Patient re-educated about  the importance of commitment to a  minimum of 150 minutes of exercise per week as able.  The importance of healthy food choices with portion control discussed, as well as eating regularly and within a 12 hour window most days. The need to choose "clean , green" food 50 to 75% of the time is discussed, as well as to make water the primary drink and set a goal of 64 ounces water daily.    Weight /BMI 05/16/2020 05/07/2020 03/31/2020  WEIGHT 166 lb 166 lb 166 lb 1.9 oz  HEIGHT - 5' 5.5" 5' 5.5"  BMI 27.2 kg/m2 27.2 kg/m2 27.22 kg/m2        Follow Up Instructions:    I discussed the assessment and treatment plan with the patient. The  patient was provided an opportunity to ask questions and all were answered. The patient agreed with the plan and demonstrated an understanding of the instructions.   The patient was advised to call back or seek an in-person evaluation if the symptoms worsen or if the condition fails to improve as anticipated.  I provided 12 minutes of non-face-to-face time during this  encounter.   Tula Nakayama, MD

## 2020-06-04 ENCOUNTER — Encounter (HOSPITAL_COMMUNITY): Payer: Medicare Other

## 2020-06-04 ENCOUNTER — Ambulatory Visit: Payer: Medicare Other | Admitting: Family Medicine

## 2020-06-05 ENCOUNTER — Ambulatory Visit (HOSPITAL_COMMUNITY): Payer: Medicare Other

## 2020-06-05 ENCOUNTER — Other Ambulatory Visit: Payer: Self-pay

## 2020-06-05 ENCOUNTER — Encounter (HOSPITAL_COMMUNITY): Payer: Self-pay

## 2020-06-05 DIAGNOSIS — G8929 Other chronic pain: Secondary | ICD-10-CM | POA: Diagnosis not present

## 2020-06-05 DIAGNOSIS — R262 Difficulty in walking, not elsewhere classified: Secondary | ICD-10-CM

## 2020-06-05 DIAGNOSIS — M25561 Pain in right knee: Secondary | ICD-10-CM | POA: Diagnosis not present

## 2020-06-05 DIAGNOSIS — M25551 Pain in right hip: Secondary | ICD-10-CM | POA: Diagnosis not present

## 2020-06-05 DIAGNOSIS — M6281 Muscle weakness (generalized): Secondary | ICD-10-CM | POA: Diagnosis not present

## 2020-06-05 NOTE — Therapy (Signed)
[place], [place], [address] Phone: [phone]   Fax:  [phone]  Physical Therapy Treatment  [name] MRN: [id number] Date of Birth: 1945-08-31 Referring Provider (PT): Earlie Server, MD   Encounter Date: 06/05/2020   PT End of Session - 06/05/20 1412    Visit Number 2    Number of Visits 18    Date for PT Re-Evaluation 07/14/20    Authorization Type medicare, secondary BCBS no auth    Progress Note Due on Visit 10    PT Start Time 1407    PT Stop Time 1446    PT Time Calculation (min) 39 min    Equipment Utilized During Treatment Gait belt    Activity Tolerance Patient tolerated treatment well;Patient limited by fatigue    Behavior During Therapy [facility] for tasks assessed/performed           Past Medical History:  Diagnosis Date  . Allergy   . Arthritis   . Diverticulosis 05/2009  . GERD (gastroesophageal reflux disease)   . Hyperlipidemia   . Hypertension     Past Surgical History:  Procedure Laterality Date  . ABDOMINAL HYSTERECTOMY  1990   for fibroids ovaries remain   . CHOLECYSTECTOMY  1980  . COLONOSCOPY  05/2009   rare diverticula, small internal hemorrhoids. Next colonoscopy in 5-10 yrs.  . COLONOSCOPY N/A 10/23/2018   Procedure: COLONOSCOPY;  Surgeon: Danie Binder, MD;  [facility];  Service: Endoscopy;  Laterality: N/A;  2:00pm  . JOINT REPLACEMENT N/A    Phreesia 06/02/2020  . left nipple inversion bx benign  2000  . POLYPECTOMY  10/23/2018   Procedure: POLYPECTOMY;  Surgeon: Danie Binder, MD;  [facility];  Service: Endoscopy;;  . TOTAL HIP ARTHROPLASTY Right 05/16/2020   Procedure: TOTAL HIP ARTHROPLASTY;  Surgeon: Earlie Server, MD;  [facility];  Service: Orthopedics;  Laterality: Right;    There were no vitals filed for this visit.   Subjective Assessment - 06/05/20 1411    Subjective Pt stated she is feeling  pretty good, no reports of pain upon initial standing.  Reports compliance with HEP daily.    Pertinent History arthritis    Patient Stated Goals to be able to walk without a walker and cane    Currently in Pain? No/denies              [facility] PT Assessment - 06/05/20 0001      Assessment   Medical Diagnosis R THA    Referring Provider (PT) Earlie Server, MD    Onset Date/Surgical Date 05/17/20    Prior Therapy yes for right hip      Precautions   Precautions Posterior Hip                         [facility] Adult PT Treatment/Exercise - 06/05/20 0001      Exercises   Exercises Knee/Hip      Knee/Hip Exercises: Standing   Rocker Board Limitations 20 reps lateral      Knee/Hip Exercises: Supine   Bridges 2 sets;5 reps    Other Supine Knee/Hip Exercises HHPT HEP: quad sets, ankle pumps, heel slides, clam/butterfly stretch      Knee/Hip Exercises: Sidelying   Clams 5x wiht AAROM                  PT Education - 06/05/20  [id number]    Education Details Reviewed goals, educated importance of HEP compliance, pt able to recall and demonstrate current HHPT HEP exercises.  Reviewed posterior hip precautions.  Educated benefits with compression hose to assist with edema present. Measurements taken and paperwork given to pt.    Person(s) Educated Patient    Methods Explanation;Demonstration;Handout    Comprehension Verbalized understanding;Returned demonstration            PT Short Term Goals - 06/02/20 1453      PT SHORT TERM GOAL #1   Title Patient will report at least 50% improvement in overall symptoms and/or function to demonstrate improved functional mobility    Time 3    Period Weeks    Status New    Target Date 06/23/20      PT SHORT TERM GOAL #2   Title Patient will be able to ambulate at least 226 feet in 2 minutes with least restrictive assistive device to demonstrate improved ambulatory mobilty.    Time 3    Period Weeks    Status New    Target  Date 06/23/20      PT SHORT TERM GOAL #3   Title Patient will be independent in self management strategies to improve quality of life and functional outcomes.    Time 3    Period Weeks    Status New    Target Date 06/23/20             PT Long Term Goals - 06/02/20 1454      PT LONG TERM GOAL #1   Title Patient will be able to ambulate at least 226 feet in 2 minutes without assistive device to demonstrate improved ambulatory mobilty.    Time 6    Period Weeks    Status New    Target Date 07/14/20      PT LONG TERM GOAL #2   Title Patient will report at least 75% improvement in overall symptoms and/or function to demonstrate improved functional mobility    Time 6    Period Weeks    Status New    Target Date 07/14/20      PT LONG TERM GOAL #3   Title Patient will demonstrate reduced swelling in right lower extremity with measurments on left and right leg measuring withing 1/2 inche of eachother.    Time 6    Period Weeks    Status New    Target Date 07/14/20      PT LONG TERM GOAL #4   Title Patient will improve on FOTO score to meet predicted outcomes to demonstrate improved functional mobility.    Time 6    Period Weeks    Status New    Target Date 07/14/20                 Plan - 06/05/20 1738    Clinical Impression Statement Reviewed goals, educated importance of HEP compliance, pt able to recall and demonstrate some of HHPT HEP.  Added bridges for gluteal strengthening to HEP.  Pt wearing compression stockings into dept that appear to be thin.  Educated on assistance of compression garments with increased pressure as pt reports edema present pre-surgery.  Measurements taken and paperwork given to pt to order thigh high.  Added rockerboard to equalize weight bearing during gait, UE support required with new activity.  No reports of increased pain, was fatigued at EOS.    Personal Factors and Comorbidities Comorbidity 1    Comorbidities  osteoporosis     Examination-Activity Limitations Bed Mobility;Dressing;Locomotion Level;Reach Overhead;Lift;Hygiene/Grooming;Transfers;Stand;Stairs;Squat;Bend    Examination-Participation Restrictions Community Activity;Driving;Meal Prep;Shop    Stability/Clinical Decision Making Stable/Uncomplicated    Clinical Decision Making Low    Rehab Potential Good    PT Frequency 3x / week    PT Duration 6 weeks    PT Treatment/Interventions ADLs/Self Care Home Management;Aquatic Therapy;Cryotherapy;Electrical Stimulation;Moist Heat;Traction;Manual techniques;Therapeutic exercise;Therapeutic activities;Functional mobility training;Stair training;Gait training;DME Instruction;Neuromuscular re-education;Patient/family education;Dry needling;Passive range of motion;Joint Manipulations    PT Next Visit Plan edema management f/u with compressoin garments, isometrics, hip ROM within precautions, functional strength, walking    PT Home Exercise Plan walking with walker - currently performing HHPT HEP           Patient will benefit from skilled therapeutic intervention in order to improve the following deficits and impairments:  Abnormal gait,Decreased endurance,Decreased skin integrity,Increased edema,Decreased scar mobility,Decreased activity tolerance,Decreased balance,Decreased range of motion,Decreased mobility,Difficulty walking,Pain,Decreased strength  Visit Diagnosis: Pain in right hip  Difficulty in walking, not elsewhere classified  Muscle weakness (generalized)     Problem List Patient Active Problem List   Diagnosis Date Noted  . Leg swelling 03/31/2020  . Venous insufficiency of both lower extremities 03/31/2020  . Preoperative general physical examination 03/06/2020  . Vitamin D deficiency 03/06/2020  . Prediabetes 03/06/2020  . Knee pain, right 04/06/2019  . Left ankle sprain 04/06/2019  . Personal history of colonic polyps 07/12/2018  . Ingrown right big toenail 03/23/2017  . Hip pain, chronic,  right 02/03/2016  . GERD (gastroesophageal reflux disease) 12/26/2014  . Diverticulosis of colon without hemorrhage 07/05/2014  . FH: pancreatic cancer 01/26/2014  . FH: glaucoma 03/24/2013  . Overweight (BMI 25.0-29.9) 03/13/2012  . Hyperlipidemia LDL goal <100 04/03/2009  . Essential hypertension 04/03/2009   Ihor Austin, LPTA/CLT; CBIS [phone]  Aldona Lento 06/05/2020, 5:45 PM  [place] [address] [place], [place], [address] Phone: [phone]   Fax:  [phone]  Name: Baleigh Rennaker MRN: [id number] Date of Birth: 1945-06-14

## 2020-06-05 NOTE — Patient Instructions (Signed)
Bridge    Lie back, legs bent. Inhale, pressing hips up. Keeping ribs in, lengthen lower back. Exhale, rolling down along spine from top. Repeat 10 times. Do 2 sessions per day.  http://pm.exer.us/55   Copyright  VHI. All rights reserved.   

## 2020-06-06 ENCOUNTER — Ambulatory Visit (HOSPITAL_COMMUNITY): Payer: Medicare Other | Admitting: Physical Therapy

## 2020-06-06 ENCOUNTER — Encounter (HOSPITAL_COMMUNITY): Payer: Self-pay | Admitting: Physical Therapy

## 2020-06-06 DIAGNOSIS — M25551 Pain in right hip: Secondary | ICD-10-CM

## 2020-06-06 DIAGNOSIS — R262 Difficulty in walking, not elsewhere classified: Secondary | ICD-10-CM | POA: Diagnosis not present

## 2020-06-06 DIAGNOSIS — M6281 Muscle weakness (generalized): Secondary | ICD-10-CM

## 2020-06-06 DIAGNOSIS — G8929 Other chronic pain: Secondary | ICD-10-CM | POA: Diagnosis not present

## 2020-06-06 DIAGNOSIS — M25561 Pain in right knee: Secondary | ICD-10-CM | POA: Diagnosis not present

## 2020-06-06 NOTE — Therapy (Signed)
[place] [address] [place], [place], [address] Phone: [phone]   Fax:  [phone]  Physical Therapy Treatment  [name] MRN: [id number] Date of Birth: 03/04/1946 Referring Provider (PT): Earlie Server, MD   Encounter Date: 06/06/2020   PT End of Session - 06/06/20 1408    Visit Number 3    Number of Visits 18    Date for PT Re-Evaluation 07/14/20    Authorization Type medicare, secondary BCBS no auth    Progress Note Due on Visit 10    PT Start Time 1408   pt 7 minutes late to session   PT Stop Time 1440    PT Time Calculation (min) 32 min    Equipment Utilized During Treatment Gait belt    Activity Tolerance Patient tolerated treatment well;Patient limited by fatigue    Behavior During Therapy [facility] for tasks assessed/performed           Past Medical History:  Diagnosis Date  . Allergy   . Arthritis   . Diverticulosis 05/2009  . GERD (gastroesophageal reflux disease)   . Hyperlipidemia   . Hypertension     Past Surgical History:  Procedure Laterality Date  . ABDOMINAL HYSTERECTOMY  1990   for fibroids ovaries remain   . CHOLECYSTECTOMY  1980  . COLONOSCOPY  05/2009   rare diverticula, small internal hemorrhoids. Next colonoscopy in 5-10 yrs.  . COLONOSCOPY N/A 10/23/2018   Procedure: COLONOSCOPY;  Surgeon: Danie Binder, MD;  [facility];  Service: Endoscopy;  Laterality: N/A;  2:00pm  . JOINT REPLACEMENT N/A    Phreesia 06/02/2020  . left nipple inversion bx benign  2000  . POLYPECTOMY  10/23/2018   Procedure: POLYPECTOMY;  Surgeon: Danie Binder, MD;  [facility];  Service: Endoscopy;;  . TOTAL HIP ARTHROPLASTY Right 05/16/2020   Procedure: TOTAL HIP ARTHROPLASTY;  Surgeon: Earlie Server, MD;  [facility];  Service: Orthopedics;  Laterality: Right;    There were no vitals filed for this visit.   Subjective Assessment - 06/06/20 1411     Subjective States she has been doing her exercises though they hurt.    Pertinent History arthritis    Patient Stated Goals to be able to walk without a walker and cane    Currently in Pain? Yes    Pain Score 2     Pain Location Hip    Pain Orientation Right    Pain Descriptors / Indicators [facility] PT Assessment - 06/06/20 0001      Assessment   Medical Diagnosis R THA    Referring Provider (PT) Earlie Server, MD    Onset Date/Surgical Date 05/17/20    Prior Therapy yes for right hip      Precautions   Precautions Posterior Hip      Observation/Other Assessments   Observations swelling throughout right lower leg- compression garments currently donned, incision almost healed but two spots along inferior part of incision still scabbed - steri strips in place over these spots                         [facility] Adult PT Treatment/Exercise - 06/06/20 0001      Ambulation/Gait   Ambulation/Gait Yes    Ambulation/Gait Assistance 6: Modified independent (Device/Increase time)    Ambulation Distance (Feet) 452 Feet  Assistive device Rolling walker    Gait Pattern Decreased hip/knee flexion - right;Decreased stride length;Decreased weight shift to right;Trunk flexed    Ambulation Surface Level;Indoor      Knee/Hip Exercises: Seated   Abduction/Adduction  AROM;Strengthening;3 sets;10 reps;Both   abd only with belt isometricx     Knee/Hip Exercises: Prone   Hamstring Curl 2 sets;5 reps;2 seconds   Right side heavy feeling   Other Prone Exercises prone lying over 2 pillows - 6 minutes                  PT Education - 06/06/20 1438    Education Details on continued use of compression stockings, elevating leg and how to    [employer]) Educated Patient    Methods Explanation    Comprehension Verbalized understanding            PT Short Term Goals - 06/02/20 1453      PT SHORT TERM GOAL #1   Title Patient will report at least 50%  improvement in overall symptoms and/or function to demonstrate improved functional mobility    Time 3    Period Weeks    Status New    Target Date 06/23/20      PT SHORT TERM GOAL #2   Title Patient will be able to ambulate at least 226 feet in 2 minutes with least restrictive assistive device to demonstrate improved ambulatory mobilty.    Time 3    Period Weeks    Status New    Target Date 06/23/20      PT SHORT TERM GOAL #3   Title Patient will be independent in self management strategies to improve quality of life and functional outcomes.    Time 3    Period Weeks    Status New    Target Date 06/23/20             PT Long Term Goals - 06/02/20 1454      PT LONG TERM GOAL #1   Title Patient will be able to ambulate at least 226 feet in 2 minutes without assistive device to demonstrate improved ambulatory mobilty.    Time 6    Period Weeks    Status New    Target Date 07/14/20      PT LONG TERM GOAL #2   Title Patient will report at least 75% improvement in overall symptoms and/or function to demonstrate improved functional mobility    Time 6    Period Weeks    Status New    Target Date 07/14/20      PT LONG TERM GOAL #3   Title Patient will demonstrate reduced swelling in right lower extremity with measurments on left and right leg measuring withing 1/2 inche of eachother.    Time 6    Period Weeks    Status New    Target Date 07/14/20      PT LONG TERM GOAL #4   Title Patient will improve on FOTO score to meet predicted outcomes to demonstrate improved functional mobility.    Time 6    Period Weeks    Status New    Target Date 07/14/20                 Plan - 06/06/20 1408    Clinical Impression Statement Session limited secondary to late arrival. Incision looking good with no signs of infection. Added additional hip isometric to HEP. Patient stuck in hip/lumbar flexion with walking which is  how she ambulated at the start of therapy prior to surgery.  Difficult to correct during walking so added prone lying on pillows which was tolerated well but hamstring curl exercise was very challenging. Improved ability to walk noted afterwards. Patient to see lymph specialist next session to assess chronic leg swelling. Will continue with current POC.    Personal Factors and Comorbidities Comorbidity 1    Comorbidities osteoporosis    Examination-Activity Limitations Bed Mobility;Dressing;Locomotion Level;Reach Overhead;Lift;Hygiene/Grooming;Transfers;Stand;Stairs;Squat;Bend    Examination-Participation Restrictions Community Activity;Driving;Meal Prep;Shop    Stability/Clinical Decision Making Stable/Uncomplicated    Rehab Potential Good    PT Frequency 3x / week    PT Duration 6 weeks    PT Treatment/Interventions ADLs/Self Care Home Management;Aquatic Therapy;Cryotherapy;Electrical Stimulation;Moist Heat;Traction;Manual techniques;Therapeutic exercise;Therapeutic activities;Functional mobility training;Stair training;Gait training;DME Instruction;Neuromuscular re-education;Patient/family education;Dry needling;Passive range of motion;Joint Manipulations    PT Next Visit Plan assess right leg swelling- present prior to surgery, isometrics, hip ROM within precautions, functional strength, walking    PT Home Exercise Plan walking with walker; hip abd isometric           Patient will benefit from skilled therapeutic intervention in order to improve the following deficits and impairments:  Abnormal gait,Decreased endurance,Decreased skin integrity,Increased edema,Decreased scar mobility,Decreased activity tolerance,Decreased balance,Decreased range of motion,Decreased mobility,Difficulty walking,Pain,Decreased strength  Visit Diagnosis: Pain in right hip  Difficulty in walking, not elsewhere classified  Muscle weakness (generalized)     Problem List Patient Active Problem List   Diagnosis Date Noted  . Leg swelling 03/31/2020  . Venous  insufficiency of both lower extremities 03/31/2020  . Preoperative general physical examination 03/06/2020  . Vitamin D deficiency 03/06/2020  . Prediabetes 03/06/2020  . Knee pain, right 04/06/2019  . Left ankle sprain 04/06/2019  . Personal history of colonic polyps 07/12/2018  . Ingrown right big toenail 03/23/2017  . Hip pain, chronic, right 02/03/2016  . GERD (gastroesophageal reflux disease) 12/26/2014  . Diverticulosis of colon without hemorrhage 07/05/2014  . FH: pancreatic cancer 01/26/2014  . FH: glaucoma 03/24/2013  . Overweight (BMI 25.0-29.9) 03/13/2012  . Hyperlipidemia LDL goal <100 04/03/2009  . Essential hypertension 04/03/2009    2:47 PM, 06/06/20 Jerene Pitch, DPT Physical Therapy with [facility]  [phone] office  [place] [address] [place], [place], [address] Phone: [phone]   Fax:  [phone]  Name: Ladell Bey MRN: [id number] Date of Birth: 1945-07-19

## 2020-06-08 ENCOUNTER — Encounter: Payer: Self-pay | Admitting: Family Medicine

## 2020-06-08 MED ORDER — TRIAMTERENE-HCTZ 37.5-25 MG PO TABS: 1.0000 | ORAL_TABLET | Freq: Every day | ORAL | 2 refills | Status: DC

## 2020-06-08 MED ORDER — AMLODIPINE BESYLATE 2.5 MG PO TABS: 2.5000 mg | ORAL_TABLET | Freq: Every day | ORAL | 2 refills | Status: DC

## 2020-06-08 NOTE — Assessment & Plan Note (Signed)
  Patient re-educated about  the importance of commitment to a  minimum of 150 minutes of exercise per week as able.  The importance of healthy food choices with portion control discussed, as well as eating regularly and within a 12 hour window most days. The need to choose "clean , green" food 50 to 75% of the time is discussed, as well as to make water the primary drink and set a goal of 64 ounces water daily.    Weight /BMI 05/16/2020 05/07/2020 03/31/2020  WEIGHT 166 lb 166 lb 166 lb 1.9 oz  HEIGHT - 5' 5.5" 5' 5.5"  BMI 27.2 kg/m2 27.2 kg/m2 27.22 kg/m2

## 2020-06-08 NOTE — Assessment & Plan Note (Signed)
Hyperlipidemia:Low fat diet discussed and encouraged.   Lipid Panel  Lab Results  Component Value Date   CHOL 249 (H) 03/05/2020   HDL 111 03/05/2020   LDLCALC 125 (H) 03/05/2020   TRIG 80 03/05/2020   CHOLHDL 2.2 03/05/2020    Dietary management only, low risk ratio for CAD

## 2020-06-08 NOTE — Assessment & Plan Note (Signed)
Elevated at last visit , prior to this has been controlled, needs sooner appt to check bP, will reach out and ask her to do home checks DASH diet and commitment to daily physical activity for a minimum of 30 minutes discussed and encouraged, as a part of hypertension management. The importance of attaining a healthy weight is also discussed.  BP/Weight 05/16/2020 05/07/2020 03/31/2020 03/04/2020 08/20/2019 04/03/2019 11/10/2097  Systolic BP 068 934 068 403 353 317 409  Diastolic BP 83 79 88 74 78 78 68  Wt. (Lbs) 166 166 166.12 166.4 164 164.04 164  BMI 27.2 27.2 27.22 27.27 26.88 26.88 27.29

## 2020-06-09 ENCOUNTER — Other Ambulatory Visit: Payer: Self-pay

## 2020-06-09 ENCOUNTER — Encounter (HOSPITAL_COMMUNITY): Payer: Self-pay | Admitting: Physical Therapy

## 2020-06-09 ENCOUNTER — Ambulatory Visit (HOSPITAL_COMMUNITY): Payer: Medicare Other | Admitting: Physical Therapy

## 2020-06-09 DIAGNOSIS — R262 Difficulty in walking, not elsewhere classified: Secondary | ICD-10-CM

## 2020-06-09 DIAGNOSIS — M25561 Pain in right knee: Secondary | ICD-10-CM | POA: Diagnosis not present

## 2020-06-09 DIAGNOSIS — M25551 Pain in right hip: Secondary | ICD-10-CM

## 2020-06-09 DIAGNOSIS — G8929 Other chronic pain: Secondary | ICD-10-CM | POA: Diagnosis not present

## 2020-06-09 DIAGNOSIS — M6281 Muscle weakness (generalized): Secondary | ICD-10-CM

## 2020-06-09 NOTE — Therapy (Signed)
[place], [place], [address] Phone: [phone]   Fax:  [phone]  Physical Therapy Treatment  [name] MRN: [id number] Date of Birth: 11-18-1945 Referring Provider (PT): Earlie Server, MD   Encounter Date: 06/09/2020   PT End of Session - 06/09/20 1447    Visit Number 4    Number of Visits 18    Date for PT Re-Evaluation 07/14/20    Authorization Type medicare, secondary BCBS no auth    Progress Note Due on Visit 10    PT Start Time 1450    PT Stop Time 1530    PT Time Calculation ([name]) 40 [name]    Equipment Utilized During Treatment Gait belt    Activity Tolerance Patient tolerated treatment well;Patient limited by fatigue    Behavior During Therapy [facility] for tasks assessed/performed           Past Medical History:  Diagnosis Date  . Allergy   . Arthritis   . Diverticulosis 05/2009  . GERD (gastroesophageal reflux disease)   . Hyperlipidemia   . Hypertension     Past Surgical History:  Procedure Laterality Date  . ABDOMINAL HYSTERECTOMY  1990   for fibroids ovaries remain   . CHOLECYSTECTOMY  1980  . COLONOSCOPY  05/2009   rare diverticula, small internal hemorrhoids. Next colonoscopy in 5-10 yrs.  . COLONOSCOPY N/A 10/23/2018   Procedure: COLONOSCOPY;  Surgeon: Danie Binder, MD;  [facility];  Service: Endoscopy;  Laterality: N/A;  2:00pm  . JOINT REPLACEMENT N/A    Phreesia 06/02/2020  . left nipple inversion bx benign  2000  . POLYPECTOMY  10/23/2018   Procedure: POLYPECTOMY;  Surgeon: Danie Binder, MD;  [facility];  Service: Endoscopy;;  . TOTAL HIP ARTHROPLASTY Right 05/16/2020   Procedure: TOTAL HIP ARTHROPLASTY;  Surgeon: Earlie Server, MD;  [facility];  Service: Orthopedics;  Laterality: Right;    There were no vitals filed for this visit.   Subjective Assessment - 06/09/20 1448    Subjective Pt states that she is  more tired in the afternoon, states that she is still taking pain meds on a constant basis .    Pertinent History arthritis    How long can you stand comfortably? 2-3 minutes    How long can you walk comfortably? 2-3 minutes    Patient Stated Goals to be able to walk without a walker and cane    Currently in Pain? Yes    Pain Score 2     Pain Location Hip    Pain Orientation Right    Pain Descriptors / Indicators Dull    Pain Onset More than a month ago    Pain Frequency Intermittent    Aggravating Factors  bending and moving    Pain Relieving Factors rest and pain meds                             [facility] Adult PT Treatment/Exercise - 06/09/20 0001      Ambulation/Gait   Ambulation/Gait Yes    Ambulation/Gait Assistance 6: Modified independent (Device/Increase time)    Ambulation Distance (Feet) 300 Feet    Assistive device Straight cane    Gait Pattern Decreased hip/knee flexion - right;Decreased stride length;Decreased weight shift to right;Trunk flexed      Exercises   Exercises Knee/Hip      Knee/Hip  Exercises: Standing   Heel Raises Both;15 reps    Functional Squat 10 reps    SLS 2 x Lt 60" RT 20"  seconds each    Other Standing Knee Exercises marching x 10    Other Standing Knee Exercises Rt hip abduction /extension x 10 each                    PT Short Term Goals - 06/09/20 1448      PT SHORT TERM GOAL #1   Title Patient will report at least 50% improvement in overall symptoms and/or function to demonstrate improved functional mobility    Time 3    Period Weeks    Status New    Target Date 06/23/20      PT SHORT TERM GOAL #2   Title Patient will be able to ambulate at least 226 feet in 2 minutes with least restrictive assistive device to demonstrate improved ambulatory mobilty.    Time 3    Period Weeks    Status On-going    Target Date 06/23/20      PT SHORT TERM GOAL #3   Title Patient will be independent in self management  strategies to improve quality of life and functional outcomes.    Time 3    Period Weeks    Status On-going    Target Date 06/23/20             PT Long Term Goals - 06/09/20 1448      PT LONG TERM GOAL #1   Title Patient will be able to ambulate at least 226 feet in 2 minutes without assistive device to demonstrate improved ambulatory mobilty.    Time 6    Period Weeks    Status On-going      PT LONG TERM GOAL #2   Title Patient will report at least 75% improvement in overall symptoms and/or function to demonstrate improved functional mobility    Time 6    Period Weeks    Status On-going      PT LONG TERM GOAL #3   Title Patient will demonstrate reduced swelling in right lower extremity with measurments on left and right leg measuring withing 1/2 inche of eachother.    Time 6    Period Weeks    Status On-going      PT LONG TERM GOAL #4   Title Patient will improve on FOTO score to meet predicted outcomes to demonstrate improved functional mobility.    Time 6    Period Weeks    Status On-going      PT LONG TERM GOAL #5   Status On-going                 Plan - 06/09/20 1516    Clinical Impression Statement Pt states that she has noted that her selling is going down.  Session focused on gt with cane as well as introducing  hip strengthening as well as balance.  Added manual for swelling.    Personal Factors and Comorbidities Comorbidity 1    Comorbidities osteoporosis    Examination-Activity Limitations Bed Mobility;Dressing;Locomotion Level;Reach Overhead;Lift;Hygiene/Grooming;Transfers;Stand;Stairs;Squat;Bend    Examination-Participation Restrictions Community Activity;Driving;Meal Prep;Shop    Stability/Clinical Decision Making Stable/Uncomplicated    Rehab Potential Good    PT Frequency 3x / week    PT Duration 6 weeks    PT Treatment/Interventions ADLs/Self Care Home Management;Aquatic Therapy;Cryotherapy;Electrical Stimulation;Moist Heat;Traction;Manual  techniques;Therapeutic exercise;Therapeutic activities;Functional mobility training;Stair training;Gait training;DME Instruction;Neuromuscular re-education;Patient/family education;Dry  needling;Passive range of motion;Joint Manipulations    PT Next Visit Plan manual for swelling , isometrics, hip ROM within precautions, functional strength, walking with cane.See how pt responds with spandex and knee high compression garments if swelling is still not controlled pt may want to begin lymphedema treatment following hip treatment.    PT Home Exercise Plan walking with walker; hip abd isometric           Patient will benefit from skilled therapeutic intervention in order to improve the following deficits and impairments:  Abnormal gait,Decreased endurance,Decreased skin integrity,Increased edema,Decreased scar mobility,Decreased activity tolerance,Decreased balance,Decreased range of motion,Decreased mobility,Difficulty walking,Pain,Decreased strength  Visit Diagnosis: Pain in right hip  Difficulty in walking, not elsewhere classified  Muscle weakness (generalized)     Problem List Patient Active Problem List   Diagnosis Date Noted  . Leg swelling 03/31/2020  . Venous insufficiency of both lower extremities 03/31/2020  . Vitamin D deficiency 03/06/2020  . Prediabetes 03/06/2020  . Knee pain, right 04/06/2019  . Left ankle sprain 04/06/2019  . Personal history of colonic polyps 07/12/2018  . Ingrown right big toenail 03/23/2017  . Hip pain, chronic, right 02/03/2016  . GERD (gastroesophageal reflux disease) 12/26/2014  . Diverticulosis of colon without hemorrhage 07/05/2014  . FH: pancreatic cancer 01/26/2014  . FH: glaucoma 03/24/2013  . Overweight (BMI 25.0-29.9) 03/13/2012  . Hyperlipidemia LDL goal <100 04/03/2009  . Essential hypertension 04/03/2009    Rayetta Humphrey, PT CLT [phone] 06/09/2020, 5:07 PM  [place] [address] [place], [place], [address] Phone: [phone]   Fax:  [phone]  Name: Adrienne [name] MRN: [id number] Date of Birth: 06-02-1945

## 2020-06-11 ENCOUNTER — Ambulatory Visit (HOSPITAL_COMMUNITY): Payer: Medicare Other

## 2020-06-11 ENCOUNTER — Other Ambulatory Visit: Payer: Self-pay

## 2020-06-11 DIAGNOSIS — M25551 Pain in right hip: Secondary | ICD-10-CM

## 2020-06-11 DIAGNOSIS — M6281 Muscle weakness (generalized): Secondary | ICD-10-CM

## 2020-06-11 DIAGNOSIS — R262 Difficulty in walking, not elsewhere classified: Secondary | ICD-10-CM

## 2020-06-11 DIAGNOSIS — G8929 Other chronic pain: Secondary | ICD-10-CM | POA: Diagnosis not present

## 2020-06-11 DIAGNOSIS — M25561 Pain in right knee: Secondary | ICD-10-CM | POA: Diagnosis not present

## 2020-06-11 DIAGNOSIS — M1611 Unilateral primary osteoarthritis, right hip: Secondary | ICD-10-CM | POA: Diagnosis not present

## 2020-06-11 NOTE — Therapy (Signed)
[place], [place], [address] Phone: [phone]   Fax:  [phone]  Physical Therapy Treatment  [name] MRN: [id number] Date of Birth: 18-Feb-1946 Referring Provider (PT): Earlie Server, MD   Encounter Date: 06/11/2020   PT End of Session - 06/11/20 1408    Visit Number 5    Number of Visits 18    Date for PT Re-Evaluation 07/14/20    Authorization Type medicare, secondary BCBS no auth    Progress Note Due on Visit 10    PT Start Time 1400   late arrival   PT Stop Time 1435    PT Time Calculation (min) 35 min    Equipment Utilized During Treatment Gait belt    Activity Tolerance Patient tolerated treatment well;Patient limited by fatigue    Behavior During Therapy [facility] for tasks assessed/performed           Past Medical History:  Diagnosis Date  . Allergy   . Arthritis   . Diverticulosis 05/2009  . GERD (gastroesophageal reflux disease)   . Hyperlipidemia   . Hypertension     Past Surgical History:  Procedure Laterality Date  . ABDOMINAL HYSTERECTOMY  1990   for fibroids ovaries remain   . CHOLECYSTECTOMY  1980  . COLONOSCOPY  05/2009   rare diverticula, small internal hemorrhoids. Next colonoscopy in 5-10 yrs.  . COLONOSCOPY N/A 10/23/2018   Procedure: COLONOSCOPY;  Surgeon: Danie Binder, MD;  [facility];  Service: Endoscopy;  Laterality: N/A;  2:00pm  . JOINT REPLACEMENT N/A    Phreesia 06/02/2020  . left nipple inversion bx benign  2000  . POLYPECTOMY  10/23/2018   Procedure: POLYPECTOMY;  Surgeon: Danie Binder, MD;  [facility];  Service: Endoscopy;;  . TOTAL HIP ARTHROPLASTY Right 05/16/2020   Procedure: TOTAL HIP ARTHROPLASTY;  Surgeon: Earlie Server, MD;  [facility];  Service: Orthopedics;  Laterality: Right;    There were no vitals filed for this visit.   Subjective Assessment - 06/11/20 1410    Subjective Reports  improved activity tolerance and less pain in her hip.  She is using pain medicine but doing her exercises faithfully    Pertinent History arthritis    How long can you stand comfortably? 2-3 minutes    How long can you walk comfortably? 2-3 minutes    Patient Stated Goals to be able to walk without a walker and cane    Pain Onset More than a month ago              [facility] PT Assessment - 06/11/20 0001      Assessment   Medical Diagnosis R THA    Referring Provider (PT) Earlie Server, MD    Onset Date/Surgical Date 05/17/20      Precautions   Precautions Posterior Hip                         [facility] Adult PT Treatment/Exercise - 06/11/20 0001      Knee/Hip Exercises: Standing   Other Standing Knee Exercises sidestepping at counter x 2 min    Other Standing Knee Exercises left foot lift-offs with foot in cabinet for RLE stance stability 3x10      Knee/Hip Exercises: Supine   Quad Sets Strengthening;Right;2 sets;10 reps    Bridges Strengthening;1 set;10 reps    Bridges with [employer] Strengthening;2 sets;5 reps  PT Education - 06/11/20 1430    Education Details education on HEP updates    Person(s) Educated Patient    Methods Explanation;Handout    Comprehension Verbalized understanding            PT Short Term Goals - 06/09/20 1448      PT SHORT TERM GOAL #1   Title Patient will report at least 50% improvement in overall symptoms and/or function to demonstrate improved functional mobility    Time 3    Period Weeks    Status New    Target Date 06/23/20      PT SHORT TERM GOAL #2   Title Patient will be able to ambulate at least 226 feet in 2 minutes with least restrictive assistive device to demonstrate improved ambulatory mobilty.    Time 3    Period Weeks    Status On-going    Target Date 06/23/20      PT SHORT TERM GOAL #3   Title Patient will be independent in self management strategies to improve quality of life  and functional outcomes.    Time 3    Period Weeks    Status On-going    Target Date 06/23/20             PT Long Term Goals - 06/09/20 1448      PT LONG TERM GOAL #1   Title Patient will be able to ambulate at least 226 feet in 2 minutes without assistive device to demonstrate improved ambulatory mobilty.    Time 6    Period Weeks    Status On-going      PT LONG TERM GOAL #2   Title Patient will report at least 75% improvement in overall symptoms and/or function to demonstrate improved functional mobility    Time 6    Period Weeks    Status On-going      PT LONG TERM GOAL #3   Title Patient will demonstrate reduced swelling in right lower extremity with measurments on left and right leg measuring withing 1/2 inche of eachother.    Time 6    Period Weeks    Status On-going      PT LONG TERM GOAL #4   Title Patient will improve on FOTO score to meet predicted outcomes to demonstrate improved functional mobility.    Time 6    Period Weeks    Status On-going      PT LONG TERM GOAL #5   Status On-going                 Plan - 06/11/20 1433    Clinical Impression Statement Pt progressing well with POC details and demonstrating improved RLE stance stability with ambulation but continues with Trendelenberg due to hip abductor weakness. Continued PT sessions indicated to improve RLE strength and facilitate normalized gait pattern and progress to ambulation with no AD.    Personal Factors and Comorbidities Comorbidity 1    Comorbidities osteoporosis    Examination-Activity Limitations Bed Mobility;Dressing;Locomotion Level;Reach Overhead;Lift;Hygiene/Grooming;Transfers;Stand;Stairs;Squat;Bend    Examination-Participation Restrictions Community Activity;Driving;Meal Prep;Shop    Stability/Clinical Decision Making Stable/Uncomplicated    Rehab Potential Good    PT Frequency 3x / week    PT Duration 6 weeks    PT Treatment/Interventions ADLs/Self Care Home  Management;Aquatic Therapy;Cryotherapy;Electrical Stimulation;Moist Heat;Traction;Manual techniques;Therapeutic exercise;Therapeutic activities;Functional mobility training;Stair training;Gait training;DME Instruction;Neuromuscular re-education;Patient/family education;Dry needling;Passive range of motion;Joint Manipulations    PT Next Visit Plan manual for swelling , isometrics, hip ROM within precautions,  functional strength, walking with cane.See how pt responds with spandex and knee high compression garments if swelling is still not controlled pt may want to begin lymphedema treatment following hip treatment.    PT Home Exercise Plan walking with walker; hip abd isometric           Patient will benefit from skilled therapeutic intervention in order to improve the following deficits and impairments:  Abnormal gait,Decreased endurance,Decreased skin integrity,Increased edema,Decreased scar mobility,Decreased activity tolerance,Decreased balance,Decreased range of motion,Decreased mobility,Difficulty walking,Pain,Decreased strength  Visit Diagnosis: Pain in right hip  Difficulty in walking, not elsewhere classified  Muscle weakness (generalized)     Problem List Patient Active Problem List   Diagnosis Date Noted  . Leg swelling 03/31/2020  . Venous insufficiency of both lower extremities 03/31/2020  . Vitamin D deficiency 03/06/2020  . Prediabetes 03/06/2020  . Knee pain, right 04/06/2019  . Left ankle sprain 04/06/2019  . Personal history of colonic polyps 07/12/2018  . Ingrown right big toenail 03/23/2017  . Hip pain, chronic, right 02/03/2016  . GERD (gastroesophageal reflux disease) 12/26/2014  . Diverticulosis of colon without hemorrhage 07/05/2014  . FH: pancreatic cancer 01/26/2014  . FH: glaucoma 03/24/2013  . Overweight (BMI 25.0-29.9) 03/13/2012  . Hyperlipidemia LDL goal <100 04/03/2009  . Essential hypertension 04/03/2009    3:29 PM, 06/11/20 M. Sherlyn Lees,  PT, DPT Physical Therapist- [place] Office Number: [phone]  [place] [address] [place], [place], [address] Phone: [phone]   Fax:  [phone]  Name: Adrienne [name] MRN: [id number] Date of Birth: [birth year]

## 2020-06-11 NOTE — Patient Instructions (Signed)
Access Code: [email or web address] URL: [email or web address] Date: 06/11/2020 Prepared by: Sherlyn Lees  Exercises Supine Bridge with [employer] Between Knees - 1 x daily - 7 x weekly - 3 sets - 10 reps Side Stepping with Counter Support - 1 x daily - 7 x weekly Standing Forward Toe Taps on Box (BKA) - 1 x daily - 7 x weekly - 3 sets - 10 reps

## 2020-06-13 ENCOUNTER — Encounter (HOSPITAL_COMMUNITY): Payer: Self-pay

## 2020-06-13 ENCOUNTER — Ambulatory Visit (HOSPITAL_COMMUNITY): Payer: Medicare Other

## 2020-06-13 ENCOUNTER — Other Ambulatory Visit: Payer: Self-pay

## 2020-06-13 DIAGNOSIS — M6281 Muscle weakness (generalized): Secondary | ICD-10-CM

## 2020-06-13 DIAGNOSIS — R262 Difficulty in walking, not elsewhere classified: Secondary | ICD-10-CM

## 2020-06-13 DIAGNOSIS — M25561 Pain in right knee: Secondary | ICD-10-CM | POA: Diagnosis not present

## 2020-06-13 DIAGNOSIS — M25551 Pain in right hip: Secondary | ICD-10-CM | POA: Diagnosis not present

## 2020-06-13 DIAGNOSIS — G8929 Other chronic pain: Secondary | ICD-10-CM | POA: Diagnosis not present

## 2020-06-13 NOTE — Therapy (Signed)
[name], [place], [address] Phone: [phone]   Fax:  [phone]  Physical Therapy Treatment  [name] MRN: [id number] Date of Birth: June 01, 1945 Referring Provider (PT): Earlie Server, MD   Encounter Date: 06/13/2020   PT End of Session - 06/13/20 1446    Visit Number 6    Number of Visits 18    Date for PT Re-Evaluation 07/14/20    Authorization Type medicare, secondary BCBS no auth    Progress Note Due on Visit 10    PT Start Time 1405    PT Stop Time 1445    PT Time Calculation (min) 40 min    Activity Tolerance Patient tolerated treatment well;Patient limited by fatigue    Behavior During Therapy [facility] for tasks assessed/performed           Past Medical History:  Diagnosis Date  . Allergy   . Arthritis   . Diverticulosis 05/2009  . GERD (gastroesophageal reflux disease)   . Hyperlipidemia   . Hypertension     Past Surgical History:  Procedure Laterality Date  . ABDOMINAL HYSTERECTOMY  1990   for fibroids ovaries remain   . CHOLECYSTECTOMY  1980  . COLONOSCOPY  05/2009   rare diverticula, small internal hemorrhoids. Next colonoscopy in 5-10 yrs.  . COLONOSCOPY N/A 10/23/2018   Procedure: COLONOSCOPY;  Surgeon: Danie Binder, MD;  [facility];  Service: Endoscopy;  Laterality: N/A;  2:00pm  . JOINT REPLACEMENT N/A    Phreesia 06/02/2020  . left nipple inversion bx benign  2000  . POLYPECTOMY  10/23/2018   Procedure: POLYPECTOMY;  Surgeon: Danie Binder, MD;  [facility];  Service: Endoscopy;;  . TOTAL HIP ARTHROPLASTY Right 05/16/2020   Procedure: TOTAL HIP ARTHROPLASTY;  Surgeon: Earlie Server, MD;  [facility];  Service: Orthopedics;  Laterality: Right;    There were no vitals filed for this visit.   Subjective Assessment - 06/13/20 1410    Subjective Pt reports she has reduced need for pain medication to twice a day.   Arrived with [facility] [place] [facility], stated she uses RW as needed around the house    Pertinent History arthritis    Patient Stated Goals to be able to walk without a walker and cane    Currently in Pain? Yes    Pain Score 3     Pain Location Hip    Pain Orientation Right    Pain Descriptors / Indicators Sore    Pain Type Surgical pain    Pain Onset More than a month ago    Pain Frequency Intermittent    Aggravating Factors  bending and moving, weight bearing    Pain Relieving Factors rest and pain meds                             [facility] Adult PT Treatment/Exercise - 06/13/20 0001      Exercises   Exercises Knee/Hip      Knee/Hip Exercises: Standing   Heel Raises Both;15 reps    Functional Squat 15 reps    Rocker Board 2 minutes    Other Standing Knee Exercises sidestep down blue line 2RT    Other Standing Knee Exercises BLE hip abd/ext 15x each      Manual Therapy   Manual Therapy Manual Lymphatic Drainage (MLD)    Manual therapy comments Manual complete separate than  rest of tx    Manual Lymphatic Drainage (MLD) Decognestive techniques for Rt LE edema control                    PT Short Term Goals - 06/09/20 1448      PT SHORT TERM GOAL #1   Title Patient will report at least 50% improvement in overall symptoms and/or function to demonstrate improved functional mobility    Time 3    Period Weeks    Status New    Target Date 06/23/20      PT SHORT TERM GOAL #2   Title Patient will be able to ambulate at least 226 feet in 2 minutes with least restrictive assistive device to demonstrate improved ambulatory mobilty.    Time 3    Period Weeks    Status On-going    Target Date 06/23/20      PT SHORT TERM GOAL #3   Title Patient will be independent in self management strategies to improve quality of life and functional outcomes.    Time 3    Period Weeks    Status On-going    Target Date 06/23/20             PT Long Term Goals - 06/09/20 1448       PT LONG TERM GOAL #1   Title Patient will be able to ambulate at least 226 feet in 2 minutes without assistive device to demonstrate improved ambulatory mobilty.    Time 6    Period Weeks    Status On-going      PT LONG TERM GOAL #2   Title Patient will report at least 75% improvement in overall symptoms and/or function to demonstrate improved functional mobility    Time 6    Period Weeks    Status On-going      PT LONG TERM GOAL #3   Title Patient will demonstrate reduced swelling in right lower extremity with measurments on left and right leg measuring withing 1/2 inche of eachother.    Time 6    Period Weeks    Status On-going      PT LONG TERM GOAL #4   Title Patient will improve on FOTO score to meet predicted outcomes to demonstrate improved functional mobility.    Time 6    Period Weeks    Status On-going      PT LONG TERM GOAL #5   Status On-going                 Plan - 06/13/20 1556    Clinical Impression Statement Noted induration near incision, added decognestive technqiues for edema control.  Discussed benefits of additional compression garment.  Continued functional strengthening exercises for hip strengtheing.  Noted [place] gait mechanics due to weak glut med.  No reports of increased pain through session.    Personal Factors and Comorbidities Comorbidity 1    Comorbidities osteoporosis    Examination-Activity Limitations Bed Mobility;Dressing;Locomotion Level;Reach Overhead;Lift;Hygiene/Grooming;Transfers;Stand;Stairs;Squat;Bend    Examination-Participation Restrictions Community Activity;Driving;Meal Prep;Shop    Stability/Clinical Decision Making Stable/Uncomplicated    Clinical Decision Making Low    Rehab Potential Good    PT Frequency 3x / week    PT Duration 6 weeks    PT Treatment/Interventions ADLs/Self Care Home Management;Aquatic Therapy;Cryotherapy;Electrical Stimulation;Moist Heat;Traction;Manual techniques;Therapeutic  exercise;Therapeutic activities;Functional mobility training;Stair training;Gait training;DME Instruction;Neuromuscular re-education;Patient/family education;Dry needling;Passive range of motion;Joint Manipulations    PT Next Visit Plan manual for swelling , isometrics, hip ROM within  precautions, functional strength, walking with cane.See how pt responds with spandex and knee high compression garments if swelling is still not controlled pt may want to begin lymphedema treatment following hip treatment.    PT Home Exercise Plan walking with walker; hip abd isometric           Patient will benefit from skilled therapeutic intervention in order to improve the following deficits and impairments:  Abnormal gait,Decreased endurance,Decreased skin integrity,Increased edema,Decreased scar mobility,Decreased activity tolerance,Decreased balance,Decreased range of motion,Decreased mobility,Difficulty walking,Pain,Decreased strength  Visit Diagnosis: Pain in right hip  Difficulty in walking, not elsewhere classified  Muscle weakness (generalized)     Problem List Patient Active Problem List   Diagnosis Date Noted  . Leg swelling 03/31/2020  . Venous insufficiency of both lower extremities 03/31/2020  . Vitamin D deficiency 03/06/2020  . Prediabetes 03/06/2020  . Knee pain, right 04/06/2019  . Left ankle sprain 04/06/2019  . Personal history of colonic polyps 07/12/2018  . Ingrown right big toenail 03/23/2017  . Hip pain, chronic, right 02/03/2016  . GERD (gastroesophageal reflux disease) 12/26/2014  . Diverticulosis of colon without hemorrhage 07/05/2014  . FH: pancreatic cancer 01/26/2014  . FH: glaucoma 03/24/2013  . Overweight (BMI 25.0-29.9) 03/13/2012  . Hyperlipidemia LDL goal <100 04/03/2009  . Essential hypertension 04/03/2009   Ihor Austin, LPTA/CLT; CBIS [phone]  Aldona Lento 06/13/2020, 5:50 PM  [place], [place], [address] Phone: [phone]   Fax:  [phone]  Name: Adrienne [name] MRN: [id number] Date of Birth: Dec 06, 1945

## 2020-06-16 ENCOUNTER — Ambulatory Visit (HOSPITAL_COMMUNITY): Payer: Medicare Other | Admitting: Physical Therapy

## 2020-06-16 ENCOUNTER — Other Ambulatory Visit: Payer: Self-pay

## 2020-06-16 DIAGNOSIS — G8929 Other chronic pain: Secondary | ICD-10-CM | POA: Diagnosis not present

## 2020-06-16 DIAGNOSIS — M25561 Pain in right knee: Secondary | ICD-10-CM | POA: Diagnosis not present

## 2020-06-16 DIAGNOSIS — M25551 Pain in right hip: Secondary | ICD-10-CM | POA: Diagnosis not present

## 2020-06-16 DIAGNOSIS — R262 Difficulty in walking, not elsewhere classified: Secondary | ICD-10-CM | POA: Diagnosis not present

## 2020-06-16 DIAGNOSIS — M6281 Muscle weakness (generalized): Secondary | ICD-10-CM | POA: Diagnosis not present

## 2020-06-16 NOTE — Therapy (Signed)
[place], [place], [address] Phone: [phone]   Fax:  [phone]  Physical Therapy Treatment  [name] MRN: [id number] Date of Birth: 01-13-1946 Referring Provider (PT): Earlie Server, MD   Encounter Date: 06/16/2020   PT End of Session - 06/16/20 1451    Visit Number 7    Number of Visits 18    Date for PT Re-Evaluation 07/14/20    Authorization Type medicare, secondary BCBS no auth    Progress Note Due on Visit 10    PT Start Time 1406    PT Stop Time 1450    PT Time Calculation (min) 44 min    Activity Tolerance Patient tolerated treatment well;Patient limited by fatigue    Behavior During Therapy [facility] for tasks assessed/performed           Past Medical History:  Diagnosis Date  . Allergy   . Arthritis   . Diverticulosis 05/2009  . GERD (gastroesophageal reflux disease)   . Hyperlipidemia   . Hypertension     Past Surgical History:  Procedure Laterality Date  . ABDOMINAL HYSTERECTOMY  1990   for fibroids ovaries remain   . CHOLECYSTECTOMY  1980  . COLONOSCOPY  05/2009   rare diverticula, small internal hemorrhoids. Next colonoscopy in 5-10 yrs.  . COLONOSCOPY N/A 10/23/2018   Procedure: COLONOSCOPY;  Surgeon: Danie Binder, MD;  [facility];  Service: Endoscopy;  Laterality: N/A;  2:00pm  . JOINT REPLACEMENT N/A    Phreesia 06/02/2020  . left nipple inversion bx benign  2000  . POLYPECTOMY  10/23/2018   Procedure: POLYPECTOMY;  Surgeon: Danie Binder, MD;  [facility];  Service: Endoscopy;;  . TOTAL HIP ARTHROPLASTY Right 05/16/2020   Procedure: TOTAL HIP ARTHROPLASTY;  Surgeon: Earlie Server, MD;  [facility];  Service: Orthopedics;  Laterality: Right;    There were no vitals filed for this visit.   Subjective Assessment - 06/16/20 1452    Subjective Pt stattes she is wearing spandex and thigh highs and is helping with her  edema.  STates she about fell over her cane 3 different times.  Currently 2/10 and reports compliance with HEP.              [facility] PT Assessment - 06/16/20 0001      Assessment   Medical Diagnosis R THA    Referring Provider (PT) Earlie Server, MD    Onset Date/Surgical Date 05/17/20      Precautions   Precautions Posterior Hip                         [facility] Adult PT Treatment/Exercise - 06/16/20 0001      Knee/Hip Exercises: Standing   Heel Raises 20 reps    Lateral Step Up Both;15 reps;Step Height: 4";Hand Hold: 2    Forward Step Up Both;15 reps;Step Height: 4";Hand Hold: 1    Functional Squat 15 reps    SLS 2 x RT 20"  seconds each    Other Standing Knee Exercises sidestep down blue line 2RT    Other Standing Knee Exercises BLE hip abd/ext 15x each      Manual Therapy   Manual Therapy Edema management    Manual therapy comments Manual complete separate than rest of tx    Edema Management retro massage and self instruction using pain roller for HEP  PT Short Term Goals - 06/09/20 1448      PT SHORT TERM GOAL #1   Title Patient will report at least 50% improvement in overall symptoms and/or function to demonstrate improved functional mobility    Time 3    Period Weeks    Status New    Target Date 06/23/20      PT SHORT TERM GOAL #2   Title Patient will be able to ambulate at least 226 feet in 2 minutes with least restrictive assistive device to demonstrate improved ambulatory mobilty.    Time 3    Period Weeks    Status On-going    Target Date 06/23/20      PT SHORT TERM GOAL #3   Title Patient will be independent in self management strategies to improve quality of life and functional outcomes.    Time 3    Period Weeks    Status On-going    Target Date 06/23/20             PT Long Term Goals - 06/09/20 1448      PT LONG TERM GOAL #1   Title Patient will be able to ambulate at least 226 feet in 2 minutes  without assistive device to demonstrate improved ambulatory mobilty.    Time 6    Period Weeks    Status On-going      PT LONG TERM GOAL #2   Title Patient will report at least 75% improvement in overall symptoms and/or function to demonstrate improved functional mobility    Time 6    Period Weeks    Status On-going      PT LONG TERM GOAL #3   Title Patient will demonstrate reduced swelling in right lower extremity with measurments on left and right leg measuring withing 1/2 inche of eachother.    Time 6    Period Weeks    Status On-going      PT LONG TERM GOAL #4   Title Patient will improve on FOTO score to meet predicted outcomes to demonstrate improved functional mobility.    Time 6    Period Weeks    Status On-going      PT LONG TERM GOAL #5   Status On-going                 Plan - 06/16/20 1452    Clinical Impression Statement Pt wearing thigh highs and spandex biker shorts/underwear over.  Continued with established therex.  Added step up activities with noted challeng with Rt, having to use bil UE to complete (finger tip holds).   Pt able to complete all exericses without compliants of pain, only challenge with lateral step ups and form with standing hip abduction.  Began forward step up with a slight hop up for momentum but able to correct and challenge mm to complete correctly with cues.  Noted difficulty getting Rt LE up into bed; instructed and added Rt SLR to help improve this.  Continued with manual and instructed with self massage using paint roller at home as well.    Personal Factors and Comorbidities Comorbidity 1    Comorbidities osteoporosis    Examination-Activity Limitations Bed Mobility;Dressing;Locomotion Level;Reach Overhead;Lift;Hygiene/Grooming;Transfers;Stand;Stairs;Squat;Bend    Examination-Participation Restrictions Community Activity;Driving;Meal Prep;Shop    Stability/Clinical Decision Making Stable/Uncomplicated    Rehab Potential Good    PT  Frequency 3x / week    PT Duration 6 weeks    PT Treatment/Interventions ADLs/Self Care Home Management;Aquatic Therapy;Cryotherapy;Electrical Stimulation;Moist Heat;Traction;Manual  techniques;Therapeutic exercise;Therapeutic activities;Functional mobility training;Stair training;Gait training;DME Instruction;Neuromuscular re-education;Patient/family education;Dry needling;Passive range of motion;Joint Manipulations    PT Next Visit Plan Continue with strengthening and general manual to reduce induration/edema.    PT Home Exercise Plan walking with walker; hip abd isometric           Patient will benefit from skilled therapeutic intervention in order to improve the following deficits and impairments:  Abnormal gait,Decreased endurance,Decreased skin integrity,Increased edema,Decreased scar mobility,Decreased activity tolerance,Decreased balance,Decreased range of motion,Decreased mobility,Difficulty walking,Pain,Decreased strength  Visit Diagnosis: Difficulty in walking, not elsewhere classified  Muscle weakness (generalized)     Problem List Patient Active Problem List   Diagnosis Date Noted  . Leg swelling 03/31/2020  . Venous insufficiency of both lower extremities 03/31/2020  . Vitamin D deficiency 03/06/2020  . Prediabetes 03/06/2020  . Knee pain, right 04/06/2019  . Left ankle sprain 04/06/2019  . Personal history of colonic polyps 07/12/2018  . Ingrown right big toenail 03/23/2017  . Hip pain, chronic, right 02/03/2016  . GERD (gastroesophageal reflux disease) 12/26/2014  . Diverticulosis of colon without hemorrhage 07/05/2014  . FH: pancreatic cancer 01/26/2014  . FH: glaucoma 03/24/2013  . Overweight (BMI 25.0-29.9) 03/13/2012  . Hyperlipidemia LDL goal <100 04/03/2009  . Essential hypertension 04/03/2009   Teena Irani, PTA/CLT [phone]  Teena Irani 06/16/2020, 2:53 PM  [place], [place], [address] Phone: [phone]   Fax:  [phone]  Name: Kaybree Williams MRN: [id number] Date of Birth: [birth year]

## 2020-06-18 ENCOUNTER — Encounter (HOSPITAL_COMMUNITY): Payer: Self-pay

## 2020-06-18 ENCOUNTER — Other Ambulatory Visit: Payer: Self-pay

## 2020-06-18 ENCOUNTER — Ambulatory Visit (HOSPITAL_COMMUNITY): Payer: Medicare Other

## 2020-06-18 DIAGNOSIS — G8929 Other chronic pain: Secondary | ICD-10-CM | POA: Diagnosis not present

## 2020-06-18 DIAGNOSIS — M25551 Pain in right hip: Secondary | ICD-10-CM

## 2020-06-18 DIAGNOSIS — M6281 Muscle weakness (generalized): Secondary | ICD-10-CM

## 2020-06-18 DIAGNOSIS — M25561 Pain in right knee: Secondary | ICD-10-CM | POA: Diagnosis not present

## 2020-06-18 DIAGNOSIS — R262 Difficulty in walking, not elsewhere classified: Secondary | ICD-10-CM

## 2020-06-18 NOTE — Therapy (Signed)
Adrienne [name], [place], [address] Phone: [phone]   Fax:  [phone]  Physical Therapy Treatment  [name] MRN: [id number] Date of Birth: 07-26-1945 Referring Provider (PT): Adrienne Server, MD   Encounter Date: 06/18/2020   PT End of Session - 06/18/20 1414    Visit Number 8    Number of Visits 18    Date for PT Re-Evaluation 07/14/20    Authorization Type medicare, secondary BCBS no auth    Progress Note Due on Visit 10    PT Start Time 1405    PT Stop Time 1453    PT Time Calculation (min) 48 min    Activity Tolerance Patient tolerated treatment well;Patient limited by fatigue    Behavior During Therapy [facility] for tasks assessed/performed           Past Medical History:  Diagnosis Date  . Allergy   . Arthritis   . Diverticulosis 05/2009  . GERD (gastroesophageal reflux disease)   . Hyperlipidemia   . Hypertension     Past Surgical History:  Procedure Laterality Date  . ABDOMINAL HYSTERECTOMY  1990   for fibroids ovaries remain   . CHOLECYSTECTOMY  1980  . COLONOSCOPY  05/2009   rare diverticula, small internal hemorrhoids. Next colonoscopy in 5-10 yrs.  . COLONOSCOPY N/A 10/23/2018   Procedure: COLONOSCOPY;  Surgeon: Adrienne Binder, MD;  [facility];  Service: Endoscopy;  Laterality: N/A;  2:00pm  . JOINT REPLACEMENT N/A    Phreesia 06/02/2020  . left nipple inversion bx benign  2000  . POLYPECTOMY  10/23/2018   Procedure: POLYPECTOMY;  Surgeon: Adrienne Binder, MD;  [facility];  Service: Endoscopy;;  . TOTAL HIP ARTHROPLASTY Right 05/16/2020   Procedure: TOTAL HIP ARTHROPLASTY;  Surgeon: Adrienne Server, MD;  [facility];  Service: Orthopedics;  Laterality: Right;    There were no vitals filed for this visit.   Subjective Assessment - 06/18/20 1410    Subjective Pt stated she has began massage on hip and wearing thigh high and spandex.   Reports main pain 2-3/10 when weight bearing Rt LE.    Patient Stated Goals to be able to walk without a walker and cane    Currently in Pain? Yes    Pain Score 2     Pain Location Hip    Pain Orientation Right    Pain Descriptors / Indicators Sore    Pain Type Surgical pain    Pain Onset More than a month ago    Pain Frequency Intermittent    Aggravating Factors  bending and moving, weight bearing    Pain Relieving Factors rest and pain meds              [facility] PT Assessment - 06/18/20 0001      Assessment   Medical Diagnosis R THA    Referring Provider (PT) Adrienne Server, MD    Onset Date/Surgical Date 05/16/20    Next MD Visit 06/30/20                         [facility] Adult PT Treatment/Exercise - 06/18/20 0001      Exercises   Exercises Knee/Hip      Knee/Hip Exercises: Standing   Heel Raises 20 reps    Heel Raises Limitations incline slope    Hip Abduction 15 reps    Hip Extension 15  reps    Lateral Step Up Both;15 reps;Step Height: 4";Hand Hold: 2    Forward Step Up Both;15 reps;Step Height: 4";Hand Hold: 1    Functional Squat 15 reps    SLS with Vectors 3x 5" BLE with HHA    Other Standing Knee Exercises hip hike 10x5      Manual Therapy   Manual Therapy Edema management    Manual therapy comments Manual complete separate than rest of tx    Edema Management retro massage and self instruction using pain roller for HEP                    PT Short Term Goals - 06/09/20 1448      PT SHORT TERM GOAL #1   Title Patient will report at least 50% improvement in overall symptoms and/or function to demonstrate improved functional mobility    Time 3    Period Weeks    Status New    Target Date 06/23/20      PT SHORT TERM GOAL #2   Title Patient will be able to ambulate at least 226 feet in 2 minutes with least restrictive assistive device to demonstrate improved ambulatory mobilty.    Time 3    Period Weeks    Status On-going    Target  Date 06/23/20      PT SHORT TERM GOAL #3   Title Patient will be independent in self management strategies to improve quality of life and functional outcomes.    Time 3    Period Weeks    Status On-going    Target Date 06/23/20             PT Long Term Goals - 06/09/20 1448      PT LONG TERM GOAL #1   Title Patient will be able to ambulate at least 226 feet in 2 minutes without assistive device to demonstrate improved ambulatory mobilty.    Time 6    Period Weeks    Status On-going      PT LONG TERM GOAL #2   Title Patient will report at least 75% improvement in overall symptoms and/or function to demonstrate improved functional mobility    Time 6    Period Weeks    Status On-going      PT LONG TERM GOAL #3   Title Patient will demonstrate reduced swelling in right lower extremity with measurments on left and right leg measuring withing 1/2 inche of eachother.    Time 6    Period Weeks    Status On-going      PT LONG TERM GOAL #4   Title Patient will improve on FOTO score to meet predicted outcomes to demonstrate improved functional mobility.    Time 6    Period Weeks    Status On-going      PT LONG TERM GOAL #5   Status On-going                 Plan - 06/18/20 1813    Clinical Impression Statement Session focus with proximal and functional strengthening.  Pt presents with some difficulty with mechanics during lateral steps up and to reduce hopping with forward step ups. Added hip hikes and vector stance for hip strengthening and balance training.  Pt tolerated well to additional exercises with no reports of pain.  EOS with manual for edema control.    Personal Factors and Comorbidities Comorbidity 1    Comorbidities osteoporosis  Examination-Activity Limitations Bed Mobility;Dressing;Locomotion Level;Reach Overhead;Lift;Hygiene/Grooming;Transfers;Stand;Stairs;Squat;Bend    Examination-Participation Restrictions Community Activity;Driving;Meal Prep;Shop     Stability/Clinical Decision Making Stable/Uncomplicated    Clinical Decision Making Low    Rehab Potential Good    PT Frequency 3x / week    PT Duration 6 weeks    PT Treatment/Interventions ADLs/Self Care Home Management;Aquatic Therapy;Cryotherapy;Electrical Stimulation;Moist Heat;Traction;Manual techniques;Therapeutic exercise;Therapeutic activities;Functional mobility training;Stair training;Gait training;DME Instruction;Neuromuscular re-education;Patient/family education;Dry needling;Passive range of motion;Joint Manipulations    PT Next Visit Plan Continue with strengthening and general manual to reduce induration/edema.    PT Home Exercise Plan walking with walker; hip abd isometric           Patient will benefit from skilled therapeutic intervention in order to improve the following deficits and impairments:  Abnormal gait,Decreased endurance,Decreased skin integrity,Increased edema,Decreased scar mobility,Decreased activity tolerance,Decreased balance,Decreased range of motion,Decreased mobility,Difficulty walking,Pain,Decreased strength  Visit Diagnosis: Muscle weakness (generalized)  Difficulty in walking, not elsewhere classified  Pain in right hip     Problem List Patient Active Problem List   Diagnosis Date Noted  . Leg swelling 03/31/2020  . Venous insufficiency of both lower extremities 03/31/2020  . Vitamin D deficiency 03/06/2020  . Prediabetes 03/06/2020  . Knee pain, right 04/06/2019  . Left ankle sprain 04/06/2019  . Personal history of colonic polyps 07/12/2018  . Ingrown right big toenail 03/23/2017  . Hip pain, chronic, right 02/03/2016  . GERD (gastroesophageal reflux disease) 12/26/2014  . Diverticulosis of colon without hemorrhage 07/05/2014  . FH: pancreatic cancer 01/26/2014  . FH: glaucoma 03/24/2013  . Overweight (BMI 25.0-29.9) 03/13/2012  . Hyperlipidemia LDL goal <100 04/03/2009  . Essential hypertension 04/03/2009   Ihor Austin,  LPTA/CLT; [place] Aldona Lento 06/18/2020, 6:28 PM  [place] [address] [place], [place], [address] Phone: [phone]   Fax:  [phone]  Name: Wanetta Funderburke MRN: [id number] Date of Birth: 1945-07-17

## 2020-06-20 ENCOUNTER — Other Ambulatory Visit: Payer: Self-pay

## 2020-06-20 ENCOUNTER — Ambulatory Visit (HOSPITAL_COMMUNITY): Payer: Medicare Other | Admitting: Physical Therapy

## 2020-06-20 ENCOUNTER — Encounter (HOSPITAL_COMMUNITY): Payer: Self-pay | Admitting: Physical Therapy

## 2020-06-20 DIAGNOSIS — M25551 Pain in right hip: Secondary | ICD-10-CM

## 2020-06-20 DIAGNOSIS — G8929 Other chronic pain: Secondary | ICD-10-CM | POA: Diagnosis not present

## 2020-06-20 DIAGNOSIS — R262 Difficulty in walking, not elsewhere classified: Secondary | ICD-10-CM | POA: Diagnosis not present

## 2020-06-20 DIAGNOSIS — M6281 Muscle weakness (generalized): Secondary | ICD-10-CM

## 2020-06-20 DIAGNOSIS — M25561 Pain in right knee: Secondary | ICD-10-CM | POA: Diagnosis not present

## 2020-06-20 NOTE — Therapy (Signed)
[place] [address] [place], [place], [address] Phone: [phone]   Fax:  [phone]  Physical Therapy Treatment and Progress Note  [name] MRN: [id number] Date of Birth: March 10, 1946 Referring Provider (PT): Earlie Server, MD  Progress Note Reporting Period 06/02/20 to 06/20/20  See note below for Objective Data and Assessment of Progress/Goals.       Encounter Date: 06/20/2020   PT End of Session - 06/20/20 1401    Visit Number 9    Number of Visits 18    Date for PT Re-Evaluation 07/14/20    Authorization Type medicare, secondary BCBS no auth    Progress Note Due on Visit 93    PT Start Time 1401    PT Stop Time 1440    PT Time Calculation (min) 39 min    Activity Tolerance Patient tolerated treatment well;Patient limited by fatigue    Behavior During Therapy [facility] for tasks assessed/performed           Past Medical History:  Diagnosis Date  . Allergy   . Arthritis   . Diverticulosis 05/2009  . GERD (gastroesophageal reflux disease)   . Hyperlipidemia   . Hypertension     Past Surgical History:  Procedure Laterality Date  . ABDOMINAL HYSTERECTOMY  1990   for fibroids ovaries remain   . CHOLECYSTECTOMY  1980  . COLONOSCOPY  05/2009   rare diverticula, small internal hemorrhoids. Next colonoscopy in 5-10 yrs.  . COLONOSCOPY N/A 10/23/2018   Procedure: COLONOSCOPY;  Surgeon: Danie Binder, MD;  [facility];  Service: Endoscopy;  Laterality: N/A;  2:00pm  . JOINT REPLACEMENT N/A    Phreesia 06/02/2020  . left nipple inversion bx benign  2000  . POLYPECTOMY  10/23/2018   Procedure: POLYPECTOMY;  Surgeon: Danie Binder, MD;  [facility];  Service: Endoscopy;;  . TOTAL HIP ARTHROPLASTY Right 05/16/2020   Procedure: TOTAL HIP ARTHROPLASTY;  Surgeon: Earlie Server, MD;  [facility];  Service: Orthopedics;  Laterality: Right;    There were no vitals filed  for this visit.   Subjective Assessment - 06/20/20 1405    Subjective States that she using the medication less. States she feels like she is getting better. States that she still has a rough time on the right side in terms of standing on the right side and not walking with the cane. States she feels about 75% better since the start of PT.    Patient Stated Goals to be able to walk without a walker and cane    Currently in Pain? Yes    Pain Score 3     Pain Location Hip    Pain Orientation Right    Pain Descriptors / Indicators Other (Comment)   different not quite pain   Pain Type Surgical pain    Pain Onset More than a month ago              [facility] PT Assessment - 06/20/20 0001      Assessment   Medical Diagnosis R THA    Referring Provider (PT) Earlie Server, MD    Onset Date/Surgical Date 05/16/20    Next MD Visit 06/30/20      Observation/Other Assessments   Focus on Therapeutic Outcomes (FOTO)  45% function (was 26% function)   predicted 54% function     Circumferential Edema   Circumferential - Right knee 16 inches, 14.25 inches  Circumferential - Left  knee 15 inches, calf 14 inches      Ambulation/Gait   Ambulation/Gait Yes    Ambulation/Gait Assistance 6: Modified independent (Device/Increase time)    Ambulation Distance (Feet) 298 Feet    Assistive device Straight cane    Gait Pattern Decreased hip/knee flexion - right;Decreased stride length;Decreased weight shift to right;Trunk flexed    Ambulation Surface Level;Indoor    Gait velocity decreased    Gait Comments 2MW                         [facility] Adult PT Treatment/Exercise - 06/20/20 0001      Knee/Hip Exercises: Standing   Other Standing Knee Exercises staggered stance with left leg on step and left step raises x10 5" holds    Other Standing Knee Exercises stairs 4" R focus and left UE assist - x30                  PT Education - 06/20/20 1431    Education Details on FOTO  Score, on progress made, on continued progress to make, on goals, on getting lymph order if she feels it would be beneficial.    Person(s) Educated Patient    Methods Explanation    Comprehension Verbalized understanding            PT Short Term Goals - 06/20/20 1401      PT SHORT TERM GOAL #1   Title Patient will report at least 50% improvement in overall symptoms and/or function to demonstrate improved functional mobility    Time 3    Period Weeks    Status Achieved    Target Date 06/23/20      PT SHORT TERM GOAL #2   Title Patient will be able to ambulate at least 226 feet in 2 minutes with least restrictive assistive device to demonstrate improved ambulatory mobilty.    Time 3    Period Weeks    Status Achieved    Target Date 06/23/20      PT SHORT TERM GOAL #3   Title Patient will be independent in self management strategies to improve quality of life and functional outcomes.    Time 3    Period Weeks    Status On-going    Target Date 06/23/20             PT Long Term Goals - 06/20/20 1414      PT LONG TERM GOAL #1   Title Patient will be able to ambulate at least 226 feet in 2 minutes without assistive device to demonstrate improved ambulatory mobilty.    Time 6    Period Weeks    Status On-going      PT LONG TERM GOAL #2   Title Patient will report at least 75% improvement in overall symptoms and/or function to demonstrate improved functional mobility    Time 6    Period Weeks    Status Achieved      PT LONG TERM GOAL #3   Title Patient will demonstrate reduced swelling in right lower extremity with measurments on left and right leg measuring withing 1/2 inche of eachother.    Time 6    Period Weeks    Status On-going      PT LONG TERM GOAL #4   Title Patient will improve on FOTO score to meet predicted outcomes to demonstrate improved functional mobility.    Time 6  Period Weeks    Status On-going      PT LONG TERM GOAL #5   Status On-going                  Plan - 06/20/20 1432    Clinical Impression Statement Patient present for a progress note on this date. She has met 2/3 short term goals and 1/ 4  long term goals at this time. Session focused on education of progress made and POC moving forward. Fatigue noted with exercises. Overall patient is progressing well towards the goals. Will continue with current POC as tolerated.    Personal Factors and Comorbidities Comorbidity 1    Comorbidities osteoporosis    Examination-Activity Limitations Bed Mobility;Dressing;Locomotion Level;Reach Overhead;Lift;Hygiene/Grooming;Transfers;Stand;Stairs;Squat;Bend    Examination-Participation Restrictions Community Activity;Driving;Meal Prep;Shop    Stability/Clinical Decision Making Stable/Uncomplicated    Rehab Potential Good    PT Frequency 3x / week    PT Duration 6 weeks    PT Treatment/Interventions ADLs/Self Care Home Management;Aquatic Therapy;Cryotherapy;Electrical Stimulation;Moist Heat;Traction;Manual techniques;Therapeutic exercise;Therapeutic activities;Functional mobility training;Stair training;Gait training;DME Instruction;Neuromuscular re-education;Patient/family education;Dry needling;Passive range of motion;Joint Manipulations    PT Next Visit Plan Continue with strengthening and general manual to reduce induration/edema. R SLS    PT Home Exercise Plan walking with walker; hip abd isometric, staggered R stance with left leg on step           Patient will benefit from skilled therapeutic intervention in order to improve the following deficits and impairments:  Abnormal gait,Decreased endurance,Decreased skin integrity,Increased edema,Decreased scar mobility,Decreased activity tolerance,Decreased balance,Decreased range of motion,Decreased mobility,Difficulty walking,Pain,Decreased strength  Visit Diagnosis: Muscle weakness (generalized)  Difficulty in walking, not elsewhere classified  Pain in right hip  Chronic  pain of right knee     Problem List Patient Active Problem List   Diagnosis Date Noted  . Leg swelling 03/31/2020  . Venous insufficiency of both lower extremities 03/31/2020  . Vitamin D deficiency 03/06/2020  . Prediabetes 03/06/2020  . Knee pain, right 04/06/2019  . Left ankle sprain 04/06/2019  . Personal history of colonic polyps 07/12/2018  . Ingrown right big toenail 03/23/2017  . Hip pain, chronic, right 02/03/2016  . GERD (gastroesophageal reflux disease) 12/26/2014  . Diverticulosis of colon without hemorrhage 07/05/2014  . FH: pancreatic cancer 01/26/2014  . FH: glaucoma 03/24/2013  . Overweight (BMI 25.0-29.9) 03/13/2012  . Hyperlipidemia LDL goal <100 04/03/2009  . Essential hypertension 04/03/2009   2:45 PM, 06/20/20 Jerene Pitch, DPT Physical Therapy with [facility]  [phone] office  [place] [address] [place], [place], [address] Phone: [phone]   Fax:  [phone]  Name: Ellayna Hilligoss MRN: [id number] Date of Birth: Aug 25, 1945

## 2020-06-23 ENCOUNTER — Ambulatory Visit (HOSPITAL_COMMUNITY): Payer: Medicare Other

## 2020-06-23 ENCOUNTER — Other Ambulatory Visit: Payer: Self-pay

## 2020-06-23 DIAGNOSIS — M25551 Pain in right hip: Secondary | ICD-10-CM | POA: Diagnosis not present

## 2020-06-23 DIAGNOSIS — M6281 Muscle weakness (generalized): Secondary | ICD-10-CM | POA: Diagnosis not present

## 2020-06-23 DIAGNOSIS — M25561 Pain in right knee: Secondary | ICD-10-CM | POA: Diagnosis not present

## 2020-06-23 DIAGNOSIS — R262 Difficulty in walking, not elsewhere classified: Secondary | ICD-10-CM

## 2020-06-23 DIAGNOSIS — G8929 Other chronic pain: Secondary | ICD-10-CM | POA: Diagnosis not present

## 2020-06-23 NOTE — Therapy (Signed)
[place], [place], [address] Phone: [phone]   Fax:  [phone]  Physical Therapy Treatment  [name] MRN: [id number] Date of Birth: [birth year] Referring Provider (PT): Earlie Server, MD   Encounter Date: 06/23/2020   PT End of Session - 06/23/20 1349    Visit Number 10    Number of Visits 18    Date for PT Re-Evaluation 07/14/20    Authorization Type medicare, secondary BCBS no auth    Progress Note Due on Visit 19    PT Start Time 1350    PT Stop Time 1440    PT Time Calculation (min) 50 min    Activity Tolerance Patient tolerated treatment well;Patient limited by fatigue    Behavior During Therapy [facility] for tasks assessed/performed           Past Medical History:  Diagnosis Date  . Allergy   . Arthritis   . Diverticulosis 05/2009  . GERD (gastroesophageal reflux disease)   . Hyperlipidemia   . Hypertension     Past Surgical History:  Procedure Laterality Date  . ABDOMINAL HYSTERECTOMY  1990   for fibroids ovaries remain   . CHOLECYSTECTOMY  1980  . COLONOSCOPY  05/2009   rare diverticula, small internal hemorrhoids. Next colonoscopy in 5-10 yrs.  . COLONOSCOPY N/A 10/23/2018   Procedure: COLONOSCOPY;  Surgeon: Danie Binder, MD;  [facility];  Service: Endoscopy;  Laterality: N/A;  2:00pm  . JOINT REPLACEMENT N/A    Phreesia 06/02/2020  . left nipple inversion bx benign  2000  . POLYPECTOMY  10/23/2018   Procedure: POLYPECTOMY;  Surgeon: Danie Binder, MD;  [facility];  Service: Endoscopy;;  . TOTAL HIP ARTHROPLASTY Right 05/16/2020   Procedure: TOTAL HIP ARTHROPLASTY;  Surgeon: Earlie Server, MD;  [facility];  Service: Orthopedics;  Laterality: Right;    There were no vitals filed for this visit.   Subjective Assessment - 06/23/20 1359    Subjective Pt reports feeling overall improved and has some lingering RLE weakness to  overcome    Patient Stated Goals to be able to walk without a walker and cane    Currently in Pain? Yes    Pain Score 2     Pain Location Hip    Pain Orientation Right    Pain Type Surgical pain    Pain Onset More than a month ago                             [facility] Adult PT Treatment/Exercise - 06/23/20 0001      Ambulation/Gait   Gait velocity - backwards 6x30 ft with CGA for hip extension and forefoot pushoff      Knee/Hip Exercises: Standing   Knee Flexion Strengthening;Both;3 sets;10 reps    Knee Flexion Limitations 5 lbs    Forward Step Up Right;2 sets;10 reps;Step Height: 6"    Other Standing Knee Exercises sidestepping with 5 lbs    Other Standing Knee Exercises LLE lift-offs from 6"      Knee/Hip Exercises: Seated   Long Arc Quad Strengthening;Both;3 sets;10 reps    Long Arc Quad Weight 5 lbs.                  PT Education - 06/23/20 1406    Education Details Pt education on POC details and progress with STG/LTG. Eudcation in  LE edema mgmt and benefits of positioning and self-massage    Person(s) Educated Patient    Methods Explanation    Comprehension Verbalized understanding            PT Short Term Goals - 06/20/20 1401      PT SHORT TERM GOAL #1   Title Patient will report at least 50% improvement in overall symptoms and/or function to demonstrate improved functional mobility    Time 3    Period Weeks    Status Achieved    Target Date 06/23/20      PT SHORT TERM GOAL #2   Title Patient will be able to ambulate at least 226 feet in 2 minutes with least restrictive assistive device to demonstrate improved ambulatory mobilty.    Time 3    Period Weeks    Status Achieved    Target Date 06/23/20      PT SHORT TERM GOAL #3   Title Patient will be independent in self management strategies to improve quality of life and functional outcomes.    Time 3    Period Weeks    Status On-going    Target Date 06/23/20              PT Long Term Goals - 06/20/20 1414      PT LONG TERM GOAL #1   Title Patient will be able to ambulate at least 226 feet in 2 minutes without assistive device to demonstrate improved ambulatory mobilty.    Time 6    Period Weeks    Status On-going      PT LONG TERM GOAL #2   Title Patient will report at least 75% improvement in overall symptoms and/or function to demonstrate improved functional mobility    Time 6    Period Weeks    Status Achieved      PT LONG TERM GOAL #3   Title Patient will demonstrate reduced swelling in right lower extremity with measurments on left and right leg measuring withing 1/2 inche of eachother.    Time 6    Period Weeks    Status On-going      PT LONG TERM GOAL #4   Title Patient will improve on FOTO score to meet predicted outcomes to demonstrate improved functional mobility.    Time 6    Period Weeks    Status On-going      PT LONG TERM GOAL #5   Status On-going                 Plan - 06/23/20 1448    Clinical Impression Statement Progressing w/ POC details and demonstrates rapid onset of RLE fatigue and compensatory strategies as a result requiring cues for mindfulness and adequate repositioning/rest periods.  Continued tx indicated to improve LE function and facilitate normalized ambulation    Personal Factors and Comorbidities Comorbidity 1    Comorbidities osteoporosis    Examination-Activity Limitations Bed Mobility;Dressing;Locomotion Level;Reach Overhead;Lift;Hygiene/Grooming;Transfers;Stand;Stairs;Squat;Bend    Examination-Participation Restrictions Community Activity;Driving;Meal Prep;Shop    Stability/Clinical Decision Making Stable/Uncomplicated    Rehab Potential Good    PT Frequency 3x / week    PT Duration 6 weeks    PT Treatment/Interventions ADLs/Self Care Home Management;Aquatic Therapy;Cryotherapy;Electrical Stimulation;Moist Heat;Traction;Manual techniques;Therapeutic exercise;Therapeutic activities;Functional  mobility training;Stair training;Gait training;DME Instruction;Neuromuscular re-education;Patient/family education;Dry needling;Passive range of motion;Joint Manipulations    PT Next Visit Plan Continue with strengthening and general manual to reduce induration/edema. R SLS    PT Home Exercise Plan walking with  walker; hip abd isometric, staggered R stance with left leg on step           Patient will benefit from skilled therapeutic intervention in order to improve the following deficits and impairments:  Abnormal gait,Decreased endurance,Decreased skin integrity,Increased edema,Decreased scar mobility,Decreased activity tolerance,Decreased balance,Decreased range of motion,Decreased mobility,Difficulty walking,Pain,Decreased strength  Visit Diagnosis: Muscle weakness (generalized)  Difficulty in walking, not elsewhere classified  Pain in right hip     Problem List Patient Active Problem List   Diagnosis Date Noted  . Leg swelling 03/31/2020  . Venous insufficiency of both lower extremities 03/31/2020  . Vitamin D deficiency 03/06/2020  . Prediabetes 03/06/2020  . Knee pain, right 04/06/2019  . Left ankle sprain 04/06/2019  . Personal history of colonic polyps 07/12/2018  . Ingrown right big toenail 03/23/2017  . Hip pain, chronic, right 02/03/2016  . GERD (gastroesophageal reflux disease) 12/26/2014  . Diverticulosis of colon without hemorrhage 07/05/2014  . FH: pancreatic cancer 01/26/2014  . FH: glaucoma 03/24/2013  . Overweight (BMI 25.0-29.9) 03/13/2012  . Hyperlipidemia LDL goal <100 04/03/2009  . Essential hypertension 04/03/2009    2:52 PM, 06/23/20 M. Sherlyn Lees, PT, DPT Physical Therapist- [place] Office Number: [phone]  [place] [address] [place], [place], [address] Phone: [phone]   Fax:  [phone]  Name: Adrienne [name] MRN: [id number] Date of Birth: [birth year]

## 2020-06-24 ENCOUNTER — Ambulatory Visit (HOSPITAL_COMMUNITY): Payer: Medicare Other | Attending: Orthopedic Surgery | Admitting: Physical Therapy

## 2020-06-24 DIAGNOSIS — M25551 Pain in right hip: Secondary | ICD-10-CM | POA: Diagnosis not present

## 2020-06-24 DIAGNOSIS — R262 Difficulty in walking, not elsewhere classified: Secondary | ICD-10-CM | POA: Diagnosis not present

## 2020-06-24 DIAGNOSIS — M6281 Muscle weakness (generalized): Secondary | ICD-10-CM | POA: Insufficient documentation

## 2020-06-24 NOTE — Therapy (Signed)
[place], [place], [address] Phone: [phone]   Fax:  [phone]  Physical Therapy Treatment  [name] MRN: [id number] Date of Birth: 05-07-1945 Referring Provider (PT): Earlie Server, MD   Encounter Date: 06/24/2020   PT End of Session - 06/24/20 1720    Visit Number 11    Number of Visits 18    Date for PT Re-Evaluation 07/14/20    Authorization Type medicare, secondary BCBS no auth    Progress Note Due on Visit 43    PT Start Time 1535    PT Stop Time 1615    PT Time Calculation (min) 40 min    Activity Tolerance Patient tolerated treatment well;Patient limited by fatigue    Behavior During Therapy [facility] for tasks assessed/performed           Past Medical History:  Diagnosis Date  . Allergy   . Arthritis   . Diverticulosis 05/2009  . GERD (gastroesophageal reflux disease)   . Hyperlipidemia   . Hypertension     Past Surgical History:  Procedure Laterality Date  . ABDOMINAL HYSTERECTOMY  1990   for fibroids ovaries remain   . CHOLECYSTECTOMY  1980  . COLONOSCOPY  05/2009   rare diverticula, small internal hemorrhoids. Next colonoscopy in 5-10 yrs.  . COLONOSCOPY N/A 10/23/2018   Procedure: COLONOSCOPY;  Surgeon: Danie Binder, MD;  [facility];  Service: Endoscopy;  Laterality: N/A;  2:00pm  . JOINT REPLACEMENT N/A    Phreesia 06/02/2020  . left nipple inversion bx benign  2000  . POLYPECTOMY  10/23/2018   Procedure: POLYPECTOMY;  Surgeon: Danie Binder, MD;  [facility];  Service: Endoscopy;;  . TOTAL HIP ARTHROPLASTY Right 05/16/2020   Procedure: TOTAL HIP ARTHROPLASTY;  Surgeon: Earlie Server, MD;  [facility];  Service: Orthopedics;  Laterality: Right;    There were no vitals filed for this visit.   Subjective Assessment - 06/24/20 1651    Subjective pt states she feels she is better but that leg is just so weak; no way she  could walk without her cane to assistance.    Currently in Pain? No/denies              [facility] PT Assessment - 06/24/20 0001      Assessment   Medical Diagnosis R THA    Referring Provider (PT) Earlie Server, MD    Onset Date/Surgical Date 05/16/20      Strength   Right Hip Flexion 3-/5    Right Hip Extension 2/5    Right Hip ABduction 3-/5    Left Hip Flexion 4+/5    Left Hip Extension 3-/5    Left Hip ABduction 4+/5                         [facility] Adult PT Treatment/Exercise - 06/24/20 0001      Knee/Hip Exercises: Standing   Forward Step Up Right;2 sets;10 reps;Step Height: 6"    SLS with Vectors 10x 5" BLE with HHA    Other Standing Knee Exercises sidestepping 2RT      Knee/Hip Exercises: Supine   Bridges 15 reps      Knee/Hip Exercises: Sidelying   Hip ABduction 10 reps    Clams 10X5" holds      Knee/Hip Exercises: Prone   Hip Extension Both;10 reps    Hip Extension Limitations with  knee bent (isometric with Rt LE)      Manual Therapy   Manual Therapy Edema management    Manual therapy comments Manual complete separate than rest of tx    Edema Management retro massage and self instruction using pain roller for HEP                  PT Education - 06/24/20 6010    Education Details addition of mat exercises focusing on improving strength of glute and abductor    Person(s) Educated Patient    Methods Explanation;Demonstration;Tactile cues;Verbal cues;Handout    Comprehension Verbalized understanding;Returned demonstration;Verbal cues required;Tactile cues required            PT Short Term Goals - 06/20/20 1401      PT SHORT TERM GOAL #1   Title Patient will report at least 50% improvement in overall symptoms and/or function to demonstrate improved functional mobility    Time 3    Period Weeks    Status Achieved    Target Date 06/23/20      PT SHORT TERM GOAL #2   Title Patient will be able to ambulate at least 226 feet in 2  minutes with least restrictive assistive device to demonstrate improved ambulatory mobilty.    Time 3    Period Weeks    Status Achieved    Target Date 06/23/20      PT SHORT TERM GOAL #3   Title Patient will be independent in self management strategies to improve quality of life and functional outcomes.    Time 3    Period Weeks    Status On-going    Target Date 06/23/20             PT Long Term Goals - 06/20/20 1414      PT LONG TERM GOAL #1   Title Patient will be able to ambulate at least 226 feet in 2 minutes without assistive device to demonstrate improved ambulatory mobilty.    Time 6    Period Weeks    Status On-going      PT LONG TERM GOAL #2   Title Patient will report at least 75% improvement in overall symptoms and/or function to demonstrate improved functional mobility    Time 6    Period Weeks    Status Achieved      PT LONG TERM GOAL #3   Title Patient will demonstrate reduced swelling in right lower extremity with measurments on left and right leg measuring withing 1/2 inche of eachother.    Time 6    Period Weeks    Status On-going      PT LONG TERM GOAL #4   Title Patient will improve on FOTO score to meet predicted outcomes to demonstrate improved functional mobility.    Time 6    Period Weeks    Status On-going      PT LONG TERM GOAL #5   Status On-going                 Plan - 06/24/20 1648    Clinical Impression Statement Pt returns today reporting she did not take any pain meds prior to session as she was not needing them.  Focused on stability activities and general activity tolerance with Lt LE as well.  Added vectors with noted weakness in Rt as compared to Lt LE.  Attempted hip hikes, however unable to complete with Rt due to weakness.  Lt was completed without difficulty.  Pt with extreme weakness in Rt hip mm as tested with MMT, especially extensors.  Updated HEP to focus on these mm this session.    Personal Factors and  Comorbidities Comorbidity 1    Comorbidities osteoporosis    Examination-Activity Limitations Bed Mobility;Dressing;Locomotion Level;Reach Overhead;Lift;Hygiene/Grooming;Transfers;Stand;Stairs;Squat;Bend    Examination-Participation Restrictions Community Activity;Driving;Meal Prep;Shop    Stability/Clinical Decision Making Stable/Uncomplicated    Rehab Potential Good    PT Frequency 3x / week    PT Duration 6 weeks    PT Treatment/Interventions ADLs/Self Care Home Management;Aquatic Therapy;Cryotherapy;Electrical Stimulation;Moist Heat;Traction;Manual techniques;Therapeutic exercise;Therapeutic activities;Functional mobility training;Stair training;Gait training;DME Instruction;Neuromuscular re-education;Patient/family education;Dry needling;Passive range of motion;Joint Manipulations    PT Next Visit Plan Continue with strengthening and general manual to reduce induration/edema. Focus on weak Rt glute and hip abductor.    PT Home Exercise Plan walking with walker; hip abd isometric, staggered R stance with left leg on step  3/1:  sidelying  hip abduction, clams and prone hip extension/heelsqueeze           Patient will benefit from skilled therapeutic intervention in order to improve the following deficits and impairments:  Abnormal gait,Decreased endurance,Decreased skin integrity,Increased edema,Decreased scar mobility,Decreased activity tolerance,Decreased balance,Decreased range of motion,Decreased mobility,Difficulty walking,Pain,Decreased strength  Visit Diagnosis: Muscle weakness (generalized)  Difficulty in walking, not elsewhere classified  Pain in right hip     Problem List Patient Active Problem List   Diagnosis Date Noted  . Leg swelling 03/31/2020  . Venous insufficiency of both lower extremities 03/31/2020  . Vitamin D deficiency 03/06/2020  . Prediabetes 03/06/2020  . Knee pain, right 04/06/2019  . Left ankle sprain 04/06/2019  . Personal history of colonic  polyps 07/12/2018  . Ingrown right big toenail 03/23/2017  . Hip pain, chronic, right 02/03/2016  . GERD (gastroesophageal reflux disease) 12/26/2014  . Diverticulosis of colon without hemorrhage 07/05/2014  . FH: pancreatic cancer 01/26/2014  . FH: glaucoma 03/24/2013  . Overweight (BMI 25.0-29.9) 03/13/2012  . Hyperlipidemia LDL goal <100 04/03/2009  . Essential hypertension 04/03/2009   Teena Irani, PTA/CLT [phone]  Teena Irani 06/24/2020, 5:21 PM  [place] [address] [place], [place], [address] Phone: [phone]   Fax:  [phone]  Name: Tianne Plott MRN: [id number] Date of Birth: 1945-09-27

## 2020-06-24 NOTE — Patient Instructions (Addendum)
Abduction: Side Leg Lift (Eccentric) - Side-Lying    Lie on side. Lift top leg slightly higher than shoulder level. Keep top leg straight with body, toes pointing forward. Slowly lower for 3-5 seconds. _10__  repetitions.   Abduction: Clam (Eccentric) - Side-Lying    Lie on side with knees bent. Lift top knee, keeping feet together. Keep trunk steady. Slowly lower for 3-5 seconds. _10__ repetitions.  Hold_5_ seconds                  HIP / KNEE: Extension - Prone    Squeeze glutes. Raise leg up. Keep knee straight. _10__ reps per set, ___ sets per day, ___ days per week   Copyright  VHI. All rights reserved.

## 2020-06-25 ENCOUNTER — Encounter (HOSPITAL_COMMUNITY): Payer: Self-pay

## 2020-06-25 ENCOUNTER — Ambulatory Visit (HOSPITAL_COMMUNITY): Payer: Medicare Other

## 2020-06-25 ENCOUNTER — Other Ambulatory Visit: Payer: Self-pay

## 2020-06-25 DIAGNOSIS — M6281 Muscle weakness (generalized): Secondary | ICD-10-CM | POA: Diagnosis not present

## 2020-06-25 DIAGNOSIS — R262 Difficulty in walking, not elsewhere classified: Secondary | ICD-10-CM

## 2020-06-25 DIAGNOSIS — M25551 Pain in right hip: Secondary | ICD-10-CM | POA: Diagnosis not present

## 2020-06-25 NOTE — Therapy (Signed)
[place], [place], [address] Phone: [phone]   Fax:  [phone]  Physical Therapy Treatment  [name] MRN: [id number] Date of Birth: 09-13-1945 Referring Provider (PT): Earlie Server, MD   Encounter Date: 06/25/2020   PT End of Session - 06/25/20 1406    Visit Number 12    Number of Visits 18    Date for PT Re-Evaluation 07/14/20    Authorization Type medicare, secondary BCBS no auth    Progress Note Due on Visit 48    PT Start Time 1351    PT Stop Time 1430    PT Time Calculation (min) 39 min    Activity Tolerance Patient tolerated treatment well;Patient limited by fatigue    Behavior During Therapy [facility] for tasks assessed/performed           Past Medical History:  Diagnosis Date  . Allergy   . Arthritis   . Diverticulosis 05/2009  . GERD (gastroesophageal reflux disease)   . Hyperlipidemia   . Hypertension     Past Surgical History:  Procedure Laterality Date  . ABDOMINAL HYSTERECTOMY  1990   for fibroids ovaries remain   . CHOLECYSTECTOMY  1980  . COLONOSCOPY  05/2009   rare diverticula, small internal hemorrhoids. Next colonoscopy in 5-10 yrs.  . COLONOSCOPY N/A 10/23/2018   Procedure: COLONOSCOPY;  Surgeon: Danie Binder, MD;  [facility];  Service: Endoscopy;  Laterality: N/A;  2:00pm  . JOINT REPLACEMENT N/A    Phreesia 06/02/2020  . left nipple inversion bx benign  2000  . POLYPECTOMY  10/23/2018   Procedure: POLYPECTOMY;  Surgeon: Danie Binder, MD;  [facility];  Service: Endoscopy;;  . TOTAL HIP ARTHROPLASTY Right 05/16/2020   Procedure: TOTAL HIP ARTHROPLASTY;  Surgeon: Earlie Server, MD;  [facility];  Service: Orthopedics;  Laterality: Right;    There were no vitals filed for this visit.   Subjective Assessment - 06/25/20 1359    Subjective Patient reports increased right hip soreness from consecutive days of  therapy and attempting sidelying leg lifts    Currently in Pain? Yes    Pain Score 4     Pain Location Hip    Pain Orientation Right    Pain Type Surgical pain              [facility] PT Assessment - 06/25/20 0001      Assessment   Medical Diagnosis R THA    Referring Provider (PT) Earlie Server, MD    Onset Date/Surgical Date 05/16/20                         [facility] Adult PT Treatment/Exercise - 06/25/20 0001      Knee/Hip Exercises: Supine   Hip Adduction Isometric Strengthening;Both   2 min alternating hip add with ball, 2 min abduction against belt   Bridges with Clamshell Strengthening;Both;2 sets;10 reps   gait belt around knees. 2 sets ankle plantarflexed/dorsiflexed   Other Supine Knee/Hip Exercises hip flexion isometric 2x10 with 2 sec hold                  PT Education - 06/25/20 1404    Education Details education on activity modification and scaling exercises    Person(s) Educated Patient    Methods Explanation    Comprehension Verbalized understanding  PT Short Term Goals - 06/20/20 1401      PT SHORT TERM GOAL #1   Title Patient will report at least 50% improvement in overall symptoms and/or function to demonstrate improved functional mobility    Time 3    Period Weeks    Status Achieved    Target Date 06/23/20      PT SHORT TERM GOAL #2   Title Patient will be able to ambulate at least 226 feet in 2 minutes with least restrictive assistive device to demonstrate improved ambulatory mobilty.    Time 3    Period Weeks    Status Achieved    Target Date 06/23/20      PT SHORT TERM GOAL #3   Title Patient will be independent in self management strategies to improve quality of life and functional outcomes.    Time 3    Period Weeks    Status On-going    Target Date 06/23/20             PT Long Term Goals - 06/20/20 1414      PT LONG TERM GOAL #1   Title Patient will be able to ambulate at least 226 feet in 2  minutes without assistive device to demonstrate improved ambulatory mobilty.    Time 6    Period Weeks    Status On-going      PT LONG TERM GOAL #2   Title Patient will report at least 75% improvement in overall symptoms and/or function to demonstrate improved functional mobility    Time 6    Period Weeks    Status Achieved      PT LONG TERM GOAL #3   Title Patient will demonstrate reduced swelling in right lower extremity with measurments on left and right leg measuring withing 1/2 inche of eachother.    Time 6    Period Weeks    Status On-going      PT LONG TERM GOAL #4   Title Patient will improve on FOTO score to meet predicted outcomes to demonstrate improved functional mobility.    Time 6    Period Weeks    Status On-going      PT LONG TERM GOAL #5   Status On-going                 Plan - 06/25/20 1418    Clinical Impression Statement Patient reports increase in right hip soreness and feeling weaker due to increased activity over past 3 days.  Patient tolerated more gentle ther ex activities without adverse effects. Continued tx indicated to improve activity and gait tolerance to decrease need for AD    Personal Factors and Comorbidities Comorbidity 1    Comorbidities osteoporosis    Examination-Activity Limitations Bed Mobility;Dressing;Locomotion Level;Reach Overhead;Lift;Hygiene/Grooming;Transfers;Stand;Stairs;Squat;Bend    Examination-Participation Restrictions Community Activity;Driving;Meal Prep;Shop    Stability/Clinical Decision Making Stable/Uncomplicated    Rehab Potential Good    PT Frequency 3x / week    PT Duration 6 weeks    PT Treatment/Interventions ADLs/Self Care Home Management;Aquatic Therapy;Cryotherapy;Electrical Stimulation;Moist Heat;Traction;Manual techniques;Therapeutic exercise;Therapeutic activities;Functional mobility training;Stair training;Gait training;DME Instruction;Neuromuscular re-education;Patient/family education;Dry  needling;Passive range of motion;Joint Manipulations    PT Next Visit Plan Continue with strengthening and general manual to reduce induration/edema. Focus on weak Rt glute and hip abductor.    PT Home Exercise Plan walking with walker; hip abd isometric, staggered R stance with left leg on step  3/1:  sidelying  hip abduction, clams and prone hip extension/heelsqueeze  Patient will benefit from skilled therapeutic intervention in order to improve the following deficits and impairments:  Abnormal gait,Decreased endurance,Decreased skin integrity,Increased edema,Decreased scar mobility,Decreased activity tolerance,Decreased balance,Decreased range of motion,Decreased mobility,Difficulty walking,Pain,Decreased strength  Visit Diagnosis: Muscle weakness (generalized)  Difficulty in walking, not elsewhere classified  Pain in right hip     Problem List Patient Active Problem List   Diagnosis Date Noted  . Leg swelling 03/31/2020  . Venous insufficiency of both lower extremities 03/31/2020  . Vitamin D deficiency 03/06/2020  . Prediabetes 03/06/2020  . Knee pain, right 04/06/2019  . Left ankle sprain 04/06/2019  . Personal history of colonic polyps 07/12/2018  . Ingrown right big toenail 03/23/2017  . Hip pain, chronic, right 02/03/2016  . GERD (gastroesophageal reflux disease) 12/26/2014  . Diverticulosis of colon without hemorrhage 07/05/2014  . FH: pancreatic cancer 01/26/2014  . FH: glaucoma 03/24/2013  . Overweight (BMI 25.0-29.9) 03/13/2012  . Hyperlipidemia LDL goal <100 04/03/2009  . Essential hypertension 04/03/2009   2:31 PM, 06/25/20 M. Sherlyn Lees, PT, DPT Physical Therapist- [place] Office Number: [phone]  [place] [address] [place], [place], [address] Phone: [phone]   Fax:  [phone]  Name: Adrienne [name] MRN: [id number] Date of Birth: Jan 25, 1946

## 2020-06-26 ENCOUNTER — Other Ambulatory Visit: Payer: Self-pay | Admitting: Family Medicine

## 2020-06-27 ENCOUNTER — Encounter (HOSPITAL_COMMUNITY): Payer: Medicare Other

## 2020-06-30 ENCOUNTER — Encounter (HOSPITAL_COMMUNITY): Payer: Medicare Other

## 2020-06-30 DIAGNOSIS — M1611 Unilateral primary osteoarthritis, right hip: Secondary | ICD-10-CM | POA: Diagnosis not present

## 2020-07-01 ENCOUNTER — Ambulatory Visit (HOSPITAL_COMMUNITY)
Admission: RE | Admit: 2020-07-01 | Discharge: 2020-07-01 | Disposition: A | Discharge status: Home / Self Care | Payer: Medicare Other | Source: Ambulatory Visit | Attending: Orthopedic Surgery | Admitting: Orthopedic Surgery

## 2020-07-01 ENCOUNTER — Ambulatory Visit (HOSPITAL_COMMUNITY): Payer: Medicare Other

## 2020-07-01 ENCOUNTER — Other Ambulatory Visit (HOSPITAL_COMMUNITY): Payer: Self-pay | Admitting: Orthopedic Surgery

## 2020-07-01 ENCOUNTER — Encounter (HOSPITAL_COMMUNITY): Payer: Self-pay

## 2020-07-01 ENCOUNTER — Other Ambulatory Visit: Payer: Self-pay

## 2020-07-01 DIAGNOSIS — M6281 Muscle weakness (generalized): Secondary | ICD-10-CM | POA: Diagnosis not present

## 2020-07-01 DIAGNOSIS — M79604 Pain in right leg: Secondary | ICD-10-CM | POA: Insufficient documentation

## 2020-07-01 DIAGNOSIS — R262 Difficulty in walking, not elsewhere classified: Secondary | ICD-10-CM

## 2020-07-01 DIAGNOSIS — M79605 Pain in left leg: Secondary | ICD-10-CM

## 2020-07-01 DIAGNOSIS — M25551 Pain in right hip: Secondary | ICD-10-CM

## 2020-07-01 NOTE — Therapy (Signed)
[place], [place], [address] Phone: [phone]   Fax:  [phone]  Physical Therapy Treatment  [name] MRN: [id number] Date of Birth: July 05, 1945 Referring Provider (PT): Earlie Server, MD   Encounter Date: 07/01/2020   PT End of Session - 07/01/20 1536    Visit Number 13    Number of Visits 18    Date for PT Re-Evaluation 07/14/20    Authorization Type medicare, secondary BCBS no auth    Progress Note Due on Visit 60    PT Start Time 1527    PT Stop Time 1613    PT Time Calculation (min) 46 min    Activity Tolerance Patient tolerated treatment well;Patient limited by fatigue    Behavior During Therapy [facility] for tasks assessed/performed           Past Medical History:  Diagnosis Date  . Allergy   . Arthritis   . Diverticulosis 05/2009  . GERD (gastroesophageal reflux disease)   . Hyperlipidemia   . Hypertension     Past Surgical History:  Procedure Laterality Date  . ABDOMINAL HYSTERECTOMY  1990   for fibroids ovaries remain   . CHOLECYSTECTOMY  1980  . COLONOSCOPY  05/2009   rare diverticula, small internal hemorrhoids. Next colonoscopy in 5-10 yrs.  . COLONOSCOPY N/A 10/23/2018   Procedure: COLONOSCOPY;  Surgeon: Danie Binder, MD;  [facility];  Service: Endoscopy;  Laterality: N/A;  2:00pm  . JOINT REPLACEMENT N/A    Phreesia 06/02/2020  . left nipple inversion bx benign  2000  . POLYPECTOMY  10/23/2018   Procedure: POLYPECTOMY;  Surgeon: Danie Binder, MD;  [facility];  Service: Endoscopy;;  . TOTAL HIP ARTHROPLASTY Right 05/16/2020   Procedure: TOTAL HIP ARTHROPLASTY;  Surgeon: Earlie Server, MD;  [facility];  Service: Orthopedics;  Laterality: Right;    There were no vitals filed for this visit.   Subjective Assessment - 07/01/20 1530    Subjective Reports she had Duplex ultrasonography earlier today.  Reports some concern  with discoloration in Rt foot.  Reports she feels improvements with ability lift Rt leg and has been ambulating some around the house without AD, stated she limps with increased time.    Pertinent History arthritis    Patient Stated Goals to be able to walk without a walker and cane    Currently in Pain? No/denies                             [facility] Adult PT Treatment/Exercise - 07/01/20 0001      Knee/Hip Exercises: Standing   Hip Flexion 10 reps    Hip Flexion Limitations toe tapping 6instep height alternating    Forward Step Up Right;10 reps;Hand Hold: 1;Step Height: 6"    Step Down Right;10 reps;Hand Hold: 1;Hand Hold: 2;Step Height: 6"    SLS with Vectors 10x 5" BLE with HHA    Gait Training no AD through session    Other Standing Knee Exercises sidestepping 2RT; RTB around thigh      Knee/Hip Exercises: Seated   Sit to Sand 2 sets;5 reps;without UE support   eccentric control, standard chair height     Knee/Hip Exercises: Sidelying   Hip ABduction 10 reps                  PT Education - 07/01/20 1645  Education Details self scar tissue massage instructed    Person(s) Educated Patient    Methods Explanation;Demonstration    Comprehension Verbalized understanding            PT Short Term Goals - 06/20/20 1401      PT SHORT TERM GOAL #1   Title Patient will report at least 50% improvement in overall symptoms and/or function to demonstrate improved functional mobility    Time 3    Period Weeks    Status Achieved    Target Date 06/23/20      PT SHORT TERM GOAL #2   Title Patient will be able to ambulate at least 226 feet in 2 minutes with least restrictive assistive device to demonstrate improved ambulatory mobilty.    Time 3    Period Weeks    Status Achieved    Target Date 06/23/20      PT SHORT TERM GOAL #3   Title Patient will be independent in self management strategies to improve quality of life and functional outcomes.    Time 3     Period Weeks    Status On-going    Target Date 06/23/20             PT Long Term Goals - 06/20/20 1414      PT LONG TERM GOAL #1   Title Patient will be able to ambulate at least 226 feet in 2 minutes without assistive device to demonstrate improved ambulatory mobilty.    Time 6    Period Weeks    Status On-going      PT LONG TERM GOAL #2   Title Patient will report at least 75% improvement in overall symptoms and/or function to demonstrate improved functional mobility    Time 6    Period Weeks    Status Achieved      PT LONG TERM GOAL #3   Title Patient will demonstrate reduced swelling in right lower extremity with measurments on left and right leg measuring withing 1/2 inche of eachother.    Time 6    Period Weeks    Status On-going      PT LONG TERM GOAL #4   Title Patient will improve on FOTO score to meet predicted outcomes to demonstrate improved functional mobility.    Time 6    Period Weeks    Status On-going      PT LONG TERM GOAL #5   Status On-going                 Plan - 07/01/20 1603    Clinical Impression Statement Pt progressing well.  Able to resume closed chain exercises for hip and functional strengthening.  Gait training complete without AD, good mechanics wiht no [place] mechanics noted.  Added resistance with sidestep for glut strengthening.  EOS with manual decognestive technqiues to address edema and scar tissue massage, noted decreased induration following manual.  Instructed self scar tissue massage as additoinal HEP.  No reoprts of pain through session.    Personal Factors and Comorbidities Comorbidity 1    Comorbidities osteoporosis    Examination-Activity Limitations Bed Mobility;Dressing;Locomotion Level;Reach Overhead;Lift;Hygiene/Grooming;Transfers;Stand;Stairs;Squat;Bend    Examination-Participation Restrictions Community Activity;Driving;Meal Prep;Shop    Stability/Clinical Decision Making Stable/Uncomplicated    Clinical  Decision Making Low    Rehab Potential Good    PT Frequency 3x / week    PT Duration 6 weeks    PT Treatment/Interventions ADLs/Self Care Home Management;Aquatic Therapy;Cryotherapy;Electrical Stimulation;Moist Heat;Traction;Manual techniques;Therapeutic exercise;Therapeutic activities;Functional mobility  training;Stair training;Gait training;DME Instruction;Neuromuscular re-education;Patient/family education;Dry needling;Passive range of motion;Joint Manipulations    PT Next Visit Plan Continue with strengthening and general manual to reduce induration/edema. Focus on weak Rt glute and hip abductor.    PT Home Exercise Plan walking with walker; hip abd isometric, staggered R stance with left leg on step  3/1:  sidelying  hip abduction, clams and prone hip extension/heelsqueeze    Consulted and Agree with Plan of Care Patient           Patient will benefit from skilled therapeutic intervention in order to improve the following deficits and impairments:  Abnormal gait,Decreased endurance,Decreased skin integrity,Increased edema,Decreased scar mobility,Decreased activity tolerance,Decreased balance,Decreased range of motion,Decreased mobility,Difficulty walking,Pain,Decreased strength  Visit Diagnosis: Muscle weakness (generalized)  Pain in right hip  Difficulty in walking, not elsewhere classified     Problem List Patient Active Problem List   Diagnosis Date Noted  . Leg swelling 03/31/2020  . Venous insufficiency of both lower extremities 03/31/2020  . Vitamin D deficiency 03/06/2020  . Prediabetes 03/06/2020  . Knee pain, right 04/06/2019  . Left ankle sprain 04/06/2019  . Personal history of colonic polyps 07/12/2018  . Ingrown right big toenail 03/23/2017  . Hip pain, chronic, right 02/03/2016  . GERD (gastroesophageal reflux disease) 12/26/2014  . Diverticulosis of colon without hemorrhage 07/05/2014  . FH: pancreatic cancer 01/26/2014  . FH: glaucoma 03/24/2013  .  Overweight (BMI 25.0-29.9) 03/13/2012  . Hyperlipidemia LDL goal <100 04/03/2009  . Essential hypertension 04/03/2009   Ihor Austin, LPTA/CLT; CBIS [phone]  Aldona Lento 07/01/2020, 4:48 PM  [facility] [address] [place], [place], [address] Phone: [phone]   Fax:  [phone]  Name: Adrienne [name] MRN: [id number] Date of Birth: 1946-03-22

## 2020-07-02 ENCOUNTER — Encounter (HOSPITAL_COMMUNITY): Payer: Self-pay

## 2020-07-02 ENCOUNTER — Ambulatory Visit (HOSPITAL_COMMUNITY): Payer: Medicare Other

## 2020-07-02 DIAGNOSIS — M6281 Muscle weakness (generalized): Secondary | ICD-10-CM

## 2020-07-02 DIAGNOSIS — R262 Difficulty in walking, not elsewhere classified: Secondary | ICD-10-CM | POA: Diagnosis not present

## 2020-07-02 DIAGNOSIS — M25551 Pain in right hip: Secondary | ICD-10-CM

## 2020-07-02 NOTE — Therapy (Signed)
[place], [place], [address] Phone: [phone]   Fax:  [phone]  Physical Therapy Treatment  [name] MRN: [id number] Date of Birth: 08/19/1945 Referring Provider (PT): Earlie Server, MD   Encounter Date: 07/02/2020   PT End of Session - 07/02/20 1400    Visit Number 14    Number of Visits 18    Date for PT Re-Evaluation 07/14/20    Authorization Type medicare, secondary BCBS no auth    Progress Note Due on Visit 19    PT Start Time 1347    PT Stop Time 1430    PT Time Calculation (min) 43 min    Activity Tolerance Patient tolerated treatment well;Patient limited by fatigue    Behavior During Therapy [facility] for tasks assessed/performed           Past Medical History:  Diagnosis Date  . Allergy   . Arthritis   . Diverticulosis 05/2009  . GERD (gastroesophageal reflux disease)   . Hyperlipidemia   . Hypertension     Past Surgical History:  Procedure Laterality Date  . ABDOMINAL HYSTERECTOMY  1990   for fibroids ovaries remain   . CHOLECYSTECTOMY  1980  . COLONOSCOPY  05/2009   rare diverticula, small internal hemorrhoids. Next colonoscopy in 5-10 yrs.  . COLONOSCOPY N/A 10/23/2018   Procedure: COLONOSCOPY;  Surgeon: Danie Binder, MD;  [facility];  Service: Endoscopy;  Laterality: N/A;  2:00pm  . JOINT REPLACEMENT N/A    Phreesia 06/02/2020  . left nipple inversion bx benign  2000  . POLYPECTOMY  10/23/2018   Procedure: POLYPECTOMY;  Surgeon: Danie Binder, MD;  [facility];  Service: Endoscopy;;  . TOTAL HIP ARTHROPLASTY Right 05/16/2020   Procedure: TOTAL HIP ARTHROPLASTY;  Surgeon: Earlie Server, MD;  [facility];  Service: Orthopedics;  Laterality: Right;    There were no vitals filed for this visit.   Subjective Assessment - 07/02/20 1354    Subjective Reports feeling better and her swelling has somewhat subsided.  No  bloodclots present and able to tolerated activity as indicated    Pertinent History arthritis    Patient Stated Goals to be able to walk without a walker and cane              [facility] PT Assessment - 07/02/20 0001      Assessment   Medical Diagnosis R THA    Referring Provider (PT) Earlie Server, MD    Onset Date/Surgical Date 05/16/20                         [facility] Adult PT Treatment/Exercise - 07/02/20 0001      Knee/Hip Exercises: Standing   Knee Flexion Strengthening;Both;10 reps;4 sets    Knee Flexion Limitations 5 lbs    Hip Flexion Stengthening    Hip Flexion Limitations stair taps x 2 min with 5 lbs    Other Standing Knee Exercises sidestepping x 2 min 5 lbs ankle weights    Other Standing Knee Exercises mini-squat withstanding LE and then through UE 6x15 sec      Knee/Hip Exercises: Seated   Long Arc Quad Strengthening;Both;3 sets;10 reps    Long Arc Quad Weight 5 lbs.    Ball Squeeze 3x10    Other Seated Knee/Hip Exercises ankle DF/PF 30x      Knee/Hip Exercises: Supine   Hip Adduction Isometric --  PT Short Term Goals - 06/20/20 1401      PT SHORT TERM GOAL #1   Title Patient will report at least 50% improvement in overall symptoms and/or function to demonstrate improved functional mobility    Time 3    Period Weeks    Status Achieved    Target Date 06/23/20      PT SHORT TERM GOAL #2   Title Patient will be able to ambulate at least 226 feet in 2 minutes with least restrictive assistive device to demonstrate improved ambulatory mobilty.    Time 3    Period Weeks    Status Achieved    Target Date 06/23/20      PT SHORT TERM GOAL #3   Title Patient will be independent in self management strategies to improve quality of life and functional outcomes.    Time 3    Period Weeks    Status On-going    Target Date 06/23/20             PT Long Term Goals - 06/20/20 1414      PT LONG TERM GOAL #1   Title  Patient will be able to ambulate at least 226 feet in 2 minutes without assistive device to demonstrate improved ambulatory mobilty.    Time 6    Period Weeks    Status On-going      PT LONG TERM GOAL #2   Title Patient will report at least 75% improvement in overall symptoms and/or function to demonstrate improved functional mobility    Time 6    Period Weeks    Status Achieved      PT LONG TERM GOAL #3   Title Patient will demonstrate reduced swelling in right lower extremity with measurments on left and right leg measuring withing 1/2 inche of eachother.    Time 6    Period Weeks    Status On-going      PT LONG TERM GOAL #4   Title Patient will improve on FOTO score to meet predicted outcomes to demonstrate improved functional mobility.    Time 6    Period Weeks    Status On-going      PT LONG TERM GOAL #5   Status On-going                 Plan - 07/02/20 1437    Clinical Impression Statement Demonstrates right external rotation and abduction weakness as evidenced by LE dropping into internal rotation/adduction in swing phase.  Performed standing hip flexion exercises with external rotation bias.  Continued w/ POC to improve hip strength to minimize use of AD    Personal Factors and Comorbidities Comorbidity 1    Comorbidities osteoporosis    Examination-Activity Limitations Bed Mobility;Dressing;Locomotion Level;Reach Overhead;Lift;Hygiene/Grooming;Transfers;Stand;Stairs;Squat;Bend    Examination-Participation Restrictions Community Activity;Driving;Meal Prep;Shop    Stability/Clinical Decision Making Stable/Uncomplicated    Rehab Potential Good    PT Frequency 3x / week    PT Duration 6 weeks    PT Treatment/Interventions ADLs/Self Care Home Management;Aquatic Therapy;Cryotherapy;Electrical Stimulation;Moist Heat;Traction;Manual techniques;Therapeutic exercise;Therapeutic activities;Functional mobility training;Stair training;Gait training;DME  Instruction;Neuromuscular re-education;Patient/family education;Dry needling;Passive range of motion;Joint Manipulations    PT Next Visit Plan Continue with strengthening and general manual to reduce induration/edema. Focus on weak Rt glute and hip abductor.    PT Home Exercise Plan walking with walker; hip abd isometric, staggered R stance with left leg on step  3/1:  sidelying  hip abduction, clams and prone hip extension/heelsqueeze  Consulted and Agree with Plan of Care Patient           Patient will benefit from skilled therapeutic intervention in order to improve the following deficits and impairments:  Abnormal gait,Decreased endurance,Decreased skin integrity,Increased edema,Decreased scar mobility,Decreased activity tolerance,Decreased balance,Decreased range of motion,Decreased mobility,Difficulty walking,Pain,Decreased strength  Visit Diagnosis: Muscle weakness (generalized)  Difficulty in walking, not elsewhere classified  Pain in right hip     Problem List Patient Active Problem List   Diagnosis Date Noted  . Leg swelling 03/31/2020  . Venous insufficiency of both lower extremities 03/31/2020  . Vitamin D deficiency 03/06/2020  . Prediabetes 03/06/2020  . Knee pain, right 04/06/2019  . Left ankle sprain 04/06/2019  . Personal history of colonic polyps 07/12/2018  . Ingrown right big toenail 03/23/2017  . Hip pain, chronic, right 02/03/2016  . GERD (gastroesophageal reflux disease) 12/26/2014  . Diverticulosis of colon without hemorrhage 07/05/2014  . FH: pancreatic cancer 01/26/2014  . FH: glaucoma 03/24/2013  . Overweight (BMI 25.0-29.9) 03/13/2012  . Hyperlipidemia LDL goal <100 04/03/2009  . Essential hypertension 04/03/2009   2:41 PM, 07/02/20 M. Sherlyn Lees, PT, DPT Physical Therapist- [place] Office Number: [phone]  [facility] [address] [place], [place], [address] Phone: [phone]   Fax:   [phone]  Name: Adrienne [name] MRN: [id number] Date of Birth: [birth year]

## 2020-07-04 ENCOUNTER — Encounter (HOSPITAL_COMMUNITY): Payer: Self-pay | Admitting: Physical Therapy

## 2020-07-04 ENCOUNTER — Other Ambulatory Visit: Payer: Self-pay

## 2020-07-04 ENCOUNTER — Ambulatory Visit (HOSPITAL_COMMUNITY): Payer: Medicare Other | Admitting: Physical Therapy

## 2020-07-04 DIAGNOSIS — R262 Difficulty in walking, not elsewhere classified: Secondary | ICD-10-CM | POA: Diagnosis not present

## 2020-07-04 DIAGNOSIS — M6281 Muscle weakness (generalized): Secondary | ICD-10-CM

## 2020-07-04 DIAGNOSIS — M25551 Pain in right hip: Secondary | ICD-10-CM | POA: Diagnosis not present

## 2020-07-04 NOTE — Therapy (Signed)
[facility] [address] [place], [place], [address] Phone: [phone]   Fax:  [phone]  Physical Therapy Treatment and Discharge Note  [name] MRN: [id number] Date of Birth: [birth year] Referring Provider (PT): Earlie Server, MD   PHYSICAL THERAPY DISCHARGE SUMMARY  Visits from Start of Care: 15  Current functional level related to goals / functional outcomes: See below   Remaining deficits: See below   Education / Equipment: See below Plan: Patient agrees to discharge.  Patient goals were met. Patient is being discharged due to meeting the stated rehab goals.  ?????             Encounter Date: 07/04/2020   PT End of Session - 07/04/20 1413    Visit Number 15    Number of Visits 18    Date for PT Re-Evaluation 07/14/20    Authorization Type medicare, secondary BCBS no auth    Progress Note Due on Visit 29    PT Start Time 1405   5 minutes late to session   PT Stop Time 1443    PT Time Calculation (min) 38 min    Activity Tolerance Patient tolerated treatment well;Patient limited by fatigue    Behavior During Therapy [facility] for tasks assessed/performed           Past Medical History:  Diagnosis Date  . Allergy   . Arthritis   . Diverticulosis 05/2009  . GERD (gastroesophageal reflux disease)   . Hyperlipidemia   . Hypertension     Past Surgical History:  Procedure Laterality Date  . ABDOMINAL HYSTERECTOMY  1990   for fibroids ovaries remain   . CHOLECYSTECTOMY  1980  . COLONOSCOPY  05/2009   rare diverticula, small internal hemorrhoids. Next colonoscopy in 5-10 yrs.  . COLONOSCOPY N/A 10/23/2018   Procedure: COLONOSCOPY;  Surgeon: Danie Binder, MD;  [facility];  Service: Endoscopy;  Laterality: N/A;  2:00pm  . JOINT REPLACEMENT N/A    Phreesia 06/02/2020  . left nipple inversion bx benign  2000  . POLYPECTOMY  10/23/2018   Procedure: POLYPECTOMY;   Surgeon: Danie Binder, MD;  [facility];  Service: Endoscopy;;  . TOTAL HIP ARTHROPLASTY Right 05/16/2020   Procedure: TOTAL HIP ARTHROPLASTY;  Surgeon: Earlie Server, MD;  [facility];  Service: Orthopedics;  Laterality: Right;    There were no vitals filed for this visit.   Subjective Assessment - 07/04/20 1411    Subjective States she is having some back pain and that she took a pain pill prior to coming. States that her life is getting busy. States that it has been hard doing her exercises when she is busy. States that she has no actual difficulties with her exercises.    Pertinent History arthritis    Currently in Pain? Yes    Pain Score 4     Pain Location Back              [facility] PT Assessment - 07/04/20 0001      Assessment   Medical Diagnosis R THA    Referring Provider (PT) Earlie Server, MD    Onset Date/Surgical Date 05/16/20      Circumferential Edema   Circumferential - Right knee  15.2, ankle 8.5 inches    Circumferential - Left  knee 15.0, ankle 8.25 inches      Ambulation/Gait   Ambulation/Gait Yes    Ambulation/Gait Assistance 6: Modified independent (  Device/Increase time)    Ambulation Distance (Feet) 226 Feet    Assistive device None    Gait Comments 1:15                                 PT Education - 07/04/20 1446    Education Details on FOTO score, on progress made, on continued progress to make, on benefits of continued strengthening and flexability, about slowly progressing ROM now that precautions are lifted.    Person(s) Educated Patient    Methods Explanation    Comprehension Verbalized understanding            PT Short Term Goals - 07/04/20 1416      PT SHORT TERM GOAL #1   Title Patient will report at least 50% improvement in overall symptoms and/or function to demonstrate improved functional mobility    Time 3    Period Weeks    Status Achieved    Target Date 06/23/20      PT SHORT TERM  GOAL #2   Title Patient will be able to ambulate at least 226 feet in 2 minutes with least restrictive assistive device to demonstrate improved ambulatory mobilty.    Time 3    Period Weeks    Status Achieved    Target Date 06/23/20      PT SHORT TERM GOAL #3   Title Patient will be independent in self management strategies to improve quality of life and functional outcomes.    Time 3    Period Weeks    Status Achieved    Target Date 06/23/20             PT Long Term Goals - 07/04/20 1416      PT LONG TERM GOAL #1   Title Patient will be able to ambulate at least 226 feet in 2 minutes without assistive device to demonstrate improved ambulatory mobilty.    Time 6    Period Weeks    Status Achieved      PT LONG TERM GOAL #2   Title Patient will report at least 75% improvement in overall symptoms and/or function to demonstrate improved functional mobility    Time 6    Period Weeks    Status Achieved      PT LONG TERM GOAL #3   Title Patient will demonstrate reduced swelling in right lower extremity with measurments on left and right leg measuring withing 1/2 inche of eachother.    Time 6    Period Weeks    Status Achieved      PT LONG TERM GOAL #4   Title Patient will improve on FOTO score to meet predicted outcomes to demonstrate improved functional mobility.    Time 6    Period Weeks    Status On-going      PT LONG TERM GOAL #5   Status On-going                 Plan - 07/04/20 1501    Clinical Impression Statement Overall patient progressing well towards goals and has met all goals with the exception of her FOTO goal. Educated patient on transition into more mobility and turning function tasks into exercises by breaking down the task at hand. Answered all questions prior to end of session. Patient to discharge from PT to HEP at this time secondary to progress made.    Personal Factors and Comorbidities Comorbidity 1  Comorbidities osteoporosis     Examination-Activity Limitations Bed Mobility;Dressing;Locomotion Level;Reach Overhead;Lift;Hygiene/Grooming;Transfers;Stand;Stairs;Squat;Bend    Examination-Participation Restrictions Community Activity;Driving;Meal Prep;Shop    Stability/Clinical Decision Making Stable/Uncomplicated    Rehab Potential Good    PT Frequency 3x / week    PT Duration 6 weeks    PT Treatment/Interventions ADLs/Self Care Home Management;Aquatic Therapy;Cryotherapy;Electrical Stimulation;Moist Heat;Traction;Manual techniques;Therapeutic exercise;Therapeutic activities;Functional mobility training;Stair training;Gait training;DME Instruction;Neuromuscular re-education;Patient/family education;Dry needling;Passive range of motion;Joint Manipulations    PT Next Visit Plan DC to HEP    PT Home Exercise Plan walking with walker; hip abd isometric, staggered R stance with left leg on step  3/1:  sidelying  hip abduction, clams and prone hip extension/heelsqueeze    Consulted and Agree with Plan of Care Patient           Patient will benefit from skilled therapeutic intervention in order to improve the following deficits and impairments:  Abnormal gait,Decreased endurance,Decreased skin integrity,Increased edema,Decreased scar mobility,Decreased activity tolerance,Decreased balance,Decreased range of motion,Decreased mobility,Difficulty walking,Pain,Decreased strength  Visit Diagnosis: Muscle weakness (generalized)  Difficulty in walking, not elsewhere classified  Pain in right hip     Problem List Patient Active Problem List   Diagnosis Date Noted  . Leg swelling 03/31/2020  . Venous insufficiency of both lower extremities 03/31/2020  . Vitamin D deficiency 03/06/2020  . Prediabetes 03/06/2020  . Knee pain, right 04/06/2019  . Left ankle sprain 04/06/2019  . Personal history of colonic polyps 07/12/2018  . Ingrown right big toenail 03/23/2017  . Hip pain, chronic, right 02/03/2016  . GERD  (gastroesophageal reflux disease) 12/26/2014  . Diverticulosis of colon without hemorrhage 07/05/2014  . FH: pancreatic cancer 01/26/2014  . FH: glaucoma 03/24/2013  . Overweight (BMI 25.0-29.9) 03/13/2012  . Hyperlipidemia LDL goal <100 04/03/2009  . Essential hypertension 04/03/2009   3:03 PM, 07/04/20 Jerene Pitch, DPT Physical Therapy with [facility]  [phone] office   [place] [address] [place], [place], [address] Phone: [phone]   Fax:  [phone]  Name: Adrienne [name] MRN: [id number] Date of Birth: Dec 18, 1945

## 2020-07-30 ENCOUNTER — Other Ambulatory Visit: Payer: Self-pay

## 2020-07-30 ENCOUNTER — Ambulatory Visit (HOSPITAL_COMMUNITY)
Admission: RE | Admit: 2020-07-30 | Discharge: 2020-07-30 | Disposition: A | Discharge status: Home / Self Care | Payer: Medicare Other | Source: Ambulatory Visit | Attending: Family Medicine | Admitting: Family Medicine

## 2020-07-30 DIAGNOSIS — Z1231 Encounter for screening mammogram for malignant neoplasm of breast: Secondary | ICD-10-CM | POA: Insufficient documentation

## 2020-08-12 DIAGNOSIS — H40013 Open angle with borderline findings, low risk, bilateral: Secondary | ICD-10-CM | POA: Diagnosis not present

## 2020-08-18 DIAGNOSIS — M1611 Unilateral primary osteoarthritis, right hip: Secondary | ICD-10-CM | POA: Diagnosis not present

## 2020-08-20 ENCOUNTER — Ambulatory Visit: Payer: Medicare Other

## 2020-08-26 ENCOUNTER — Other Ambulatory Visit: Payer: Self-pay

## 2020-08-26 ENCOUNTER — Ambulatory Visit (INDEPENDENT_AMBULATORY_CARE_PROVIDER_SITE_OTHER): Payer: Medicare Other

## 2020-08-26 VITALS — Ht 65.58 in | Wt 162.0 lb

## 2020-08-26 DIAGNOSIS — Z0000 Encounter for general adult medical examination without abnormal findings: Secondary | ICD-10-CM

## 2020-08-26 NOTE — Patient Instructions (Signed)
Adrienne [name] , Thank you for taking time to come for your Medicare Wellness Visit. I appreciate your ongoing commitment to your health goals. Please review the following plan we discussed and let me know if I can assist you in the future.   Screening recommendations/referrals: Colonoscopy: Up to date- Due in 2025 Mammogram: Up to date- Due 07/2021 Bone Density: up to date  Recommended yearly ophthalmology/optometry visit for glaucoma screening and checkup Recommended yearly dental visit for hygiene and checkup  Vaccinations: Influenza vaccine: up to date- Due Sept 2022 Pneumococcal vaccine: up to date  Tdap vaccine: not covered as a preventative vaccine  Shingles vaccine: May get at the pharmacy. Information enclosed        Next appointment: wellness May 4th 2023 or after    Preventive Care 65 Years and Older, Female Preventive care refers to lifestyle choices and visits with your health care provider that can promote health and wellness. What does preventive care include?  A yearly physical exam. This is also called an annual well check.  Dental exams once or twice a year.  Routine eye exams. Ask your health care provider how often you should have your eyes checked.  Personal lifestyle choices, including:  Daily care of your teeth and gums.  Regular physical activity.  Eating a healthy diet.  Avoiding tobacco and drug use.  Limiting alcohol use.  Practicing safe sex.  Taking low-dose aspirin every day.  Taking vitamin and mineral supplements as recommended by your health care provider. What happens during an annual well check? The services and screenings done by your health care provider during your annual well check will depend on your age, overall health, lifestyle risk factors, and family history of disease. Counseling  Your health care provider may ask you questions about your:  Alcohol use.  Tobacco use.  Drug use.  Emotional well-being.  Home and  relationship well-being.  Sexual activity.  Eating habits.  History of falls.  Memory and ability to understand (cognition).  Work and work Statistician.  Reproductive health. Screening  You may have the following tests or measurements:  Height, weight, and BMI.  Blood pressure.  Lipid and cholesterol levels. These may be checked every 5 years, or more frequently if you are over 72 years old.  Skin check.  Lung cancer screening. You may have this screening every year starting at age 19 if you have a 30-pack-year history of smoking and currently smoke or have quit within the past 15 years.  Fecal occult blood test (FOBT) of the stool. You may have this test every year starting at age 3.  Flexible sigmoidoscopy or colonoscopy. You may have a sigmoidoscopy every 5 years or a colonoscopy every 10 years starting at age 9.  Hepatitis C blood test.  Hepatitis B blood test.  Sexually transmitted disease (STD) testing.  Diabetes screening. This is done by checking your blood sugar (glucose) after you have not eaten for a while (fasting). You may have this done every 1-3 years.  Bone density scan. This is done to screen for osteoporosis. You may have this done starting at age 44.  Mammogram. This may be done every 1-2 years. Talk to your health care provider about how often you should have regular mammograms. Talk with your health care provider about your test results, treatment options, and if necessary, the need for more tests. Vaccines  Your health care provider may recommend certain vaccines, such as:  Influenza vaccine. This is recommended every year.  Tetanus, diphtheria, and acellular pertussis (Tdap, Td) vaccine. You may need a Td booster every 10 years.  Zoster vaccine. You may need this after age 54.  Pneumococcal 13-valent conjugate (PCV13) vaccine. One dose is recommended after age 36.  Pneumococcal polysaccharide (PPSV23) vaccine. One dose is recommended after  age 55. Talk to your health care provider about which screenings and vaccines you need and how often you need them. This information is not intended to replace advice given to you by your health care provider. Make sure you discuss any questions you have with your health care provider. Document Released: 05/09/2015 Document Revised: 12/31/2015 Document Reviewed: 02/11/2015 Elsevier Interactive Patient Education  2017 [place] Prevention in the Home Falls can cause injuries. They can happen to people of all ages. There are many things you can do to make your home safe and to help prevent falls. What can I do on the outside of my home?  Regularly fix the edges of walkways and driveways and fix any cracks.  Remove anything that might make you trip as you walk through a door, such as a raised step or threshold.  Trim any bushes or trees on the path to your home.  Use bright outdoor lighting.  Clear any walking paths of anything that might make someone trip, such as rocks or tools.  Regularly check to see if handrails are loose or broken. Make sure that both sides of any steps have handrails.  Any raised decks and porches should have guardrails on the edges.  Have any leaves, snow, or ice cleared regularly.  Use sand or salt on walking paths during winter.  Clean up any spills in your garage right away. This includes oil or grease spills. What can I do in the bathroom?  Use night lights.  Install grab bars by the toilet and in the tub and shower. Do not use towel bars as grab bars.  Use non-skid mats or decals in the tub or shower.  If you need to sit down in the shower, use a plastic, non-slip stool.  Keep the floor dry. Clean up any water that spills on the floor as soon as it happens.  Remove soap buildup in the tub or shower regularly.  Attach bath mats securely with double-sided non-slip rug tape.  Do not have throw rugs and other things on the floor that can  make you trip. What can I do in the bedroom?  Use night lights.  Make sure that you have a light by your bed that is easy to reach.  Do not use any sheets or blankets that are too big for your bed. They should not hang down onto the floor.  Have a firm chair that has side arms. You can use this for support while you get dressed.  Do not have throw rugs and other things on the floor that can make you trip. What can I do in the kitchen?  Clean up any spills right away.  Avoid walking on wet floors.  Keep items that you use a lot in easy-to-reach places.  If you need to reach something above you, use a strong step stool that has a grab bar.  Keep electrical cords out of the way.  Do not use floor polish or wax that makes floors slippery. If you must use wax, use non-skid floor wax.  Do not have throw rugs and other things on the floor that can make you trip. What can I do  with my stairs?  Do not leave any items on the stairs.  Make sure that there are handrails on both sides of the stairs and use them. Fix handrails that are broken or loose. Make sure that handrails are as long as the stairways.  Check any carpeting to make sure that it is firmly attached to the stairs. Fix any carpet that is loose or worn.  Avoid having throw rugs at the top or bottom of the stairs. If you do have throw rugs, attach them to the floor with carpet tape.  Make sure that you have a light switch at the top of the stairs and the bottom of the stairs. If you do not have them, ask someone to add them for you. What else can I do to help prevent falls?  Wear shoes that:  Do not have high heels.  Have rubber bottoms.  Are comfortable and fit you well.  Are closed at the toe. Do not wear sandals.  If you use a stepladder:  Make sure that it is fully opened. Do not climb a closed stepladder.  Make sure that both sides of the stepladder are locked into place.  Ask someone to hold it for you,  if possible.  Clearly mark and make sure that you can see:  Any grab bars or handrails.  First and last steps.  Where the edge of each step is.  Use tools that help you move around (mobility aids) if they are needed. These include:  Canes.  Walkers.  Scooters.  Crutches.  Turn on the lights when you go into a dark area. Replace any light bulbs as soon as they burn out.  Set up your furniture so you have a clear path. Avoid moving your furniture around.  If any of your floors are uneven, fix them.  If there are any pets around you, be aware of where they are.  Review your medicines with your doctor. Some medicines can make you feel dizzy. This can increase your chance of falling. Ask your doctor what other things that you can do to help prevent falls. This information is not intended to replace advice given to you by your health care provider. Make sure you discuss any questions you have with your health care provider. Document Released: 02/06/2009 Document Revised: 09/18/2015 Document Reviewed: 05/17/2014 Elsevier Interactive Patient Education  2017 [employer].

## 2020-08-26 NOTE — Progress Notes (Signed)
Subjective:   Adrienne [name] is a 75 y.o. female who presents for Medicare Annual (Subsequent) preventive examination.  Review of Systems     Cardiac Risk Factors include: advanced age (>58men, >49 women);dyslipidemia;hypertension     Objective:    Today's Vitals   08/26/20 1116 08/26/20 1117  Weight: 162 lb (73.5 kg)   Height: 5' 5.58" (1.666 m)   PainSc: 0-No pain 0-No pain   Body mass index is 26.48 kg/m.  Advanced Directives 08/26/2020 06/02/2020 05/16/2020 05/07/2020 08/20/2019 05/14/2019 10/23/2018  Does Patient Have a Medical Advance Directive? No No No No No No No  Would patient like information on creating a medical advance directive? Yes (ED - Information included in AVS) No - Patient declined No - Patient declined No - Patient declined No - Patient declined No - Patient declined No - Patient declined    Current Medications (verified) Outpatient Encounter Medications as of 08/26/2020  Medication Sig  . acetaminophen (TYLENOL) 650 MG CR tablet Take 2 tablets (1,300 mg total) by mouth daily as needed for pain.  Marland Kitchen amLODipine (NORVASC) 2.5 MG tablet Take 1 tablet (2.5 mg total) by mouth daily.  . Biotin 10 MG CAPS Take 10 mg by mouth every evening.  Maudry Mayhew, Angelica sinensis, (DONG QUAI PO) Take 1 tablet by mouth daily.   Marland Kitchen gabapentin (NEURONTIN) 100 MG capsule Take one to two capsules by mouth , at bedtime for pain  . Lidocaine-Menthol (ICY HOT LIDOCAINE PLUS MENTHOL EX) Apply 1 application topically 4 (four) times daily as needed (pain.). Icy Hot Cream with Lidocaine plus Menthol Cream  . Liniments (BLUE-EMU SUPER STRENGTH EX) Apply 1 application topically 4 (four) times daily as needed (pain.).  Marland Kitchen Liniments (PAIN RELIEF EX) Apply 1 application topically 4 (four) times daily as needed (pain.). Triderma Pain Relief Cream  . loratadine (CLARITIN) 10 MG tablet Take 10 mg by mouth daily as needed for allergies.  . Menthol, Topical Analgesic, (BIOFREEZE EX) Apply 1 application  topically 4 (four) times daily as needed (pain.).  Marland Kitchen Menthol, Topical Analgesic, (ICE BLUE EX) Apply 1 application topically 4 (four) times daily as needed (pain.).  Marland Kitchen Menthol-Methyl Salicylate (MUSCLE RUB) 10-15 % CREA Apply 1 application topically as needed for muscle pain.  . Multiple Minerals-Vitamins (CAL MAG ZINC +D3 PO) Take 1 tablet by mouth every evening.  . Multiple Vitamin (MULTIVITAMIN WITH MINERALS) TABS tablet Take 1 tablet by mouth every evening. Women's Multivitamin 50+  . oxyCODONE (OXY IR/ROXICODONE) 5 MG immediate release tablet Take one tab po q4-6hrs prn pain, may need 1-2 first couple weeks, max 8 tabs in 24hrs  . triamterene-hydrochlorothiazide (MAXZIDE-25) 37.5-25 MG tablet Take 1 tablet by mouth daily.  . TURMERIC PO Take 450 mg by mouth every evening.  . [DISCONTINUED] amLODipine (NORVASC) 2.5 MG tablet TAKE 1 TABLET EVERY DAY  . [DISCONTINUED] docusate sodium (COLACE) 100 MG capsule Take 1 capsule (100 mg total) by mouth daily as needed.  . [DISCONTINUED] triamterene-hydrochlorothiazide (MAXZIDE-25) 37.5-25 MG tablet Take 1 tablet by mouth daily.   No facility-administered encounter medications on file as of 08/26/2020.    Allergies (verified) Valsartan and Ace inhibitors   History: Past Medical History:  Diagnosis Date  . Allergy   . Arthritis   . Diverticulosis 05/2009  . GERD (gastroesophageal reflux disease)   . Hyperlipidemia   . Hypertension    Past Surgical History:  Procedure Laterality Date  . ABDOMINAL HYSTERECTOMY  1990   for fibroids ovaries remain   .  CHOLECYSTECTOMY  1980  . COLONOSCOPY  05/2009   rare diverticula, small internal hemorrhoids. Next colonoscopy in 5-10 yrs.  . COLONOSCOPY N/A 10/23/2018   Procedure: COLONOSCOPY;  Surgeon: Danie Binder, MD;  Location: [facility];  Service: Endoscopy;  Laterality: N/A;  2:00pm  . JOINT REPLACEMENT N/A    Phreesia 06/02/2020  . left nipple inversion bx benign  2000  . POLYPECTOMY   10/23/2018   Procedure: POLYPECTOMY;  Surgeon: Danie Binder, MD;  Location: [facility];  Service: Endoscopy;;  . TOTAL HIP ARTHROPLASTY Right 05/16/2020   Procedure: TOTAL HIP ARTHROPLASTY;  Surgeon: Earlie Server, MD;  Location: [facility];  Service: Orthopedics;  Laterality: Right;   Family History  Problem Relation Age of Onset  . Kidney failure Father   . Arthritis Father   . Heart disease Sister   . Hypertension Sister   . Arthritis Sister   . Breast cancer Sister   . Diabetes Sister   . Arthritis Sister   . Lung cancer Brother        was a smoker  . Arthritis Brother   . Arthritis Mother   . Prostate cancer Brother   . Colon cancer Neg Hx    Social History   Socioeconomic History  . Marital status: Single    Spouse name: Not on file  . Number of children: 0  . Years of education: Not on file  . Highest education level: Not on file  Occupational History  . Occupation: retired    Fish farm manager: RETIRED  Tobacco Use  . Smoking status: Never Smoker  . Smokeless tobacco: Never Used  Vaping Use  . Vaping Use: Never used  Substance and Sexual Activity  . Alcohol use: Not Currently    Comment: rarely   . Drug use: No  . Sexual activity: Not Currently    Birth control/protection: Surgical    Comment: not asked  Other Topics Concern  . Not on file  Social History Narrative  . Not on file   Social Determinants of Health   Financial Resource Strain: Not on file  Food Insecurity: Not on file  Transportation Needs: No Transportation Needs  . Lack of Transportation (Medical): No  . Lack of Transportation (Non-Medical): No  Physical Activity: Sufficiently Active  . Days of Exercise per Week: 7 days  . Minutes of Exercise per Session: 60 min  Stress: Not on file  Social Connections: Moderately Integrated  . Frequency of Communication with Friends and Family: More than three times a week  . Frequency of Social Gatherings with Friends and Family: More than three times a  week  . Attends Religious Services: More than 4 times per year  . Active Member of Clubs or Organizations: Yes  . Attends Archivist Meetings: More than 4 times per year  . Marital Status: Never married    Tobacco Counseling Counseling given: Not Answered   Clinical Intake:     Pain : No/denies pain Pain Score: 0-No pain     Nutritional Status: BMI 25 -29 Overweight Diabetes: No  How often do you need to have someone help you when you read instructions, pamphlets, or other written materials from your doctor or pharmacy?: 1 - Never What is the last grade level you completed in school?: college  Diabetic? no  Interpreter Needed?: No      Activities of Daily Living In your present state of health, do you have any difficulty performing the following activities: 08/26/2020 05/07/2020  Hearing? N N  Vision? N N  Difficulty concentrating or making decisions? N N  Walking or climbing stairs? N Y  Dressing or bathing? N N  Doing errands, shopping? N N  Preparing Food and eating ? N -  Using the Toilet? N -  In the past six months, have you accidently leaked urine? N -  Do you have problems with loss of bowel control? N -  Managing your Medications? N -  Managing your Finances? N -  Housekeeping or managing your Housekeeping? N -  Some recent data might be hidden    Patient Care Team: Fayrene Helper, MD as PCP - General  Indicate any recent Medical Services you may have received from other than [facility] providers in the past year (date may be approximate).     Assessment:   This is a routine wellness examination for [place].  Hearing/Vision screen No exam data present  Dietary issues and exercise activities discussed: Current Exercise Habits: Home exercise routine, Type of exercise: stretching, Time (Minutes): 60, Frequency (Times/Week): 6, Weekly Exercise (Minutes/Week): 360, Intensity: Moderate, Exercise limited by: orthopedic condition(s) (had hip  replacement)  Goals Addressed            This Visit's Progress   . DIET - INCREASE WATER INTAKE   On track    Increase water intake     . Exercise 3x per week (30 min per time)   On track    Patient would like to start a more routine exercise program and start using an eliptical machine    . LIFESTYLE - DECREASE FALLS RISK   On track     Depression Screen PHQ 2/9 Scores 08/26/2020 03/31/2020 03/04/2020 08/20/2019 04/03/2019 08/17/2018 03/28/2018  PHQ - 2 Score 0 0 0 0 0 0 0    Fall Risk Fall Risk  08/26/2020 03/31/2020 03/04/2020 08/20/2019 04/03/2019  Falls in the past year? 0 0 0 0 1  Number falls in past yr: 0 0 0 0 0  Injury with Fall? 0 0 0 0 1  Risk Factor Category  - - - - -  Risk for fall due to : - No Fall Risks No Fall Risks - -  Follow up - Falls evaluation completed Falls evaluation completed - -    FALL RISK PREVENTION PERTAINING TO THE HOME:  Any stairs in or around the home? No   If so, are there any without handrails? No  Home free of loose throw rugs in walkways, pet beds, electrical cords, etc? Yes  Adequate lighting in your home to reduce risk of falls? Yes   ASSISTIVE DEVICES UTILIZED TO PREVENT FALLS:  Life alert? No  Use of a cane, walker or w/c? Yes  cane Grab bars in the bathroom? Yes  Shower chair or bench in shower? No  Elevated toilet seat or a handicapped toilet? Yes   TIMED UP AND GO:  Was the test performed? No .  Length of time to ambulate 10 feet:  sec.     Cognitive Function:     6CIT Screen 08/26/2020 08/20/2019 08/17/2018 08/15/2017 07/28/2016  What Year? 0 points 0 points 0 points 0 points 0 points  What month? 0 points 0 points 0 points 0 points 0 points  What time? 0 points 0 points 0 points 0 points 0 points  Count back from 20 0 points 0 points 0 points 0 points 0 points  Months in reverse 0 points 0 points 0 points 0 points 0  points  Repeat phrase 0 points 0 points 0 points 0 points 0 points  Total Score 0 0 0 0 0     Immunizations Immunization History  Administered Date(s) Administered  . Fluad Quad(high Dose 65+) 01/15/2019, 02/05/2020  . Influenza Split 02/04/2011  . Influenza Whole 04/03/2009, 01/01/2010  . Influenza, High Dose Seasonal PF 03/28/2018  . Influenza,inj,Quad PF,6+ Mos 03/20/2013, 01/24/2014, 02/24/2015, 02/03/2016, 03/23/2017  . Moderna SARS-COV2 Booster Vaccination 04/10/2020  . Moderna Sars-Covid-2 Vaccination 06/13/2019, 07/11/2019  . Pneumococcal Conjugate-13 07/03/2014  . Pneumococcal Polysaccharide-23 03/20/2013  . Td 04/03/2009  . Zoster 02/04/2011    TDAP status: Due, Education has been provided regarding the importance of this vaccine. Advised may receive this vaccine at local pharmacy or Health Dept. Aware to provide a copy of the vaccination record if obtained from local pharmacy or Health Dept. Verbalized acceptance and understanding.  Flu Vaccine status: Up to date  Pneumococcal vaccine status: Up to date  Covid-19 vaccine status: Completed vaccines  Qualifies for Shingles Vaccine? Yes   Zostavax completed Yes   Shingrix Completed?: No.    Education has been provided regarding the importance of this vaccine. Patient has been advised to call insurance company to determine out of pocket expense if they have not yet received this vaccine. Advised may also receive vaccine at local pharmacy or Health Dept. Verbalized acceptance and understanding.  Screening Tests Health Maintenance  Topic Date Due  . TETANUS/TDAP  04/04/2019  . INFLUENZA VACCINE  11/24/2020  . MAMMOGRAM  07/31/2022  . COLONOSCOPY (Pts 45-62yrs Insurance coverage will need to be confirmed)  10/23/2023  . DEXA SCAN  Completed  . COVID-19 Vaccine  Completed  . Hepatitis C Screening  Completed  . PNA vac Low Risk Adult  Completed  . HPV VACCINES  Aged Out    Health Maintenance  Health Maintenance Due  Topic Date Due  . TETANUS/TDAP  04/04/2019    Colorectal cancer screening: Type of  screening: Colonoscopy. Completed 5. Repeat every 5 years  Mammogram status: Completed 07/2020. Repeat every year  Bone Density status: Completed 2019. Results reflect: Bone density results: NORMAL. Repeat every 5 years.  Lung Cancer Screening: (Low Dose CT Chest recommended if Age 6-80 years, 30 pack-year currently smoking OR have quit w/in 15years.) does not qualify.   Lung Cancer Screening Referral: no  Additional Screening:  Hepatitis C Screening: does qualify; Completed   Vision Screening: Recommended annual ophthalmology exams for early detection of glaucoma and other disorders of the eye. Is the patient up to date with their annual eye exam?  Yes  Who is the provider or what is the name of the office in which the patient attends annual eye exams? Dr Valetta Close  If pt is not established with a provider, would they like to be referred to a provider to establish care? No .   Dental Screening: Recommended annual dental exams for proper oral hygiene  Community Resource Referral / Chronic Care Management: CRR required this visit?  No   CCM required this visit?  No      Plan:     I have personally reviewed and noted the following in the patient's chart:   . Medical and social history . Use of alcohol, tobacco or illicit drugs  . Current medications and supplements including opioid prescriptions.  . Functional ability and status . Nutritional status . Physical activity . Advanced directives . List of other physicians . Hospitalizations, surgeries, and ER visits in previous 12 months .  Vitals . Screenings to include cognitive, depression, and falls . Referrals and appointments  In addition, I have reviewed and discussed with patient certain preventive protocols, quality metrics, and best practice recommendations. A written personalized care plan for preventive services as well as general preventive health recommendations were provided to patient.     Kate Sable, LPN,  LPN   11/25/1570   Nurse Notes: Visit performed by telephone. Patient at home and supervising provider in the office. Patient consents to telephone visit. Time spent with pt 25 mins.

## 2020-08-27 DIAGNOSIS — Z23 Encounter for immunization: Secondary | ICD-10-CM | POA: Diagnosis not present

## 2020-10-07 ENCOUNTER — Encounter: Payer: Self-pay | Admitting: Family Medicine

## 2020-10-08 ENCOUNTER — Encounter: Payer: Self-pay | Admitting: Family Medicine

## 2020-10-14 NOTE — Telephone Encounter (Signed)
Reached out to the pt, advised of the cancellation list, however she is on the sch for 7-14

## 2020-10-16 ENCOUNTER — Other Ambulatory Visit: Payer: Self-pay

## 2020-10-16 ENCOUNTER — Encounter: Payer: Self-pay | Admitting: Family Medicine

## 2020-10-16 ENCOUNTER — Ambulatory Visit (INDEPENDENT_AMBULATORY_CARE_PROVIDER_SITE_OTHER): Payer: Medicare Other | Admitting: Family Medicine

## 2020-10-16 VITALS — BP 124/75 | HR 83 | Temp 98.7°F | Resp 18 | Ht 65.5 in | Wt 163.0 lb

## 2020-10-16 DIAGNOSIS — M7989 Other specified soft tissue disorders: Secondary | ICD-10-CM

## 2020-10-16 DIAGNOSIS — R7301 Impaired fasting glucose: Secondary | ICD-10-CM

## 2020-10-16 DIAGNOSIS — E785 Hyperlipidemia, unspecified: Secondary | ICD-10-CM

## 2020-10-16 DIAGNOSIS — R7303 Prediabetes: Secondary | ICD-10-CM

## 2020-10-16 DIAGNOSIS — E559 Vitamin D deficiency, unspecified: Secondary | ICD-10-CM | POA: Diagnosis not present

## 2020-10-16 DIAGNOSIS — L600 Ingrowing nail: Secondary | ICD-10-CM

## 2020-10-16 DIAGNOSIS — I10 Essential (primary) hypertension: Secondary | ICD-10-CM | POA: Diagnosis not present

## 2020-10-16 DIAGNOSIS — L918 Other hypertrophic disorders of the skin: Secondary | ICD-10-CM | POA: Diagnosis not present

## 2020-10-16 DIAGNOSIS — L989 Disorder of the skin and subcutaneous tissue, unspecified: Secondary | ICD-10-CM | POA: Diagnosis not present

## 2020-10-16 NOTE — Patient Instructions (Addendum)
F/u as before, call if you need me sooner  AYou are referred to Gen surg for removal of skin lesion  You are referred to Podiatry for infrown toenails  Please seek a massage therapist , I will try to get contact info for one also  Fasting cBC, lipid, cmp and EGFr, hBA1C, TSH and vit d 1 week before next appt  PLease get your TdAp and shingrix vaccines at the pharmacy  Script for knee high compression hose for right leg, 15 to 19 mm hg to be prescribed  It is important that you exercise regularly at least 30 minutes 5 times a week. If you develop chest pain, have severe difficulty breathing, or feel very tired, stop exercising immediately and seek medical attention   Thanks for choosing [facility], we consider it a privelige to serve you.

## 2020-10-16 NOTE — Progress Notes (Signed)
   Adrienne [name]     MRN: [id number]      DOB: December 21, 1945   HPI Adrienne [name] is here for follow up and re-evaluation of chronic medical conditions, medication management and review of any available recent lab and radiology data.  Preventive health is updated, specifically  Cancer screening and Immunization.    Persistent right buttock discomfort post right hip replacement  in Jan 2022, also weakness/ instability of right leg, relies on cane for safety tho no near falls. Wants massages for pain and will continue home exercise till she sees Ortho again  Left  posterior skin tag upper back  Left ingrown toenail worse than right, uncomfortable  Intermittent right leg swelling needs knee high hose      ROS Denies recent fever or chills. Denies sinus pressure, nasal congestion, ear pain or sore throat. Denies chest congestion, productive cough or wheezing. Denies chest pains, palpitations and leg swelling Denies abdominal pain, nausea, vomiting,diarrhea or constipation.   Denies dysuria, frequency, hesitancy or incontinence.  Denies headaches, seizures, numbness, or tingling. Denies depression, anxiety or insomnia. Denies skin break down or rash.   PE  BP 124/75 (BP Location: Right Arm, Patient Position: Sitting, Cuff Size: Large)   Pulse 83   Temp 98.7 F (37.1 C)   Resp 18   Ht 5' 5.5" (1.664 m)   Wt 163 lb (73.9 kg)   SpO2 95%   BMI 26.71 kg/m   Patient alert and oriented and in no cardiopulmonary distress.  HEENT: No facial asymmetry, EOMI,     Neck supple .  Chest: Clear to auscultation bilaterally.  CVS: S1, S2 no murmurs, no S3.Regular rate.    Ext: Trace right LE edema  MS: Adequate though reduced ROM spine, shoulders, hips and knees.  Skin: Intact, no ulcerations or rash noted.posterior skin tag upper lumbar  Psych: Good eye contact, normal affect. Memory intact not anxious or depressed appearing.  CNS: CN 2-12 intact, power,  normal throughout.no focal  deficits noted.   Assessment & Plan  Essential hypertension Controlled, no change in medication DASH diet and commitment to daily physical activity for a minimum of 30 minutes discussed and encouraged, as a part of hypertension management. The importance of attaining a healthy weight is also discussed.  BP/Weight 10/16/2020 08/26/2020 05/16/2020 05/07/2020 03/31/2020 03/04/2020 0/25/8527  Systolic BP 782 - 423 536 144 315 400  Diastolic BP 75 - 83 79 88 74 78  Wt. (Lbs) 163 162 166 166 166.12 166.4 164  BMI 26.71 26.48 27.2 27.2 27.22 27.27 26.88       Leg swelling Intermittent right leg edema, will benefit from knee high compression hose, script sent for same, 15 to 19 mmHg  Venous insufficiency of both lower extremities Need compression hose for right leg  Ingrown toenail of both feet Increasingly uncomfortable,left also affected but not as severe, refer to Podiatry  Acquired skin tag incresing in size, itching and uncomfortable, wants this removed, refer to gen surgery, thoracolumbar area approx diameter 1.5 cm , flat base

## 2020-10-19 ENCOUNTER — Encounter: Payer: Self-pay | Admitting: Family Medicine

## 2020-10-19 DIAGNOSIS — L918 Other hypertrophic disorders of the skin: Secondary | ICD-10-CM | POA: Insufficient documentation

## 2020-10-19 NOTE — Assessment & Plan Note (Signed)
incresing in size, itching and uncomfortable, wants this removed, refer to gen surgery, thoracolumbar area approx diameter 1.5 cm , flat base

## 2020-10-19 NOTE — Assessment & Plan Note (Signed)
Intermittent right leg edema, will benefit from knee high compression hose, script sent for same, 15 to 19 mmHg

## 2020-10-19 NOTE — Assessment & Plan Note (Signed)
Increasingly uncomfortable,left also affected but not as severe, refer to Podiatry

## 2020-10-19 NOTE — Assessment & Plan Note (Signed)
Need compression hose for right leg

## 2020-10-19 NOTE — Assessment & Plan Note (Signed)
Controlled, no change in medication DASH diet and commitment to daily physical activity for a minimum of 30 minutes discussed and encouraged, as a part of hypertension management. The importance of attaining a healthy weight is also discussed.  BP/Weight 10/16/2020 08/26/2020 05/16/2020 05/07/2020 03/31/2020 03/04/2020 2/00/3794  Systolic BP 446 - 190 122 241 146 431  Diastolic BP 75 - 83 79 88 74 78  Wt. (Lbs) 163 162 166 166 166.12 166.4 164  BMI 26.71 26.48 27.2 27.2 27.22 27.27 26.88

## 2020-10-23 ENCOUNTER — Ambulatory Visit (INDEPENDENT_AMBULATORY_CARE_PROVIDER_SITE_OTHER): Payer: Medicare Other | Admitting: Podiatry

## 2020-10-23 ENCOUNTER — Other Ambulatory Visit: Payer: Self-pay

## 2020-10-23 ENCOUNTER — Encounter: Payer: Self-pay | Admitting: Podiatry

## 2020-10-23 DIAGNOSIS — L600 Ingrowing nail: Secondary | ICD-10-CM | POA: Diagnosis not present

## 2020-10-23 NOTE — Progress Notes (Signed)
Subjective:   Patient ID: Adrienne [name], female   DOB: 75 y.o.   MRN: [id number]   HPI Patient presents with chronic ingrown toenails of both feet with the left one being both sides with a abnormal appearance of the nail and the right would be one quarter that is really sore.  States she tries to work on them herself its been going on at least 3 years and she is tried soaks trams without relief and has not had infection.  She does not smoke likes to be active   Review of Systems  All other systems reviewed and are negative.      Objective:  Physical Exam Vitals and nursing note reviewed.  Constitutional:      Appearance: She is well-developed.  Pulmonary:     Effort: Pulmonary effort is normal.  Musculoskeletal:        General: Normal range of motion.  Skin:    General: Skin is warm.  Neurological:     Mental Status: She is alert.    Neurovascular status intact muscle strength was found to be adequate range of motion adequate.  Patient is noted to have a severely incurvated left hallux both medial lateral side with a home in the middle secondary to structural damage to the nail plate and the right hallux shows medial damage that is painful when pressed.  Patient has good digital perfusion well oriented x3     Assessment:  Severe ingrown toenail deformity hallux left medial border right with damage to the entire nail plate left     Plan:  H&P reviewed condition at great length.  I did discuss total nail removal left she does not want this and I explained removal of the borders but ultimately total nail may need to be removed.  She is comfortable with this approach and for the right we will do the one border and I allowed her to sign consent form after review.  I infiltrated each hallux 60 mg like Marcaine mixture sterile prep done using sterile instrumentation remove the medial lateral border left apply chemical phenol 3 applications 30 seconds followed by alcohol by sterile  dressing and for the right I remove the medial border exposed matrix applied phenol 3 applications 30 seconds followed by alcohol lavage with sterile dressing.  Gave instructions for soaks leave dressing on 24 hours take it off earlier if any throbbing were to occur encouraged her to call questions concerns

## 2020-10-23 NOTE — Patient Instructions (Signed)

## 2020-10-24 ENCOUNTER — Telehealth: Payer: Self-pay | Admitting: *Deleted

## 2020-10-24 NOTE — Telephone Encounter (Signed)
Patient is calling and would like to know is she should take a shower with bandage on or off?  Returned call to patient and explained that she is to keep the orginial bandage on for 24 hours after precedure. When taking a shower, keep wrapped, remove to soak for 20 min.,pat dry, apply ointment if required.  Call back for any signs of infections.Verbalized understanding.

## 2020-11-04 ENCOUNTER — Encounter: Payer: Self-pay | Admitting: General Surgery

## 2020-11-04 ENCOUNTER — Ambulatory Visit (INDEPENDENT_AMBULATORY_CARE_PROVIDER_SITE_OTHER): Payer: Medicare Other | Admitting: General Surgery

## 2020-11-04 ENCOUNTER — Other Ambulatory Visit: Payer: Self-pay

## 2020-11-04 VITALS — BP 121/81 | HR 60 | Temp 98.6°F | Resp 12 | Ht 65.5 in | Wt 162.0 lb

## 2020-11-04 DIAGNOSIS — L918 Other hypertrophic disorders of the skin: Secondary | ICD-10-CM

## 2020-11-05 NOTE — Progress Notes (Signed)
Adrienne [name]; [id number]; 07/10/1945   HPI Patient is a 75 year old black female who was referred to my care by Dr. Tula Nakayama for a skin tag on her back.  Is been present for some time, but is causing her increasing discomfort as her clothing rubs and irritates it.  She would like it removed. Past Medical History:  Diagnosis Date   Allergy    Arthritis    Diverticulosis 05/2009   GERD (gastroesophageal reflux disease)    Hyperlipidemia    Hypertension     Past Surgical History:  Procedure Laterality Date   ABDOMINAL HYSTERECTOMY  1990   for fibroids ovaries remain    CHOLECYSTECTOMY  1980   COLONOSCOPY  05/2009   rare diverticula, small internal hemorrhoids. Next colonoscopy in 5-10 yrs.   COLONOSCOPY N/A 10/23/2018   Procedure: COLONOSCOPY;  Surgeon: Danie Binder, MD;  Location: [facility];  Service: Endoscopy;  Laterality: N/A;  2:00pm   JOINT REPLACEMENT N/A    [employer]reesia 06/02/2020   left nipple inversion bx benign  2000   POLYPECTOMY  10/23/2018   Procedure: POLYPECTOMY;  Surgeon: Danie Binder, MD;  Location: [facility];  Service: Endoscopy;;   TOTAL HIP ARTHROPLASTY Right 05/16/2020   Procedure: TOTAL HIP ARTHROPLASTY;  Surgeon: Earlie Server, MD;  Location: [facility];  Service: Orthopedics;  Laterality: Right;    Family History  Problem Relation Age of Onset   Kidney failure Father    Arthritis Father    Heart disease Sister    Hypertension Sister    Arthritis Sister    Breast cancer Sister    Diabetes Sister    Arthritis Sister    Lung cancer Brother        was a smoker   Arthritis Brother    Arthritis Mother    Prostate cancer Brother    Colon cancer Neg Hx     Current Outpatient Medications on File Prior to Visit  Medication Sig Dispense Refill   acetaminophen (TYLENOL) 650 MG CR tablet Take 2 tablets (1,300 mg total) by mouth daily as needed for pain. 100 tablet 0   amLODipine (NORVASC) 2.5 MG tablet Take 1 tablet (2.5 mg total) by mouth  daily. 90 tablet 2   Biotin 10 MG CAPS Take 10 mg by mouth every evening.     Lidocaine-Menthol (ICY HOT LIDOCAINE PLUS MENTHOL EX) Apply 1 application topically 4 (four) times daily as needed (pain.). Icy Hot Cream with Lidocaine plus Menthol Cream     Liniments (BLUE-EMU SUPER STRENGTH EX) Apply 1 application topically 4 (four) times daily as needed (pain.).     Liniments (PAIN RELIEF EX) Apply 1 application topically 4 (four) times daily as needed (pain.). Triderma Pain Relief Cream     loratadine (CLARITIN) 10 MG tablet Take 10 mg by mouth daily as needed for allergies.     Menthol, Topical Analgesic, (ICE BLUE EX) Apply 1 application topically 4 (four) times daily as needed (pain.).     Menthol-Methyl Salicylate (MUSCLE RUB) 10-15 % CREA Apply 1 application topically as needed for muscle pain.     Multiple Minerals-Vitamins (CAL MAG ZINC +D3 PO) Take 1 tablet by mouth every evening.     Multiple Vitamin (MULTIVITAMIN WITH MINERALS) TABS tablet Take 1 tablet by mouth every evening. Women's Multivitamin 50+     triamterene-hydrochlorothiazide (MAXZIDE-25) 37.5-25 MG tablet Take 1 tablet by mouth daily. 90 tablet 2   TURMERIC PO Take 450 mg by mouth every evening.  No current facility-administered medications on file prior to visit.    Allergies  Allergen Reactions   Valsartan Shortness Of Breath   Ace Inhibitors Cough    Social History   Substance and Sexual Activity  Alcohol Use Not Currently   Comment: rarely     Social History   Tobacco Use  Smoking Status Never  Smokeless Tobacco Never    Review of Systems  Constitutional: Negative.   HENT: Negative.    Eyes: Negative.   Respiratory: Negative.    Cardiovascular: Negative.   Gastrointestinal: Negative.   Genitourinary: Negative.   Musculoskeletal: Negative.   Neurological: Negative.   Endo/Heme/Allergies: Negative.   Psychiatric/Behavioral: Negative.     Objective   Vitals:   11/04/20 1354  BP: 121/81   Pulse: 60  Resp: 12  Temp: 98.6 F (37 C)  SpO2: 97%    Physical Exam Vitals reviewed.  Constitutional:      Appearance: Normal appearance. She is not ill-appearing.  HENT:     Head: Normocephalic and atraumatic.  Cardiovascular:     Rate and Rhythm: Normal rate and regular rhythm.     Heart sounds: Normal heart sounds. No murmur heard.   No friction rub. No gallop.  Pulmonary:     Effort: Pulmonary effort is normal. No respiratory distress.     Breath sounds: Normal breath sounds. No stridor. No wheezing, rhonchi or rales.  Skin:    Comments: Benign appearing 3 mm skin tag noted in the left lateral lower back.  Informed consent obtained.  Area prepped with Betadine.  1% Xylocaine used for local anesthesia.  Shave removal using eye cautery of the Skintact was performed.  It was disposed of.  Patient tolerated the procedure well.  Neurological:     Mental Status: She is alert and oriented to person, place, and time.    Assessment  Skin tag, back, resolved Plan  Follow-up here as needed.

## 2020-11-06 ENCOUNTER — Ambulatory Visit: Payer: Medicare Other | Admitting: Family Medicine

## 2020-11-09 ENCOUNTER — Other Ambulatory Visit: Payer: Self-pay | Admitting: Family Medicine

## 2020-11-17 DIAGNOSIS — M5416 Radiculopathy, lumbar region: Secondary | ICD-10-CM | POA: Diagnosis not present

## 2020-11-17 DIAGNOSIS — M1611 Unilateral primary osteoarthritis, right hip: Secondary | ICD-10-CM | POA: Diagnosis not present

## 2020-12-29 ENCOUNTER — Encounter: Payer: Self-pay | Admitting: Family Medicine

## 2021-01-23 ENCOUNTER — Other Ambulatory Visit: Payer: Self-pay | Admitting: Family Medicine

## 2021-02-02 DIAGNOSIS — R7301 Impaired fasting glucose: Secondary | ICD-10-CM | POA: Diagnosis not present

## 2021-02-02 DIAGNOSIS — E785 Hyperlipidemia, unspecified: Secondary | ICD-10-CM | POA: Diagnosis not present

## 2021-02-02 DIAGNOSIS — E559 Vitamin D deficiency, unspecified: Secondary | ICD-10-CM | POA: Diagnosis not present

## 2021-02-02 DIAGNOSIS — I10 Essential (primary) hypertension: Secondary | ICD-10-CM | POA: Diagnosis not present

## 2021-02-03 ENCOUNTER — Other Ambulatory Visit: Payer: Self-pay

## 2021-02-03 ENCOUNTER — Ambulatory Visit (INDEPENDENT_AMBULATORY_CARE_PROVIDER_SITE_OTHER): Payer: Medicare Other | Admitting: Family Medicine

## 2021-02-03 ENCOUNTER — Encounter: Payer: Self-pay | Admitting: Family Medicine

## 2021-02-03 VITALS — BP 123/73 | HR 73 | Resp 17 | Ht 65.5 in | Wt 161.0 lb

## 2021-02-03 DIAGNOSIS — R2681 Unsteadiness on feet: Secondary | ICD-10-CM | POA: Diagnosis not present

## 2021-02-03 DIAGNOSIS — M8580 Other specified disorders of bone density and structure, unspecified site: Secondary | ICD-10-CM | POA: Insufficient documentation

## 2021-02-03 DIAGNOSIS — Z780 Asymptomatic menopausal state: Secondary | ICD-10-CM

## 2021-02-03 DIAGNOSIS — I10 Essential (primary) hypertension: Secondary | ICD-10-CM | POA: Diagnosis not present

## 2021-02-03 DIAGNOSIS — Z23 Encounter for immunization: Secondary | ICD-10-CM

## 2021-02-03 DIAGNOSIS — R7303 Prediabetes: Secondary | ICD-10-CM | POA: Diagnosis not present

## 2021-02-03 DIAGNOSIS — E785 Hyperlipidemia, unspecified: Secondary | ICD-10-CM | POA: Diagnosis not present

## 2021-02-03 LAB — VITAMIN D 25 HYDROXY (VIT D DEFICIENCY, FRACTURES): Vit D, 25-Hydroxy: 38.3 ng/mL (ref 30.0–100.0)

## 2021-02-03 LAB — CBC
Hematocrit: 38.1 % (ref 34.0–46.6)
Hemoglobin: 12.8 g/dL (ref 11.1–15.9)
MCH: 29.1 pg (ref 26.6–33.0)
MCHC: 33.6 g/dL (ref 31.5–35.7)
MCV: 87 fL (ref 79–97)
Platelets: 302 10*3/uL (ref 150–450)
RBC: 4.4 x10E6/uL (ref 3.77–5.28)
RDW: 12.8 % (ref 11.7–15.4)
WBC: 6.5 10*3/uL (ref 3.4–10.8)

## 2021-02-03 LAB — HEMOGLOBIN A1C
Est. average glucose Bld gHb Est-mCnc: 128 mg/dL
Hgb A1c MFr Bld: 6.1 % — ABNORMAL HIGH (ref 4.8–5.6)

## 2021-02-03 LAB — CMP14+EGFR
ALT: 12 IU/L (ref 0–32)
AST: 17 IU/L (ref 0–40)
Albumin/Globulin Ratio: 1.6 (ref 1.2–2.2)
Albumin: 4.1 g/dL (ref 3.7–4.7)
Alkaline Phosphatase: 113 IU/L (ref 44–121)
BUN/Creatinine Ratio: 16 (ref 12–28)
BUN: 15 mg/dL (ref 8–27)
Bilirubin Total: 0.5 mg/dL (ref 0.0–1.2)
CO2: 26 mmol/L (ref 20–29)
Calcium: 10.3 mg/dL (ref 8.7–10.3)
Chloride: 101 mmol/L (ref 96–106)
Creatinine, Ser: 0.95 mg/dL (ref 0.57–1.00)
Globulin, Total: 2.5 g/dL (ref 1.5–4.5)
Glucose: 82 mg/dL (ref 70–99)
Potassium: 4.7 mmol/L (ref 3.5–5.2)
Sodium: 140 mmol/L (ref 134–144)
Total Protein: 6.6 g/dL (ref 6.0–8.5)
eGFR: 63 mL/min/{1.73_m2} (ref >59)

## 2021-02-03 LAB — LIPID PANEL
Chol/HDL Ratio: 2.5 ratio (ref 0.0–4.4)
Cholesterol, Total: 242 mg/dL — ABNORMAL HIGH (ref 100–199)
HDL: 95 mg/dL (ref >39)
LDL Chol Calc (NIH): 132 mg/dL — ABNORMAL HIGH (ref 0–99)
Triglycerides: 89 mg/dL (ref 0–149)
VLDL Cholesterol Cal: 15 mg/dL (ref 5–40)

## 2021-02-03 LAB — TSH: TSH: 1.33 u[IU]/mL (ref 0.450–4.500)

## 2021-02-03 NOTE — Progress Notes (Signed)
Adrienne [name]     MRN: [id number]      DOB: [birth year]   HPI Adrienne [name] is here for follow up and re-evaluation of chronic medical conditions, medication management and review of any available recent lab and radiology data.  Preventive health is updated, specifically  Cancer screening and Immunization.   Questions or concerns regarding consultations or procedures which the PT has had in the interim are  addressed. The PT denies any adverse reactions to current medications since the last visit.  C/o unsteADINESS AND DIFFICULTY INITIANG WALKING, HAS HAD NO FALLS, STILL USES A CANE  ROS Denies recent fever or chills. Denies sinus pressure, nasal congestion, ear pain or sore throat. Denies chest congestion, productive cough or wheezing. Denies chest pains, palpitations and leg swelling Denies abdominal pain, nausea, vomiting,diarrhea or constipation.   Denies dysuria, frequency, hesitancy or incontinence.  Denies headaches, seizures, numbness, or tingling. Denies depression, anxiety or insomnia. Denies skin break down or rash.   PE  BP 123/73   Pulse 73   Resp 17   Ht 5' 5.5" (1.664 m)   Wt 161 lb (73 kg)   SpO2 98%   BMI 26.38 kg/m   Patient alert and oriented and in no cardiopulmonary distress.  HEENT: No facial asymmetry, EOMI,     Neck supple .  Chest: Clear to auscultation bilaterally.  CVS: S1, S2 no murmurs, no S3.Regular rate.  ABD: Soft non tender.   Ext: No edema  MS: decreased ROM spine, shoulders, hips and knees.  Skin: Intact, no ulcerations or rash noted.  Psych: Good eye contact, normal affect. Memory intact not anxious or depressed appearing.  CNS: CN 2-12 intact, power,  normal throughout.no focal deficits noted.   Assessment & Plan  Essential hypertension Controlled, no change in medication DASH diet and commitment to daily physical activity for a minimum of 30 minutes discussed and encouraged, as a part of hypertension management. The  importance of attaining a healthy weight is also discussed.  BP/Weight 02/03/2021 11/04/2020 10/16/2020 08/26/2020 05/16/2020 05/07/2020 60/10/3708  Systolic BP 626 948 546 - 270 350 093  Diastolic BP 73 81 75 - 83 79 88  Wt. (Lbs) 161 162 163 162 166 166 166.12  BMI 26.38 26.55 26.71 26.48 27.2 27.2 27.22       Unsteady gait PT twice weekly x 4 weeks, reports increased difficulty initiating walking with unsteady gait, still using a cane  Hyperlipidemia LDL goal <100 Hyperlipidemia:Low fat diet discussed and encouraged.   Lipid Panel  Lab Results  Component Value Date   CHOL 242 (H) 02/02/2021   HDL 95 02/02/2021   LDLCALC 132 (H) 02/02/2021   TRIG 89 02/02/2021   CHOLHDL 2.5 02/02/2021     Needs to reduce fat intake, maintained on no med  Osteopenia Repeat dexa to evaluate. Advised ensure calcium 1200 mg daily and vit D3, q00 IU daily, and regular weight bearing exercise as able  Prediabetes Deteriorated Updated lab needed at/ before next visit. Patient educated about the importance of limiting  Carbohydrate intake , the need to commit to daily physical activity for a minimum of 30 minutes , and to commit weight loss. The fact that changes in all these areas will reduce or eliminate all together the development of diabetes is stressed.   Diabetic Labs Latest Ref Rng & Units 02/02/2021 05/07/2020 03/05/2020 03/04/2020 03/04/2020  HbA1c 4.8 - 5.6 % 6.1(H) - - 5.9 5.9  Chol 100 - 199 mg/dL 242(H) - 249(H) - -  HDL >39 mg/dL 95 - 111 - -  Calc LDL 0 - 99 mg/dL 132(H) - 125(H) - -  Triglycerides 0 - 149 mg/dL 89 - 80 - -  Creatinine 0.57 - 1.00 mg/dL 0.95 0.86 0.88 - -   BP/Weight 02/03/2021 11/04/2020 10/16/2020 08/26/2020 05/16/2020 05/07/2020 76/04/5181  Systolic BP 437 357 897 - 847 841 282  Diastolic BP 73 81 75 - 83 79 88  Wt. (Lbs) 161 162 163 162 166 166 166.12  BMI 26.38 26.55 26.71 26.48 27.2 27.2 27.22   No flowsheet data found.

## 2021-02-03 NOTE — Assessment & Plan Note (Signed)
Controlled, no change in medication DASH diet and commitment to daily physical activity for a minimum of 30 minutes discussed and encouraged, as a part of hypertension management. The importance of attaining a healthy weight is also discussed.  BP/Weight 02/03/2021 11/04/2020 10/16/2020 08/26/2020 05/16/2020 05/07/2020 79/07/8014  Systolic BP 553 748 270 - 786 754 492  Diastolic BP 73 81 75 - 83 79 88  Wt. (Lbs) 161 162 163 162 166 166 166.12  BMI 26.38 26.55 26.71 26.48 27.2 27.2 27.22

## 2021-02-03 NOTE — Assessment & Plan Note (Signed)
Repeat dexa to evaluate. Advised ensure calcium 1200 mg daily and vit D3, q00 IU daily, and regular weight bearing exercise as able

## 2021-02-03 NOTE — Assessment & Plan Note (Signed)
Deteriorated Updated lab needed at/ before next visit. Patient educated about the importance of limiting  Carbohydrate intake , the need to commit to daily physical activity for a minimum of 30 minutes , and to commit weight loss. The fact that changes in all these areas will reduce or eliminate all together the development of diabetes is stressed.   Diabetic Labs Latest Ref Rng & Units 02/02/2021 05/07/2020 03/05/2020 03/04/2020 03/04/2020  HbA1c 4.8 - 5.6 % 6.1(H) - - 5.9 5.9  Chol 100 - 199 mg/dL 242(H) - 249(H) - -  HDL >39 mg/dL 95 - 111 - -  Calc LDL 0 - 99 mg/dL 132(H) - 125(H) - -  Triglycerides 0 - 149 mg/dL 89 - 80 - -  Creatinine 0.57 - 1.00 mg/dL 0.95 0.86 0.88 - -   BP/Weight 02/03/2021 11/04/2020 10/16/2020 08/26/2020 05/16/2020 05/07/2020 26/10/1243  Systolic BP 809 983 382 - 505 397 673  Diastolic BP 73 81 75 - 83 79 88  Wt. (Lbs) 161 162 163 162 166 166 166.12  BMI 26.38 26.55 26.71 26.48 27.2 27.2 27.22   No flowsheet data found.

## 2021-02-03 NOTE — Patient Instructions (Addendum)
F/U in 6 months, call if you need me sooner  Flu vaccine today  You are referred to PT twice weekly for 4 weeks for unsteady gait  Please commit to calcium 1000 to 1200 mg daily and vit D3, 1000 iU daily for thinning bones and 30 mins/ day of weight bearing exercise, like walking  Please schedule bone density test at checkout  Please change food choice as blood sugar and cholesterol have both increased, labs were otherwise EXCELLENT  Fasting lipid, chem 7 and EGGFR and hBA1c 1 week before follow up ' It is important that you exercise regularly at least 30 minutes 5 times a week. If you develop chest pain, have severe difficulty breathing, or feel very tired, stop exercising immediately and seek medical attention   Think about what you will eat, plan ahead. Choose " clean, green, fresh or frozen" over canned, processed or packaged foods which are more sugary, salty and fatty. 70 to 75% of food eaten should be vegetables and fruit. Three meals at set times with snacks allowed between meals, but they must be fruit or vegetables. Aim to eat over a 12 hour period , example 7 am to 7 pm, and STOP after  your last meal of the day. Drink water,generally about 64 ounces per day, no other drink is as healthy. Fruit juice is best enjoyed in a healthy way, by EATING the fruit.

## 2021-02-03 NOTE — Assessment & Plan Note (Signed)
Hyperlipidemia:Low fat diet discussed and encouraged.   Lipid Panel  Lab Results  Component Value Date   CHOL 242 (H) 02/02/2021   HDL 95 02/02/2021   LDLCALC 132 (H) 02/02/2021   TRIG 89 02/02/2021   CHOLHDL 2.5 02/02/2021     Needs to reduce fat intake, maintained on no med

## 2021-02-03 NOTE — Assessment & Plan Note (Addendum)
PT twice weekly x 4 weeks, reports increased difficulty initiating walking with unsteady gait, still using a cane

## 2021-02-10 ENCOUNTER — Other Ambulatory Visit (HOSPITAL_COMMUNITY): Payer: Medicare Other

## 2021-02-10 DIAGNOSIS — H401131 Primary open-angle glaucoma, bilateral, mild stage: Secondary | ICD-10-CM | POA: Diagnosis not present

## 2021-02-11 ENCOUNTER — Ambulatory Visit (HOSPITAL_COMMUNITY)
Admission: RE | Admit: 2021-02-11 | Discharge: 2021-02-11 | Disposition: A | Discharge status: Home / Self Care | Payer: Medicare Other | Source: Ambulatory Visit | Attending: Family Medicine | Admitting: Family Medicine

## 2021-02-11 ENCOUNTER — Other Ambulatory Visit: Payer: Self-pay

## 2021-02-11 DIAGNOSIS — M8580 Other specified disorders of bone density and structure, unspecified site: Secondary | ICD-10-CM | POA: Diagnosis not present

## 2021-02-11 DIAGNOSIS — Z780 Asymptomatic menopausal state: Secondary | ICD-10-CM | POA: Insufficient documentation

## 2021-02-20 ENCOUNTER — Other Ambulatory Visit: Payer: Self-pay

## 2021-02-20 ENCOUNTER — Ambulatory Visit (HOSPITAL_COMMUNITY): Payer: Medicare Other | Attending: Family Medicine | Admitting: Physical Therapy

## 2021-02-20 DIAGNOSIS — R2681 Unsteadiness on feet: Secondary | ICD-10-CM | POA: Insufficient documentation

## 2021-02-20 DIAGNOSIS — M6281 Muscle weakness (generalized): Secondary | ICD-10-CM | POA: Diagnosis not present

## 2021-02-20 DIAGNOSIS — R262 Difficulty in walking, not elsewhere classified: Secondary | ICD-10-CM

## 2021-02-20 NOTE — Therapy (Signed)
[facility] [address] [place], [place], [address] Phone: [phone]   Fax:  [phone]  Physical Therapy Evaluation  Patient Details  Name: Marlissa Emerick MRN: [id number] Date of Birth: [birth year] Referring Provider (PT): Tula Nakayama   Encounter Date: 02/20/2021 [id number]  PT End of Session - 02/20/21 1616     Visit Number 1    Number of Visits 4    Date for PT Re-Evaluation 03/22/21    Authorization Type medicare/BCBS             Past Medical History:  Diagnosis Date   Allergy    Arthritis    Diverticulosis 05/2009   GERD (gastroesophageal reflux disease)    Hyperlipidemia    Hypertension     Past Surgical History:  Procedure Laterality Date   ABDOMINAL HYSTERECTOMY  1990   for fibroids ovaries remain    CHOLECYSTECTOMY  1980   COLONOSCOPY  05/2009   rare diverticula, small internal hemorrhoids. Next colonoscopy in 5-10 yrs.   COLONOSCOPY N/A 10/23/2018   Procedure: COLONOSCOPY;  Surgeon: Danie Binder, MD;  [facility];  Service: Endoscopy;  Laterality: N/A;  2:00pm   JOINT REPLACEMENT N/A    Phreesia 06/02/2020   left nipple inversion bx benign  2000   POLYPECTOMY  10/23/2018   Procedure: POLYPECTOMY;  Surgeon: Danie Binder, MD;  [facility];  Service: Endoscopy;;   TOTAL HIP ARTHROPLASTY Right 05/16/2020   Procedure: TOTAL HIP ARTHROPLASTY;  Surgeon: Earlie Server, MD;  [facility]: [facility];  Service: Orthopedics;  Laterality: Right;    There were no vitals filed for this visit.    Subjective Assessment - 02/20/21 1533     Subjective Adrienne [name] is a known pt to this clinic.  She was seen in 2021 for 26 visits and then again earlier this year for 15 visits for hip pain and gait disturbance and is now being referred again by her primary MD for gait disturbance.  Ms Yera.  She states that her issue is her balance she states that it is not all the time every once in awhile when  she turns she gets off balance.  She states that she has had vertigo in the past and is unsure if it has anything to do with that or not. She is still doing her bed exercises that were given to her in PT    Pertinent History OA, Rt THR, unsteady gait.    How long can you sit comfortably? no problem    How long can you stand comfortably? five minutes and then she will feel like she is off balance.    How long can you walk comfortably? 82minutes    Patient Stated Goals better balance    Currently in Pain? No/denies                [facility] PT Assessment - 02/20/21 0001       Assessment   Medical Diagnosis Gait imbalance    Referring Provider (PT) Tula Nakayama    Onset Date/Surgical Date 12/13/20    Prior Therapy 15 visits in 2022; 26 visit in 2021      Precautions   Precautions Fall      Restrictions   Weight Bearing Restrictions No      Balance Screen   Has the patient fallen in the past 6 months No    Has the patient had a decrease in activity level because of a fear  of falling?  No    Is the patient reluctant to leave their home because of a fear of falling?  No      Home Ecologist residence      Prior Function   Level of Independence Independent      Cognition   Overall Cognitive Status Within Functional Limits for tasks assessed      Functional Tests   Functional tests Sit to Stand;Single leg stance      Single Leg Stance   Comments Lt: 60"; RT 45"      Sit to Stand   Comments 5 in 30 "      ROM / Strength   AROM / PROM / Strength Strength      Strength   Strength Assessment Site Hip;Knee;Ankle    Right/Left Hip Right;Left    Right Hip Flexion 3/5    Right Hip Extension 3-/5    Right Hip ABduction 4/5    Left Hip Flexion 5/5    Left Hip Extension 4-/5    Left Hip ABduction 5/5    Right/Left Knee Left;Right    Right Knee Extension 5/5    Left Knee Extension 5/5    Right/Left Ankle Right;Left    Right Ankle  Dorsiflexion 5/5    Left Ankle Plantar Flexion 5/5      Special Tests   Other special tests Dix halpike manuever (-) B                        Objective measurements completed on examination: See above findings.       [facility] Adult PT Treatment/Exercise - 02/20/21 0001       Exercises   Exercises Knee/Hip      Knee/Hip Exercises: Standing   Forward Step Up Right;5 reps    SLS x2RT max 45"      Knee/Hip Exercises: Seated   Sit to Sand 10 reps      Knee/Hip Exercises: Supine   Straight Leg Raises Right;5 reps      Knee/Hip Exercises: Prone   Hip Extension Strengthening;Both                     PT Education - 02/20/21 1613     Education Details HEP    Person(s) Educated Patient    Methods Explanation    Comprehension Verbalized understanding              PT Short Term Goals - 02/20/21 1629       PT SHORT TERM GOAL #1   Title Pt to increase strength 1/2 mm grade to allow pt to come sit to stand from a low lying couch without the use of her UE>    Time 2    Period Weeks    Status New    Target Date 03/06/21      PT SHORT TERM GOAL #2   Title Pt to note that she has only lost her balance one time in the past week.    Time 2    Period Weeks    Status New               PT Long Term Goals - 02/20/21 1630       PT LONG TERM GOAL #1   Title PT to be I in an advance HEP to be able to increase RT LE strength 1 grade to feel confident in rejoining her  dance class.    Time 4    Period Weeks    Status New    Target Date 03/20/21      PT LONG TERM GOAL #2   Title Pt to be able to complete 14 sit to stand in a 30 second period without feeling off balance.    Time 4    Period Weeks    Status New      PT LONG TERM GOAL #3   Title Pt to note that she has not felt off balance at all in the past week    Time 4    Period Weeks    Status New                    Plan - 02/20/21 1546     Clinical Impression Statement  PT is a 75 yo female who complains of feeling off balance approximately three times a week, depending on what she is doing.  It occurs mainly when she turns and will last for a few seconds.  She has had a recent hip replacement and stopped using her cane about a week ago.   She has been referred to skilled PT to attempt to improve her balance.  Evaluation demonsates slight decreased balance, decreased strength of Rt hip.  Ms Shortridge will benefit from Skilled PT to progress a HEP until pt has improved stabiltiy in her Rt hip    Personal Factors and Comorbidities Comorbidity 2    Comorbidities OA, Rt THR    Examination-Activity Limitations Stand;Locomotion Level    Examination-Participation Restrictions Community Activity;Shop;Other    Stability/Clinical Decision Making Stable/Uncomplicated    Clinical Decision Making Low    Rehab Potential Good    PT Frequency 1x / week    PT Duration 4 weeks    PT Treatment/Interventions Patient/family education;Therapeutic exercise;Balance training    PT Next Visit Plan PT will be seen once a week check mm strength out each time and give appropriate update to HEP. Progress to vector stances, side stepping with t band, lateral step ups, lunges, lunge walking, heel toe gt...    PT Home Exercise Plan sit to stand, single leg stance, prone hip extension and step up             Patient will benefit from skilled therapeutic intervention in order to improve the following deficits and impairments:  Abnormal gait, Decreased strength, Decreased balance  Visit Diagnosis: Muscle weakness (generalized)  Difficulty in walking, not elsewhere classified     Problem List Patient Active Problem List   Diagnosis Date Noted   Unsteady gait 02/03/2021   Osteopenia 02/03/2021   Acquired skin tag 10/19/2020   Leg swelling 03/31/2020   Venous insufficiency of both lower extremities 03/31/2020   Vitamin D deficiency 03/06/2020   Prediabetes 03/06/2020   Knee pain,  right 04/06/2019   Left ankle sprain 04/06/2019   Personal history of colonic polyps 07/12/2018   Ingrown toenail of both feet 03/23/2017   Hip pain, chronic, right 02/03/2016   GERD (gastroesophageal reflux disease) 12/26/2014   Diverticulosis of colon without hemorrhage 07/05/2014   FH: pancreatic cancer 01/26/2014   FH: glaucoma 03/24/2013   Overweight (BMI 25.0-29.9) 03/13/2012   Hyperlipidemia LDL goal <100 04/03/2009   Essential hypertension 04/03/2009  Rayetta Humphrey, PT CLT [phone]  02/20/2021, 4:36 PM  [facility] [address] [place], [place], [address] Phone: [phone]   Fax:  [phone]  Name: Jariya  Gherardi MRN: [id number] Date of Birth: Nov 06, 1945

## 2021-02-27 ENCOUNTER — Ambulatory Visit (HOSPITAL_COMMUNITY): Payer: Medicare Other | Attending: Family Medicine

## 2021-02-27 ENCOUNTER — Other Ambulatory Visit: Payer: Self-pay

## 2021-02-27 DIAGNOSIS — M25551 Pain in right hip: Secondary | ICD-10-CM | POA: Insufficient documentation

## 2021-02-27 DIAGNOSIS — M25561 Pain in right knee: Secondary | ICD-10-CM | POA: Insufficient documentation

## 2021-02-27 DIAGNOSIS — M6281 Muscle weakness (generalized): Secondary | ICD-10-CM | POA: Diagnosis not present

## 2021-02-27 DIAGNOSIS — R262 Difficulty in walking, not elsewhere classified: Secondary | ICD-10-CM | POA: Insufficient documentation

## 2021-02-27 DIAGNOSIS — G8929 Other chronic pain: Secondary | ICD-10-CM | POA: Diagnosis not present

## 2021-02-27 NOTE — Therapy (Signed)
[facility] [place], [place], [address] Phone: [phone]   Fax:  [phone]  Physical Therapy Treatment  [name] MRN: [id number] Date of Birth: [birth year] Referring Provider (PT): Tula Nakayama   Encounter Date: 02/27/2021   PT End of Session - 02/27/21 1649     Visit Number 2    Number of Visits 4    Date for PT Re-Evaluation 03/22/21    Authorization Type medicare/BCBS    PT Start Time 2778    PT Stop Time 2423    PT Time Calculation (min) 44 min    Activity Tolerance Patient tolerated treatment well    Behavior During Therapy [facility] for tasks assessed/performed             Past Medical History:  Diagnosis Date   Allergy    Arthritis    Diverticulosis 05/2009   GERD (gastroesophageal reflux disease)    Hyperlipidemia    Hypertension     Past Surgical History:  Procedure Laterality Date   ABDOMINAL HYSTERECTOMY  1990   for fibroids ovaries remain    CHOLECYSTECTOMY  1980   COLONOSCOPY  05/2009   rare diverticula, small internal hemorrhoids. Next colonoscopy in 5-10 yrs.   COLONOSCOPY N/A 10/23/2018   Procedure: COLONOSCOPY;  Surgeon: Danie Binder, MD;  [facility];  Service: Endoscopy;  Laterality: N/A;  2:00pm   JOINT REPLACEMENT N/A    Phreesia 06/02/2020   left nipple inversion bx benign  2000   POLYPECTOMY  10/23/2018   Procedure: POLYPECTOMY;  Surgeon: Danie Binder, MD;  [facility];  Service: Endoscopy;;   TOTAL HIP ARTHROPLASTY Right 05/16/2020   Procedure: TOTAL HIP ARTHROPLASTY;  Surgeon: Earlie Server, MD;  Location[facility];  Service: Orthopedics;  Laterality: Right;    There were no vitals filed for this visit.   Subjective Assessment - 02/27/21 1718     Subjective Pt reports minimal episodes of dizziness but endorses continued balance issues and right buttocks discomfort when sitting. "Feels like a knot/brick I'm sitting on"     Pertinent History OA, Rt THR, unsteady gait.    How long can you sit comfortably? no problem    How long can you stand comfortably? five minutes and then she will feel like she is off balance.    How long can you walk comfortably? 55minutes    Patient Stated Goals better balance    Currently in Pain? No/denies                [facility] PT Assessment - 02/27/21 0001       Assessment   Medical Diagnosis Gait imbalance      Strength   Right Hip External Rotation  3/5    Right Hip Internal Rotation 3/5    Left Hip External Rotation 4/5    Left Hip Internal Rotation 4/5                           [facility] Adult PT Treatment/Exercise - 02/27/21 0001       Knee/Hip Exercises: Standing   Other Standing Knee Exercises sidestepping with green t-loop x 2 min      Knee/Hip Exercises: Seated   Clamshell with TheraBand Green    Other Seated Knee/Hip Exercises hip internal rotation with green t-loop    Sit to Sand 10 reps   with green t-band  PT Education - 02/27/21 1735     Education Details education on training muscle groups for endurance vs strength    Person(s) Educated Patient    Methods Explanation    Comprehension Verbalized understanding              PT Short Term Goals - 02/20/21 1629       PT SHORT TERM GOAL #1   Title Pt to increase strength 1/2 mm grade to allow pt to come sit to stand from a low lying couch without the use of her UE>    Time 2    Period Weeks    Status New    Target Date 03/06/21      PT SHORT TERM GOAL #2   Title Pt to note that she has only lost her balance one time in the past week.    Time 2    Period Weeks    Status New               PT Long Term Goals - 02/20/21 1630       PT LONG TERM GOAL #1   Title PT to be I in an advance HEP to be able to increase RT LE strength 1 grade to feel confident in rejoining her dance class.    Time 4    Period Weeks    Status New    Target  Date 03/20/21      PT LONG TERM GOAL #2   Title Pt to be able to complete 14 sit to stand in a 30 second period without feeling off balance.    Time 4    Period Weeks    Status New      PT LONG TERM GOAL #3   Title Pt to note that she has not felt off balance at all in the past week    Time 4    Period Weeks    Status New                   Plan - 02/27/21 1736     Clinical Impression Statement Demonstrates profound weakness in right hip external rotators and weakness in internal rotation as well. Right hip PROM > left hip on FABER and FADIR. Pt notes increased feeling of fatigue in her right buttock with walker several city blocks. Continued sessions indicated to improve pelvic strength and proximal stability to improve balance    Personal Factors and Comorbidities Comorbidity 2    Comorbidities OA, Rt THR    Examination-Activity Limitations Stand;Locomotion Level    Examination-Participation Restrictions Community Activity;Shop;Other    Stability/Clinical Decision Making Stable/Uncomplicated    Rehab Potential Good    PT Frequency 1x / week    PT Duration 4 weeks    PT Treatment/Interventions Patient/family education;Therapeutic exercise;Balance training    PT Next Visit Plan PT will be seen once a week check mm strength out each time and give appropriate update to HEP. Progress to vector stances, side stepping with t band, lateral step ups, lunges, lunge walking, heel toe gt...    PT Home Exercise Plan sit to stand, single leg stance, prone hip extension and step up. Seated clam shell, hip IR, sidestepping with green t-loop             Patient will benefit from skilled therapeutic intervention in order to improve the following deficits and impairments:  Abnormal gait, Decreased strength, Decreased balance  Visit Diagnosis: Muscle weakness (generalized)  Difficulty  in walking, not elsewhere classified  Pain in right hip     Problem List Patient Active  Problem List   Diagnosis Date Noted   Unsteady gait 02/03/2021   Osteopenia 02/03/2021   Acquired skin tag 10/19/2020   Leg swelling 03/31/2020   Venous insufficiency of both lower extremities 03/31/2020   Vitamin D deficiency 03/06/2020   Prediabetes 03/06/2020   Knee pain, right 04/06/2019   Left ankle sprain 04/06/2019   Personal history of colonic polyps 07/12/2018   Ingrown toenail of both feet 03/23/2017   Hip pain, chronic, right 02/03/2016   GERD (gastroesophageal reflux disease) 12/26/2014   Diverticulosis of colon without hemorrhage 07/05/2014   FH: pancreatic cancer 01/26/2014   FH: glaucoma 03/24/2013   Overweight (BMI 25.0-29.9) 03/13/2012   Hyperlipidemia LDL goal <100 04/03/2009   Essential hypertension 04/03/2009    Toniann Fail, PT 02/27/2021, 5:43 PM  [facility] [address] [place], [place], [address] Phone: [phone]   Fax:  [phone]  Name: Adrienne [name] MRN: [id number] Date of Birth: 30-Aug-1945

## 2021-02-27 NOTE — Patient Instructions (Signed)
Access Code: [email or web address] URL: [email or web address] Date: 02/27/2021 Prepared by: Sherlyn Lees  Exercises Seated Hip Abduction with Resistance - 1 x daily - 7 x weekly - 3 sets - 10 reps - 2 sec hold Seated Hip Internal Rotation with Ball and Resistance - 1 x daily - 7 x weekly - 3 sets - 10 reps - 2 sec hold Side Stepping with Resistance at Thighs - 1 x daily - 7 x weekly - 3 sets - 10 reps Sit to Stand with Resistance Around Legs - 1 x daily - 7 x weekly - 1-3 sets - 10 reps

## 2021-03-04 ENCOUNTER — Other Ambulatory Visit: Payer: Self-pay

## 2021-03-04 ENCOUNTER — Ambulatory Visit (HOSPITAL_COMMUNITY): Payer: Medicare Other | Admitting: Physical Therapy

## 2021-03-04 DIAGNOSIS — M25551 Pain in right hip: Secondary | ICD-10-CM | POA: Diagnosis not present

## 2021-03-04 DIAGNOSIS — M6281 Muscle weakness (generalized): Secondary | ICD-10-CM | POA: Diagnosis not present

## 2021-03-04 DIAGNOSIS — M25561 Pain in right knee: Secondary | ICD-10-CM

## 2021-03-04 DIAGNOSIS — G8929 Other chronic pain: Secondary | ICD-10-CM | POA: Diagnosis not present

## 2021-03-04 DIAGNOSIS — R262 Difficulty in walking, not elsewhere classified: Secondary | ICD-10-CM

## 2021-03-04 NOTE — Therapy (Signed)
[facility] [place], [place], [address] Phone: [phone]   Fax:  [phone]  Physical Therapy Treatment  [name] MRN: [id number] Date of Birth: 1946/01/17 Referring Provider (PT): Tula Nakayama   Encounter Date: 03/04/2021   PT End of Session - 03/04/21 1049     Visit Number 3    Number of Visits 4    Date for PT Re-Evaluation 03/22/21    Authorization Type medicare/BCBS    PT Start Time 1008    PT Stop Time 1049    PT Time Calculation (min) 41 min    Activity Tolerance Patient tolerated treatment well    Behavior During Therapy [facility] for tasks assessed/performed             Past Medical History:  Diagnosis Date   Allergy    Arthritis    Diverticulosis 05/2009   GERD (gastroesophageal reflux disease)    Hyperlipidemia    Hypertension     Past Surgical History:  Procedure Laterality Date   ABDOMINAL HYSTERECTOMY  1990   for fibroids ovaries remain    CHOLECYSTECTOMY  1980   COLONOSCOPY  05/2009   rare diverticula, small internal hemorrhoids. Next colonoscopy in 5-10 yrs.   COLONOSCOPY N/A 10/23/2018   Procedure: COLONOSCOPY;  Surgeon: Danie Binder, MD;  [facility];  Service: Endoscopy;  Laterality: N/A;  2:00pm   JOINT REPLACEMENT N/A    Phreesia 06/02/2020   left nipple inversion bx benign  2000   POLYPECTOMY  10/23/2018   Procedure: POLYPECTOMY;  Surgeon: Danie Binder, MD;  [facility];  Service: Endoscopy;;   TOTAL HIP ARTHROPLASTY Right 05/16/2020   Procedure: TOTAL HIP ARTHROPLASTY;  Surgeon: Earlie Server, MD;  [facility];  Service: Orthopedics;  Laterality: Right;    There were no vitals filed for this visit.   Subjective Assessment - 03/04/21 1021     Subjective pt reports complaince with HEP.  No dizziness or issues reported.  STates dizziness is "iffy" comes and goes.  Reports no pain but occasional discomfort with  certain positions.    Currently in Pain? No/denies                               [facility] Adult PT Treatment/Exercise - 03/04/21 0001       Knee/Hip Exercises: Seated   Clamshell with TheraBand Green    Other Seated Knee/Hip Exercises hip internal rotation with green t-loop    Sit to Sand 10 reps;without UE support      Knee/Hip Exercises: Supine   Bridges Both;10 reps    Bridges with Clamshell 10 reps;Limitations   with green theraband   Straight Leg Raises Both;10 reps      Knee/Hip Exercises: Prone   Hip Extension Strengthening;Both    Hip Extension Limitations cues to keep hips flat on table    Straight Leg Raises Both;10 reps    Other Prone Exercises IR/ER of hips with GTB 10X each                       PT Short Term Goals - 02/20/21 1629       PT SHORT TERM GOAL #1   Title Pt to increase strength 1/2 mm grade to allow pt to come sit to stand from a low lying couch without the use of her UE>  Time 2    Period Weeks    Status New    Target Date 03/06/21      PT SHORT TERM GOAL #2   Title Pt to note that she has only lost her balance one time in the past week.    Time 2    Period Weeks    Status New               PT Long Term Goals - 02/20/21 1630       PT LONG TERM GOAL #1   Title PT to be I in an advance HEP to be able to increase RT LE strength 1 grade to feel confident in rejoining her dance class.    Time 4    Period Weeks    Status New    Target Date 03/20/21      PT LONG TERM GOAL #2   Title Pt to be able to complete 14 sit to stand in a 30 second period without feeling off balance.    Time 4    Period Weeks    Status New      PT LONG TERM GOAL #3   Title Pt to note that she has not felt off balance at all in the past week    Time 4    Period Weeks    Status New                   Plan - 03/04/21 1357     Clinical Impression Statement Reviewed established HEP and continued with focus on  improving bil hip strength.  Instructed with additional therex in supine she can use with theraband that isolates glutes and Hip mm.   Provided with written instructions to add to HEP for these. Pt required cues for form/stabilization with most exercises.  Cues with Prone extension to keep hips on table/reducing rotation and keeping core stabilized.    Personal Factors and Comorbidities Comorbidity 2    Comorbidities OA, Rt THR    Examination-Activity Limitations Stand;Locomotion Level    Examination-Participation Restrictions Community Activity;Shop;Other    Stability/Clinical Decision Making Stable/Uncomplicated    Rehab Potential Good    PT Frequency 1x / week    PT Duration 4 weeks    PT Treatment/Interventions Patient/family education;Therapeutic exercise;Balance training    PT Next Visit Plan PT will be seen once a week check mm strength out each time and give appropriate update to HEP. Progress to vector stances, llateral step ups, lunges, lunge walking, heel toe gt...    PT Home Exercise Plan sit to stand, single leg stance, prone hip extension and step up. Seated clam shell, hip IR, sidestepping with green t-loop  11/9: prone hip IR/ER with GTB, supine isolated bridge with GTB, bridge with abd using GTB             Patient will benefit from skilled therapeutic intervention in order to improve the following deficits and impairments:  Abnormal gait, Decreased strength, Decreased balance  Visit Diagnosis: Difficulty in walking, not elsewhere classified  Muscle weakness (generalized)  Pain in right hip  Chronic pain of right knee     Problem List Patient Active Problem List   Diagnosis Date Noted   Unsteady gait 02/03/2021   Osteopenia 02/03/2021   Acquired skin tag 10/19/2020   Leg swelling 03/31/2020   Venous insufficiency of both lower extremities 03/31/2020   Vitamin D deficiency 03/06/2020   Prediabetes 03/06/2020   Knee pain, right 04/06/2019  Left ankle sprain  04/06/2019   Personal history of colonic polyps 07/12/2018   Ingrown toenail of both feet 03/23/2017   Hip pain, chronic, right 02/03/2016   GERD (gastroesophageal reflux disease) 12/26/2014   Diverticulosis of colon without hemorrhage 07/05/2014   FH: pancreatic cancer 01/26/2014   FH: glaucoma 03/24/2013   Overweight (BMI 25.0-29.9) 03/13/2012   Hyperlipidemia LDL goal <100 04/03/2009   Essential hypertension 04/03/2009   Teena Irani, PTA/CLT, [facility] [phone]  Teena Irani, PTA 03/04/2021, 2:01 PM  [facility] [address] [place], [place], [address] Phone: [phone]   Fax:  [phone]  Name: Adrienne [name] MRN: [id number] Date of Birth: 05-14-1945

## 2021-03-09 ENCOUNTER — Other Ambulatory Visit: Payer: Self-pay

## 2021-03-09 ENCOUNTER — Ambulatory Visit (HOSPITAL_COMMUNITY): Payer: Medicare Other

## 2021-03-09 ENCOUNTER — Encounter (HOSPITAL_COMMUNITY): Payer: Self-pay

## 2021-03-09 DIAGNOSIS — R262 Difficulty in walking, not elsewhere classified: Secondary | ICD-10-CM | POA: Diagnosis not present

## 2021-03-09 DIAGNOSIS — M6281 Muscle weakness (generalized): Secondary | ICD-10-CM | POA: Diagnosis not present

## 2021-03-09 DIAGNOSIS — M25561 Pain in right knee: Secondary | ICD-10-CM

## 2021-03-09 DIAGNOSIS — G8929 Other chronic pain: Secondary | ICD-10-CM | POA: Diagnosis not present

## 2021-03-09 DIAGNOSIS — M25551 Pain in right hip: Secondary | ICD-10-CM

## 2021-03-09 NOTE — Therapy (Signed)
[facility] [address] [place], [place], [address] Phone: [phone]   Fax:  [phone]  Physical Therapy Treatment and Discharge Summary  Patient Details  Name: Mekhia [name] MRN: [id number] Date of Birth: 09-25-1945 Referring Provider (PT): Tula Nakayama  PHYSICAL THERAPY DISCHARGE SUMMARY  Visits from Start of Care: 4  Current functional level related to goals / functional outcomes: Demonstrates improved status and able to meet STG/LTG   Remaining deficits: none   Education / Equipment: HEP   Patient agrees to discharge. Patient goals were met. Patient is being discharged due to meeting the stated rehab goals.  Encounter Date: 03/09/2021   PT End of Session - 03/09/21 1348     Visit Number 4    Number of Visits 4    Date for PT Re-Evaluation 03/22/21    Authorization Type medicare/BCBS    PT Start Time 1347    PT Stop Time 1430    PT Time Calculation (min) 43 min    Activity Tolerance Patient tolerated treatment well    Behavior During Therapy [facility] for tasks assessed/performed             Past Medical History:  Diagnosis Date   Allergy    Arthritis    Diverticulosis 05/2009   GERD (gastroesophageal reflux disease)    Hyperlipidemia    Hypertension     Past Surgical History:  Procedure Laterality Date   ABDOMINAL HYSTERECTOMY  1990   for fibroids ovaries remain    CHOLECYSTECTOMY  1980   COLONOSCOPY  05/2009   rare diverticula, small internal hemorrhoids. Next colonoscopy in 5-10 yrs.   COLONOSCOPY N/A 10/23/2018   Procedure: COLONOSCOPY;  Surgeon: Danie Binder, MD;  [facility];  Service: Endoscopy;  Laterality: N/A;  2:00pm   JOINT REPLACEMENT N/A    [employer] 06/02/2020   left nipple inversion bx benign  2000   POLYPECTOMY  10/23/2018   Procedure: POLYPECTOMY;  Surgeon: Danie Binder, MD;  Location: [facility];  Service: Endoscopy;;   TOTAL HIP ARTHROPLASTY Right 05/16/2020    Procedure: TOTAL HIP ARTHROPLASTY;  Surgeon: Earlie Server, MD;  Location: [facility];  Service: Orthopedics;  Laterality: Right;    There were no vitals filed for this visit.   Subjective Assessment - 03/09/21 1351     Subjective Feeling some improvement in strengthening in the hip    Currently in Pain? No/denies                [facility] PT Assessment - 03/09/21 0001       Assessment   Medical Diagnosis Gait imbalance      Strength   Right Hip External Rotation  3+/5    Right Hip Internal Rotation 3+/5    Left Hip Flexion 5/5    Left Hip Extension 4-/5    Left Hip External Rotation 4/5    Left Hip Internal Rotation 4/5                           [facility] Adult PT Treatment/Exercise - 03/09/21 0001       Transfers   Comments 30 second chair rise: 9 reps      Knee/Hip Exercises: Seated   Clamshell with TheraBand Green    Other Seated Knee/Hip Exercises hip internal rotation with green t-loop      Knee/Hip Exercises: Sidelying   Clams green 2x10  PT Short Term Goals - 03/09/21 1418       PT SHORT TERM GOAL #1   Title Pt to increase strength 1/2 mm grade to allow pt to come sit to stand from a low lying couch without the use of her UE>    Baseline 3+/5 right hip external rotators, 4/5 right hip abduction    Time 2    Period Weeks    Status Achieved    Target Date 03/06/21      PT SHORT TERM GOAL #2   Title Pt to note that she has only lost her balance one time in the past week.    Baseline one instance of tripping on right foot with onset of fatigue    Time 2    Period Weeks    Status Achieved               PT Long Term Goals - 03/09/21 1355       PT LONG TERM GOAL #1   Title PT to be I in an advance HEP to be able to increase RT LE strength 1 grade to feel confident in rejoining her dance class.    Baseline independent    Time 4    Period Weeks    Status Achieved      PT LONG TERM GOAL #2   Title  Pt to be able to complete 14 sit to stand in a 30 second period without feeling off balance.    Baseline 9 reps    Time 4    Period Weeks    Status Not Met      PT LONG TERM GOAL #3   Title Pt to note that she has not felt off balance at all in the past week    Baseline no issue    Time 4    Period Weeks    Status Achieved                   Plan - 03/09/21 1453     Clinical Impression Statement Pt able to meet STG/LTG and demonstrates improved strength/recruitment in right hip musculature and reports decrease discomfort when sitting and notes less instances of balance/instability when walking.  Pt notes that her balance deficits tend to manifest with onset of fatigue.  Pt able to meet STG/LTG and recommend continued HEP for focused strength exercises and to resume activities such as group exercise, tai chai, etc.    Personal Factors and Comorbidities Comorbidity 2    Comorbidities OA, Rt THR    Examination-Activity Limitations Stand;Locomotion Level    Examination-Participation Restrictions Community Activity;Shop;Other    Stability/Clinical Decision Making Stable/Uncomplicated    Rehab Potential Good    PT Frequency 1x / week    PT Duration 4 weeks    PT Treatment/Interventions Patient/family education;Therapeutic exercise;Balance training    PT Next Visit Plan PT will be seen once a week check mm strength out each time and give appropriate update to HEP. Progress to vector stances, llateral step ups, lunges, lunge walking, heel toe gt...    PT Home Exercise Plan sit to stand, single leg stance, prone hip extension and step up. Seated clam shell, hip IR, sidestepping with green t-loop  11/9: prone hip IR/ER with GTB, supine isolated bridge with GTB, bridge with abd using GTB             Patient will benefit from skilled therapeutic intervention in order to improve the following deficits and impairments:  Abnormal gait, Decreased strength, Decreased balance  Visit  Diagnosis: Difficulty in walking, not elsewhere classified  Muscle weakness (generalized)  Pain in right hip  Chronic pain of right knee     Problem List Patient Active Problem List   Diagnosis Date Noted   Unsteady gait 02/03/2021   Osteopenia 02/03/2021   Acquired skin tag 10/19/2020   Leg swelling 03/31/2020   Venous insufficiency of both lower extremities 03/31/2020   Vitamin D deficiency 03/06/2020   Prediabetes 03/06/2020   Knee pain, right 04/06/2019   Left ankle sprain 04/06/2019   Personal history of colonic polyps 07/12/2018   Ingrown toenail of both feet 03/23/2017   Hip pain, chronic, right 02/03/2016   GERD (gastroesophageal reflux disease) 12/26/2014   Diverticulosis of colon without hemorrhage 07/05/2014   FH: pancreatic cancer 01/26/2014   FH: glaucoma 03/24/2013   Overweight (BMI 25.0-29.9) 03/13/2012   Hyperlipidemia LDL goal <100 04/03/2009   Essential hypertension 04/03/2009    Toniann Fail, PT 03/09/2021, 2:54 PM  [facility] [address] [place], [place], [address] Phone: [phone]   Fax:  [phone]  Name: Adrienne [name] MRN: [id number] Date of Birth: [birth year]

## 2021-03-17 ENCOUNTER — Encounter (HOSPITAL_COMMUNITY): Payer: Medicare Other | Admitting: Physical Therapy

## 2021-03-24 ENCOUNTER — Encounter (HOSPITAL_COMMUNITY): Payer: Medicare Other

## 2021-04-06 DIAGNOSIS — H401131 Primary open-angle glaucoma, bilateral, mild stage: Secondary | ICD-10-CM | POA: Diagnosis not present

## 2021-06-01 ENCOUNTER — Other Ambulatory Visit: Payer: Self-pay | Admitting: Family Medicine

## 2021-07-03 ENCOUNTER — Telehealth: Payer: Self-pay | Admitting: *Deleted

## 2021-07-03 NOTE — Chronic Care Management (AMB) (Signed)
?  Care Management  ? ?Outreach Note ? ?07/03/2021 ?Name: Adrienne [name] MRN: [id number] DOB: May 14, 1945 ? ?Referred by: Fayrene Helper, MD ?Reason for referral : Care Coordination (Initial outreach to schedule with RNCM ) ? ? ?An unsuccessful telephone outreach was attempted today. The patient was referred to the case management team for assistance with care management and care coordination.  ? ?Follow Up Plan:  ?A HIPAA compliant phone message was left for the patient providing contact information and requesting a return call.  ?The care management team will reach out to the patient again over the next 7 days.  ?If patient returns call to provider office, please advise to call [place]* at [phone]* ? ?Laverda Sorenson  ?Care Guide, Embedded Care Coordination ?[facility]  Care Management  ?Direct Dial: [phone] ? ?

## 2021-07-03 NOTE — Chronic Care Management (AMB) (Signed)
?  Care Management  ? ?Note ? ?07/03/2021 ?Name: Adrienne [name] MRN: [id number] DOB: 1945-08-27 ? ?Adrienne [name] is a 76 y.o. year old female who is a primary care patient of Moshe Cipro Norwood Levo, MD. I reached out to Lottie Mussel by phone today in response to a referral sent by Ms. Brady Auriemma's primary care provider.  ? ?Ms. Runde was given information about care management services today including:  ?Care management services include personalized support from designated clinical staff supervised by her physician, including individualized plan of care and coordination with other care providers ?24/7 contact phone numbers for assistance for urgent and routine care needs. ?The patient may stop care management services at any time by phone call to the office staff. ? ?Patient agreed to services and verbal consent obtained.  ? ?Follow up plan: ?Telephone appointment with care management team member scheduled for:07/14/21 ? ?Laverda Sorenson  ?Care Guide, Embedded Care Coordination ?[facility]  Care Management  ?Direct Dial: [phone] ? ?

## 2021-07-14 ENCOUNTER — Ambulatory Visit (INDEPENDENT_AMBULATORY_CARE_PROVIDER_SITE_OTHER): Payer: Medicare Other | Admitting: *Deleted

## 2021-07-14 DIAGNOSIS — I10 Essential (primary) hypertension: Secondary | ICD-10-CM

## 2021-07-14 DIAGNOSIS — E785 Hyperlipidemia, unspecified: Secondary | ICD-10-CM

## 2021-07-14 NOTE — Chronic Care Management (AMB) (Signed)
?Chronic Care Management  ? ?CCM RN Visit Note ? ?07/14/2021 ?Name: Adrienne [name] MRN: [id number] DOB: 1945/05/22 ? ?Subjective: ?Adrienne [name] is a 76 y.o. year old female who is a primary care patient of Fayrene Helper, MD. The care management team was consulted for assistance with disease management and care coordination needs.   ? ?Engaged with patient by telephone for initial visit in response to provider referral for case management and/or care coordination services.  ? ?Consent to Services:  ?The patient was given the following information about Chronic Care Management services today, agreed to services, and gave verbal consent: 1. CCM service includes personalized support from designated clinical staff supervised by the primary care provider, including individualized plan of care and coordination with other care providers 2. 24/7 contact phone numbers for assistance for urgent and routine care needs. 3. Service will only be billed when office clinical staff spend 20 minutes or more in a month to coordinate care. 4. Only one practitioner may furnish and bill the service in a calendar month. 5.The patient may stop CCM services at any time (effective at the end of the month) by phone call to the office staff. 6. The patient will be responsible for cost sharing (co-pay) of up to 20% of the service fee (after annual deductible is met). Patient agreed to services and consent obtained. ? ?Patient agreed to services and verbal consent obtained.  ? ?Assessment: Review of patient past medical history, allergies, medications, health status, including review of consultants reports, laboratory and other test data, was performed as part of comprehensive evaluation and provision of chronic care management services.  ? ?SDOH (Social Determinants of Health) assessments and interventions performed:  ?SDOH Interventions   ? ?Flowsheet Row Most Recent Value  ?SDOH Interventions   ?Food Insecurity Interventions Intervention  Not Indicated  ?Transportation Interventions Intervention Not Indicated  ? ?  ?  ? ?[place] ? ?Allergies  ?Allergen Reactions  ? Valsartan Shortness Of Breath  ? Ace Inhibitors Cough  ? ? ?Outpatient Encounter Medications as of 07/14/2021  ?Medication Sig Note  ? acetaminophen (TYLENOL) 650 MG CR tablet Take 2 tablets (1,300 mg total) by mouth daily as needed for pain.   ? amLODipine (NORVASC) 2.5 MG tablet TAKE 1 TABLET (2.5 MG TOTAL) BY MOUTH DAILY.   ? Biotin 10 MG CAPS Take 10 mg by mouth every evening.   ? loratadine (CLARITIN) 10 MG tablet Take 10 mg by mouth daily as needed for allergies.   ? Multiple Minerals-Vitamins (CAL MAG ZINC +D3 PO) Take 1 tablet by mouth every evening.   ? Multiple Vitamin (MULTIVITAMIN WITH MINERALS) TABS tablet Take 1 tablet by mouth every evening. Women's Multivitamin 50+   ? triamterene-hydrochlorothiazide (MAXZIDE-25) 37.5-25 MG tablet TAKE 1 TABLET EVERY DAY   ? Lidocaine-Menthol (ICY HOT LIDOCAINE PLUS MENTHOL EX) Apply 1 application topically 4 (four) times daily as needed (pain.). Icy Hot Cream with Lidocaine plus Menthol Cream (Patient not taking: Reported on 07/14/2021) 05/01/2020: Patient alternates between [place], [facility] with Lidocaine, Ice Blue, Blue-Emu, & Biofreeze pain relief creams ?  ? Liniments (BLUE-EMU SUPER STRENGTH EX) Apply 1 application topically 4 (four) times daily as needed (pain.). (Patient not taking: Reported on 07/14/2021) 05/01/2020: Patient alternates between [place], [facility] with Lidocaine, Ice Blue, Blue-Emu, & Biofreeze pain relief creams ?  ? Liniments (PAIN RELIEF EX) Apply 1 application topically 4 (four) times daily as needed (pain.). Triderma Pain Relief Cream (Patient not taking: Reported on 07/14/2021) 05/01/2020:  Patient alternates between [place], [facility] with Lidocaine, Ice Blue, Blue-Emu, & Biofreeze pain relief creams ?  ? Menthol, Topical Analgesic, (ICE BLUE EX) Apply 1 application topically 4 (four) times daily as needed (pain.).  (Patient not taking: Reported on 07/14/2021) 05/01/2020: Patient alternates between [place], [facility] with Lidocaine, Ice Blue, Blue-Emu, & Biofreeze pain relief creams ?  ? Menthol-Methyl Salicylate (MUSCLE RUB) 10-15 % CREA Apply 1 application topically as needed for muscle pain. (Patient not taking: Reported on 07/14/2021)   ? TURMERIC PO Take 450 mg by mouth every evening. (Patient not taking: Reported on 07/14/2021) 05/01/2020: On hold until patient purchases more  ? ?No facility-administered encounter medications on file as of 07/14/2021.  ? ? ?Patient Active Problem List  ? Diagnosis Date Noted  ? Unsteady gait 02/03/2021  ? Osteopenia 02/03/2021  ? Acquired skin tag 10/19/2020  ? Leg swelling 03/31/2020  ? Venous insufficiency of both lower extremities 03/31/2020  ? Vitamin D deficiency 03/06/2020  ? Prediabetes 03/06/2020  ? Knee pain, right 04/06/2019  ? Left ankle sprain 04/06/2019  ? Personal history of colonic polyps 07/12/2018  ? Ingrown toenail of both feet 03/23/2017  ? Hip pain, chronic, right 02/03/2016  ? GERD (gastroesophageal reflux disease) 12/26/2014  ? Diverticulosis of colon without hemorrhage 07/05/2014  ? FH: pancreatic cancer 01/26/2014  ? FH: glaucoma 03/24/2013  ? Overweight (BMI 25.0-29.9) 03/13/2012  ? Hyperlipidemia LDL goal <100 04/03/2009  ? Essential hypertension 04/03/2009  ? ? ?Conditions to be addressed/monitored:HTN and HLD ? ?Care Plan : RN Care Manager Plan of Care  ?Updates made by Kassie Mends, RN since 07/14/2021 12:00 AM  ?  ? ?Problem: No plan of care established for management of chronic disease state  (HTN, HLD)   ?Priority: High  ?  ? ?Goal: Development of plan of care for chronic disease management (HTN, HLD)   ?Start Date: 07/14/2021  ?Expected End Date: 01/10/2022  ?Priority: High  ?Note:   ?Current Barriers:  ?Knowledge Deficits related to plan of care for management of HTN and HLD  ?Patient reports she lives alone, is independent in all aspects of her care, continues to  drive, has a sister nearby she can call on if needed. Patient reports she tries to eat as healthy as possible, has new blood pressure cuff and plans to start checking blood pressure and keeping a log. ? ?RNCM Clinical Goal(s):  ?Patient will verbalize understanding of plan for management of HTN and HLD as evidenced by patient report, review of EHR and  through collaboration with RN Care manager, provider, and care team.  ? ?Interventions: ?1:1 collaboration with primary care provider regarding development and update of comprehensive plan of care as evidenced by provider attestation and co-signature ?Inter-disciplinary care team collaboration (see longitudinal plan of care) ?Evaluation of current treatment plan related to  self management and patient's adherence to plan as established by provider ? ? ?Hyperlipidemia:  (Status: New goal. Goal on Track (progressing): YES.) Long Term Goal  ?Lab Results  ?Component Value Date  ? CHOL 242 (H) 02/02/2021  ? HDL 95 02/02/2021  ? LDLCALC 132 (H) 02/02/2021  ? TRIG 89 02/02/2021  ? CHOLHDL 2.5 02/02/2021  ?  ? ?Medication review performed; medication list updated in electronic medical record.  ?Provided HLD educational materials; ?Reviewed importance of limiting foods high in cholesterol; ?Reviewed exercise goals and target of 150 minutes per week; ?Screening for signs and symptoms of depression related to chronic disease state;  ?Assessed social determinant  of health barriers;  ?Pain assessment completed ?Education provided via My Chart- heart healthy diet ? ?Hypertension Interventions:  (Status:  New goal. and Goal on track:  Yes.) Long Term Goal ?Last practice recorded BP readings:  ?BP Readings from Last 3 Encounters:  ?02/03/21 123/73  ?11/04/20 121/81  ?10/16/20 124/75  ?Most recent eGFR/CrCl:  ?Lab Results  ?Component Value Date  ? EGFR 63 02/02/2021  ?  No components found for: CRCL ? ?Evaluation of current treatment plan related to hypertension self management and  patient's adherence to plan as established by provider ?Reviewed medications with patient and discussed importance of compliance ?Counseled on the importance of exercise goals with target of 150 minut

## 2021-07-14 NOTE — Patient Instructions (Signed)
Visit Information   Thank you for taking time to visit with me today. Please don't hesitate to contact me if I can be of assistance to you before our next scheduled telephone appointment.  Following are the goals we discussed today:  Take medications as prescribed   Attend all scheduled provider appointments Call pharmacy for medication refills 3-7 days in advance of running out of medications Attend church or other social activities Perform all self care activities independently  Perform IADL's (shopping, preparing meals, housekeeping, managing finances) independently Call provider office for new concerns or questions  check blood pressure 3 times per week write blood pressure results in a log or diary take blood pressure log to all doctor appointments call doctor for signs and symptoms of high blood pressure keep all doctor appointments take medications for blood pressure exactly as prescribed eat more whole grains, fruits and vegetables, lean meats and healthy fats call doctor when you experience any new symptoms adhere to prescribed diet: heart healthy, low sodium Bake or broil foods instead of frying Avoid trans/ saturated fats Look over education sent via My Chart- low sodium and heart healthy diet  Our next appointment is by telephone on 10/06/21 at 9 am  Please call the care guide team at [phone] if you need to cancel or reschedule your appointment.   If you are experiencing a Mental Health or [place] or need someone to talk to, please call the Suicide and Crisis Lifeline: 988 call the [place] National Suicide Prevention Lifeline: 430-722-0072 or TTY: 612-804-0016 TTY 978 062 2358) to talk to a trained counselor call 1-800-273-TALK (toll free, 24 hour hotline) go to [facility] [place] [phone]) call the [place]: [phone] call 911   Following is a copy of your full  care plan:  Care Plan : RN Care Manager Plan of Care  Updates made by Kassie Mends, RN since 07/14/2021 12:00 AM     Problem: No plan of care established for management of chronic disease state  (HTN, HLD)   Priority: High     Goal: Development of plan of care for chronic disease management (HTN, HLD)   Start Date: 07/14/2021  Expected End Date: 01/10/2022  Priority: High  Note:   Current Barriers:  Knowledge Deficits related to plan of care for management of HTN and HLD  Patient reports she lives alone, is independent in all aspects of her care, continues to drive, has a sister nearby she can call on if needed. Patient reports she tries to eat as healthy as possible, has new blood pressure cuff and plans to start checking blood pressure and keeping a log.  RNCM Clinical Goal(s):  Patient will verbalize understanding of plan for management of HTN and HLD as evidenced by patient report, review of EHR and  through collaboration with RN Care manager, provider, and care team.   Interventions: 1:1 collaboration with primary care provider regarding development and update of comprehensive plan of care as evidenced by provider attestation and co-signature Inter-disciplinary care team collaboration (see longitudinal plan of care) Evaluation of current treatment plan related to  self management and patient's adherence to plan as established by provider   Hyperlipidemia:  (Status: New goal. Goal on Track (progressing): YES.) Long Term Goal  Lab Results  Component Value Date   CHOL 242 (H) 02/02/2021   HDL 95 02/02/2021   LDLCALC 132 (H) 02/02/2021   TRIG 89 02/02/2021   CHOLHDL 2.5 02/02/2021  Medication review performed; medication list updated in electronic medical record.  Provided HLD educational materials; Reviewed importance of limiting foods high in cholesterol; Reviewed exercise goals and target of 150 minutes per week; Screening for signs and symptoms of depression related  to chronic disease state;  Assessed social determinant of health barriers;  Pain assessment completed Education provided via My Chart- heart healthy diet  Hypertension Interventions:  (Status:  New goal. and Goal on track:  Yes.) Long Term Goal Last practice recorded BP readings:  BP Readings from Last 3 Encounters:  02/03/21 123/73  11/04/20 121/81  10/16/20 124/75  Most recent eGFR/CrCl:  Lab Results  Component Value Date   EGFR 63 02/02/2021    No components found for: CRCL  Evaluation of current treatment plan related to hypertension self management and patient's adherence to plan as established by provider Reviewed medications with patient and discussed importance of compliance Counseled on the importance of exercise goals with target of 150 minutes per week Discussed plans with patient for ongoing care management follow up and provided patient with direct contact information for care management team Discussed complications of poorly controlled blood pressure such as heart disease, stroke, circulatory complications, vision complications, kidney impairment, sexual dysfunction  Reviewed importance of adhering to low sodium diet Education provided via My Chart- low sodium diet  Patient Goals/Self-Care Activities: Take medications as prescribed   Attend all scheduled provider appointments Call pharmacy for medication refills 3-7 days in advance of running out of medications Attend church or other social activities Perform all self care activities independently  Perform IADL's (shopping, preparing meals, housekeeping, managing finances) independently Call provider office for new concerns or questions  check blood pressure 3 times per week write blood pressure results in a log or diary take blood pressure log to all doctor appointments call doctor for signs and symptoms of high blood pressure keep all doctor appointments take medications for blood pressure exactly as  prescribed eat more whole grains, fruits and vegetables, lean meats and healthy fats - call doctor when you experience any new symptoms - adhere to prescribed diet: heart healthy, low sodium Bake or broil foods instead of frying Avoid trans/ saturated fats Look over education sent via My Chart- low sodium and heart healthy diet       Consent to CCM Services: Ms. Hauschild was given information about Chronic Care Management services including:  CCM service includes personalized support from designated clinical staff supervised by her physician, including individualized plan of care and coordination with other care providers 24/7 contact phone numbers for assistance for urgent and routine care needs. Service will only be billed when office clinical staff spend 20 minutes or more in a month to coordinate care. Only one practitioner may furnish and bill the service in a calendar month. The patient may stop CCM services at any time (effective at the end of the month) by phone call to the office staff. The patient will be responsible for cost sharing (co-pay) of up to 20% of the service fee (after annual deductible is met).  Patient agreed to services and verbal consent obtained.   Patient verbalizes understanding of instructions and care plan provided today and agrees to view in [facility]. Active MyChart status confirmed with patient.    Telephone follow up appointment with care management team member scheduled for: 10/06/21  Heart-Healthy Eating Plan Heart-healthy meal planning includes: Eating less unhealthy fats. Eating more healthy fats. Making other changes in your diet. Talk with your doctor or  a diet specialist (dietitian) to create an eating plan that is right for you. What is my plan? Your doctor may recommend an eating plan that includes: Total fat: ______% or less of total calories a day. Saturated fat: ______% or less of total calories a day. Cholesterol: less than _________mg a  day. What are tips for following this plan? Cooking Avoid frying your food. Try to bake, boil, grill, or broil it instead. You can also reduce fat by: Removing the skin from poultry. Removing all visible fats from meats. Steaming vegetables in water or broth. Meal planning  At meals, divide your plate into four equal parts: Fill one-half of your plate with vegetables and green salads. Fill one-fourth of your plate with whole grains. Fill one-fourth of your plate with lean protein foods. Eat 4-5 servings of vegetables per day. A serving of vegetables is: 1 cup of raw or cooked vegetables. 2 cups of raw leafy greens. Eat 4-5 servings of fruit per day. A serving of fruit is: 1 medium whole fruit.  cup of dried fruit.  cup of fresh, frozen, or canned fruit.  cup of 100% fruit juice. Eat more foods that have soluble fiber. These are apples, broccoli, carrots, beans, peas, and barley. Try to get 20-30 g of fiber per day. Eat 4-5 servings of nuts, legumes, and seeds per week: 1 serving of dried beans or legumes equals  cup after being cooked. 1 serving of nuts is  cup. 1 serving of seeds equals 1 tablespoon. General information Eat more home-cooked food. Eat less restaurant, buffet, and fast food. Limit or avoid alcohol. Limit foods that are high in starch and sugar. Avoid fried foods. Lose weight if you are overweight. Keep track of how much salt (sodium) you eat. This is important if you have high blood pressure. Ask your doctor to tell you more about this. Try to add vegetarian meals each week. Fats Choose healthy fats. These include olive oil and canola oil, flaxseeds, walnuts, almonds, and seeds. Eat more omega-3 fats. These include salmon, mackerel, sardines, tuna, flaxseed oil, and ground flaxseeds. Try to eat fish at least 2 times each week. Check food labels. Avoid foods with trans fats or high amounts of saturated fat. Limit saturated fats. These are often found in  animal products, such as meats, butter, and cream. These are also found in plant foods, such as palm oil, palm kernel oil, and coconut oil. Avoid foods with partially hydrogenated oils in them. These have trans fats. Examples are stick margarine, some tub margarines, cookies, crackers, and other baked goods. What foods can I eat? Fruits All fresh, canned (in natural juice), or frozen fruits. Vegetables Fresh or frozen vegetables (raw, steamed, roasted, or grilled). Green salads. Grains Most grains. Choose whole wheat and whole grains most of the time. Rice and pasta, including brown rice and pastas made with whole wheat. Meats and other proteins Lean, well-trimmed beef, veal, pork, and lamb. Chicken and [place] without skin. All fish and shellfish. Wild duck, rabbit, pheasant, and venison. Egg whites or low-cholesterol egg substitutes. Dried beans, peas, lentils, and tofu. Seeds and most nuts. Dairy Low-fat or nonfat cheeses, including ricotta and mozzarella. Skim or 1% milk that is liquid, powdered, or evaporated. Buttermilk that is made with low-fat milk. Nonfat or low-fat yogurt. Fats and oils Non-hydrogenated (trans-free) margarines. Vegetable oils, including soybean, sesame, sunflower, olive, peanut, safflower, corn, canola, and cottonseed. Salad dressings or mayonnaise made with a vegetable oil. Beverages Mineral water. Coffee and tea.  Diet carbonated beverages. Sweets and desserts Sherbet, gelatin, and fruit ice. Small amounts of dark chocolate. Limit all sweets and desserts. Seasonings and condiments All seasonings and condiments. The items listed above may not be a complete list of foods and drinks you can eat. Contact a dietitian for more options. What foods should I avoid? Fruits Canned fruit in heavy syrup. Fruit in cream or butter sauce. Fried fruit. Limit coconut. Vegetables Vegetables cooked in cheese, cream, or butter sauce. Fried vegetables. Grains Breads that are made  with saturated or trans fats, oils, or whole milk. Croissants. Sweet rolls. Donuts. High-fat crackers, such as cheese crackers. Meats and other proteins Fatty meats, such as hot dogs, ribs, sausage, bacon, rib-eye roast or steak. High-fat deli meats, such as salami and bologna. Caviar. Domestic duck and goose. Organ meats, such as liver. Dairy Cream, sour cream, cream cheese, and creamed cottage cheese. Whole-milk cheeses. Whole or 2% milk that is liquid, evaporated, or condensed. Whole buttermilk. Cream sauce or high-fat cheese sauce. Yogurt that is made from whole milk. Fats and oils Meat fat, or shortening. Cocoa butter, hydrogenated oils, palm oil, coconut oil, palm kernel oil. Solid fats and shortenings, including bacon fat, salt pork, lard, and butter. Nondairy cream substitutes. Salad dressings with cheese or sour cream. Beverages Regular sodas and juice drinks with added sugar. Sweets and desserts Frosting. Pudding. Cookies. Cakes. Pies. Milk chocolate or white chocolate. Buttered syrups. Full-fat ice cream or ice cream drinks. The items listed above may not be a complete list of foods and drinks to avoid. Contact a dietitian for more information. Summary Heart-healthy meal planning includes eating less unhealthy fats, eating more healthy fats, and making other changes in your diet. Eat a balanced diet. This includes fruits and vegetables, low-fat or nonfat dairy, lean protein, nuts and legumes, whole grains, and heart-healthy oils and fats. This information is not intended to replace advice given to you by your health care provider. Make sure you discuss any questions you have with your health care provider. Document Revised: 08/21/2020 Document Reviewed: 08/21/2020 Elsevier Patient Education  2022 [employer]. Low-Sodium Eating Plan Sodium, which is an element that makes up salt, helps you maintain a healthy balance of fluids in your body. Too much sodium can increase your blood  pressure and cause fluid and waste to be held in your body. Your health care provider or dietitian may recommend following this plan if you have high blood pressure (hypertension), kidney disease, liver disease, or heart failure. Eating less sodium can help lower your blood pressure, reduce swelling, and protect your heart, liver, and kidneys. What are tips for following this plan? Reading food labels The Nutrition Facts label lists the amount of sodium in one serving of the food. If you eat more than one serving, you must multiply the listed amount of sodium by the number of servings. Choose foods with less than 140 mg of sodium per serving. Avoid foods with 300 mg of sodium or more per serving. Shopping  Look for lower-sodium products, often labeled as "low-sodium" or "no salt added." Always check the sodium content, even if foods are labeled as "unsalted" or "no salt added." Buy fresh foods. Avoid canned foods and pre-made or frozen meals. Avoid canned, cured, or processed meats. Buy breads that have less than 80 mg of sodium per slice. Cooking  Eat more home-cooked food and less restaurant, buffet, and fast food. Avoid adding salt when cooking. Use salt-free seasonings or herbs instead of table salt or  sea salt. Check with your health care provider or pharmacist before using salt substitutes. Cook with plant-based oils, such as canola, sunflower, or olive oil. Meal planning When eating at a restaurant, ask that your food be prepared with less salt or no salt, if possible. Avoid dishes labeled as brined, pickled, cured, smoked, or made with soy sauce, miso, or teriyaki sauce. Avoid foods that contain MSG (monosodium glutamate). MSG is sometimes added to [place] food, bouillon, and some canned foods. Make meals that can be grilled, baked, poached, roasted, or steamed. These are generally made with less sodium. General information Most people on this plan should limit their sodium intake to  1,500-2,000 mg (milligrams) of sodium each day. What foods should I eat? Fruits Fresh, frozen, or canned fruit. Fruit juice. Vegetables Fresh or frozen vegetables. "No salt added" canned vegetables. "No salt added" tomato sauce and paste. Low-sodium or reduced-sodium tomato and vegetable juice. Grains Low-sodium cereals, including oats, puffed wheat and rice, and shredded wheat. Low-sodium crackers. Unsalted rice. Unsalted pasta. Low-sodium bread. Whole-grain breads and whole-grain pasta. Meats and other proteins Fresh or frozen (no salt added) meat, poultry, seafood, and fish. Low-sodium canned tuna and salmon. Unsalted nuts. Dried peas, beans, and lentils without added salt. Unsalted canned beans. Eggs. Unsalted nut butters. Dairy Milk. Soy milk. Cheese that is naturally low in sodium, such as ricotta cheese, fresh mozzarella, or Swiss cheese. Low-sodium or reduced-sodium cheese. Cream cheese. Yogurt. Seasonings and condiments Fresh and dried herbs and spices. Salt-free seasonings. Low-sodium mustard and ketchup. Sodium-free salad dressing. Sodium-free light mayonnaise. Fresh or refrigerated horseradish. Lemon juice. Vinegar. Other foods Homemade, reduced-sodium, or low-sodium soups. Unsalted popcorn and pretzels. Low-salt or salt-free chips. The items listed above may not be a complete list of foods and beverages you can eat. Contact a dietitian for more information. What foods should I avoid? Vegetables Sauerkraut, pickled vegetables, and relishes. Olives. [place] fries. Onion rings. Regular canned vegetables (not low-sodium or reduced-sodium). Regular canned tomato sauce and paste (not low-sodium or reduced-sodium). Regular tomato and vegetable juice (not low-sodium or reduced-sodium). Frozen vegetables in sauces. Grains Instant hot cereals. Bread stuffing, pancake, and biscuit mixes. Croutons. Seasoned rice or pasta mixes. Noodle soup cups. Boxed or frozen macaroni and cheese. Regular  salted crackers. Self-rising flour. Meats and other proteins Meat or fish that is salted, canned, smoked, spiced, or pickled. Precooked or cured meat, such as sausages or meat loaves. [place] Salines. Ham. Pepperoni. Hot dogs. Corned beef. Chipped beef. Salt pork. Jerky. Pickled herring. Anchovies and sardines. Regular canned tuna. Salted nuts. Dairy Processed cheese and cheese spreads. Hard cheeses. Cheese curds. Blue cheese. Feta cheese. String cheese. Regular cottage cheese. Buttermilk. Canned milk. Fats and oils Salted butter. Regular margarine. Ghee. Bacon fat. Seasonings and condiments Onion salt, garlic salt, seasoned salt, table salt, and sea salt. Canned and packaged gravies. Worcestershire sauce. Tartar sauce. Barbecue sauce. Teriyaki sauce. Soy sauce, including reduced-sodium. Steak sauce. Fish sauce. Oyster sauce. Cocktail sauce. Horseradish that you find on the shelf. Regular ketchup and mustard. Meat flavorings and tenderizers. Bouillon cubes. Hot sauce. Pre-made or packaged marinades. Pre-made or packaged taco seasonings. Relishes. Regular salad dressings. Salsa. Other foods Salted popcorn and pretzels. Corn chips and puffs. Potato and tortilla chips. Canned or dried soups. Pizza. Frozen entrees and pot pies. The items listed above may not be a complete list of foods and beverages you should avoid. Contact a dietitian for more information. Summary Eating less sodium can help lower your blood pressure, reduce swelling, and  protect your heart, liver, and kidneys. Most people on this plan should limit their sodium intake to 1,500-2,000 mg (milligrams) of sodium each day. Canned, boxed, and frozen foods are high in sodium. Restaurant foods, fast foods, and pizza are also very high in sodium. You also get sodium by adding salt to food. Try to cook at home, eat more fresh fruits and vegetables, and eat less fast food and canned, processed, or prepared foods. This information is not intended to  replace advice given to you by your health care provider. Make sure you discuss any questions you have with your health care provider. Document Revised: 05/18/2019 Document Reviewed: 03/14/2019 Elsevier Patient Education  2022 [employer].

## 2021-07-24 DIAGNOSIS — I10 Essential (primary) hypertension: Secondary | ICD-10-CM | POA: Diagnosis not present

## 2021-07-24 DIAGNOSIS — E785 Hyperlipidemia, unspecified: Secondary | ICD-10-CM | POA: Diagnosis not present

## 2021-08-03 DIAGNOSIS — E785 Hyperlipidemia, unspecified: Secondary | ICD-10-CM | POA: Diagnosis not present

## 2021-08-03 DIAGNOSIS — R7303 Prediabetes: Secondary | ICD-10-CM | POA: Diagnosis not present

## 2021-08-04 LAB — LIPID PANEL
Chol/HDL Ratio: 2.3 ratio (ref 0.0–4.4)
Cholesterol, Total: 246 mg/dL — ABNORMAL HIGH (ref 100–199)
HDL: 108 mg/dL (ref >39)
LDL Chol Calc (NIH): 126 mg/dL — ABNORMAL HIGH (ref 0–99)
Triglycerides: 75 mg/dL (ref 0–149)
VLDL Cholesterol Cal: 12 mg/dL (ref 5–40)

## 2021-08-04 LAB — BMP8+EGFR
BUN/Creatinine Ratio: 14 (ref 12–28)
BUN: 14 mg/dL (ref 8–27)
CO2: 26 mmol/L (ref 20–29)
Calcium: 10.5 mg/dL — ABNORMAL HIGH (ref 8.7–10.3)
Chloride: 101 mmol/L (ref 96–106)
Creatinine, Ser: 1.03 mg/dL — ABNORMAL HIGH (ref 0.57–1.00)
Glucose: 79 mg/dL (ref 70–99)
Potassium: 5.1 mmol/L (ref 3.5–5.2)
Sodium: 141 mmol/L (ref 134–144)
eGFR: 57 mL/min/{1.73_m2} — ABNORMAL LOW (ref >59)

## 2021-08-04 LAB — HEMOGLOBIN A1C
Est. average glucose Bld gHb Est-mCnc: 120 mg/dL
Hgb A1c MFr Bld: 5.8 % — ABNORMAL HIGH (ref 4.8–5.6)

## 2021-08-05 ENCOUNTER — Ambulatory Visit (INDEPENDENT_AMBULATORY_CARE_PROVIDER_SITE_OTHER): Payer: Medicare Other | Admitting: Family Medicine

## 2021-08-05 ENCOUNTER — Encounter: Payer: Self-pay | Admitting: Family Medicine

## 2021-08-05 VITALS — BP 120/81 | HR 63 | Ht 65.0 in | Wt 164.1 lb

## 2021-08-05 DIAGNOSIS — L720 Epidermal cyst: Secondary | ICD-10-CM

## 2021-08-05 DIAGNOSIS — E663 Overweight: Secondary | ICD-10-CM

## 2021-08-05 DIAGNOSIS — R519 Headache, unspecified: Secondary | ICD-10-CM | POA: Insufficient documentation

## 2021-08-05 DIAGNOSIS — E785 Hyperlipidemia, unspecified: Secondary | ICD-10-CM | POA: Diagnosis not present

## 2021-08-05 DIAGNOSIS — R7303 Prediabetes: Secondary | ICD-10-CM | POA: Diagnosis not present

## 2021-08-05 DIAGNOSIS — Z1231 Encounter for screening mammogram for malignant neoplasm of breast: Secondary | ICD-10-CM

## 2021-08-05 DIAGNOSIS — L918 Other hypertrophic disorders of the skin: Secondary | ICD-10-CM | POA: Diagnosis not present

## 2021-08-05 DIAGNOSIS — G4486 Cervicogenic headache: Secondary | ICD-10-CM

## 2021-08-05 DIAGNOSIS — E559 Vitamin D deficiency, unspecified: Secondary | ICD-10-CM | POA: Diagnosis not present

## 2021-08-05 DIAGNOSIS — I10 Essential (primary) hypertension: Secondary | ICD-10-CM

## 2021-08-05 NOTE — Progress Notes (Signed)
? ?Adrienne [name]     MRN: [id number]      DOB: August 20, 1945 ? ? ?HPI ?Adrienne [name] is here for follow up and re-evaluation of chronic medical conditions, medication management and review of any available recent lab and radiology data.  ?Preventive health is updated, specifically  Cancer screening and Immunization.   ?Questions or concerns regarding consultations or procedures which the PT has had in the interim are  addressed. ?The PT denies any adverse reactions to current medications since the last visit.  ?There are no new concerns.  ?There are no specific complaints  ? ?ROS ?Denies recent fever or chills. ?Denies sinus pressure, nasal congestion, ear pain or sore throat. ?Denies chest congestion, productive cough or wheezing. ?Denies chest pains, palpitations and leg swelling ?Denies abdominal pain, nausea, vomiting,diarrhea or constipation.   ?Denies dysuria, frequency, hesitancy or incontinence. ?Denies joint pain, swelling and limitation in mobility. ?Denies headaches, seizures, numbness, or tingling. ?Denies depression, anxiety or insomnia. ?C/o multiple skin tags , wants them removed, also c/o swelling on scalp want this checked ?PE ? ?BP 120/81   Pulse 63   Ht '5\' 5"'$  (1.651 m)   Wt 164 lb 1.9 oz (74.4 kg)   SpO2 97%   BMI 27.31 kg/m?  ? ?Patient alert and oriented and in no cardiopulmonary distress. ? ?HEENT: No facial asymmetry, EOMI,     Neck supple . ? ?Chest: Clear to auscultation bilaterally. ? ?CVS: S1, S2 no murmurs, no S3.Regular rate. ? ?ABD: Soft non tender.  ? ?Ext: No edema ? ?MS: Adequate ROM spine, shoulders, hips and knees. ? ?Skin: Intact, skin tags on nek and in axilla, cyst on scalp ?Psych: Good eye contact, normal affect. Memory intact not anxious or depressed appearing. ? ?CNS: CN 2-12 intact, power,  normal throughout.no focal deficits noted. ? ? ?Assessment & Plan ? ?Skin tags, multiple acquired ?C/o skin tags on chest and arpits, annoying , wants them removed, refer  surgery ? ?Overweight (BMI 25.0-29.9) ? ?Patient re-educated about  the importance of commitment to a  minimum of 150 minutes of exercise per week as able. ? ?The importance of healthy food choices with portion control discussed, as well as eating regularly and within a 12 hour window most days. ?The need to choose "clean , green" food 50 to 75% of the time is discussed, as well as to make water the primary drink and set a goal of 64 ounces water daily. ? ?  ? ?  08/05/2021  ?  1:07 PM 02/03/2021  ?  1:09 PM 11/04/2020  ?  1:54 PM  ?Weight /BMI  ?Weight 164 lb 1.9 oz 161 lb 162 lb  ?Height '5\' 5"'$  (1.651 m) 5' 5.5" (1.664 m) 5' 5.5" (1.664 m)  ?BMI 27.31 kg/m2 26.38 kg/m2 26.55 kg/m2  ? ? ? ? ?Essential hypertension ?Controlled, no change in medication ?DASH diet and commitment to daily physical activity for a minimum of 30 minutes discussed and encouraged, as a part of hypertension management. ?The importance of attaining a healthy weight is also discussed. ? ? ?  08/05/2021  ?  1:07 PM 02/03/2021  ?  1:09 PM 11/04/2020  ?  1:54 PM 10/16/2020  ?  8:16 AM 08/26/2020  ? 11:16 AM 05/16/2020  ?  4:00 PM 05/16/2020  ?  3:00 PM  ?BP/Weight  ?Systolic BP 786 767 209 470  161 155  ?Diastolic BP 81 73 81 75  83 85  ?Wt. (Lbs) 164.12 161 162 163 162    ?  BMI 27.31 kg/m2 26.38 kg/m2 26.55 kg/m2 26.71 kg/m2 26.48 kg/m2    ? ? ? ? ? ?Vitamin D deficiency ?Updated lab needed at/ before next visit. ? ? ?Epidermoid cyst of skin of scalp ?Refer surgery for eval pt reassured this seems to be a benign lesion and will not require removal ? ? ?

## 2021-08-05 NOTE — Patient Instructions (Addendum)
F/U in mid to end November, call if you need me sooner ? ?Please schedule mammogram at checkout, past due([facility]) ? ?Fasting cBC, lipid, cmp and EGFR, tSH, hBA1C and vit D 1 week before December appt ? ?You will be referred to Dr Constance Haw re skin tags and cyst on scalp ?Improved blood sugar and good cholesterol, great, continue to work on lifestyle changes, they are paying off ? ?No new meds ? ?Next  colonoscopy due in 2025 per GI ( Dr Oneida Alar) ? ?It is important that you exercise regularly at least 30 minutes 5 times a week. If you develop chest pain, have severe difficulty breathing, or feel very tired, stop exercising immediately and seek medical attention  ? ?Think about what you will eat, plan ahead. ?Choose " clean, green, fresh or frozen" over canned, processed or packaged foods which are more sugary, salty and fatty. ?70 to 75% of food eaten should be vegetables and fruit. ?Three meals at set times with snacks allowed between meals, but they must be fruit or vegetables. ?Aim to eat over a 12 hour period , example 7 am to 7 pm, and STOP after  your last meal of the day. ?Drink water,generally about 64 ounces per day, no other drink is as healthy. Fruit juice is best enjoyed in a healthy way, by EATING the fruit. ?Thanks for choosing [facility], we consider it a privelige to serve you. ? ?

## 2021-08-09 ENCOUNTER — Encounter: Payer: Self-pay | Admitting: Family Medicine

## 2021-08-09 NOTE — Assessment & Plan Note (Signed)
?  Patient re-educated about  the importance of commitment to a  minimum of 150 minutes of exercise per week as able. ? ?The importance of healthy food choices with portion control discussed, as well as eating regularly and within a 12 hour window most days. ?The need to choose "clean , green" food 50 to 75% of the time is discussed, as well as to make water the primary drink and set a goal of 64 ounces water daily. ? ?  ? ?  08/05/2021  ?  1:07 PM 02/03/2021  ?  1:09 PM 11/04/2020  ?  1:54 PM  ?Weight /BMI  ?Weight 164 lb 1.9 oz 161 lb 162 lb  ?Height '5\' 5"'$  (1.651 m) 5' 5.5" (1.664 m) 5' 5.5" (1.664 m)  ?BMI 27.31 kg/m2 26.38 kg/m2 26.55 kg/m2  ? ? ? ?

## 2021-08-09 NOTE — Assessment & Plan Note (Signed)
Refer surgery for eval pt reassured this seems to be a benign lesion and will not require removal ?

## 2021-08-09 NOTE — Assessment & Plan Note (Signed)
C/o skin tags on chest and arpits, annoying , wants them removed, refer surgery ?

## 2021-08-09 NOTE — Assessment & Plan Note (Signed)
Controlled, no change in medication ?DASH diet and commitment to daily physical activity for a minimum of 30 minutes discussed and encouraged, as a part of hypertension management. ?The importance of attaining a healthy weight is also discussed. ? ? ?  08/05/2021  ?  1:07 PM 02/03/2021  ?  1:09 PM 11/04/2020  ?  1:54 PM 10/16/2020  ?  8:16 AM 08/26/2020  ? 11:16 AM 05/16/2020  ?  4:00 PM 05/16/2020  ?  3:00 PM  ?BP/Weight  ?Systolic BP 848 350 757 322  161 155  ?Diastolic BP 81 73 81 75  83 85  ?Wt. (Lbs) 164.12 161 162 163 162    ?BMI 27.31 kg/m2 26.38 kg/m2 26.55 kg/m2 26.71 kg/m2 26.48 kg/m2    ? ? ? ? ?

## 2021-08-09 NOTE — Assessment & Plan Note (Signed)
Updated lab needed at/ before next visit.   

## 2021-08-10 ENCOUNTER — Encounter: Payer: Self-pay | Admitting: Family Medicine

## 2021-08-13 ENCOUNTER — Ambulatory Visit (HOSPITAL_COMMUNITY)
Admission: RE | Admit: 2021-08-13 | Discharge: 2021-08-13 | Disposition: A | Discharge status: Home / Self Care | Payer: Medicare Other | Source: Ambulatory Visit | Attending: Family Medicine | Admitting: Family Medicine

## 2021-08-13 DIAGNOSIS — Z1231 Encounter for screening mammogram for malignant neoplasm of breast: Secondary | ICD-10-CM | POA: Insufficient documentation

## 2021-08-18 ENCOUNTER — Encounter: Payer: Self-pay | Admitting: General Surgery

## 2021-08-18 ENCOUNTER — Ambulatory Visit (INDEPENDENT_AMBULATORY_CARE_PROVIDER_SITE_OTHER): Payer: Medicare Other | Admitting: General Surgery

## 2021-08-18 ENCOUNTER — Other Ambulatory Visit: Payer: Self-pay

## 2021-08-18 VITALS — BP 112/74 | HR 60 | Temp 98.2°F | Resp 14 | Ht 65.5 in | Wt 165.0 lb

## 2021-08-18 DIAGNOSIS — L918 Other hypertrophic disorders of the skin: Secondary | ICD-10-CM

## 2021-08-19 NOTE — Progress Notes (Signed)
Adrienne [name]; [id number]; [birth year] ? ? ?HPI ?Is a 76 year old black female who was referred to my care by Dr. Moshe Cipro for evaluation and treatment of a skin tag on her anterior chest wall.  She has had multiple skin tags removed in the past.  This 1 is tender to touch and is increasing in size.  She also has a cyst on her scalp that she wanted evaluated. ?Past Medical History:  ?Diagnosis Date  ? Allergy   ? Arthritis   ? Diverticulosis 05/2009  ? GERD (gastroesophageal reflux disease)   ? Hyperlipidemia   ? Hypertension   ? ? ?Past Surgical History:  ?Procedure Laterality Date  ? ABDOMINAL HYSTERECTOMY  1990  ? for fibroids ovaries remain   ? CHOLECYSTECTOMY  1980  ? COLONOSCOPY  05/2009  ? rare diverticula, small internal hemorrhoids. Next colonoscopy in 5-10 yrs.  ? COLONOSCOPY N/A 10/23/2018  ? Procedure: COLONOSCOPY;  Surgeon: Danie Binder, MD;  [facility]: [facility];  Service: Endoscopy;  Laterality: N/A;  2:00pm  ? JOINT REPLACEMENT N/A   ? Phreesia 06/02/2020  ? left nipple inversion bx benign  2000  ? POLYPECTOMY  10/23/2018  ? Procedure: POLYPECTOMY;  Surgeon: Danie Binder, MD;  [facility]: [facility];  Service: Endoscopy;;  ? TOTAL HIP ARTHROPLASTY Right 05/16/2020  ? Procedure: TOTAL HIP ARTHROPLASTY;  Surgeon: Earlie Server, MD;  Location: [facility];  Service: Orthopedics;  Laterality: Right;  ? ? ?Family History  ?Problem Relation Age of Onset  ? Kidney failure Father   ? Arthritis Father   ? Heart disease Sister   ? Hypertension Sister   ? Arthritis Sister   ? Breast cancer Sister   ? Diabetes Sister   ? Arthritis Sister   ? Lung cancer Brother   ?     was a smoker  ? Arthritis Brother   ? Arthritis Mother   ? Prostate cancer Brother   ? Colon cancer Neg Hx   ? ? ?Current Outpatient Medications on File Prior to Visit  ?Medication Sig Dispense Refill  ? acetaminophen (TYLENOL) 650 MG CR tablet Take 2 tablets (1,300 mg total) by mouth daily as needed for pain. 100 tablet 0  ? amLODipine (NORVASC)  2.5 MG tablet TAKE 1 TABLET (2.5 MG TOTAL) BY MOUTH DAILY. 90 tablet 2  ? Biotin 10 MG CAPS Take 10 mg by mouth every evening.    ? loratadine (CLARITIN) 10 MG tablet Take 10 mg by mouth daily as needed for allergies.    ? Multiple Minerals-Vitamins (CAL MAG ZINC +D3 PO) Take 1 tablet by mouth every evening.    ? Multiple Vitamin (MULTIVITAMIN WITH MINERALS) TABS tablet Take 1 tablet by mouth every evening. Women's Multivitamin 50+    ? triamterene-hydrochlorothiazide (MAXZIDE-25) 37.5-25 MG tablet TAKE 1 TABLET EVERY DAY 90 tablet 2  ? TURMERIC PO Take 450 mg by mouth every evening.    ? Lidocaine-Menthol (ICY HOT LIDOCAINE PLUS MENTHOL EX) Apply 1 application topically 4 (four) times daily as needed (pain.). Icy Hot Cream with Lidocaine plus Menthol Cream (Patient not taking: Reported on 08/05/2021)    ? Liniments (BLUE-EMU SUPER STRENGTH EX) Apply 1 application topically 4 (four) times daily as needed (pain.). (Patient not taking: Reported on 07/14/2021)    ? Liniments (PAIN RELIEF EX) Apply 1 application topically 4 (four) times daily as needed (pain.). Triderma Pain Relief Cream (Patient not taking: Reported on 07/14/2021)    ? Menthol, Topical Analgesic, (ICE BLUE EX) Apply 1  application topically 4 (four) times daily as needed (pain.). (Patient not taking: Reported on 07/14/2021)    ? Menthol-Methyl Salicylate (MUSCLE RUB) 10-15 % CREA Apply 1 application topically as needed for muscle pain. (Patient not taking: Reported on 07/14/2021)    ? ?No current facility-administered medications on file prior to visit.  ? ? ?Allergies  ?Allergen Reactions  ? Valsartan Shortness Of Breath  ? Ace Inhibitors Cough  ? ? ?Social History  ? ?Substance and Sexual Activity  ?Alcohol Use Not Currently  ? Comment: rarely   ? ? ?Social History  ? ?Tobacco Use  ?Smoking Status Never  ?Smokeless Tobacco Never  ? ? ?Review of Systems  ?Constitutional: Negative.   ?HENT: Negative.    ?Eyes: Negative.   ?Respiratory: Negative.     ?Cardiovascular: Negative.   ?Gastrointestinal: Negative.   ?Genitourinary: Negative.   ?Musculoskeletal: Negative.   ?Skin:  Positive for itching. Negative for rash.  ?Neurological: Negative.   ?Endo/Heme/Allergies: Negative.   ?Psychiatric/Behavioral: Negative.    ? ?Objective  ? ?Vitals:  ? 08/18/21 1537  ?BP: 112/74  ?Pulse: 60  ?Resp: 14  ?Temp: 98.2 ?F (36.8 ?C)  ?SpO2: 97%  ? ? ?Physical Exam ?Vitals reviewed.  ?Constitutional:   ?   Appearance: Normal appearance. She is normal weight. She is not ill-appearing.  ?HENT:  ?   Head: Normocephalic and atraumatic.  ?Cardiovascular:  ?   Rate and Rhythm: Normal rate and regular rhythm.  ?   Heart sounds: Normal heart sounds. No murmur heard. ?  No friction rub. No gallop.  ?Pulmonary:  ?   Effort: Pulmonary effort is normal. No respiratory distress.  ?   Breath sounds: Normal breath sounds. No stridor. No wheezing, rhonchi or rales.  ?Skin: ?   General: Skin is warm and dry.  ?   Findings: No erythema or rash.  ?   Comments: Patient had a difficult time isolating the cyst of concern.  Once it was found, it was noted to be an oval, mobile, less than 5 mm subcutaneous cyst.  There was a keratotic 3 mm skin tag on the anterior chest wall just over the sternum.  Patient consented for removal.  1% Xylocaine was used for local anesthesia.  The area was prepped with Betadine.  I cautery was used to shave the skin tag off the epidermis.  It was disposed of.  Patient tolerated the procedure well.  ?Neurological:  ?   Mental Status: She is alert and oriented to person, place, and time.  ? ? ?Assessment  ?Skin tag, chest wall, removed ?Benign cyst, scalp.  I told the patient that this was too small to excise at the present time.  She understands and agrees. ?Plan  ?Patient is to follow-up with me as needed. ?

## 2021-08-31 ENCOUNTER — Ambulatory Visit (INDEPENDENT_AMBULATORY_CARE_PROVIDER_SITE_OTHER): Payer: Medicare Other

## 2021-08-31 VITALS — BP 114/77 | HR 64 | Ht 65.0 in | Wt 165.1 lb

## 2021-08-31 DIAGNOSIS — Z0000 Encounter for general adult medical examination without abnormal findings: Secondary | ICD-10-CM | POA: Diagnosis not present

## 2021-08-31 NOTE — Progress Notes (Signed)
? ?Subjective:  ? Adrienne [name] is a 76 y.o. female who presents for Medicare Annual  ?Review of Systems    ? ?Adrienne [name] , ?Thank you for taking time to come for your Medicare Wellness Visit. I appreciate your ongoing commitment to your health goals. Please review the following plan we discussed and let me know if I can assist you in the future.  ? ?These are the goals we discussed: ? Goals   ? ?  DIET - INCREASE WATER INTAKE   ?  Increase water intake  ?  ?  Exercise 3x per week (30 min per time)   ?  Patient would like to start a more routine exercise program and start using an eliptical machine ? ?  ?  LIFESTYLE - DECREASE FALLS RISK   ? ?  ?  ?This is a list of the screening recommended for you and due dates:  ?Health Maintenance  ?Topic Date Due  ? Tetanus Vaccine  04/04/2019  ? COVID-19 Vaccine (3 - Moderna risk series) 09/24/2020  ? Zoster (Shingles) Vaccine (2 of 2) 10/04/2021  ? Flu Shot  11/24/2021  ? Mammogram  08/14/2023  ? Colon Cancer Screening  10/23/2023  ? Pneumonia Vaccine  Completed  ? DEXA scan (bone density measurement)  Completed  ? Hepatitis C Screening: USPSTF Recommendation to screen - Ages 64-79 yo.  Completed  ? HPV Vaccine  Aged Out  ?  ? ?  ? ?   ?Objective:  ?  ?There were no vitals filed for this visit. ?There is no height or weight on file to calculate BMI. ? ? ?  07/14/2021  ?  1:11 PM 02/20/2021  ?  4:15 PM 08/26/2020  ? 11:35 AM 06/02/2020  ?  2:29 PM 05/16/2020  ?  6:33 AM 05/07/2020  ? 11:27 AM 08/20/2019  ? 11:31 AM  ?Advanced Directives  ?Does Patient Have a Medical Advance Directive? No No No No No No No  ?Would patient like information on creating a medical advance directive? No - Patient declined No - Patient declined Yes (ED - Information included in AVS) No - Patient declined No - Patient declined No - Patient declined No - Patient declined  ? ? ?Current Medications (verified) ?Outpatient Encounter Medications as of 08/31/2021  ?Medication Sig  ? acetaminophen (TYLENOL) 650 MG CR  tablet Take 2 tablets (1,300 mg total) by mouth daily as needed for pain.  ? amLODipine (NORVASC) 2.5 MG tablet TAKE 1 TABLET (2.5 MG TOTAL) BY MOUTH DAILY.  ? Biotin 10 MG CAPS Take 10 mg by mouth every evening.  ? Lidocaine-Menthol (ICY HOT LIDOCAINE PLUS MENTHOL EX) Apply 1 application topically 4 (four) times daily as needed (pain.). Icy Hot Cream with Lidocaine plus Menthol Cream (Patient not taking: Reported on 08/05/2021)  ? Liniments (BLUE-EMU SUPER STRENGTH EX) Apply 1 application topically 4 (four) times daily as needed (pain.). (Patient not taking: Reported on 07/14/2021)  ? Liniments (PAIN RELIEF EX) Apply 1 application topically 4 (four) times daily as needed (pain.). Triderma Pain Relief Cream (Patient not taking: Reported on 07/14/2021)  ? loratadine (CLARITIN) 10 MG tablet Take 10 mg by mouth daily as needed for allergies.  ? Menthol, Topical Analgesic, (ICE BLUE EX) Apply 1 application topically 4 (four) times daily as needed (pain.). (Patient not taking: Reported on 07/14/2021)  ? Menthol-Methyl Salicylate (MUSCLE RUB) 10-15 % CREA Apply 1 application topically as needed for muscle pain. (Patient not taking: Reported on 07/14/2021)  ? Multiple  Minerals-Vitamins (CAL MAG ZINC +D3 PO) Take 1 tablet by mouth every evening.  ? Multiple Vitamin (MULTIVITAMIN WITH MINERALS) TABS tablet Take 1 tablet by mouth every evening. Women's Multivitamin 50+  ? triamterene-hydrochlorothiazide (MAXZIDE-25) 37.5-25 MG tablet TAKE 1 TABLET EVERY DAY  ? TURMERIC PO Take 450 mg by mouth every evening.  ? ?No facility-administered encounter medications on file as of 08/31/2021.  ? ? ?Allergies (verified) ?Valsartan and Ace inhibitors  ? ?History: ?Past Medical History:  ?Diagnosis Date  ? Allergy   ? Arthritis   ? Diverticulosis 05/2009  ? GERD (gastroesophageal reflux disease)   ? Hyperlipidemia   ? Hypertension   ? ?Past Surgical History:  ?Procedure Laterality Date  ? ABDOMINAL HYSTERECTOMY  1990  ? for fibroids ovaries  remain   ? CHOLECYSTECTOMY  1980  ? COLONOSCOPY  05/2009  ? rare diverticula, small internal hemorrhoids. Next colonoscopy in 5-10 yrs.  ? COLONOSCOPY N/A 10/23/2018  ? Procedure: COLONOSCOPY;  Surgeon: Danie Binder, MD;  [facility];  Service: Endoscopy;  Laterality: N/A;  2:00pm  ? JOINT REPLACEMENT N/A   ? [employer] 06/02/2020  ? left nipple inversion bx benign  2000  ? POLYPECTOMY  10/23/2018  ? Procedure: POLYPECTOMY;  Surgeon: Danie Binder, MD;  [facility];  Service: Endoscopy;;  ? TOTAL HIP ARTHROPLASTY Right 05/16/2020  ? Procedure: TOTAL HIP ARTHROPLASTY;  Surgeon: Earlie Server, MD;  Location: [facility];  Service: Orthopedics;  Laterality: Right;  ? ?Family History  ?Problem Relation Age of Onset  ? Kidney failure Father   ? Arthritis Father   ? Heart disease Sister   ? Hypertension Sister   ? Arthritis Sister   ? Breast cancer Sister   ? Diabetes Sister   ? Arthritis Sister   ? Lung cancer Brother   ?     was a smoker  ? Arthritis Brother   ? Arthritis Mother   ? Prostate cancer Brother   ? Colon cancer Neg Hx   ? ?Social History  ? ?Socioeconomic History  ? Marital status: Single  ?  Spouse name: Not on file  ? Number of children: 0  ? Years of education: Not on file  ? Highest education level: Not on file  ?Occupational History  ? Occupation: retired  ?  Employer: RETIRED  ?Tobacco Use  ? Smoking status: Never  ? Smokeless tobacco: Never  ?Vaping Use  ? Vaping Use: Never used  ?Substance and Sexual Activity  ? Alcohol use: Not Currently  ?  Comment: rarely   ? Drug use: No  ? Sexual activity: Not Currently  ?  Birth control/protection: Surgical  ?  Comment: not asked  ?Other Topics Concern  ? Not on file  ?Social History Narrative  ? Not on file  ? ?Social Determinants of Health  ? ?Financial Resource Strain: Not on file  ?Food Insecurity: No Food Insecurity  ? Worried About Charity fundraiser in the Last Year: Never true  ? Ran Out of Food in the Last Year: Never true   ?Transportation Needs: No Transportation Needs  ? Lack of Transportation (Medical): No  ? Lack of Transportation (Non-Medical): No  ?Physical Activity: Not on file  ?Stress: Not on file  ?Social Connections: Not on file  ? ? ?Tobacco Counseling ?Counseling given: Not Answered ? ? ?Clinical Intake: ? ?  ? ?  ? ?  ? ?  ? ?How often do you need to have someone help you  when you read instructions, pamphlets, or other written materials from your doctor or pharmacy?: (P) 1 - Never ? ?Diabetic?NO ? ?  ? ?  ? ? ?Activities of Daily Living ? ?  08/31/2021  ? 12:16 AM  ?In your present state of health, do you have any difficulty performing the following activities:  ?Hearing? 0  ?Vision? 0  ?Difficulty concentrating or making decisions? 0  ?Walking or climbing stairs? 0  ?Dressing or bathing? 0  ?Doing errands, shopping? 0  ?Preparing Food and eating ? N  ?Using the Toilet? N  ?In the past six months, have you accidently leaked urine? N  ?Do you have problems with loss of bowel control? N  ?Managing your Medications? N  ?Managing your Finances? N  ?Housekeeping or managing your Housekeeping? N  ? ? ?Patient Care Team: ?Fayrene Helper, MD as PCP - General ?Kassie Mends, RN as [place] Management ? ?Indicate any recent Medical Services you may have received from other than [facility] providers in the past year (date may be approximate). ? ?   ?Assessment:  ? This is a routine wellness examination for [facility]. ? ?Hearing/Vision screen ?No results found. ? ?Dietary issues and exercise activities discussed: ?  ? ? Goals Addressed   ?None ?  ?Depression Screen ? ?  08/05/2021  ?  1:08 PM 07/14/2021  ?  1:07 PM 07/14/2021  ? 12:46 PM 02/03/2021  ?  1:08 PM 10/16/2020  ?  8:18 AM 08/26/2020  ? 11:22 AM 03/31/2020  ?  3:28 PM  ?PHQ 2/9 Scores  ?PHQ - 2 Score 0 0 0 0 1 0 0  ?PHQ- 9 Score    0     ?  ?Fall Risk ? ?  08/31/2021  ? 12:16 AM 08/18/2021  ?  3:37 PM 08/05/2021  ?  1:08 PM 07/14/2021  ? 12:48 PM 02/03/2021  ?  1:08  PM  ?Fall Risk   ?Falls in the past year? 0 0 0 0 0  ?Number falls in past yr: 0  0    ?Injury with Fall? 0  0    ?Risk for fall due to :   No Fall Risks    ?Follow up  Falls evaluation completed Falls evaluatio

## 2021-08-31 NOTE — Patient Instructions (Addendum)
Adrienne [name] , ?Thank you for taking time to come for your Medicare Wellness Visit. I appreciate your ongoing commitment to your health goals. Please review the following plan we discussed and let me know if I can assist you in the future.  ? ?Screening recommendations/referrals: ?Colonoscopy: Complete  ?Mammogram: Complete ?Bone Density: Complete ?Recommended yearly ophthalmology/optometry visit for glaucoma screening and checkup ?Recommended yearly dental visit for hygiene and checkup ? ?Vaccinations: ?Influenza vaccine: Complete ?Pneumococcal vaccine: Complete ?Tdap vaccine: Due now ?Shingles vaccine: Complete   ? ?Advanced directives: no ? ?Conditions/risks identified: Hypertension,Diabetes.  ? ?Next appointment: 1 year  ? ? ? ? ?Adrienne [name] , ?Thank you for taking time to come for your Medicare Wellness Visit. I appreciate your ongoing commitment to your health goals. Please review the following plan we discussed and let me know if I can assist you in the future.  ? ?These are the goals we discussed: ? Goals   ? ?   DIET - INCREASE WATER INTAKE   ?   Increase water intake  ?  ?   Exercise 3x per week (30 min per time)   ?   Patient would like to start a more routine exercise program and start using an eliptical machine ? ?  ?   LIFESTYLE - DECREASE FALLS RISK   ?   Patient Stated (pt-stated)   ?   None at this time. ?  ? ?  ?  ?This is a list of the screening recommended for you and due dates:  ?Health Maintenance  ?Topic Date Due  ? Tetanus Vaccine  04/04/2019  ? COVID-19 Vaccine (3 - Moderna risk series) 09/24/2020  ? Zoster (Shingles) Vaccine (2 of 2) 10/04/2021  ? Flu Shot  11/24/2021  ? Mammogram  08/14/2023  ? Colon Cancer Screening  10/23/2023  ? Pneumonia Vaccine  Completed  ? DEXA scan (bone density measurement)  Completed  ? Hepatitis C Screening: USPSTF Recommendation to screen - Ages 85-79 yo.  Completed  ? HPV Vaccine  Aged Out  ?  ?Preventive Care 70 Years and Older, Female ?Preventive care refers to  lifestyle choices and visits with your health care provider that can promote health and wellness. ?What does preventive care include? ?A yearly physical exam. This is also called an annual well check. ?Dental exams once or twice a year. ?Routine eye exams. Ask your health care provider how often you should have your eyes checked. ?Personal lifestyle choices, including: ?Daily care of your teeth and gums. ?Regular physical activity. ?Eating a healthy diet. ?Avoiding tobacco and drug use. ?Limiting alcohol use. ?Practicing safe sex. ?Taking low-dose aspirin every day. ?Taking vitamin and mineral supplements as recommended by your health care provider. ?What happens during an annual well check? ?The services and screenings [name] by your health care provider during your annual well check will depend on your age, overall health, lifestyle risk factors, and family history of disease. ?Counseling  ?Your health care provider may ask you questions about your: ?Alcohol use. ?Tobacco use. ?Drug use. ?Emotional well-being. ?Home and relationship well-being. ?Sexual activity. ?Eating habits. ?History of falls. ?Memory and ability to understand (cognition). ?Work and work Statistician. ?Reproductive health. ?Screening  ?You may have the following tests or measurements: ?Height, weight, and BMI. ?Blood pressure. ?Lipid and cholesterol levels. These may be checked every 5 years, or more frequently if you are over 70 years old. ?Skin check. ?Lung cancer screening. You may have this screening every year starting at age 90 if  you have a 30-pack-year history of smoking and currently smoke or have quit within the past 15 years. ?Fecal occult blood test (FOBT) of the stool. You may have this test every year starting at age 75. ?Flexible sigmoidoscopy or colonoscopy. You may have a sigmoidoscopy every 5 years or a colonoscopy every 10 years starting at age 75. ?Hepatitis C blood test. ?Hepatitis B blood test. ?Sexually transmitted disease  (STD) testing. ?Diabetes screening. This is [name] by checking your blood sugar (glucose) after you have not eaten for a while (fasting). You may have this [name] every 1-3 years. ?Bone density scan. This is [name] to screen for osteoporosis. You may have this [name] starting at age 101. ?Mammogram. This may be [name] every 1-2 years. Talk to your health care provider about how often you should have regular mammograms. ?Talk with your health care provider about your test results, treatment options, and if necessary, the need for more tests. ?Vaccines  ?Your health care provider may recommend certain vaccines, such as: ?Influenza vaccine. This is recommended every year. ?Tetanus, diphtheria, and acellular pertussis (Tdap, Td) vaccine. You may need a Td booster every 10 years. ?Zoster vaccine. You may need this after age 61. ?Pneumococcal 13-valent conjugate (PCV13) vaccine. One dose is recommended after age 54. ?Pneumococcal polysaccharide (PPSV23) vaccine. One dose is recommended after age 52. ?Talk to your health care provider about which screenings and vaccines you need and how often you need them. ?This information is not intended to replace advice given to you by your health care provider. Make sure you discuss any questions you have with your health care provider. ?Document Released: 05/09/2015 Document Revised: 12/31/2015 Document Reviewed: 02/11/2015 ?Elsevier Interactive Patient Education ? 2017 [place]. ? ?Fall Prevention in the Home ?Falls can cause injuries. They can happen to people of all ages. There are many things you can do to make your home safe and to help prevent falls. ?What can I do on the outside of my home? ?Regularly fix the edges of walkways and driveways and fix any cracks. ?Remove anything that might make you trip as you walk through a door, such as a raised step or threshold. ?Trim any bushes or trees on the path to your home. ?Use bright outdoor lighting. ?Clear any walking paths of anything  that might make someone trip, such as rocks or tools. ?Regularly check to see if handrails are loose or broken. Make sure that both sides of any steps have handrails. ?Any raised decks and porches should have guardrails on the edges. ?Have any leaves, snow, or ice cleared regularly. ?Use sand or salt on walking paths during winter. ?Clean up any spills in your garage right away. This includes oil or grease spills. ?What can I do in the bathroom? ?Use night lights. ?Install grab bars by the toilet and in the tub and shower. Do not use towel bars as grab bars. ?Use non-skid mats or decals in the tub or shower. ?If you need to sit down in the shower, use a plastic, non-slip stool. ?Keep the floor dry. Clean up any water that spills on the floor as soon as it happens. ?Remove soap buildup in the tub or shower regularly. ?Attach bath mats securely with double-sided non-slip rug tape. ?Do not have throw rugs and other things on the floor that can make you trip. ?What can I do in the bedroom? ?Use night lights. ?Make sure that you have a light by your bed that is easy to reach. ?Do not  use any sheets or blankets that are too big for your bed. They should not hang down onto the floor. ?Have a firm chair that has side arms. You can use this for support while you get dressed. ?Do not have throw rugs and other things on the floor that can make you trip. ?What can I do in the kitchen? ?Clean up any spills right away. ?Avoid walking on wet floors. ?Keep items that you use a lot in easy-to-reach places. ?If you need to reach something above you, use a strong step stool that has a grab bar. ?Keep electrical cords out of the way. ?Do not use floor polish or wax that makes floors slippery. If you must use wax, use non-skid floor wax. ?Do not have throw rugs and other things on the floor that can make you trip. ?What can I do with my stairs? ?Do not leave any items on the stairs. ?Make sure that there are handrails on both sides of  the stairs and use them. Fix handrails that are broken or loose. Make sure that handrails are as long as the stairways. ?Check any carpeting to make sure that it is firmly attached to the stairs. Fix an

## 2021-09-14 ENCOUNTER — Encounter: Payer: Self-pay | Admitting: Family Medicine

## 2021-09-16 DIAGNOSIS — I8311 Varicose veins of right lower extremity with inflammation: Secondary | ICD-10-CM | POA: Diagnosis not present

## 2021-09-16 DIAGNOSIS — I8312 Varicose veins of left lower extremity with inflammation: Secondary | ICD-10-CM | POA: Diagnosis not present

## 2021-09-30 DIAGNOSIS — I8311 Varicose veins of right lower extremity with inflammation: Secondary | ICD-10-CM | POA: Diagnosis not present

## 2021-09-30 DIAGNOSIS — I8312 Varicose veins of left lower extremity with inflammation: Secondary | ICD-10-CM | POA: Diagnosis not present

## 2021-10-06 ENCOUNTER — Ambulatory Visit (INDEPENDENT_AMBULATORY_CARE_PROVIDER_SITE_OTHER): Payer: Medicare Other | Admitting: *Deleted

## 2021-10-06 DIAGNOSIS — H524 Presbyopia: Secondary | ICD-10-CM | POA: Diagnosis not present

## 2021-10-06 DIAGNOSIS — I10 Essential (primary) hypertension: Secondary | ICD-10-CM

## 2021-10-06 DIAGNOSIS — H401131 Primary open-angle glaucoma, bilateral, mild stage: Secondary | ICD-10-CM | POA: Diagnosis not present

## 2021-10-06 DIAGNOSIS — E785 Hyperlipidemia, unspecified: Secondary | ICD-10-CM

## 2021-10-06 NOTE — Chronic Care Management (AMB) (Signed)
Chronic Care Management   CCM RN Visit Note  10/06/2021 Name: Adrienne [name] MRN: [id number] DOB: [birth year]  Subjective: Adrienne [name] is a 76 y.o. year old female who is a primary care patient of Fayrene Helper, MD. The care management team was consulted for assistance with disease management and care coordination needs.    Engaged with patient by telephone for follow up visit in response to provider referral for case management and/or care coordination services.   Consent to Services:  The patient was given information about Chronic Care Management services, agreed to services, and gave verbal consent prior to initiation of services.  Please see initial visit note for detailed documentation.   Patient agreed to services and verbal consent obtained.   Assessment: Review of patient past medical history, allergies, medications, health status, including review of consultants reports, laboratory and other test data, was performed as part of comprehensive evaluation and provision of chronic care management services.   SDOH (Social Determinants of Health) assessments and interventions performed:    CCM Care Plan  Allergies  Allergen Reactions   Valsartan Shortness Of Breath   Ace Inhibitors Cough    Outpatient Encounter Medications as of 10/06/2021  Medication Sig Note   acetaminophen (TYLENOL) 650 MG CR tablet Take 2 tablets (1,300 mg total) by mouth daily as needed for pain.    amLODipine (NORVASC) 2.5 MG tablet TAKE 1 TABLET (2.5 MG TOTAL) BY MOUTH DAILY.    Biotin 10 MG CAPS Take 10 mg by mouth every evening.    Latanoprost 0.005 % EMUL Apply to eye.    Lidocaine-Menthol (ICY HOT LIDOCAINE PLUS MENTHOL EX) Apply 1 application. topically 4 (four) times daily as needed (pain.). Icy Hot Cream with Lidocaine plus Menthol Cream 05/01/2020: Patient alternates between [place], [place] with Lidocaine, Ice Blue, Blue-Emu, & Biofreeze pain relief creams    Liniments (BLUE-EMU SUPER  STRENGTH EX) Apply 1 application. topically 4 (four) times daily as needed (pain.). 05/01/2020: Patient alternates between Felicity Pellegrini Hot with Lidocaine, Ice Blue, Blue-Emu, & Biofreeze pain relief creams    Liniments (PAIN RELIEF EX) Apply 1 application. topically 4 (four) times daily as needed (pain.). Triderma Pain Relief Cream 05/01/2020: Patient alternates between [place], [facility] with Lidocaine, Ice Blue, Blue-Emu, & Biofreeze pain relief creams    loratadine (CLARITIN) 10 MG tablet Take 10 mg by mouth daily as needed for allergies.    Menthol, Topical Analgesic, (ICE BLUE EX) Apply 1 application. topically 4 (four) times daily as needed (pain.). 05/01/2020: Patient alternates between Jordan Likes with Lidocaine, Ice Blue, Blue-Emu, & Biofreeze pain relief creams    Menthol-Methyl Salicylate (MUSCLE RUB) 10-15 % CREA Apply 1 application. topically as needed for muscle pain.    Multiple Minerals-Vitamins (CAL MAG ZINC +D3 PO) Take 1 tablet by mouth every evening.    Multiple Vitamin (MULTIVITAMIN WITH MINERALS) TABS tablet Take 1 tablet by mouth every evening. Women's Multivitamin 50+    triamterene-hydrochlorothiazide (MAXZIDE-25) 37.5-25 MG tablet TAKE 1 TABLET EVERY DAY    TURMERIC PO Take 450 mg by mouth every evening. (Patient not taking: Reported on 10/06/2021) 05/01/2020: On hold until patient purchases more   No facility-administered encounter medications on file as of 10/06/2021.    Patient Active Problem List   Diagnosis Date Noted   Skin tags, multiple acquired 08/05/2021   Epidermoid cyst of skin of scalp 08/05/2021   Headache 08/05/2021   Osteopenia 02/03/2021   Acquired skin tag 10/19/2020   Leg swelling 03/31/2020  Venous insufficiency of both lower extremities 03/31/2020   Vitamin D deficiency 03/06/2020   Prediabetes 03/06/2020   Knee pain, right 04/06/2019   Left ankle sprain 04/06/2019   Personal history of colonic polyps 07/12/2018   Ingrown toenail of both feet  03/23/2017   Hip pain, chronic, right 02/03/2016   GERD (gastroesophageal reflux disease) 12/26/2014   Diverticulosis of colon without hemorrhage 07/05/2014   FH: pancreatic cancer 01/26/2014   FH: glaucoma 03/24/2013   Overweight (BMI 25.0-29.9) 03/13/2012   Hyperlipidemia LDL goal <100 04/03/2009   Essential hypertension 04/03/2009    Conditions to be addressed/monitored:HTN and HLD  Care Plan : RN Care Manager Plan of Care  Updates made by Kassie Mends, RN since 10/06/2021 12:00 AM     Problem: No plan of care established for management of chronic disease state  (HTN, HLD)   Priority: High     Goal: Development of plan of care for chronic disease management (HTN, HLD)   Start Date: 07/14/2021  Expected End Date: 01/10/2022  Priority: High  Note:   Current Barriers:  Knowledge Deficits related to plan of care for management of HTN and HLD  Patient reports she lives alone, is independent in all aspects of her care, continues to drive, has a sister nearby she can call on if needed. Patient reports she tries to eat as healthy as possible, has new blood pressure cuff and is now checking blood pressure with "readings excellent"  systolic 165 or below and diastolic 70 or below.  RNCM Clinical Goal(s):  Patient will verbalize understanding of plan for management of HTN and HLD as evidenced by patient report, review of EHR and  through collaboration with RN Care manager, provider, and care team.   Interventions: 1:1 collaboration with primary care provider regarding development and update of comprehensive plan of care as evidenced by provider attestation and co-signature Inter-disciplinary care team collaboration (see longitudinal plan of care) Evaluation of current treatment plan related to  self management and patient's adherence to plan as established by provider   Hyperlipidemia:  (Status: New goal. Goal on Track (progressing): YES.) Long Term Goal  Lab Results  Component Value  Date   CHOL 242 (H) 02/02/2021   HDL 95 02/02/2021   [place] 132 (H) 02/02/2021   TRIG 89 02/02/2021   CHOLHDL 2.5 02/02/2021     Medication review performed; medication list updated in electronic medical record.  Reviewed importance of limiting foods high in cholesterol; Reviewed exercise goals and target of 150 minutes per week; Pain assessment completed Reinforced heart healthy diet  Hypertension Interventions:  (Status:  New goal. and Goal on track:  Yes.) Long Term Goal Last practice recorded BP readings:  BP Readings from Last 3 Encounters:  02/03/21 123/73  11/04/20 121/81  10/16/20 124/75  Most recent eGFR/CrCl:  Lab Results  Component Value Date   EGFR 63 02/02/2021    No components found for: CRCL  Reviewed medications with patient and discussed importance of compliance Counseled on the importance of exercise goals with target of 150 minutes per week Discussed complications of poorly controlled blood pressure such as heart disease, stroke, circulatory complications, vision complications, kidney impairment, sexual dysfunction  Reinforced importance of adhering to low sodium diet  Patient Goals/Self-Care Activities: Take medications as prescribed   Attend all scheduled provider appointments Call pharmacy for medication refills 3-7 days in advance of running out of medications Attend church or other social activities Perform all self care activities independently  Perform IADL's (  shopping, preparing meals, housekeeping, managing finances) independently Call provider office for new concerns or questions  check blood pressure 3 times per week write blood pressure results in a log or diary take blood pressure log to all doctor appointments call doctor for signs and symptoms of high blood pressure keep all doctor appointments take medications for blood pressure exactly as prescribed report new symptoms to your doctor eat more whole grains, fruits and vegetables, lean  meats and healthy fats - call doctor when you experience any new symptoms - adhere to prescribed diet: heart healthy, low sodium Bake or broil foods instead of frying Avoid trans/ saturated fats Continue following low sodium and heart healthy diet Continue exercising and line dancing- keep up the good work!       Plan:Telephone follow up appointment with care management team member scheduled for:  12/24/21  Jacqlyn Larsen [facility], BSN RN Case Manager [facility] [phone]

## 2021-10-23 DIAGNOSIS — I10 Essential (primary) hypertension: Secondary | ICD-10-CM

## 2021-10-23 DIAGNOSIS — E785 Hyperlipidemia, unspecified: Secondary | ICD-10-CM

## 2021-11-11 ENCOUNTER — Other Ambulatory Visit: Payer: Self-pay | Admitting: Family Medicine

## 2021-11-30 ENCOUNTER — Ambulatory Visit (INDEPENDENT_AMBULATORY_CARE_PROVIDER_SITE_OTHER): Payer: Medicare Other | Admitting: *Deleted

## 2021-11-30 ENCOUNTER — Encounter: Payer: Self-pay | Admitting: *Deleted

## 2021-11-30 DIAGNOSIS — E785 Hyperlipidemia, unspecified: Secondary | ICD-10-CM

## 2021-11-30 DIAGNOSIS — I10 Essential (primary) hypertension: Secondary | ICD-10-CM

## 2021-11-30 NOTE — Patient Instructions (Signed)
Visit Information  Thank you for taking time to visit with me today. Please don't hesitate to contact me if I can be of assistance to you before our next scheduled telephone appointment.  Following are the goals we discussed today:  Take medications as prescribed   Attend all scheduled provider appointments Call pharmacy for medication refills 3-7 days in advance of running out of medications Attend church or other social activities Perform all self care activities independently  Perform IADL's (shopping, preparing meals, housekeeping, managing finances) independently Call provider office for new concerns or questions  check blood pressure 3 times per week write blood pressure results in a log or diary take blood pressure log to all doctor appointments call doctor for signs and symptoms of high blood pressure keep all doctor appointments take medications for blood pressure exactly as prescribed report new symptoms to your doctor eat more whole grains, fruits and vegetables, lean meats and healthy fats - call doctor when you experience any new symptoms - adhere to prescribed diet: heart healthy, low sodium Bake or broil foods instead of frying Avoid trans/ saturated fats- limit fast food Continue following low sodium and heart healthy diet Continue exercising and line dancing- keep up the good work! Case closure today- please talk with your primary care provider if you have any future care management needs  Please call the care guide team at [phone] if you need to cancel or reschedule your appointment.   If you are experiencing a Mental Health or [place] or need someone to talk to, please call the Suicide and Crisis Lifeline: 988 call the [place] National Suicide Prevention Lifeline: 270-519-3819 or TTY: 770-440-2375 TTY 9296169569) to talk to a trained counselor call 1-800-273-TALK (toll free, 24 hour hotline) go to [facility] [address], [place] [phone]) call the [place]: [phone] call 911   Patient verbalizes understanding of instructions and care plan provided today and agrees to view in [place]. Active MyChart status and patient understanding of how to access instructions and care plan via MyChart confirmed with patient.     Jacqlyn Larsen [facility], BSN RN Case Manager [facility] [phone]

## 2021-11-30 NOTE — Chronic Care Management (AMB) (Signed)
Chronic Care Management   CCM RN Visit Note  11/30/2021 Name: Adrienne [name] MRN: [id number] DOB: 08-30-1945  Subjective: Adrienne [name] is a 76 y.o. year old female who is a primary care patient of Fayrene Helper, MD. The care management team was consulted for assistance with disease management and care coordination needs.    Engaged with patient by telephone for follow up visit in response to provider referral for case management and/or care coordination services.   Consent to Services:  The patient was given information about Chronic Care Management services, agreed to services, and gave verbal consent prior to initiation of services.  Please see initial visit note for detailed documentation.   Patient agreed to services and verbal consent obtained.   Assessment: Review of patient past medical history, allergies, medications, health status, including review of consultants reports, laboratory and other test data, was performed as part of comprehensive evaluation and provision of chronic care management services.   SDOH (Social Determinants of Health) assessments and interventions performed:    CCM Care Plan  Allergies  Allergen Reactions   Valsartan Shortness Of Breath   Ace Inhibitors Cough    Outpatient Encounter Medications as of 11/30/2021  Medication Sig Note   acetaminophen (TYLENOL) 650 MG CR tablet Take 2 tablets (1,300 mg total) by mouth daily as needed for pain.    amLODipine (NORVASC) 2.5 MG tablet TAKE 1 TABLET (2.5 MG TOTAL) BY MOUTH DAILY.    Biotin 10 MG CAPS Take 10 mg by mouth every evening.    Latanoprost 0.005 % EMUL Apply to eye.    Lidocaine-Menthol (ICY HOT LIDOCAINE PLUS MENTHOL EX) Apply 1 application. topically 4 (four) times daily as needed (pain.). Icy Hot Cream with Lidocaine plus Menthol Cream 05/01/2020: Patient alternates between [place], [place] with Lidocaine, Ice Blue, Blue-Emu, & Biofreeze pain relief creams    Liniments (BLUE-EMU SUPER  STRENGTH EX) Apply 1 application. topically 4 (four) times daily as needed (pain.). 05/01/2020: Patient alternates between Felicity Pellegrini Hot with Lidocaine, Ice Blue, Blue-Emu, & Biofreeze pain relief creams    Liniments (PAIN RELIEF EX) Apply 1 application. topically 4 (four) times daily as needed (pain.). Triderma Pain Relief Cream 05/01/2020: Patient alternates between [place], [facility] with Lidocaine, Ice Blue, Blue-Emu, & Biofreeze pain relief creams    loratadine (CLARITIN) 10 MG tablet Take 10 mg by mouth daily as needed for allergies.    Menthol, Topical Analgesic, (ICE BLUE EX) Apply 1 application. topically 4 (four) times daily as needed (pain.). 05/01/2020: Patient alternates between [employer] Likes with Lidocaine, Ice Blue, Blue-Emu, & Biofreeze pain relief creams    Menthol-Methyl Salicylate (MUSCLE RUB) 10-15 % CREA Apply 1 application. topically as needed for muscle pain.    Multiple Minerals-Vitamins (CAL MAG ZINC +D3 PO) Take 1 tablet by mouth every evening.    Multiple Vitamin (MULTIVITAMIN WITH MINERALS) TABS tablet Take 1 tablet by mouth every evening. Women's Multivitamin 50+    triamterene-hydrochlorothiazide (MAXZIDE-25) 37.5-25 MG tablet TAKE 1 TABLET EVERY DAY    TURMERIC PO Take 450 mg by mouth every evening. 05/01/2020: On hold until patient purchases more   No facility-administered encounter medications on file as of 11/30/2021.    Patient Active Problem List   Diagnosis Date Noted   Skin tags, multiple acquired 08/05/2021   Epidermoid cyst of skin of scalp 08/05/2021   Headache 08/05/2021   Osteopenia 02/03/2021   Acquired skin tag 10/19/2020   Leg swelling 03/31/2020   Venous insufficiency of both  lower extremities 03/31/2020   Vitamin D deficiency 03/06/2020   Prediabetes 03/06/2020   Knee pain, right 04/06/2019   Left ankle sprain 04/06/2019   Personal history of colonic polyps 07/12/2018   Ingrown toenail of both feet 03/23/2017   Hip pain, chronic, right  02/03/2016   GERD (gastroesophageal reflux disease) 12/26/2014   Diverticulosis of colon without hemorrhage 07/05/2014   FH: pancreatic cancer 01/26/2014   FH: glaucoma 03/24/2013   Overweight (BMI 25.0-29.9) 03/13/2012   Hyperlipidemia LDL goal <100 04/03/2009   Essential hypertension 04/03/2009    Conditions to be addressed/monitored:HTN and HLD  Care Plan : RN Care Manager Plan of Care  Updates made by Kassie Mends, RN since 11/30/2021 12:00 AM  Completed 11/30/2021   Problem: No plan of care established for management of chronic disease state  (HTN, HLD) Resolved 11/30/2021  Priority: High     Goal: Development of plan of care for chronic disease management (HTN, HLD) Completed 11/30/2021  Start Date: 07/14/2021  Expected End Date: 01/10/2022  Priority: High  Note:   Current Barriers:  Knowledge Deficits related to plan of care for management of HTN and HLD  Patient reports she lives alone, is independent in all aspects of her care, continues to drive, has a sister nearby she can call on if needed. Patient reports she tries to eat as healthy as possible, has new blood pressure cuff and is now checking blood pressure with "readings excellent"  systolic 156 or below and diastolic 70 or below. 11/30/21- Patient reports she is doing well, blood pressure good, has all medications and taking as prescribed, no new concerns reported today.  RNCM Clinical Goal(s):  Patient will verbalize understanding of plan for management of HTN and HLD as evidenced by patient report, review of EHR and  through collaboration with RN Care manager, provider, and care team.   Interventions: 1:1 collaboration with primary care provider regarding development and update of comprehensive plan of care as evidenced by provider attestation and co-signature Inter-disciplinary care team collaboration (see longitudinal plan of care) Evaluation of current treatment plan related to  self management and patient's adherence  to plan as established by provider   Hyperlipidemia:  (Status: New goal. Goal on Track (progressing): YES.) Long Term Goal  Lab Results  Component Value Date   CHOL 242 (H) 02/02/2021   HDL 95 02/02/2021   [place] 132 (H) 02/02/2021   TRIG 89 02/02/2021   CHOLHDL 2.5 02/02/2021   Medication review performed; medication list updated in electronic medical record.  Counseled on importance of regular laboratory monitoring as prescribed; Reviewed importance of limiting foods high in cholesterol; Reviewed exercise goals and target of 150 minutes per week; Pain assessment completed Reviewed heart healthy diet Reviewed plan of care with patient including case closure today  Hypertension Interventions:  (Status:  New goal. and Goal on track:  Yes.) Long Term Goal Last practice recorded BP readings:  BP Readings from Last 3 Encounters:  02/03/21 123/73  11/04/20 121/81  10/16/20 124/75  Most recent eGFR/CrCl:  Lab Results  Component Value Date   EGFR 63 02/02/2021    No components found for: CRCL  Reviewed medications with patient and discussed importance of compliance Counseled on the importance of exercise goals with target of 150 minutes per week Discussed complications of poorly controlled blood pressure such as heart disease, stroke, circulatory complications, vision complications, kidney impairment, sexual dysfunction  Reinforced importance of adhering to low sodium diet  Patient Goals/Self-Care Activities: Take medications  as prescribed   Attend all scheduled provider appointments Call pharmacy for medication refills 3-7 days in advance of running out of medications Attend church or other social activities Perform all self care activities independently  Perform IADL's (shopping, preparing meals, housekeeping, managing finances) independently Call provider office for new concerns or questions  check blood pressure 3 times per week write blood pressure results in a log or  diary take blood pressure log to all doctor appointments call doctor for signs and symptoms of high blood pressure keep all doctor appointments take medications for blood pressure exactly as prescribed report new symptoms to your doctor eat more whole grains, fruits and vegetables, lean meats and healthy fats - call doctor when you experience any new symptoms - adhere to prescribed diet: heart healthy, low sodium Bake or broil foods instead of frying Avoid trans/ saturated fats- limit fast food Continue following low sodium and heart healthy diet Continue exercising and line dancing- keep up the good work! Case closure today- please talk with your primary care provider if you have any future care management needs       Plan:No further follow up required: case closure today  Jacqlyn Larsen [facility], BSN RN Case Manager [facility] [phone]

## 2021-12-23 ENCOUNTER — Other Ambulatory Visit: Payer: Self-pay | Admitting: Family Medicine

## 2021-12-24 ENCOUNTER — Telehealth: Payer: Medicare Other

## 2021-12-24 DIAGNOSIS — I10 Essential (primary) hypertension: Secondary | ICD-10-CM

## 2021-12-24 DIAGNOSIS — E785 Hyperlipidemia, unspecified: Secondary | ICD-10-CM

## 2022-02-13 IMAGING — DX DG PORTABLE PELVIS
1 series · 1 of 1 positions shown · non-contrast
Comparison: None.

CLINICAL DATA: Status post total right hip replacement

EXAM:
PORTABLE PELVIS AND RIGHT HIP: 1 V

[pelvis ap]
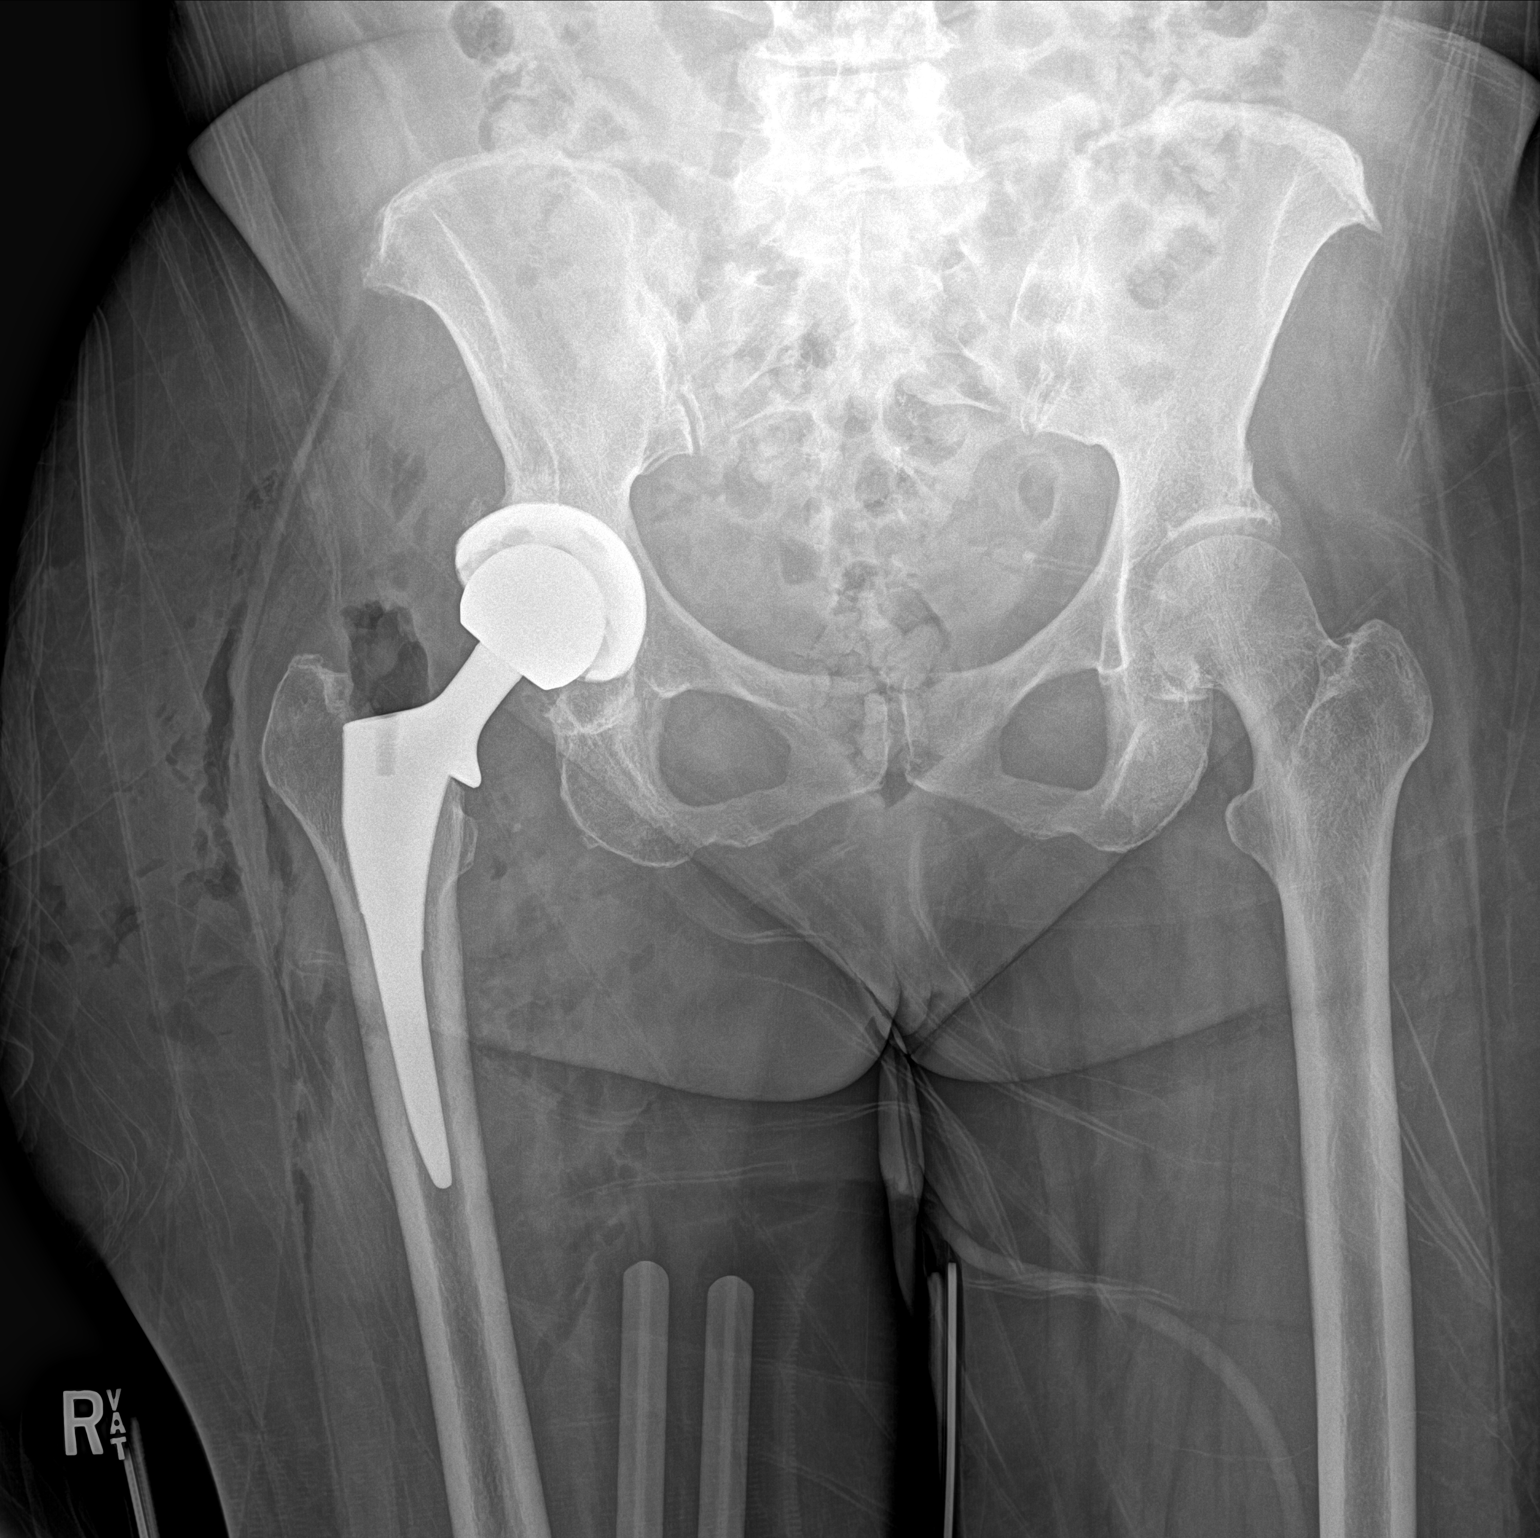

[1 of 1 positions shown; findings below may reference images not displayed]

FINDINGS: Frontal pelvis as well as frontal view of right hip obtained. There
is a total hip replacement right with prosthetic components
well-seated on frontal view. No acute fracture or dislocation.
Slight narrowing left hip joint. Postoperative soft tissue air noted
on the right.
IMPRESSION: Status post total hip replacement right with prosthetic components
well-seated on frontal view. No fracture or dislocation. Mild
narrowing left hip joint. Acute postoperative air noted on the
right.

## 2022-02-13 IMAGING — DX DG HIP (WITH OR WITHOUT PELVIS) 1V PORT*R*
1 series · 1 of 1 positions shown · non-contrast
Comparison: None.

CLINICAL DATA: Status post total right hip replacement

EXAM:
PORTABLE PELVIS AND RIGHT HIP: 1 V

[hip ap]
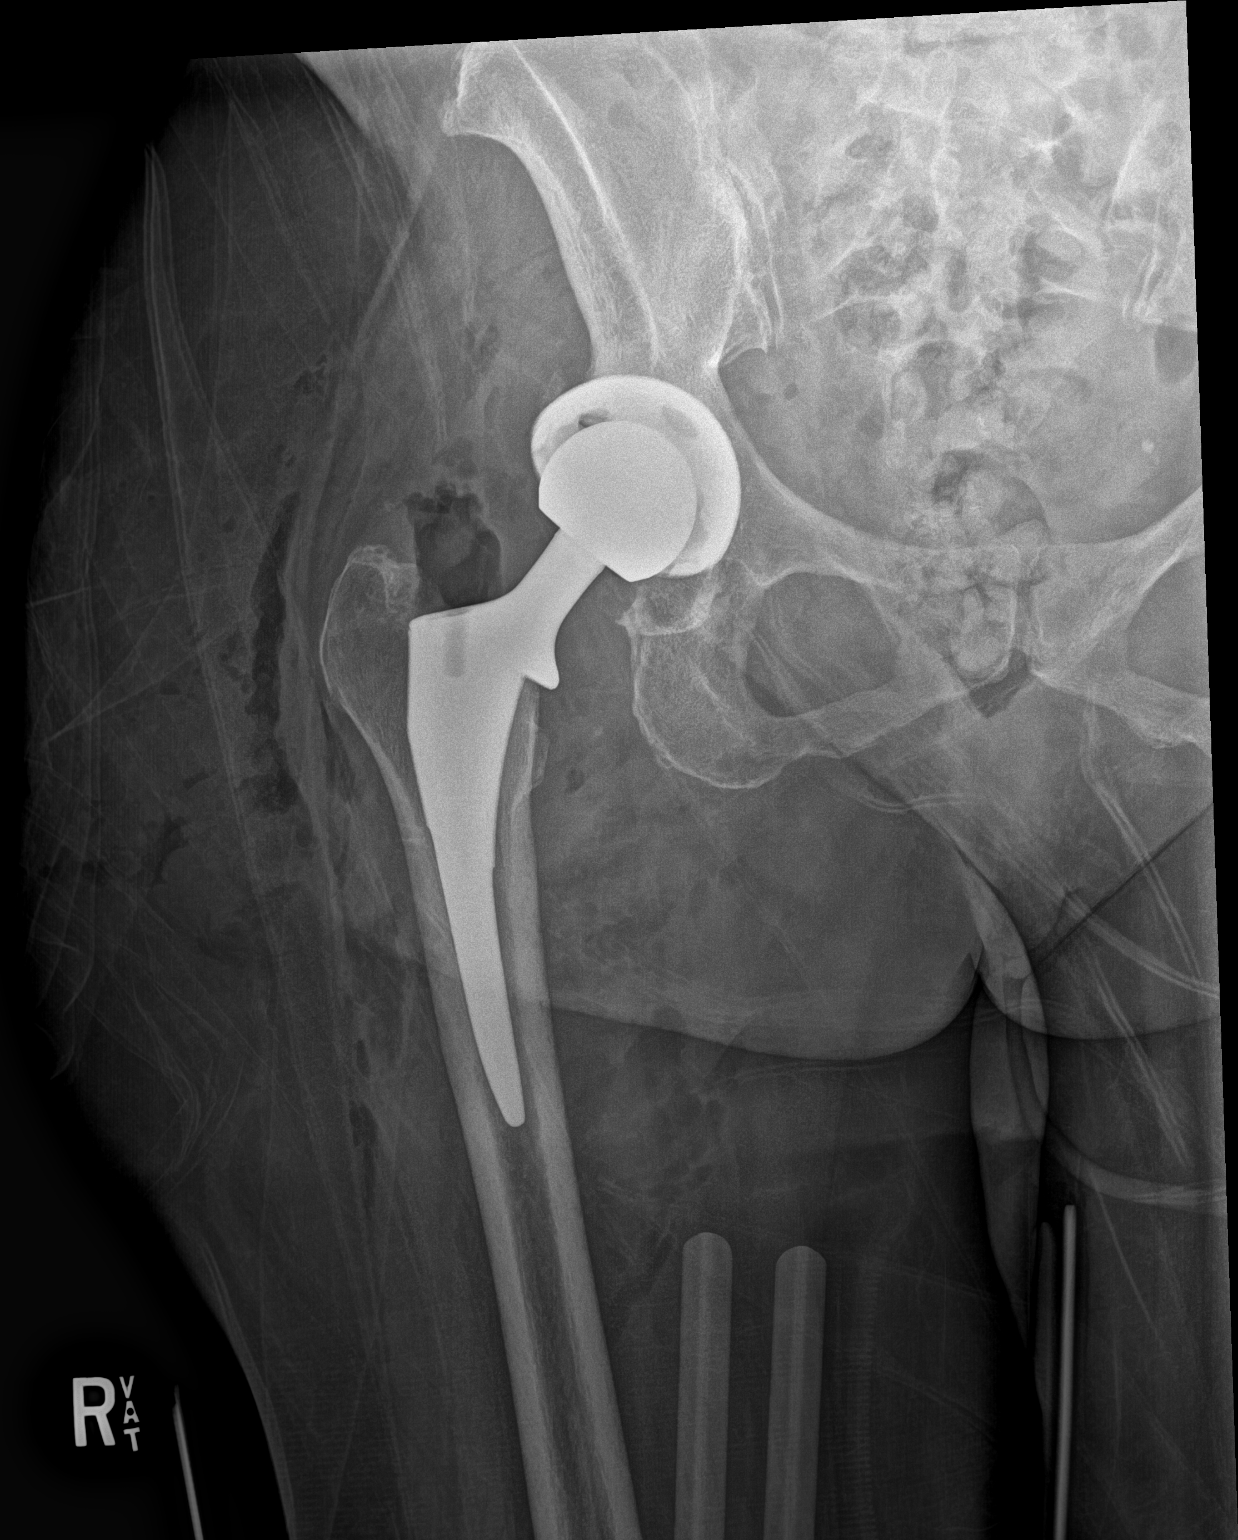

[1 of 1 positions shown; findings below may reference images not displayed]

FINDINGS: Frontal pelvis as well as frontal view of right hip obtained. There
is a total hip replacement right with prosthetic components
well-seated on frontal view. No acute fracture or dislocation.
Slight narrowing left hip joint. Postoperative soft tissue air noted
on the right.
IMPRESSION: Status post total hip replacement right with prosthetic components
well-seated on frontal view. No fracture or dislocation. Mild
narrowing left hip joint. Acute postoperative air noted on the
right.

## 2022-03-08 DIAGNOSIS — E559 Vitamin D deficiency, unspecified: Secondary | ICD-10-CM | POA: Diagnosis not present

## 2022-03-08 DIAGNOSIS — I10 Essential (primary) hypertension: Secondary | ICD-10-CM | POA: Diagnosis not present

## 2022-03-08 DIAGNOSIS — R7303 Prediabetes: Secondary | ICD-10-CM | POA: Diagnosis not present

## 2022-03-08 DIAGNOSIS — E785 Hyperlipidemia, unspecified: Secondary | ICD-10-CM | POA: Diagnosis not present

## 2022-03-09 LAB — CMP14+EGFR
ALT: 16 IU/L (ref 0–32)
AST: 18 IU/L (ref 0–40)
Albumin/Globulin Ratio: 1.5 (ref 1.2–2.2)
Albumin: 4.1 g/dL (ref 3.8–4.8)
Alkaline Phosphatase: 106 IU/L (ref 44–121)
BUN/Creatinine Ratio: 19 (ref 12–28)
BUN: 16 mg/dL (ref 8–27)
Bilirubin Total: 0.4 mg/dL (ref 0.0–1.2)
CO2: 27 mmol/L (ref 20–29)
Calcium: 10.1 mg/dL (ref 8.7–10.3)
Chloride: 101 mmol/L (ref 96–106)
Creatinine, Ser: 0.84 mg/dL (ref 0.57–1.00)
Globulin, Total: 2.7 g/dL (ref 1.5–4.5)
Glucose: 83 mg/dL (ref 70–99)
Potassium: 4.5 mmol/L (ref 3.5–5.2)
Sodium: 139 mmol/L (ref 134–144)
Total Protein: 6.8 g/dL (ref 6.0–8.5)
eGFR: 73 mL/min/{1.73_m2} (ref >59)

## 2022-03-09 LAB — CBC
Hematocrit: 39.5 % (ref 34.0–46.6)
Hemoglobin: 13.1 g/dL (ref 11.1–15.9)
MCH: 29.8 pg (ref 26.6–33.0)
MCHC: 33.2 g/dL (ref 31.5–35.7)
MCV: 90 fL (ref 79–97)
Platelets: 278 10*3/uL (ref 150–450)
RBC: 4.39 x10E6/uL (ref 3.77–5.28)
RDW: 12.8 % (ref 11.7–15.4)
WBC: 6.5 10*3/uL (ref 3.4–10.8)

## 2022-03-09 LAB — LIPID PANEL
Chol/HDL Ratio: 2.8 ratio (ref 0.0–4.4)
Cholesterol, Total: 248 mg/dL — ABNORMAL HIGH (ref 100–199)
HDL: 90 mg/dL (ref >39)
LDL Chol Calc (NIH): 146 mg/dL — ABNORMAL HIGH (ref 0–99)
Triglycerides: 70 mg/dL (ref 0–149)
VLDL Cholesterol Cal: 12 mg/dL (ref 5–40)

## 2022-03-09 LAB — HEMOGLOBIN A1C
Est. average glucose Bld gHb Est-mCnc: 123 mg/dL
Hgb A1c MFr Bld: 5.9 % — ABNORMAL HIGH (ref 4.8–5.6)

## 2022-03-09 LAB — TSH: TSH: 1.73 u[IU]/mL (ref 0.450–4.500)

## 2022-03-09 LAB — VITAMIN D 25 HYDROXY (VIT D DEFICIENCY, FRACTURES): Vit D, 25-Hydroxy: 43.7 ng/mL (ref 30.0–100.0)

## 2022-03-10 ENCOUNTER — Encounter: Payer: Self-pay | Admitting: Family Medicine

## 2022-03-10 ENCOUNTER — Ambulatory Visit (INDEPENDENT_AMBULATORY_CARE_PROVIDER_SITE_OTHER): Payer: Medicare Other | Admitting: Family Medicine

## 2022-03-10 VITALS — BP 113/73 | HR 69 | Ht 65.0 in | Wt 164.0 lb

## 2022-03-10 DIAGNOSIS — E785 Hyperlipidemia, unspecified: Secondary | ICD-10-CM

## 2022-03-10 DIAGNOSIS — R7303 Prediabetes: Secondary | ICD-10-CM

## 2022-03-10 DIAGNOSIS — T733XXA Exhaustion due to excessive exertion, initial encounter: Secondary | ICD-10-CM

## 2022-03-10 DIAGNOSIS — I10 Essential (primary) hypertension: Secondary | ICD-10-CM | POA: Diagnosis not present

## 2022-03-10 DIAGNOSIS — Z1231 Encounter for screening mammogram for malignant neoplasm of breast: Secondary | ICD-10-CM | POA: Diagnosis not present

## 2022-03-10 DIAGNOSIS — E7800 Pure hypercholesterolemia, unspecified: Secondary | ICD-10-CM | POA: Diagnosis not present

## 2022-03-10 DIAGNOSIS — R7302 Impaired glucose tolerance (oral): Secondary | ICD-10-CM

## 2022-03-10 NOTE — Patient Instructions (Addendum)
F/u in 6 months, call if you need me before  Fasting lipid, chem 7 and eGFr and HBA1C 5 to 7 days before appt   Need Covid, Flu ,RSV and TdAP vaccines all are at your pharmacy, pls notify with  message when you get them Nurse visit for flu vaccine  first week in December  Please schedule mammogram at checkout, afternoon appt, [facility]  You are referred to Cardiology  Work on food choice and exeercise as you are doing to lower blood sugar and cholesterol  BP is excellent  Work on incontinence, let me know if an issue  It is important that you exercise regularly at least 30 minutes 5 times a week. If you develop chest pain, have severe difficulty breathing, or feel very tired, stop exercising immediately and seek medical attention   Thanks for choosing [facility], we consider it a privelige to serve you.

## 2022-03-12 ENCOUNTER — Encounter: Payer: Self-pay | Admitting: Family Medicine

## 2022-03-12 DIAGNOSIS — T733XXA Exhaustion due to excessive exertion, initial encounter: Secondary | ICD-10-CM | POA: Insufficient documentation

## 2022-03-12 NOTE — Progress Notes (Signed)
Adrienne [name]     MRN: [id number]      DOB: 04-Aug-1945   HPI Adrienne [name] is here for follow up and re-evaluation of chronic medical conditions, medication management and review of any available recent lab and radiology data.  Preventive health is updated, specifically  Cancer screening and Immunization.   Questions or concerns regarding consultations or procedures which the PT has had in the interim are  addressed. The PT denies any adverse reactions to current medications since the last visit.  C/o new exertional fatigue and exercise intolerance noted in the recent 3 to 4 months. Denies chest pain, has been hypertensive for pover  ROS Denies recent fever or chills. Denies sinus pressure, nasal congestion, ear pain or sore throat. Denies chest congestion, productive cough or wheezing. Denies chest pains, palpitations and leg swelling, PND or orthopnea Denies abdominal pain, nausea, vomiting,diarrhea or constipation.   Denies dysuria, frequency, hesitancy notes mild  incontinence.in recent months, less able to hold urine when she needs to go Denies joint pain, swelling and limitation in mobility. Denies headaches, seizures, numbness, or tingling. Denies depression, anxiety or insomnia. Denies skin break down or rash.   PE  BP 113/73 (BP Location: Right Arm, Patient Position: Sitting, Cuff Size: Normal)   Pulse 69   Ht '5\' 5"'$  (1.651 m)   Wt 164 lb 0.6 oz (74.4 kg)   SpO2 95%   BMI 27.30 kg/m   Patient alert and oriented and in no cardiopulmonary distress.  HEENT: No facial asymmetry, EOMI,     Neck supple .  Chest: Clear to auscultation bilaterally.  CVS: S1, S2 no murmurs, no S3.Regular rate.  .   Ext: No edema  MS: Adequate ROM spine, shoulders, hips and knees.  Skin: Intact, no ulcerations or rash noted.  Psych: Good eye contact, normal affect. Memory intact not anxious or depressed appearing.  CNS: CN 2-12 intact, power,  normal throughout.no focal deficits  noted.   Assessment & Plan  Essential hypertension DASH diet and commitment to daily physical activity for a minimum of 30 minutes discussed and encouraged, as a part of hypertension management. The importance of attaining a healthy weight is also discussed.     03/10/2022    1:04 PM 08/31/2021    1:00 PM 08/18/2021    3:37 PM 08/05/2021    1:07 PM 02/03/2021    1:09 PM 11/04/2020    1:54 PM 10/16/2020    8:16 AM  BP/Weight  Systolic BP 500 938 182 993 716 967 893  Diastolic BP 73 77 74 81 73 81 75  Wt. (Lbs) 164.04 165.08 165 164.12 161 162 163  BMI 27.3 kg/m2 27.47 kg/m2 27.04 kg/m2 27.31 kg/m2 26.38 kg/m2 26.55 kg/m2 26.71 kg/m2     Controlled, no change in medication   Prediabetes Deteriorated Patient educated about the importance of limiting  Carbohydrate intake , the need to commit to daily physical activity for a minimum of 30 minutes , and to commit weight loss. The fact that changes in all these areas will reduce or eliminate all together the development of diabetes is stressed.      Latest Ref Rng & Units 03/08/2022   10:14 AM 08/03/2021    1:27 PM 02/02/2021   10:49 AM 05/07/2020   11:30 AM 03/05/2020    3:14 PM  Diabetic Labs  HbA1c 4.8 - 5.6 % 5.9  5.8  6.1     Chol 100 - 199 mg/dL 248  246  242  249   HDL >39 mg/dL 90  108  95   111   Calc LDL 0 - 99 mg/dL 146  126  132   125   Triglycerides 0 - 149 mg/dL 70  75  89   80   Creatinine 0.57 - 1.00 mg/dL 0.84  1.03  0.95  0.86  0.88       03/10/2022    1:04 PM 08/31/2021    1:00 PM 08/18/2021    3:37 PM 08/05/2021    1:07 PM 02/03/2021    1:09 PM 11/04/2020    1:54 PM 10/16/2020    8:16 AM  BP/Weight  Systolic BP 364 680 321 224 825 003 704  Diastolic BP 73 77 74 81 73 81 75  Wt. (Lbs) 164.04 165.08 165 164.12 161 162 163  BMI 27.3 kg/m2 27.47 kg/m2 27.04 kg/m2 27.31 kg/m2 26.38 kg/m2 26.55 kg/m2 26.71 kg/m2       No data to display            Hyperlipidemia LDL goal <100 Hyperlipidemia:Low fat  diet discussed and encouraged.   Lipid Panel  Lab Results  Component Value Date   CHOL 248 (H) 03/08/2022   HDL 90 03/08/2022   LDLCALC 146 (H) 03/08/2022   TRIG 70 03/08/2022   CHOLHDL 2.8 03/08/2022     Needs to reduce dietary fat  Fatigue due to excessive exertion New onset exertional fatigue with sevral risk factors for CAD, refer for cardiology eval, needs echo, has never had one

## 2022-03-12 NOTE — Assessment & Plan Note (Signed)
DASH diet and commitment to daily physical activity for a minimum of 30 minutes discussed and encouraged, as a part of hypertension management. The importance of attaining a healthy weight is also discussed.     03/10/2022    1:04 PM 08/31/2021    1:00 PM 08/18/2021    3:37 PM 08/05/2021    1:07 PM 02/03/2021    1:09 PM 11/04/2020    1:54 PM 10/16/2020    8:16 AM  BP/Weight  Systolic BP 832 919 166 060 045 997 741  Diastolic BP 73 77 74 81 73 81 75  Wt. (Lbs) 164.04 165.08 165 164.12 161 162 163  BMI 27.3 kg/m2 27.47 kg/m2 27.04 kg/m2 27.31 kg/m2 26.38 kg/m2 26.55 kg/m2 26.71 kg/m2     Controlled, no change in medication

## 2022-03-12 NOTE — Assessment & Plan Note (Signed)
New onset exertional fatigue with sevral risk factors for CAD, refer for cardiology eval, needs echo, has never had one

## 2022-03-12 NOTE — Assessment & Plan Note (Signed)
Hyperlipidemia:Low fat diet discussed and encouraged.   Lipid Panel  Lab Results  Component Value Date   CHOL 248 (H) 03/08/2022   HDL 90 03/08/2022   LDLCALC 146 (H) 03/08/2022   TRIG 70 03/08/2022   CHOLHDL 2.8 03/08/2022     Needs to reduce dietary fat

## 2022-03-12 NOTE — Assessment & Plan Note (Signed)
Deteriorated Patient educated about the importance of limiting  Carbohydrate intake , the need to commit to daily physical activity for a minimum of 30 minutes , and to commit weight loss. The fact that changes in all these areas will reduce or eliminate all together the development of diabetes is stressed.      Latest Ref Rng & Units 03/08/2022   10:14 AM 08/03/2021    1:27 PM 02/02/2021   10:49 AM 05/07/2020   11:30 AM 03/05/2020    3:14 PM  Diabetic Labs  HbA1c 4.8 - 5.6 % 5.9  5.8  6.1     Chol 100 - 199 mg/dL 248  246  242   249   HDL >39 mg/dL 90  108  95   111   Calc LDL 0 - 99 mg/dL 146  126  132   125   Triglycerides 0 - 149 mg/dL 70  75  89   80   Creatinine 0.57 - 1.00 mg/dL 0.84  1.03  0.95  0.86  0.88       03/10/2022    1:04 PM 08/31/2021    1:00 PM 08/18/2021    3:37 PM 08/05/2021    1:07 PM 02/03/2021    1:09 PM 11/04/2020    1:54 PM 10/16/2020    8:16 AM  BP/Weight  Systolic BP 026 378 588 502 774 128 786  Diastolic BP 73 77 74 81 73 81 75  Wt. (Lbs) 164.04 165.08 165 164.12 161 162 163  BMI 27.3 kg/m2 27.47 kg/m2 27.04 kg/m2 27.31 kg/m2 26.38 kg/m2 26.55 kg/m2 26.71 kg/m2       No data to display

## 2022-03-21 ENCOUNTER — Encounter: Payer: Self-pay | Admitting: Family Medicine

## 2022-03-22 ENCOUNTER — Ambulatory Visit (INDEPENDENT_AMBULATORY_CARE_PROVIDER_SITE_OTHER): Payer: Medicare Other | Admitting: Internal Medicine

## 2022-03-22 ENCOUNTER — Encounter: Payer: Self-pay | Admitting: Internal Medicine

## 2022-03-22 DIAGNOSIS — U071 COVID-19: Secondary | ICD-10-CM | POA: Diagnosis not present

## 2022-03-22 MED ORDER — GUAIFENESIN ER 600 MG PO TB12: 600.0000 mg | ORAL_TABLET | Freq: Two times a day (BID) | ORAL | 0 refills | Status: DC | PRN

## 2022-03-22 NOTE — Telephone Encounter (Signed)
Please schedule

## 2022-03-22 NOTE — Assessment & Plan Note (Signed)
Mild to moderate covid infection which is improving on day 3 of infection. Risk factor of >65. Discussed Paxlovid, and decided not to prescribe as patient is already improving. Will prescribe Mucinex to help with congestion. Patient given return precautions.

## 2022-03-22 NOTE — Patient Instructions (Signed)
Thank you for trusting me with your care. To recap, today we discussed the following:  COVID-19 - guaiFENesin (MUCINEX) 600 MG 12 hr tablet; Take 1 tablet (600 mg total) by mouth 2 (two) times daily as needed.  Dispense: 24 tablet; Refill: 0 - I am glad you are starting to improve. The medication above will help with congestion.  If you worsen please contact our office or go to the emergency room.

## 2022-03-22 NOTE — Progress Notes (Signed)
     This is a telephone encounter between Adrienne [name] and Lorene Dy on 03/22/2022 for Covid-19. The visit was conducted with the patient located at home and Lorene Dy at [facility]. The patient's identity was confirmed using their DOB and current address. The patient has consented to being evaluated through a telephone encounter and understands the associated risks (an examination cannot be done and the patient may need to come in for an appointment) / benefits (allows the patient to remain at home, decreasing exposure to coronavirus).    CC: Covid-19  HPI:Ms.Adrienne [name] is a 76 y.o. female who presents for evaluation of Covid-19 symptoms. Patient's symptoms started on Saturday and she tested positive for Covid on Saturday. Her symptoms are coughing, low energy, and some lost of appetite. She is using vapor rub, using cough drops, drinking tea, taking airborne, and  vitamin C . Her symptoms started to improve today.   Patient 08/27/2020 patient vaccinated.  Past Medical History:  Diagnosis Date   Allergy    Arthritis    Diverticulosis 05/2009   GERD (gastroesophageal reflux disease)    Hyperlipidemia    Hypertension      Assessment & Plan:   COVID-19 Mild to moderate covid infection which is improving on day 3 of infection. Risk factor of >65. Discussed Paxlovid, and decided not to prescribe as patient is already improving. Will prescribe Mucinex to help with congestion. Patient given return precautions.    Time:   Today, I have spent 15 minutes with the patient with telehealth technology discussing the above problems.  Lorene Dy, MD

## 2022-03-26 ENCOUNTER — Encounter: Payer: Self-pay | Admitting: Family Medicine

## 2022-03-26 DIAGNOSIS — R051 Acute cough: Secondary | ICD-10-CM

## 2022-03-26 DIAGNOSIS — R0781 Pleurodynia: Secondary | ICD-10-CM

## 2022-03-26 DIAGNOSIS — U071 COVID-19: Secondary | ICD-10-CM

## 2022-03-26 MED ORDER — PROMETHAZINE-DM 6.25-15 MG/5ML PO SYRP: ORAL_SOLUTION | ORAL | 0 refills | Status: DC

## 2022-03-26 NOTE — Telephone Encounter (Signed)
Please advise 

## 2022-03-26 NOTE — Telephone Encounter (Signed)
Spoke with pt advised of message pt verbalized understanding

## 2022-03-27 NOTE — Addendum Note (Signed)
Addended by: Tula Nakayama E on: 03/27/2022 12:46 PM   Modules accepted: Orders

## 2022-03-30 ENCOUNTER — Ambulatory Visit (HOSPITAL_COMMUNITY)
Admission: RE | Admit: 2022-03-30 | Discharge: 2022-03-30 | Disposition: A | Discharge status: Home / Self Care | Payer: Medicare Other | Source: Ambulatory Visit | Attending: Family Medicine | Admitting: Family Medicine

## 2022-03-30 DIAGNOSIS — J9811 Atelectasis: Secondary | ICD-10-CM | POA: Diagnosis not present

## 2022-03-30 DIAGNOSIS — R0781 Pleurodynia: Secondary | ICD-10-CM | POA: Diagnosis not present

## 2022-03-30 DIAGNOSIS — R051 Acute cough: Secondary | ICD-10-CM | POA: Diagnosis not present

## 2022-03-30 DIAGNOSIS — R079 Chest pain, unspecified: Secondary | ICD-10-CM | POA: Diagnosis not present

## 2022-03-30 DIAGNOSIS — R059 Cough, unspecified: Secondary | ICD-10-CM | POA: Diagnosis not present

## 2022-04-15 DIAGNOSIS — H401131 Primary open-angle glaucoma, bilateral, mild stage: Secondary | ICD-10-CM | POA: Diagnosis not present

## 2022-04-15 DIAGNOSIS — H524 Presbyopia: Secondary | ICD-10-CM | POA: Diagnosis not present

## 2022-04-27 ENCOUNTER — Ambulatory Visit (INDEPENDENT_AMBULATORY_CARE_PROVIDER_SITE_OTHER): Payer: Medicare Other

## 2022-04-27 DIAGNOSIS — Z23 Encounter for immunization: Secondary | ICD-10-CM

## 2022-05-25 ENCOUNTER — Ambulatory Visit: Payer: Medicare Other | Attending: Cardiology | Admitting: Cardiology

## 2022-05-25 ENCOUNTER — Encounter: Payer: Self-pay | Admitting: Cardiology

## 2022-05-25 VITALS — BP 115/78 | HR 65 | Ht 65.5 in | Wt 163.8 lb

## 2022-05-25 DIAGNOSIS — I10 Essential (primary) hypertension: Secondary | ICD-10-CM | POA: Insufficient documentation

## 2022-05-25 DIAGNOSIS — E782 Mixed hyperlipidemia: Secondary | ICD-10-CM | POA: Diagnosis not present

## 2022-05-25 DIAGNOSIS — R0609 Other forms of dyspnea: Secondary | ICD-10-CM | POA: Diagnosis not present

## 2022-05-25 DIAGNOSIS — R9431 Abnormal electrocardiogram [ECG] [EKG]: Secondary | ICD-10-CM | POA: Diagnosis not present

## 2022-05-25 NOTE — Progress Notes (Signed)
Cardiology Office Note  Date: 05/25/2022   ID: Adrienne [name], DOB 12-06-1945, MRN [id number]  PCP:  Fayrene Helper, MD  Cardiologist:  Rozann Lesches, MD Electrophysiologist:  None   Chief Complaint  Patient presents with   Shortness of Breath    History of Present Illness: Adrienne [name] is a 77 y.o. female referred for cardiology consultation by Dr. Moshe Cipro for evaluation of exertional dyspnea.  She states that over the last 3 to 4 months she has noticed more shortness of breath with activities, walking outdoors, up or down an incline, not necessarily associated with chest tightness.  Symptoms resolved fairly quickly when she stops.  No associated palpitations or syncope.  She does not recall any prior history of cardiac disease, lived in [place] for a period of time, but has been back here 12-13 years.  States that she used to always get a yearly ECG.  She does have hypertension and diet managed hyperlipidemia.  Reports compliance with her medications and states that her systolic blood pressure is generally 115-120 at home.  I personally reviewed her ECG today which shows sinus rhythm with increased voltage and poor R wave progression anterolaterally.  She has not undergone any recent cardiac structural or ischemic testing.  States that she had COVID 19 back in November 2023, but not necessarily associated with her current symptoms.  Past Medical History:  Diagnosis Date   Allergy    Arthritis    Diverticulosis 05/2009   GERD (gastroesophageal reflux disease)    Hyperlipidemia    Hypertension     Past Surgical History:  Procedure Laterality Date   ABDOMINAL HYSTERECTOMY  1990   for fibroids ovaries remain    CHOLECYSTECTOMY  1980   COLONOSCOPY  05/2009   rare diverticula, small internal hemorrhoids. Next colonoscopy in 5-10 yrs.   COLONOSCOPY N/A 10/23/2018   Procedure: COLONOSCOPY;  Surgeon: Danie Binder, MD;  Location: [facility];  Service: Endoscopy;   Laterality: N/A;  2:00pm   JOINT REPLACEMENT N/A    [employer]reesia 06/02/2020   left nipple inversion bx benign  2000   POLYPECTOMY  10/23/2018   Procedure: POLYPECTOMY;  Surgeon: Danie Binder, MD;  Location: [facility];  Service: Endoscopy;;   TOTAL HIP ARTHROPLASTY Right 05/16/2020   Procedure: TOTAL HIP ARTHROPLASTY;  Surgeon: Earlie Server, MD;  Location: [facility];  Service: Orthopedics;  Laterality: Right;    Current Outpatient Medications  Medication Sig Dispense Refill   acetaminophen (TYLENOL) 650 MG CR tablet Take 2 tablets (1,300 mg total) by mouth daily as needed for pain. 100 tablet 0   amLODipine (NORVASC) 2.5 MG tablet TAKE 1 TABLET EVERY DAY 90 tablet 2   Biotin 10 MG CAPS Take 10 mg by mouth every evening.     guaiFENesin (MUCINEX) 600 MG 12 hr tablet Take 1 tablet (600 mg total) by mouth 2 (two) times daily as needed. 24 tablet 0   Latanoprost 0.005 % EMUL Apply to eye.     Lidocaine-Menthol (ICY HOT LIDOCAINE PLUS MENTHOL EX) Apply 1 application. topically 4 (four) times daily as needed (pain.). Icy Hot Cream with Lidocaine plus Menthol Cream     Liniments (BLUE-EMU SUPER STRENGTH EX) Apply 1 application. topically 4 (four) times daily as needed (pain.).     Liniments (PAIN RELIEF EX) Apply 1 application. topically 4 (four) times daily as needed (pain.). Triderma Pain Relief Cream     loratadine (CLARITIN) 10 MG tablet Take 10 mg by mouth daily as  needed for allergies.     Menthol, Topical Analgesic, (ICE BLUE EX) Apply 1 application. topically 4 (four) times daily as needed (pain.).     Menthol-Methyl Salicylate (MUSCLE RUB) 10-15 % CREA Apply 1 application. topically as needed for muscle pain.     Multiple Minerals-Vitamins (CAL MAG ZINC +D3 PO) Take 1 tablet by mouth every evening.     Multiple Vitamin (MULTIVITAMIN WITH MINERALS) TABS tablet Take 1 tablet by mouth every evening. Women's Multivitamin 50+     promethazine-dextromethorphan (PROMETHAZINE-DM) 6.25-15 MG/5ML  syrup Half to one teaspoone one tol two times daily, as needed, for excessive cough 240 mL 0   triamterene-hydrochlorothiazide (MAXZIDE-25) 37.5-25 MG tablet TAKE 1 TABLET EVERY DAY 90 tablet 2   TURMERIC PO Take 450 mg by mouth every evening.     No current facility-administered medications for this visit.   Allergies:  Valsartan and Ace inhibitors   Social History: The patient  reports that she has never smoked. She has never used smokeless tobacco. She reports that she does not currently use alcohol. She reports that she does not use drugs.   Family History: The patient's family history includes Arthritis in her brother, father, mother, sister, and sister; Breast cancer in her sister; Diabetes in her sister; Heart disease in her sister; Hypertension in her sister; Kidney failure in her father; Lung cancer in her brother; Prostate cancer in her brother.   ROS: No orthopnea or PND.  No leg swelling.  Physical Exam: VS:  BP 115/78   Pulse 65   Ht 5' 5.5" (1.664 m)   Wt 163 lb 12.8 oz (74.3 kg)   SpO2 98%   BMI 26.84 kg/m , BMI Body mass index is 26.84 kg/m.  Wt Readings from Last 3 Encounters:  05/25/22 163 lb 12.8 oz (74.3 kg)  03/10/22 164 lb 0.6 oz (74.4 kg)  08/31/21 165 lb 1.3 oz (74.9 kg)    General: Patient appears comfortable at rest. HEENT: Conjunctiva and lids normal. Neck: Supple, no elevated JVP or carotid bruits. Lungs: Clear to auscultation, nonlabored breathing at rest. Cardiac: Regular rate and rhythm, no S3 or significant systolic murmur, no pericardial rub. Abdomen: Soft, nontender, bowel sounds present. Extremities: No pitting edema, distal pulses 2+. Skin: Warm and dry. Musculoskeletal: No kyphosis. Neuropsychiatric: Alert and oriented x3, affect grossly appropriate.  ECG:  An ECG dated 03/04/2020 was personally reviewed today and demonstrated:  Sinus bradycardia with LVH, decreased R wave progression.  Recent Labwork: 03/08/2022: ALT 16; AST 18; BUN 16;  Creatinine, Ser 0.84; Hemoglobin 13.1; Platelets 278; Potassium 4.5; Sodium 139; TSH 1.730     Component Value Date/Time   CHOL 248 (H) 03/08/2022 1014   TRIG 70 03/08/2022 1014   HDL 90 03/08/2022 1014   CHOLHDL 2.8 03/08/2022 1014   CHOLHDL 2.6 04/02/2019 0952   VLDL 13 09/23/2016 1007   LDLCALC 146 (H) 03/08/2022 1014   LDLCALC 138 (H) 04/02/2019 0952    Other Studies Reviewed Today:  Chest x-ray 03/30/2022: FINDINGS: Upper normal size of cardiac silhouette.   Mediastinal contours and pulmonary vascularity normal.   Atherosclerotic calcification aorta.   Subsegmental atelectasis at lung bases greater on LEFT.   No acute infiltrate, pleural effusion, or pneumothorax.   Osseous structures unremarkable.   IMPRESSION: Mild bibasilar atelectasis.   Aortic Atherosclerosis (ICD10-I70.0).  Assessment and Plan:  1.  Dyspnea on exertion as discussed above.  Baseline ECG shows LVH and decreased R wave progression laterally, could be associated with history of  hypertension, but further cardiac structural and ischemic testing is warranted in light of new exertional symptoms.  She reports no known history of ischemic heart disease.  Plan to obtain an exercise Myoview and an echocardiogram.  2.  Essential hypertension, on Norvasc and Maxide with good blood pressure control based on recent measurements.  3.  Diet managed hyperlipidemia, follows with Dr. Moshe Cipro.  She states that she has not been on any specific medical therapy for this over time.  Last LDL was 146 in November 2023.  I see that chest x-ray from December 2023 incidentally mentioned aortic atherosclerosis.  In this setting, could consider at least low-dose statin therapy for further risk reduction.  Medication Adjustments/Labs and Tests Ordered: Current medicines are reviewed at length with the patient today.  Concerns regarding medicines are outlined above.   Tests Ordered: Orders Placed This Encounter  Procedures    NM Myocar Multi W/Spect W/Wall Motion / EF   EKG 12-Lead   ECHOCARDIOGRAM COMPLETE    Medication Changes: No orders of the defined types were placed in this encounter.   Disposition:  Follow up  test results.  Signed, Satira Sark, MD, [facility] 05/25/2022 3:25 PM    [place] at [facility] [address], [place], [place] [address] Phone: [phone]; Fax: [phone]

## 2022-05-25 NOTE — Patient Instructions (Addendum)
Medication Instructions: Your physician recommends that you continue on your current medications as directed. Please refer to the Current Medication list given to you today.   HOLD AMLODIPINE THE MORNING OF STRESS TEST   Labwork:  NONE TODAY   Testing/Procedures  Your physician has requested that you have an echocardiogram. Echocardiography is a painless test that uses sound waves to create images of your heart. It provides your doctor with information about the size and shape of your heart and how well your heart's chambers and valves are working. This procedure takes approximately one hour. There are no restrictions for this procedure. Please do NOT wear cologne, perfume, aftershave, or lotions (deodorant is allowed). Please arrive 15 minutes prior to your appointment time.    Your physician has requested that you have a EXERCISE myoview. For further information please visit [email or web address]. Please follow instruction sheet, as given.    Follow-Up:  WE WILL CALL YOU WITH RESULTS    Any Other Special Instructions Will Be Listed Below (If Applicable).  If you need a refill on your cardiac medications before your next appointment, please call your pharmacy.

## 2022-06-01 ENCOUNTER — Ambulatory Visit (HOSPITAL_COMMUNITY)
Admission: RE | Admit: 2022-06-01 | Discharge: 2022-06-01 | Disposition: A | Discharge status: Home / Self Care | Payer: Medicare Other | Source: Ambulatory Visit | Attending: Cardiology | Admitting: Cardiology

## 2022-06-01 DIAGNOSIS — R9431 Abnormal electrocardiogram [ECG] [EKG]: Secondary | ICD-10-CM | POA: Insufficient documentation

## 2022-06-01 DIAGNOSIS — R0609 Other forms of dyspnea: Secondary | ICD-10-CM

## 2022-06-01 LAB — NM MYOCAR MULTI W/SPECT W/WALL MOTION / EF
Angina Index: 0
Duke Treadmill Score: -1
Estimated workload: 5.9
Exercise duration (min): 3 min
Exercise duration (sec): 34 s
LV dias vol: 36 mL (ref 46–106)
LV sys vol: 7 mL
MPHR: 146 {beats}/min
Nuc Stress EF: 82 %
Peak HR: 136 {beats}/min
Percent HR: 93 %
RATE: 0.4
RPE: 15
Rest HR: 57 {beats}/min
Rest Nuclear Isotope Dose: 11 mCi
SDS: 2
SRS: 3
SSS: 5
ST Depression (mm): 1 mm
Stress Nuclear Isotope Dose: 33 mCi
TID: 0.8

## 2022-06-01 MED ORDER — TECHNETIUM TC 99M TETROFOSMIN IV KIT
30.0000 | PACK | Freq: Once | INTRAVENOUS | Status: AC | PRN
  Administered 2022-06-01: 33 via INTRAVENOUS

## 2022-06-01 MED ORDER — TECHNETIUM TC 99M TETROFOSMIN IV KIT
10.0000 | PACK | Freq: Once | INTRAVENOUS | Status: AC | PRN
  Administered 2022-06-01: 11 via INTRAVENOUS

## 2022-06-01 MED ORDER — SODIUM CHLORIDE FLUSH 0.9 % IV SOLN
INTRAVENOUS | Status: AC
  Administered 2022-06-01: 10 mL via INTRAVENOUS
  Filled 2022-06-01: qty 10

## 2022-06-01 MED ORDER — REGADENOSON 0.4 MG/5ML IV SOLN
INTRAVENOUS | Status: AC
  Administered 2022-06-01: 0.4 mg via INTRAVENOUS
  Filled 2022-06-01: qty 5

## 2022-06-08 DIAGNOSIS — Z23 Encounter for immunization: Secondary | ICD-10-CM | POA: Diagnosis not present

## 2022-06-09 ENCOUNTER — Telehealth: Payer: Self-pay

## 2022-06-09 ENCOUNTER — Ambulatory Visit (HOSPITAL_COMMUNITY)
Admission: RE | Admit: 2022-06-09 | Discharge: 2022-06-09 | Disposition: A | Discharge status: Home / Self Care | Payer: Medicare Other | Source: Ambulatory Visit | Attending: Cardiology | Admitting: Cardiology

## 2022-06-09 DIAGNOSIS — R0609 Other forms of dyspnea: Secondary | ICD-10-CM | POA: Diagnosis not present

## 2022-06-09 DIAGNOSIS — R9431 Abnormal electrocardiogram [ECG] [EKG]: Secondary | ICD-10-CM

## 2022-06-09 LAB — ECHOCARDIOGRAM COMPLETE
Area-P 1/2: 3.72 cm2
S' Lateral: 2.4 cm

## 2022-06-09 NOTE — Telephone Encounter (Signed)
Patient notified and verbalized understanding. Patient had no questions or concerns at this time.  

## 2022-06-09 NOTE — Progress Notes (Signed)
*  PRELIMINARY RESULTS* Echocardiogram 2D Echocardiogram has been performed.  Adrienne [name] 06/09/2022, 3:43 PM

## 2022-06-09 NOTE — Telephone Encounter (Signed)
-----   Message from Satira Sark, MD sent at 06/09/2022  3:56 PM EST ----- Results reviewed.  LVEF normal at 60 to 65%, no major valvular abnormalities.  Would keep follow-up with Dr. Moshe Cipro as discussed previously.

## 2022-07-03 LAB — AMB RESULTS CONSOLE CBG: Glucose: 87

## 2022-07-03 NOTE — Progress Notes (Signed)
Pt has PCP. Pt is not fasting. Rechecked BP, pt will follow up with PCP

## 2022-07-09 ENCOUNTER — Encounter: Payer: Self-pay | Admitting: *Deleted

## 2022-07-09 NOTE — Progress Notes (Signed)
Pt attended 07/03/22 screening event when initial b/p was 155/94; on recheck her b/p was 134/84. Pt was instructed by event RN to f/u with PCP. In-basket message to PCP also sent today. Pt did not identify any SDOH barriers at the event. She has seen her PCP w/in the past rolling 12 months, with cardiology f/u, and has future appt with PCP on 09/06/22. No additional health equity team support indicated at this time.

## 2022-07-14 ENCOUNTER — Other Ambulatory Visit: Payer: Self-pay | Admitting: Family Medicine

## 2022-08-16 ENCOUNTER — Ambulatory Visit (HOSPITAL_COMMUNITY)
Admission: RE | Admit: 2022-08-16 | Discharge: 2022-08-16 | Disposition: A | Discharge status: Home / Self Care | Payer: Medicare Other | Source: Ambulatory Visit | Attending: Family Medicine | Admitting: Family Medicine

## 2022-08-16 DIAGNOSIS — Z1231 Encounter for screening mammogram for malignant neoplasm of breast: Secondary | ICD-10-CM | POA: Diagnosis not present

## 2022-09-01 ENCOUNTER — Other Ambulatory Visit: Payer: Self-pay | Admitting: Family Medicine

## 2022-09-01 ENCOUNTER — Encounter: Payer: Self-pay | Admitting: Family Medicine

## 2022-09-03 DIAGNOSIS — R7302 Impaired glucose tolerance (oral): Secondary | ICD-10-CM | POA: Diagnosis not present

## 2022-09-03 DIAGNOSIS — I10 Essential (primary) hypertension: Secondary | ICD-10-CM | POA: Diagnosis not present

## 2022-09-03 DIAGNOSIS — E785 Hyperlipidemia, unspecified: Secondary | ICD-10-CM | POA: Diagnosis not present

## 2022-09-04 LAB — BMP8+EGFR
BUN/Creatinine Ratio: 12 (ref 12–28)
BUN: 10 mg/dL (ref 8–27)
CO2: 24 mmol/L (ref 20–29)
Calcium: 9.9 mg/dL (ref 8.7–10.3)
Chloride: 102 mmol/L (ref 96–106)
Creatinine, Ser: 0.82 mg/dL (ref 0.57–1.00)
Glucose: 85 mg/dL (ref 70–99)
Potassium: 4.1 mmol/L (ref 3.5–5.2)
Sodium: 141 mmol/L (ref 134–144)
eGFR: 75 mL/min/{1.73_m2} (ref >59)

## 2022-09-04 LAB — LIPID PANEL
Chol/HDL Ratio: 2.6 ratio (ref 0.0–4.4)
Cholesterol, Total: 248 mg/dL — ABNORMAL HIGH (ref 100–199)
HDL: 97 mg/dL (ref >39)
LDL Chol Calc (NIH): 125 mg/dL — ABNORMAL HIGH (ref 0–99)
Triglycerides: 151 mg/dL — ABNORMAL HIGH (ref 0–149)
VLDL Cholesterol Cal: 26 mg/dL (ref 5–40)

## 2022-09-04 LAB — HEMOGLOBIN A1C
Est. average glucose Bld gHb Est-mCnc: 120 mg/dL
Hgb A1c MFr Bld: 5.8 % — ABNORMAL HIGH (ref 4.8–5.6)

## 2022-09-07 ENCOUNTER — Encounter: Payer: Self-pay | Admitting: Internal Medicine

## 2022-09-07 ENCOUNTER — Ambulatory Visit (INDEPENDENT_AMBULATORY_CARE_PROVIDER_SITE_OTHER): Payer: Medicare Other | Admitting: Internal Medicine

## 2022-09-07 DIAGNOSIS — Z0000 Encounter for general adult medical examination without abnormal findings: Secondary | ICD-10-CM

## 2022-09-07 NOTE — Patient Instructions (Signed)
  Adrienne [name] , Thank you for taking time to come for your Medicare Wellness Visit. I appreciate your ongoing commitment to your health goals. Please review the following plan we discussed and let me know if I can assist you in the future.   These are the goals we discussed:  Goals       Patient Stated (pt-stated)      Increase exercise to 30 minutes a day x 5 days a week. She line dances 90 minutes one time per week.         This is a list of the screening recommended for you and due dates:  Health Maintenance  Topic Date Due   DTaP/Tdap/Td vaccine (2 - Tdap) 04/04/2019   COVID-19 Vaccine (3 - Moderna risk series) 09/24/2020   Medicare Annual Wellness Visit  09/01/2022   Flu Shot  11/25/2022   Colon Cancer Screening  10/23/2023   Mammogram  08/15/2024   Pneumonia Vaccine  Completed   DEXA scan (bone density measurement)  Completed   Hepatitis C Screening: USPSTF Recommendation to screen - Ages 62-79 yo.  Completed   Zoster (Shingles) Vaccine  Completed   HPV Vaccine  Aged Out

## 2022-09-07 NOTE — Progress Notes (Signed)
Subjective:  This is a telephone encounter between Adrienne [name] and Milus Banister on 09/07/2022 for AWV. The visit was conducted with the patient located at home and Milus Banister at [facility]. The patient's identity was confirmed using their DOB and current address. The patient has consented to being evaluated through a telephone encounter and understands the associated risks (an examination cannot be done and the patient may need to come in for an appointment) / benefits (allows the patient to remain at home, decreasing exposure to coronavirus).     Adrienne [name] is a 77 y.o. female who presents for Medicare Annual (Subsequent) preventive examination.  Review of Systems    Review of Systems  All other systems reviewed and are negative.   Objective:    There were no vitals filed for this visit. There is no height or weight on file to calculate BMI.     09/07/2022    1:47 PM 08/31/2021    1:08 PM 07/14/2021    1:11 PM 02/20/2021    4:15 PM 08/26/2020   11:35 AM 06/02/2020    2:29 PM 05/16/2020    6:33 AM  Advanced Directives  Does Patient Have a Medical Advance Directive? No No No No No No No  Would patient like information on creating a medical advance directive?  No - Patient declined No - Patient declined No - Patient declined Yes (ED - Information included in AVS) No - Patient declined No - Patient declined    Current Medications (verified) Outpatient Encounter Medications as of 09/07/2022  Medication Sig   acetaminophen (TYLENOL) 650 MG CR tablet Take 2 tablets (1,300 mg total) by mouth daily as needed for pain.   amLODipine (NORVASC) 2.5 MG tablet TAKE 1 TABLET (2.5 MG TOTAL) BY MOUTH DAILY.   Biotin 10 MG CAPS Take 10 mg by mouth every evening.   guaiFENesin (MUCINEX) 600 MG 12 hr tablet Take 1 tablet (600 mg total) by mouth 2 (two) times daily as needed.   Latanoprost 0.005 % EMUL Apply to eye.   Lidocaine-Menthol (ICY HOT LIDOCAINE PLUS MENTHOL EX) Apply 1 application. topically  4 (four) times daily as needed (pain.). Icy Hot Cream with Lidocaine plus Menthol Cream   Liniments (BLUE-EMU SUPER STRENGTH EX) Apply 1 application. topically 4 (four) times daily as needed (pain.).   Liniments (PAIN RELIEF EX) Apply 1 application. topically 4 (four) times daily as needed (pain.). Triderma Pain Relief Cream   loratadine (CLARITIN) 10 MG tablet Take 10 mg by mouth daily as needed for allergies.   Menthol, Topical Analgesic, (ICE BLUE EX) Apply 1 application. topically 4 (four) times daily as needed (pain.).   Menthol-Methyl Salicylate (MUSCLE RUB) 10-15 % CREA Apply 1 application. topically as needed for muscle pain.   Multiple Minerals-Vitamins (CAL MAG ZINC +D3 PO) Take 1 tablet by mouth every evening.   Multiple Vitamin (MULTIVITAMIN WITH MINERALS) TABS tablet Take 1 tablet by mouth every evening. Women's Multivitamin 50+   triamterene-hydrochlorothiazide (MAXZIDE-25) 37.5-25 MG tablet TAKE 1 TABLET EVERY DAY   TURMERIC PO Take 450 mg by mouth every evening.   [DISCONTINUED] promethazine-dextromethorphan (PROMETHAZINE-DM) 6.25-15 MG/5ML syrup Half to one teaspoone one tol two times daily, as needed, for excessive cough   No facility-administered encounter medications on file as of 09/07/2022.    Allergies (verified) Valsartan and Ace inhibitors   History: Past Medical History:  Diagnosis Date   Allergy    Arthritis    Cataract    Diverticulosis 05/2009   GERD (  gastroesophageal reflux disease)    Glaucoma    Hyperlipidemia    Hypertension    Past Surgical History:  Procedure Laterality Date   ABDOMINAL HYSTERECTOMY  04/26/1988   for fibroids ovaries remain    CHOLECYSTECTOMY  04/26/1978   COLONOSCOPY  05/27/2009   rare diverticula, small internal hemorrhoids. Next colonoscopy in 5-10 yrs.   COLONOSCOPY N/A 10/23/2018   Procedure: COLONOSCOPY;  Surgeon: West Bali, MD;  Location: [facility];  Service: Endoscopy;  Laterality: N/A;  2:00pm   JOINT  REPLACEMENT N/A    Phreesia 06/02/2020   left nipple inversion bx benign  04/26/1998   POLYPECTOMY  10/23/2018   Procedure: POLYPECTOMY;  Surgeon: West Bali, MD;  Location: [facility];  Service: Endoscopy;;   TOTAL HIP ARTHROPLASTY Right 05/16/2020   Procedure: TOTAL HIP ARTHROPLASTY;  Surgeon: Frederico Hamman, MD;  Location: [facility];  Service: Orthopedics;  Laterality: Right;   Family History  Problem Relation Age of Onset   Kidney failure Father    Arthritis Father    Cancer Father    Heart disease Sister    Hypertension Sister    Arthritis Sister    Breast cancer Sister    Diabetes Sister    Arthritis Sister    Lung cancer Brother        was a smoker   Arthritis Brother    Diabetes Brother    Heart disease Brother    Hypertension Brother    Arthritis Mother    Heart disease Mother    Hypertension Mother    Prostate cancer Brother    Colon cancer Neg Hx    Social History   Socioeconomic History   Marital status: Single    Spouse name: Not on file   Number of children: 0   Years of education: Not on file   Highest education level: Not on file  Occupational History   Occupation: retired    Associate Professor: RETIRED  Tobacco Use   Smoking status: Never   Smokeless tobacco: Never  Vaping Use   Vaping Use: Never used  Substance and Sexual Activity   Alcohol use: Not Currently    Comment: rarely    Drug use: No   Sexual activity: Not Currently    Birth control/protection: Surgical    Comment: not asked  Other Topics Concern   Not on file  Social History Narrative   Not on file   Social Determinants of Health   Financial Resource Strain: Low Risk  (09/07/2022)   Overall Financial Resource Strain (CARDIA)    Difficulty of Paying Living Expenses: Not hard at all  Food Insecurity: No Food Insecurity (09/07/2022)   Hunger Vital Sign    Worried About Running Out of Food in the Last Year: Never true    Ran Out of Food in the Last Year: Never true  Transportation  Needs: No Transportation Needs (09/07/2022)   PRAPARE - Administrator, Civil Service (Medical): No    Lack of Transportation (Non-Medical): No  Physical Activity: Insufficiently Active (09/07/2022)   Exercise Vital Sign    Days of Exercise per Week: 1 day    Minutes of Exercise per Session: 90 min  Stress: No Stress Concern Present (09/07/2022)   [employer] of Occupational Health - Occupational Stress Questionnaire    Feeling of Stress : Not at all  Social Connections: Unknown (09/07/2022)   Social Connection and Isolation Panel [NHANES]    Frequency of Communication with Friends  and Family: More than three times a week    Frequency of Social Gatherings with Friends and Family: More than three times a week    Attends Religious Services: Not on Marketing executive or Organizations: Yes    Attends Engineer, structural: More than 4 times per year    Marital Status: Never married    Tobacco Counseling Counseling given: Not Answered   Clinical Intake:  Pre-visit preparation completed: Yes  Pain : No/denies pain     Diabetes: No  How often do you need to have someone help you when you read instructions, pamphlets, or other written materials from your doctor or pharmacy?: 1 - Never What is the last grade level you completed in school?: College    Activities of Daily Living    09/07/2022    1:50 PM 09/07/2022   12:22 AM  In your present state of health, do you have any difficulty performing the following activities:  Hearing? 0 0  Vision? 0 0  Difficulty concentrating or making decisions? 0 0  Walking or climbing stairs? 0 0  Dressing or bathing? 0 0  Doing errands, shopping? 0 0  Preparing Food and eating ?  N  Using the Toilet?  N  In the past six months, have you accidently leaked urine?  N  Do you have problems with loss of bowel control?  N  Managing your Medications?  N  Managing your Finances?  N  Housekeeping or managing your  Housekeeping?  N    Patient Care Team: Kerri Perches, MD as PCP - General Diona Browner Illene Bolus, MD as PCP - Cardiology (Cardiology)  Indicate any recent Medical Services you may have received from other than [facility] providers in the past year (date may be approximate).     Assessment:   This is a routine wellness examination for [place].  Hearing/Vision screen No results found.  Dietary issues and exercise activities discussed:     Goals Addressed   None    Depression Screen    09/07/2022    1:48 PM 03/10/2022    1:05 PM 08/31/2021    1:08 PM 08/31/2021    1:04 PM 08/05/2021    1:08 PM 07/14/2021    1:07 PM 07/14/2021   12:46 PM  PHQ 2/9 Scores  PHQ - 2 Score 0 0 0 0 0 0 0    Fall Risk    09/07/2022    1:48 PM 09/07/2022   12:22 AM 03/10/2022    1:05 PM 08/31/2021    1:08 PM 08/31/2021   12:16 AM  Fall Risk   Falls in the past year? 0 0 1 0 0  Number falls in past yr:   0 0 0  Injury with Fall?  0 1 0 0  Risk for fall due to :   History of fall(s) No Fall Risks   Follow up   Falls evaluation completed Falls evaluation completed     FALL RISK PREVENTION PERTAINING TO THE HOME:  Any stairs in or around the home? No  If so, are there any without handrails? No  Home free of loose throw rugs in walkways, pet beds, electrical cords, etc? Yes  Adequate lighting in your home to reduce risk of falls? Yes   ASSISTIVE DEVICES UTILIZED TO PREVENT FALLS:  Life alert? No  Use of a cane, walker or w/c? No  Grab bars in the bathroom? Yes  Shower chair or bench  in shower? Yes  Elevated toilet seat or a handicapped toilet? Yes    Cognitive Function:    08/31/2021    1:10 PM  MMSE - Mini Mental State Exam  Not completed: Unable to complete        09/07/2022    1:50 PM 08/31/2021    1:10 PM 08/26/2020   11:25 AM 08/20/2019   11:33 AM 08/17/2018    9:05 AM  6CIT Screen  What Year? 0 points 0 points 0 points 0 points 0 points  What month? 0 points 0 points 0 points 0 points 0  points  What time? 0 points 0 points 0 points 0 points 0 points  Count back from 20 0 points 0 points 0 points 0 points 0 points  Months in reverse 0 points 0 points 0 points 0 points 0 points  Repeat phrase 0 points 0 points 0 points 0 points 0 points  Total Score 0 points 0 points 0 points 0 points 0 points    Immunizations Immunization History  Administered Date(s) Administered   Fluad Quad(high Dose 65+) 01/15/2019, 02/05/2020, 02/03/2021, 04/27/2022   Influenza Split 02/04/2011   Influenza Whole 04/03/2009, 01/01/2010   Influenza, High Dose Seasonal PF 03/28/2018   Influenza,inj,Quad PF,6+ Mos 03/20/2013, 01/24/2014, 02/24/2015, 02/03/2016, 03/23/2017   Moderna SARS-COV2 Booster Vaccination 04/10/2020, 08/27/2020   Moderna Sars-Covid-2 Vaccination 06/13/2019, 07/11/2019   Pneumococcal Conjugate-13 07/03/2014   Pneumococcal Polysaccharide-23 03/20/2013   Td 04/03/2009   Zoster Recombinat (Shingrix) 08/09/2021, 03/08/2022   Zoster, Live 02/04/2011    TDAP status: Due, Education has been provided regarding the importance of this vaccine. Advised may receive this vaccine at local pharmacy or Health Dept. Aware to provide a copy of the vaccination record if obtained from local pharmacy or Health Dept. Verbalized acceptance and understanding.  Flu Vaccine status: Up to date  Pneumococcal vaccine status: Up to date  Covid-19 vaccine status: Completed vaccines Will bring updated vaccines to visit  Qualifies for Shingles Vaccine? Yes   Zostavax completed No   Shingrix Completed?: Yes  Screening Tests Health Maintenance  Topic Date Due   DTaP/Tdap/Td (2 - Tdap) 04/04/2019   COVID-19 Vaccine (3 - Moderna risk series) 09/24/2020   Medicare Annual Wellness (AWV)  09/01/2022   INFLUENZA VACCINE  11/25/2022   COLONOSCOPY (Pts 45-26yrs Insurance coverage will need to be confirmed)  10/23/2023   MAMMOGRAM  08/15/2024   Pneumonia Vaccine 11+ Years old  Completed   DEXA SCAN   Completed   Hepatitis C Screening  Completed   Zoster Vaccines- Shingrix  Completed   HPV VACCINES  Aged Out    Health Maintenance  Health Maintenance Due  Topic Date Due   DTaP/Tdap/Td (2 - Tdap) 04/04/2019   COVID-19 Vaccine (3 - Moderna risk series) 09/24/2020   Medicare Annual Wellness (AWV)  09/01/2022    Colorectal cancer screening: Type of screening: Colonoscopy. Completed 10/23/2018. Repeat every 5 years  Mammogram status: Completed 08/16/2022. Repeat every year  Bone Density status: Completed 02/11/2021. Results reflect: Bone density results: NORMAL. Repeat every 2 years.  Lung Cancer Screening: (Low Dose CT Chest recommended if Age 8-80 years, 30 pack-year currently smoking OR have quit w/in 15years.) does not qualify.    Additional Screening:  Hepatitis C Screening: does not qualify; Completed 02/24/2015  Vision Screening: Recommended annual ophthalmology exams for early detection of glaucoma and other disorders of the eye. Is the patient up to date with their annual eye exam?  Yes  Who is the  provider or what is the name of the office in which the patient attends annual eye exams? [facility] If pt is not established with a provider, would they like to be referred to a provider to establish care? No .   Dental Screening: Recommended annual dental exams for proper oral hygiene  Community Resource Referral / Chronic Care Management: CRR required this visit?  No   CCM required this visit?  No      Plan:     I have personally reviewed and noted the following in the patient's chart:   Medical and social history Use of alcohol, tobacco or illicit drugs  Current medications and supplements including opioid prescriptions. Patient is not currently taking opioid prescriptions. Functional ability and status Nutritional status Physical activity Advanced directives List of other physicians Hospitalizations, surgeries, and ER visits in previous 12  months Vitals Screenings to include cognitive, depression, and falls Referrals and appointments  In addition, I have reviewed and discussed with patient certain preventive protocols, quality metrics, and best practice recommendations. A written personalized care plan for preventive services as well as general preventive health recommendations were provided to patient.     Milus Banister, MD   09/07/2022

## 2022-09-10 ENCOUNTER — Ambulatory Visit (INDEPENDENT_AMBULATORY_CARE_PROVIDER_SITE_OTHER): Payer: Medicare Other | Admitting: Family Medicine

## 2022-09-10 ENCOUNTER — Encounter: Payer: Self-pay | Admitting: Family Medicine

## 2022-09-10 VITALS — BP 118/77 | HR 71 | Ht 65.0 in | Wt 160.0 lb

## 2022-09-10 DIAGNOSIS — E559 Vitamin D deficiency, unspecified: Secondary | ICD-10-CM | POA: Diagnosis not present

## 2022-09-10 DIAGNOSIS — M546 Pain in thoracic spine: Secondary | ICD-10-CM | POA: Diagnosis not present

## 2022-09-10 DIAGNOSIS — R7303 Prediabetes: Secondary | ICD-10-CM

## 2022-09-10 DIAGNOSIS — G8929 Other chronic pain: Secondary | ICD-10-CM | POA: Diagnosis not present

## 2022-09-10 DIAGNOSIS — M542 Cervicalgia: Secondary | ICD-10-CM

## 2022-09-10 DIAGNOSIS — I10 Essential (primary) hypertension: Secondary | ICD-10-CM | POA: Diagnosis not present

## 2022-09-10 DIAGNOSIS — N62 Hypertrophy of breast: Secondary | ICD-10-CM

## 2022-09-10 DIAGNOSIS — E785 Hyperlipidemia, unspecified: Secondary | ICD-10-CM

## 2022-09-10 DIAGNOSIS — L918 Other hypertrophic disorders of the skin: Secondary | ICD-10-CM

## 2022-09-10 NOTE — Patient Instructions (Signed)
F/U in 6 months, call if you need me sooner  CBC, fasting lipid, cmp and EGFr, HBA1C , TSH and Vit D to be drawn approx 1 wek before next appt  You will be referred to  PT twice weekly for 6 weeks, and alsoto Plastic surgeon as we discussed  It is important that you exercise regularly at least 30 minutes 5 times a week. If you develop chest pain, have severe difficulty breathing, or feel very tired, stop exercising immediately and seek medical attention   Think about what you will eat, plan ahead. Choose " clean, green, fresh or frozen" over canned, processed or packaged foods which are more sugary, salty and fatty. 70 to 75% of food eaten should be vegetables and fruit. Three meals at set times with snacks allowed between meals, but they must be fruit or vegetables. Aim to eat over a 12 hour period , example 7 am to 7 pm, and STOP after  your last meal of the day. Drink water,generally about 64 ounces per day, no other drink is as healthy. Fruit juice is best enjoyed in a healthy way, by EATING the fruit. Continue to work on low fat diet  I will also refer you to nutrition because of cholesterol and sugar  You Need TdAP, RSV and Covid vaccines , all are through your pharmacy  Thanks for choosing [facility], we consider it a privelige to serve you.

## 2022-09-12 ENCOUNTER — Encounter: Payer: Self-pay | Admitting: Family Medicine

## 2022-09-12 DIAGNOSIS — M542 Cervicalgia: Secondary | ICD-10-CM | POA: Insufficient documentation

## 2022-09-12 DIAGNOSIS — M546 Pain in thoracic spine: Secondary | ICD-10-CM | POA: Insufficient documentation

## 2022-09-12 DIAGNOSIS — N62 Hypertrophy of breast: Secondary | ICD-10-CM

## 2022-09-12 HISTORY — DX: Hypertrophy of breast: N62

## 2022-09-12 NOTE — Assessment & Plan Note (Signed)
Hyperlipidemia:Low fat diet discussed and encouraged.   Lipid Panel  Lab Results  Component Value Date   CHOL 248 (H) 09/03/2022   HDL 97 09/03/2022   LDLCALC 125 (H) 09/03/2022   TRIG 151 (H) 09/03/2022   CHOLHDL 2.6 09/03/2022     Needs to lower fat intake

## 2022-09-12 NOTE — Assessment & Plan Note (Signed)
Patient educated about the importance of limiting  Carbohydrate intake , the need to commit to daily physical activity for a minimum of 30 minutes , and to commit weight loss. The fact that changes in all these areas will reduce or eliminate all together the development of diabetes is stressed.      Latest Ref Rng & Units 09/03/2022   10:56 AM 03/08/2022   10:14 AM 08/03/2021    1:27 PM 02/02/2021   10:49 AM 05/07/2020   11:30 AM  Diabetic Labs  HbA1c 4.8 - 5.6 % 5.8  5.9  5.8  6.1    Chol 100 - 199 mg/dL 098  119  147  829    HDL >39 mg/dL 97  90  562  95    Calc LDL 0 - 99 mg/dL 130  865  784  696    Triglycerides 0 - 149 mg/dL 295  70  75  89    Creatinine 0.57 - 1.00 mg/dL 2.84  1.32  4.40  1.02  0.86       09/10/2022    1:05 PM 07/03/2022    1:49 PM 07/03/2022    1:46 PM 05/25/2022    2:47 PM 03/10/2022    1:04 PM 08/31/2021    1:00 PM 08/18/2021    3:37 PM  BP/Weight  Systolic BP 118 134 155 115 113 114 112  Diastolic BP 77 84 94 78 73 77 74  Wt. (Lbs) 160.04   163.8 164.04 165.08 165  BMI 26.63 kg/m2   26.84 kg/m2 27.3 kg/m2 27.47 kg/m2 27.04 kg/m2       No data to display          Improved Updated lab needed at/ before next visit.

## 2022-09-12 NOTE — Progress Notes (Signed)
Adrienne [name]     MRN: [id number]      DOB: [birth year]  Chief Complaint  Patient presents with   Follow-up    Follow up was having shoulder pain    HPI Ms. Foto is here for follow up and re-evaluation of chronic medical conditions, medication management and review of any available recent lab and radiology data.  Preventive health is updated, specifically  Cancer screening and Immunization.    The PT denies any adverse reactions to current medications since the last visit.  Plans to work more diligently on food choice in particular to improve lipids C/O excessively large breasts, left worse than right with shoulder and upper back pain, wants to be evaluated for breast reduction which is reasonable, cup size is H a C/o significant left neck and shoulder pain 1 week ago which was releaved wih taking tramadol and tylenol 1 week   ROS Denies recent fever or chills. Denies sinus pressure, nasal congestion, ear pain or sore throat. Denies chest congestion, productive cough or wheezing. Denies chest pains, palpitations and leg swelling Denies abdominal pain, nausea, vomiting,diarrhea or constipation.   Denies dysuria, frequency, hesitancy or incontinence.Increased left knee pain intermittently, no buckling , topical and tylenol are successful in alleviating pain Denies headaches, seizures, numbness, or tingling. Denies depression, anxiety or insomnia. Denies skin break down or rash.   PE  BP 118/77 (BP Location: Right Arm, Patient Position: Sitting, Cuff Size: Normal)   Pulse 71   Ht 5\' 5"  (1.651 m)   Wt 160 lb 0.6 oz (72.6 kg)   SpO2 96%   BMI 26.63 kg/m   Patient alert and oriented and in no cardiopulmonary distress.  HEENT: No facial asymmetry, EOMI,     Neck decreased ROM.  Chest: Clear to auscultation bilaterally.  CVS: S1, S2 no murmurs, no S3.Regular rate.  ABD: Soft non tender.   Ext: No edema  MS: Adequate  though reduced ROMROM spine, shoulders, hips and  knees.  Skin: Intact, no ulcerations or rash noted.  Psych: Good eye contact, normal affect. Memory intact not anxious or depressed appearing.  CNS: CN 2-12 intact, power,  normal throughout.no focal deficits noted.   Assessment & Plan  Essential hypertension Controlled, no change in medication DASH diet and commitment to daily physical activity for a minimum of 30 minutes discussed and encouraged, as a part of hypertension management. The importance of attaining a healthy weight is also discussed.     09/10/2022    1:05 PM 07/03/2022    1:49 PM 07/03/2022    1:46 PM 05/25/2022    2:47 PM 03/10/2022    1:04 PM 08/31/2021    1:00 PM 08/18/2021    3:37 PM  BP/Weight  Systolic BP 118 134 155 115 113 114 112  Diastolic BP 77 84 94 78 73 77 74  Wt. (Lbs) 160.04   163.8 164.04 165.08 165  BMI 26.63 kg/m2   26.84 kg/m2 27.3 kg/m2 27.47 kg/m2 27.04 kg/m2       Hyperlipidemia LDL goal <100 Hyperlipidemia:Low fat diet discussed and encouraged.   Lipid Panel  Lab Results  Component Value Date   CHOL 248 (H) 09/03/2022   HDL 97 09/03/2022   LDLCALC 125 (H) 09/03/2022   TRIG 151 (H) 09/03/2022   CHOLHDL 2.6 09/03/2022     Needs to lower fat intake  Prediabetes Patient educated about the importance of limiting  Carbohydrate intake , the need to commit to daily physical activity for a minimum  of 30 minutes , and to commit weight loss. The fact that changes in all these areas will reduce or eliminate all together the development of diabetes is stressed.      Latest Ref Rng & Units 09/03/2022   10:56 AM 03/08/2022   10:14 AM 08/03/2021    1:27 PM 02/02/2021   10:49 AM 05/07/2020   11:30 AM  Diabetic Labs  HbA1c 4.8 - 5.6 % 5.8  5.9  5.8  6.1    Chol 100 - 199 mg/dL 161  096  045  409    HDL >39 mg/dL 97  90  811  95    Calc LDL 0 - 99 mg/dL 914  782  956  213    Triglycerides 0 - 149 mg/dL 086  70  75  89    Creatinine 0.57 - 1.00 mg/dL 5.78  4.69  6.29  5.28  0.86        09/10/2022    1:05 PM 07/03/2022    1:49 PM 07/03/2022    1:46 PM 05/25/2022    2:47 PM 03/10/2022    1:04 PM 08/31/2021    1:00 PM 08/18/2021    3:37 PM  BP/Weight  Systolic BP 118 134 155 115 113 114 112  Diastolic BP 77 84 94 78 73 77 74  Wt. (Lbs) 160.04   163.8 164.04 165.08 165  BMI 26.63 kg/m2   26.84 kg/m2 27.3 kg/m2 27.47 kg/m2 27.04 kg/m2       No data to display          Improved Updated lab needed at/ before next visit.   Large breasts Reports increased back and shoulder pain as well as gradually increasing breast size with age, refer plastics for eval and management  Neck pain on left side 2 week h/o significant pain no aggravating incident noted refer PT for 6 weeks has established arthritis in neck  Thoracic back pain Increased pain likely due to enlarging breasts, referred to Plastics, will also refer to PT

## 2022-09-12 NOTE — Assessment & Plan Note (Addendum)
Increased pain likely due to enlarging breasts, referred to Plastics, will also refer to PT

## 2022-09-12 NOTE — Assessment & Plan Note (Signed)
Controlled, no change in medication DASH diet and commitment to daily physical activity for a minimum of 30 minutes discussed and encouraged, as a part of hypertension management. The importance of attaining a healthy weight is also discussed.     09/10/2022    1:05 PM 07/03/2022    1:49 PM 07/03/2022    1:46 PM 05/25/2022    2:47 PM 03/10/2022    1:04 PM 08/31/2021    1:00 PM 08/18/2021    3:37 PM  BP/Weight  Systolic BP 118 134 155 115 113 114 112  Diastolic BP 77 84 94 78 73 77 74  Wt. (Lbs) 160.04   163.8 164.04 165.08 165  BMI 26.63 kg/m2   26.84 kg/m2 27.3 kg/m2 27.47 kg/m2 27.04 kg/m2

## 2022-09-12 NOTE — Assessment & Plan Note (Signed)
Reports increased back and shoulder pain as well as gradually increasing breast size with age, refer plastics for eval and management

## 2022-09-12 NOTE — Assessment & Plan Note (Signed)
2 week h/o significant pain no aggravating incident noted refer PT for 6 weeks has established arthritis in neck

## 2022-09-16 ENCOUNTER — Encounter: Payer: Self-pay | Admitting: Family Medicine

## 2022-09-23 ENCOUNTER — Encounter: Payer: Self-pay | Admitting: Internal Medicine

## 2022-10-11 DIAGNOSIS — H2513 Age-related nuclear cataract, bilateral: Secondary | ICD-10-CM | POA: Diagnosis not present

## 2022-10-11 DIAGNOSIS — H401131 Primary open-angle glaucoma, bilateral, mild stage: Secondary | ICD-10-CM | POA: Diagnosis not present

## 2022-10-25 ENCOUNTER — Ambulatory Visit (HOSPITAL_COMMUNITY): Payer: Medicare Other | Attending: Family Medicine | Admitting: Physical Therapy

## 2022-10-25 ENCOUNTER — Other Ambulatory Visit: Payer: Self-pay

## 2022-10-25 DIAGNOSIS — G8929 Other chronic pain: Secondary | ICD-10-CM | POA: Insufficient documentation

## 2022-10-25 DIAGNOSIS — R262 Difficulty in walking, not elsewhere classified: Secondary | ICD-10-CM | POA: Diagnosis not present

## 2022-10-25 DIAGNOSIS — M542 Cervicalgia: Secondary | ICD-10-CM | POA: Diagnosis not present

## 2022-10-25 DIAGNOSIS — M546 Pain in thoracic spine: Secondary | ICD-10-CM | POA: Diagnosis not present

## 2022-10-25 DIAGNOSIS — M6281 Muscle weakness (generalized): Secondary | ICD-10-CM | POA: Diagnosis not present

## 2022-10-25 NOTE — Therapy (Signed)
OUTPATIENT PHYSICAL THERAPY CERVICAL EVALUATION   Patient Name: Adrienne [name] MRN: [id number] DOB:Dec 30, 1945, 77 y.o., female Today's Date: 10/25/2022  END OF SESSION:  PT End of Session - 10/25/22 1510     Visit Number 1    Number of Visits 8    Date for PT Re-Evaluation 11/24/22    Authorization Type Medicare    Progress Note Due on Visit 8    PT Start Time 1430    PT Stop Time 1510    PT Time Calculation (min) 40 min    Activity Tolerance Patient tolerated treatment well    Behavior During Therapy [facility] for tasks assessed/performed             Past Medical History:  Diagnosis Date   Allergy    Arthritis    Cataract    Diverticulosis 05/2009   GERD (gastroesophageal reflux disease)    Glaucoma    Hyperlipidemia    Hypertension    Past Surgical History:  Procedure Laterality Date   ABDOMINAL HYSTERECTOMY  04/26/1988   for fibroids ovaries remain    CHOLECYSTECTOMY  04/26/1978   COLONOSCOPY  05/27/2009   rare diverticula, small internal hemorrhoids. Next colonoscopy in 5-10 yrs.   COLONOSCOPY N/A 10/23/2018   Procedure: COLONOSCOPY;  Surgeon: Adrienne Bali, MD;  [facility];  Service: Endoscopy;  Laterality: N/A;  2:00pm   JOINT REPLACEMENT N/A    Phreesia 06/02/2020   left nipple inversion bx benign  04/26/1998   POLYPECTOMY  10/23/2018   Procedure: POLYPECTOMY;  Surgeon: Adrienne Bali, MD;  [facility];  Service: Endoscopy;;   TOTAL HIP ARTHROPLASTY Right 05/16/2020   Procedure: TOTAL HIP ARTHROPLASTY;  Surgeon: Adrienne Hamman, MD;  Location: [facility];  Service: Orthopedics;  Laterality: Right;   Patient Active Problem List   Diagnosis Date Noted   Large breasts 09/12/2022   Neck pain 09/12/2022   Thoracic back pain 09/12/2022   COVID-19 03/22/2022   Fatigue due to excessive exertion 03/12/2022   Skin tags, multiple acquired 08/05/2021   Epidermoid cyst of skin of scalp 08/05/2021   Headache 08/05/2021   Osteopenia 02/03/2021    Acquired skin tag 10/19/2020   Leg swelling 03/31/2020   Venous insufficiency of both lower extremities 03/31/2020   Vitamin D deficiency 03/06/2020   Prediabetes 03/06/2020   Knee pain, right 04/06/2019   Left ankle sprain 04/06/2019   Personal history of colonic polyps 07/12/2018   Ingrown toenail of both feet 03/23/2017   Hip pain, chronic, right 02/03/2016   Neck pain on left side 02/24/2015   GERD (gastroesophageal reflux disease) 12/26/2014   Diverticulosis of colon without hemorrhage 07/05/2014   FH: pancreatic cancer 01/26/2014   FH: glaucoma 03/24/2013   Overweight (BMI 25.0-29.9) 03/13/2012   Hyperlipidemia LDL goal <100 04/03/2009   Essential hypertension 04/03/2009    PCP: Adrienne [name]  REFERRING PROVIDER: Kerri Perches, MD  REFERRING DIAG:  Diagnosis  M54.2 (ICD-10-CM) - Neck pain on left side  M54.6,G89.29 (ICD-10-CM) - Chronic bilateral thoracic back pain    THERAPY DIAG:  Cervicalgia  Rationale for Evaluation and Treatment: Rehabilitation  ONSET DATE: chronic noted 2016 with acute exacerbation in the past 6 months.  SUBJECTIVE STATEMENT: Pt states that the excruciating pain comes and goes and she is not really sure what causes it.  It has happened twice.  She states that she uses ice which helps and she is tender to the touch.  The pain is on her Left side and occasionally will go down into her shoulder  Hand dominance: Right  PERTINENT HISTORY:  OA with cervical,back, hip and knee pain   PAIN:  Are you having pain? Yes: NPRS scale: 0/10; worst pain this week has been a 0/10 Pain location: Lt cervical Pain description: tender to touch  Aggravating factors: not sure Relieving factors: ice  PRECAUTIONS: None  WEIGHT BEARING RESTRICTIONS:  No  FALLS:  Has patient fallen in last 6 months? No  PLOF: Independent  PATIENT GOALS: to not be tender to the touch, self care   NEXT MD VISIT: November 2024  OBJECTIVE:   DIAGNOSTIC FINDINGS:  Nothing recent  PATIENT SURVEYS:  FOTO 47  COGNITION: Overall cognitive status: Within functional limits for tasks assessed  SPOSTURE: No Significant postural limitations  PALPATION: Tender to touch with B mm spasm in upper trap with Lt greater than RT   CERVICAL ROM:   Active ROM A/PROM (deg) eval  Flexion 50  Extension 50  Right lateral flexion   Left lateral flexion   Right rotation 53  Left rotation 45   (Blank rows = not tested)  UPPER EXTREMITY ROM: WNL   CERVICAL SPECIAL TESTS:  AROM:  Extension : 50, reps no change                Flexion : 50, reps no change                Rt rotation: 53               Lt rotation: 45 Isometric mm testing: RT SB: 4- Lt SB :4 Extension:  5    TODAY'S TREATMENT:                                                                                                                              DATE: eval 10/25/22 Sitting: Trapezius stretch 3 x 30" SB isometric 5 x 5"  B    PATIENT EDUCATION:  Education details: HEP Person educated: Patient Education method: Explanation, Verbal cues, and Handouts Education comprehension: returned demonstration  HOME EXERCISE PROGRAM: Access Code: [email or web address] URL: [email or web address] Date: 10/25/2022 Prepared by: Adrienne [name]  Exercises - Seated Upper Trapezius Stretch  - 3 x daily - 7 x weekly - 1 sets - 3 reps - 30" hold - Seated Isometric Cervical Sidebending  - 3 x daily - 7 x weekly - 1 sets - 5 reps - 5" hold  ASSESSMENT:  CLINICAL IMPRESSION: Patient is a 77 y.o. female who was seen today for physical therapy evaluation and treatment for cervical and upper thoracic pain.  Evaluation demonstrates decreased ROM, decreased strength, increased pain and increased mm  spasm.  Adrienne [name] will benefit from skilled PT to address these issues and maximize her function and decrease her pain.   OBJECTIVE IMPAIRMENTS: decreased ROM, decreased strength, increased muscle spasms, and pain.    PERSONAL FACTORS: Past/current experiences, Time since onset of injury/illness/exacerbation, and 1 comorbidity: OA  are also affecting patient's functional outcome.   REHAB POTENTIAL: Good  CLINICAL DECISION MAKING: Stable/uncomplicated  EVALUATION COMPLEXITY: Low   GOALS: Goals reviewed with patient? No  SHORT TERM GOALS: Target date: 11/08/2022  PT to be I in HEP in order  to improve her cervical rotation to 60 degrees for improved ROM for driving/ talking to people sitting beside her. Baseline:  Goal status: INITIAL    LONG TERM GOALS: Target date: 7/30/243  PT to be I in HEP in order  to improve cervical rotation to 65 degrees B  Baseline:  Goal status: INITIAL  2.  Pt to have not had any instance of excruciating pain in her neck.  Baseline:  Goal status: INITIAL  3.  PT SB mm strength to be at least 4+/5 to demonstrate improved cervical stability. Baseline:  Goal status: INITIAL  4.  Pt to state that she is no longer tender to the touch  Baseline:  Goal status: INITIAL     PLAN:  PT FREQUENCY: 2x/week  PT DURATION: 4 weeks  PLANNED INTERVENTIONS: Therapeutic exercises, Patient/Family education, Self Care, Joint mobilization, and Manual therapy  PLAN FOR NEXT SESSION: begin cervical and scapular retraction, cervical rotation, cervical and thoracic excursions and manual.     Adrienne [name], PT CLT [phone]  10/25/2022, 3:06 PM

## 2022-10-26 ENCOUNTER — Ambulatory Visit (HOSPITAL_COMMUNITY): Payer: Medicare Other | Admitting: Physical Therapy

## 2022-10-26 DIAGNOSIS — R262 Difficulty in walking, not elsewhere classified: Secondary | ICD-10-CM

## 2022-10-26 DIAGNOSIS — M542 Cervicalgia: Secondary | ICD-10-CM | POA: Diagnosis not present

## 2022-10-26 DIAGNOSIS — G8929 Other chronic pain: Secondary | ICD-10-CM | POA: Diagnosis not present

## 2022-10-26 DIAGNOSIS — M546 Pain in thoracic spine: Secondary | ICD-10-CM | POA: Diagnosis not present

## 2022-10-26 DIAGNOSIS — M6281 Muscle weakness (generalized): Secondary | ICD-10-CM

## 2022-10-26 NOTE — Therapy (Signed)
OUTPATIENT PHYSICAL THERAPY TREATMENT  Patient Name: Adrienne [name] MRN: [id number] DOB:1946/04/12, 77 y.o., female Today's Date: 10/26/2022   END OF SESSION:   PT End of Session - 10/26/22 1036     Visit Number 2    Number of Visits 8    Date for PT Re-Evaluation 11/24/22    Authorization Type Medicare    Progress Note Due on Visit 8    PT Start Time 1035    PT Stop Time 1115    PT Time Calculation (min) 40 min    Activity Tolerance Patient tolerated treatment well    Behavior During Therapy [facility] for tasks assessed/performed                 Past Medical History:  Diagnosis Date   Allergy    Arthritis    Cataract    Diverticulosis 05/2009   GERD (gastroesophageal reflux disease)    Glaucoma    Hyperlipidemia    Hypertension    Past Surgical History:  Procedure Laterality Date   ABDOMINAL HYSTERECTOMY  04/26/1988   for fibroids ovaries remain    CHOLECYSTECTOMY  04/26/1978   COLONOSCOPY  05/27/2009   rare diverticula, small internal hemorrhoids. Next colonoscopy in 5-10 yrs.   COLONOSCOPY N/A 10/23/2018   Procedure: COLONOSCOPY;  Surgeon: West Bali, MD;  [facility];  Service: Endoscopy;  Laterality: N/A;  2:00pm   JOINT REPLACEMENT N/A    Phreesia 06/02/2020   left nipple inversion bx benign  04/26/1998   POLYPECTOMY  10/23/2018   Procedure: POLYPECTOMY;  Surgeon: West Bali, MD;  [facility];  Service: Endoscopy;;   TOTAL HIP ARTHROPLASTY Right 05/16/2020   Procedure: TOTAL HIP ARTHROPLASTY;  Surgeon: Frederico Hamman, MD;  Location: [facility];  Service: Orthopedics;  Laterality: Right;   Patient Active Problem List   Diagnosis Date Noted   Large breasts 09/12/2022   Neck pain 09/12/2022   Thoracic back pain 09/12/2022   COVID-19 03/22/2022   Fatigue due to excessive exertion 03/12/2022   Skin tags, multiple acquired 08/05/2021   Epidermoid cyst of skin of scalp 08/05/2021   Headache 08/05/2021   Osteopenia 02/03/2021    Acquired skin tag 10/19/2020   Leg swelling 03/31/2020   Venous insufficiency of both lower extremities 03/31/2020   Vitamin D deficiency 03/06/2020   Prediabetes 03/06/2020   Knee pain, right 04/06/2019   Left ankle sprain 04/06/2019   Personal history of colonic polyps 07/12/2018   Ingrown toenail of both feet 03/23/2017   Hip pain, chronic, right 02/03/2016   Neck pain on left side 02/24/2015   GERD (gastroesophageal reflux disease) 12/26/2014   Diverticulosis of colon without hemorrhage 07/05/2014   FH: pancreatic cancer 01/26/2014   FH: glaucoma 03/24/2013   Overweight (BMI 25.0-29.9) 03/13/2012   Hyperlipidemia LDL goal <100 04/03/2009   Essential hypertension 04/03/2009    PCP: Syliva Overman  REFERRING PROVIDER: Kerri Perches, MD  REFERRING DIAG:  Diagnosis  M54.2 (ICD-10-CM) - Neck pain on left side  M54.6,G89.29 (ICD-10-CM) - Chronic bilateral thoracic back pain    THERAPY DIAG:  Cervicalgia  Difficulty in walking, not elsewhere classified  Muscle weakness (generalized)  Rationale for Evaluation and Treatment: Rehabilitation  ONSET DATE: chronic noted 2016 with acute exacerbation in the past 6 months.  SUBJECTIVE STATEMENT: Pt states she is a little sore from the new exercises given yesterday.  States she is having piercing pain at times in her neck and into her Lt shoulder. Reports she goes from no pain to bolts of pain.  Evaluation: Pt states that the excruciating pain comes and goes and she is not really sure what causes it.  It has happened twice.  She states that she uses ice which helps and she is tender to the touch.  The pain is on her Left side and occasionally will go down into her shoulder  Hand dominance: Right  PERTINENT HISTORY:  OA  with cervical,back, hip and knee pain   PAIN:  Are you having pain? Yes: NPRS scale: 0/10; worst pain this week has been a 0/10 Pain location: Lt cervical Pain description: tender to touch  Aggravating factors: not sure Relieving factors: ice  PRECAUTIONS: None  WEIGHT BEARING RESTRICTIONS: No  FALLS:  Has patient fallen in last 6 months? No  PLOF: Independent  PATIENT GOALS: to not be tender to the touch, self care   NEXT MD VISIT: November 2024  OBJECTIVE:   DIAGNOSTIC FINDINGS:  Nothing recent  PATIENT SURVEYS:  FOTO 47  COGNITION: Overall cognitive status: Within functional limits for tasks assessed  SPOSTURE: No Significant postural limitations  PALPATION: Tender to touch with B mm spasm in upper trap with Lt greater than RT   CERVICAL ROM:   Active ROM A/PROM (deg) eval  Flexion 50  Extension 50  Right lateral flexion   Left lateral flexion   Right rotation 53  Left rotation 45   (Blank rows = not tested)  UPPER EXTREMITY ROM: WNL   CERVICAL SPECIAL TESTS:  AROM:  Extension : 50, reps no change                Flexion : 50, reps no change                Rt rotation: 53               Lt rotation: 45 Isometric mm testing: RT SB: 4- Lt SB :4 Extension:  5    TODAY'S TREATMENT:                                                                                                                              DATE:  10/26/22 Seated:  SB isometrics bilaterally 5X5"   Trapezius stretch 3X30" each side  Cervical excursions 5X each  Thoracic excursions with UE movements 5X each  Scapular retractions (freeze and squeeze)  eval 10/25/22 Sitting: Trapezius stretch 3 x 30" SB isometric 5 x 5"  B    PATIENT EDUCATION:  Education details: HEP Person educated: Patient Education method: Explanation, Verbal cues, and Handouts Education comprehension: returned demonstration  HOME EXERCISE PROGRAM: Access Code: [email or web address] URL:  [email or web address]  Date: 10/25/2022 Prepared by: Virgina Organ Exercises - Seated Upper Trapezius Stretch  -  3 x daily - 7 x weekly - 1 sets - 3 reps - 30" hold - Seated Isometric Cervical Sidebending  - 3 x daily - 7 x weekly - 1 sets - 5 reps - 5" hold  10/26/22 Cervical excursions 5X each Thoracic excursions with UE movements 5X each  ASSESSMENT:  CLINICAL IMPRESSION: Reviewed goals and POC moving forward.  Pt able to recall and complete given HEP correctly with adequate hold times with stretches. Added cervical and thoracic excursions to improve spinal mobility with minimal cues.  Updated HEP to include these.  Pt without c/o of pain or issues during or at completion of therapy today.  PT will continue to benefit from skilled therapy to maximize her function and decrease her pain.   OBJECTIVE IMPAIRMENTS: decreased ROM, decreased strength, increased muscle spasms, and pain.    PERSONAL FACTORS: Past/current experiences, Time since onset of injury/illness/exacerbation, and 1 comorbidity: OA  are also affecting patient's functional outcome.   REHAB POTENTIAL: Good  CLINICAL DECISION MAKING: Stable/uncomplicated  EVALUATION COMPLEXITY: Low   GOALS: Goals reviewed with patient? No  SHORT TERM GOALS: Target date: 11/08/2022  PT to be Independent in HEP in order  to improve her cervical rotation to 60 degrees for improved ROM for driving/ talking to people sitting beside her. Baseline:  Goal status: IN PROGRESS    LONG TERM GOALS: Target date: 7/30/243  PT to be Independent in HEP in order  to improve cervical rotation to 65 degrees B  Baseline:  Goal status: IN PROGRESS  2.  Pt to have not had any instance of excruciating pain in her neck.  Baseline:  Goal status: IN PROGRESS  3.  PT SB mm strength to be at least 4+/5 to demonstrate improved cervical stability. Baseline:  Goal status: IN PROGRESS  4.  Pt to state that she is no longer tender to  the touch  Baseline:  Goal status: IN PROGRESS     PLAN:  PT FREQUENCY: 2x/week  PT DURATION: 4 weeks  PLANNED INTERVENTIONS: Therapeutic exercises, Patient/Family education, Self Care, Joint mobilization, and Manual therapy  PLAN FOR NEXT SESSION: continue to progress cervical ROM.  Increase and improve posture.  Begin manual if needed.    Lurena Nida, PTA/CLT [facility] Ph: [phone]  10/26/2022, 1:17 PM

## 2022-11-04 ENCOUNTER — Ambulatory Visit (HOSPITAL_COMMUNITY): Payer: Medicare Other | Admitting: Physical Therapy

## 2022-11-04 DIAGNOSIS — R262 Difficulty in walking, not elsewhere classified: Secondary | ICD-10-CM | POA: Diagnosis not present

## 2022-11-04 DIAGNOSIS — M542 Cervicalgia: Secondary | ICD-10-CM | POA: Diagnosis not present

## 2022-11-04 DIAGNOSIS — G8929 Other chronic pain: Secondary | ICD-10-CM | POA: Diagnosis not present

## 2022-11-04 DIAGNOSIS — M546 Pain in thoracic spine: Secondary | ICD-10-CM | POA: Diagnosis not present

## 2022-11-04 DIAGNOSIS — M6281 Muscle weakness (generalized): Secondary | ICD-10-CM | POA: Diagnosis not present

## 2022-11-04 NOTE — Therapy (Signed)
OUTPATIENT PHYSICAL THERAPY TREATMENT  Patient Name: Adrienne [name] MRN: [id number] DOB:03-31-1946, 77 y.o., female Today's Date: 11/04/2022   END OF SESSION:   PT End of Session - 11/04/22 1640     Visit Number 3    Number of Visits 8    Date for PT Re-Evaluation 11/24/22    Authorization Type Medicare    Progress Note Due on Visit 8    PT Start Time 1602    PT Stop Time 1640    PT Time Calculation (min) 38 min    Activity Tolerance Patient tolerated treatment well    Behavior During Therapy [facility] for tasks assessed/performed                 Past Medical History:  Diagnosis Date   Allergy    Arthritis    Cataract    Diverticulosis 05/2009   GERD (gastroesophageal reflux disease)    Glaucoma    Hyperlipidemia    Hypertension    Past Surgical History:  Procedure Laterality Date   ABDOMINAL HYSTERECTOMY  04/26/1988   for fibroids ovaries remain    CHOLECYSTECTOMY  04/26/1978   COLONOSCOPY  05/27/2009   rare diverticula, small internal hemorrhoids. Next colonoscopy in 5-10 yrs.   COLONOSCOPY N/A 10/23/2018   Procedure: COLONOSCOPY;  Surgeon: West Bali, MD;  [facility];  Service: Endoscopy;  Laterality: N/A;  2:00pm   JOINT REPLACEMENT N/A    Phreesia 06/02/2020   left nipple inversion bx benign  04/26/1998   POLYPECTOMY  10/23/2018   Procedure: POLYPECTOMY;  Surgeon: West Bali, MD;  [facility];  Service: Endoscopy;;   TOTAL HIP ARTHROPLASTY Right 05/16/2020   Procedure: TOTAL HIP ARTHROPLASTY;  Surgeon: Frederico Hamman, MD;  Location[facility];  Service: Orthopedics;  Laterality: Right;   Patient Active Problem List   Diagnosis Date Noted   Large breasts 09/12/2022   Neck pain 09/12/2022   Thoracic back pain 09/12/2022   COVID-19 03/22/2022   Fatigue due to excessive exertion 03/12/2022   Skin tags, multiple acquired 08/05/2021   Epidermoid cyst of skin of scalp 08/05/2021   Headache 08/05/2021   Osteopenia 02/03/2021    Acquired skin tag 10/19/2020   Leg swelling 03/31/2020   Venous insufficiency of both lower extremities 03/31/2020   Vitamin D deficiency 03/06/2020   Prediabetes 03/06/2020   Knee pain, right 04/06/2019   Left ankle sprain 04/06/2019   Personal history of colonic polyps 07/12/2018   Ingrown toenail of both feet 03/23/2017   Hip pain, chronic, right 02/03/2016   Neck pain on left side 02/24/2015   GERD (gastroesophageal reflux disease) 12/26/2014   Diverticulosis of colon without hemorrhage 07/05/2014   FH: pancreatic cancer 01/26/2014   FH: glaucoma 03/24/2013   Overweight (BMI 25.0-29.9) 03/13/2012   Hyperlipidemia LDL goal <100 04/03/2009   Essential hypertension 04/03/2009    PCP: Syliva Overman  REFERRING PROVIDER: Kerri Perches, MD  REFERRING DIAG:  Diagnosis  M54.2 (ICD-10-CM) - Neck pain on left side  M54.6,G89.29 (ICD-10-CM) - Chronic bilateral thoracic back pain    THERAPY DIAG:  Cervicalgia  Rationale for Evaluation and Treatment: Rehabilitation  ONSET DATE: chronic noted 2016 with acute exacerbation in the past 6 months.  SUBJECTIVE STATEMENT: Pt states that she has no pain.  She has been completing the exercises without questions   Evaluation: Pt states that the excruciating pain comes and goes and she is not really sure what causes it.  It has happened twice.  She states that she uses ice which helps and she is tender to the touch.  The pain is on her Left side and occasionally will go down into her shoulder  Hand dominance: Right  PERTINENT HISTORY:  OA with cervical,back, hip and knee pain   PAIN:  Are you having pain? Yes: NPRS scale: 0/10; worst pain this week has been a 0/10 Pain location: Lt cervical Pain description: tender to touch   Aggravating factors: not sure Relieving factors: ice  PRECAUTIONS: None  WEIGHT BEARING RESTRICTIONS: No  FALLS:  Has patient fallen in last 6 months? No  PLOF: Independent  PATIENT GOALS: to not be tender to the touch, self care   NEXT MD VISIT: November 2024  OBJECTIVE:   DIAGNOSTIC FINDINGS:  Nothing recent  PATIENT SURVEYS:  FOTO 47  COGNITION: Overall cognitive status: Within functional limits for tasks assessed  SPOSTURE: No Significant postural limitations  PALPATION: Tender to touch with B mm spasm in upper trap with Lt greater than RT   CERVICAL ROM:   Active ROM A/PROM (deg) eval  Flexion 50  Extension 50  Right lateral flexion   Left lateral flexion   Right rotation 53  Left rotation 45   (Blank rows = not tested)  UPPER EXTREMITY ROM: WNL   CERVICAL SPECIAL TESTS:  AROM:  Extension : 50, reps no change                Flexion : 50, reps no change                Rt rotation: 53               Lt rotation: 45 Isometric mm testing: RT SB: 4- Lt SB :4 Extension:  5    TODAY'S TREATMENT:                                                                                                                              DATE:  11/04/22 Manual completed to include jt mobilization, traction and massage to improve motion Isometric SB, extension x 10 Sitting: cervical and thoracic excursion x 3 Retro shoulder rolls x 10 Scapular and cervical retraction x 10 combined Trap stretch  UBE backward x 4 minutes  10/26/22 Seated:  SB isometrics bilaterally 5X5"   Trapezius stretch 3X30" each side  Cervical excursions 5X each  Thoracic excursions with UE movements 5X each  Scapular retractions (freeze and squeeze)  eval 10/25/22 Sitting: Trapezius stretch 3 x 30" SB isometric 5 x 5"  B    PATIENT EDUCATION:  Education details: HEP Person educated: Patient Education method: Explanation, Verbal cues, and Handouts Education comprehension: returned  demonstration  HOME EXERCISE PROGRAM: Access Code: [email or web address] URL: [email or web address]  Date: 10/25/2022 Prepared by: Virgina Organ Exercises - Seated Upper Trapezius Stretch  - 3 x daily - 7 x weekly - 1 sets - 3 reps - 30" hold - Seated Isometric Cervical Sidebending  - 3 x daily - 7 x weekly - 1 sets - 5 reps - 5" hold  10/26/22 Cervical excursions 5X each Thoracic excursions with UE movements 5X each  ASSESSMENT:  CLINICAL IMPRESSION: Began session with manual with slight tightness noted on RT side of C3-4 and C4-5 noted improved motion following manual.  Pt able to complete all exercises with good technique after instruction. Pt without c/o of pain or issues during or at completion of therapy today.  PT will continue to benefit from skilled therapy to maximize her function and decrease her pain.   OBJECTIVE IMPAIRMENTS: decreased ROM, decreased strength, increased muscle spasms, and pain.    PERSONAL FACTORS: Past/current experiences, Time since onset of injury/illness/exacerbation, and 1 comorbidity: OA  are also affecting patient's functional outcome.   REHAB POTENTIAL: Good  CLINICAL DECISION MAKING: Stable/uncomplicated  EVALUATION COMPLEXITY: Low   GOALS: Goals reviewed with patient? No  SHORT TERM GOALS: Target date: 11/08/2022  PT to be Independent in HEP in order  to improve her cervical rotation to 60 degrees for improved ROM for driving/ talking to people sitting beside her. Baseline:  Goal status: IN PROGRESS    LONG TERM GOALS: Target date: 7/30/243  PT to be Independent in HEP in order  to improve cervical rotation to 65 degrees B  Baseline:  Goal status: IN PROGRESS  2.  Pt to have not had any instance of excruciating pain in her neck.  Baseline:  Goal status: IN PROGRESS  3.  PT SB mm strength to be at least 4+/5 to demonstrate improved cervical stability. Baseline:  Goal status: IN PROGRESS  4.  Pt to state that she is no  longer tender to the touch  Baseline:  Goal status: IN PROGRESS     PLAN:  PT FREQUENCY: 2x/week  PT DURATION: 4 weeks  PLANNED INTERVENTIONS: Therapeutic exercises, Patient/Family education, Self Care, Joint mobilization, and Manual therapy  PLAN FOR NEXT SESSION: wall push up, theraband postural exercisesl.   continue to progress cervical ROM.  Increase and improve posture.  Begin manual if needed.    Virgina Organ, PT CLT [phone]  Ph: [phone]  11/04/2022, 4:39 PM

## 2022-11-08 ENCOUNTER — Ambulatory Visit (HOSPITAL_COMMUNITY): Payer: Medicare Other | Admitting: Physical Therapy

## 2022-11-08 DIAGNOSIS — G8929 Other chronic pain: Secondary | ICD-10-CM | POA: Diagnosis not present

## 2022-11-08 DIAGNOSIS — M6281 Muscle weakness (generalized): Secondary | ICD-10-CM | POA: Diagnosis not present

## 2022-11-08 DIAGNOSIS — M546 Pain in thoracic spine: Secondary | ICD-10-CM | POA: Diagnosis not present

## 2022-11-08 DIAGNOSIS — M542 Cervicalgia: Secondary | ICD-10-CM

## 2022-11-08 DIAGNOSIS — R262 Difficulty in walking, not elsewhere classified: Secondary | ICD-10-CM | POA: Diagnosis not present

## 2022-11-08 NOTE — Therapy (Signed)
OUTPATIENT PHYSICAL THERAPY TREATMENT  Patient Name: Adrienne [name] MRN: [id number] DOB:05/14/1945, 77 y.o., female Today's Date: 11/08/2022   END OF SESSION:   PT End of Session - 11/08/22 1510     Visit Number 4    Number of Visits 8    Date for PT Re-Evaluation 11/24/22    Authorization Type Medicare    Progress Note Due on Visit 8    PT Start Time 1431    PT Stop Time 1510    PT Time Calculation (min) 39 min    Activity Tolerance Patient tolerated treatment well    Behavior During Therapy [facility] for tasks assessed/performed         Past Medical History:  Diagnosis Date   Allergy    Arthritis    Cataract    Diverticulosis 05/2009   GERD (gastroesophageal reflux disease)    Glaucoma    Hyperlipidemia    Hypertension    Past Surgical History:  Procedure Laterality Date   ABDOMINAL HYSTERECTOMY  04/26/1988   for fibroids ovaries remain    CHOLECYSTECTOMY  04/26/1978   COLONOSCOPY  05/27/2009   rare diverticula, small internal hemorrhoids. Next colonoscopy in 5-10 yrs.   COLONOSCOPY N/A 10/23/2018   Procedure: COLONOSCOPY;  Surgeon: West Bali, MD;  Location: [facility];  Service: Endoscopy;  Laterality: N/A;  2:00pm   JOINT REPLACEMENT N/A    Phreesia 06/02/2020   left nipple inversion bx benign  04/26/1998   POLYPECTOMY  10/23/2018   Procedure: POLYPECTOMY;  Surgeon: West Bali, MD;  Location: [facility];  Service: Endoscopy;;   TOTAL HIP ARTHROPLASTY Right 05/16/2020   Procedure: TOTAL HIP ARTHROPLASTY;  Surgeon: Frederico Hamman, MD;  Location: [facility];  Service: Orthopedics;  Laterality: Right;   Patient Active Problem List   Diagnosis Date Noted   Large breasts 09/12/2022   Neck pain 09/12/2022   Thoracic back pain 09/12/2022   COVID-19 03/22/2022   Fatigue due to excessive exertion 03/12/2022   Skin tags, multiple acquired 08/05/2021   Epidermoid cyst of skin of scalp 08/05/2021   Headache 08/05/2021   Osteopenia 02/03/2021   Acquired  skin tag 10/19/2020   Leg swelling 03/31/2020   Venous insufficiency of both lower extremities 03/31/2020   Vitamin D deficiency 03/06/2020   Prediabetes 03/06/2020   Knee pain, right 04/06/2019   Left ankle sprain 04/06/2019   Personal history of colonic polyps 07/12/2018   Ingrown toenail of both feet 03/23/2017   Hip pain, chronic, right 02/03/2016   Neck pain on left side 02/24/2015   GERD (gastroesophageal reflux disease) 12/26/2014   Diverticulosis of colon without hemorrhage 07/05/2014   FH: pancreatic cancer 01/26/2014   FH: glaucoma 03/24/2013   Overweight (BMI 25.0-29.9) 03/13/2012   Hyperlipidemia LDL goal <100 04/03/2009   Essential hypertension 04/03/2009    PCP: Syliva Overman  REFERRING PROVIDER: Kerri Perches, MD  REFERRING DIAG:  Diagnosis  M54.2 (ICD-10-CM) - Neck pain on left side  M54.6,G89.29 (ICD-10-CM) - Chronic bilateral thoracic back pain    THERAPY DIAG:  Cervicalgia  Muscle weakness (generalized)  Rationale for Evaluation and Treatment: Rehabilitation  ONSET DATE: chronic noted 2016 with acute exacerbation in the past 6 months.  SUBJECTIVE STATEMENT: PT states that she has been doing the exercises.  States the only time she hurts now is when she is touching the area.  Evaluation: Pt states that the excruciating pain comes and goes and she is not really sure what causes it.  It has happened twice.  She states that she uses ice which helps and she is tender to the touch.  The pain is on her Left side and occasionally will go down into her shoulder  Hand dominance: Right  PERTINENT HISTORY:  OA with cervical,back, hip and knee pain   PAIN:  Are you having pain? Yes: NPRS scale: 0/10; worst pain this week has been a 0/10 Pain location: Lt  cervical Pain description: tender to touch  Aggravating factors: not sure Relieving factors: ice  PRECAUTIONS: None  WEIGHT BEARING RESTRICTIONS: No  FALLS:  Has patient fallen in last 6 months? No  PLOF: Independent  PATIENT GOALS: to not be tender to the touch, self care   NEXT MD VISIT: November 2024  OBJECTIVE:   DIAGNOSTIC FINDINGS:  Nothing recent  PATIENT SURVEYS:  FOTO 47  COGNITION: Overall cognitive status: Within functional limits for tasks assessed  SPOSTURE: No Significant postural limitations  PALPATION: Tender to touch with B mm spasm in upper trap with Lt greater than RT   CERVICAL ROM:   Active ROM A/PROM (deg) eval  Flexion 50  Extension 50  Right lateral flexion   Left lateral flexion   Right rotation 53  Left rotation 45   (Blank rows = not tested)  UPPER EXTREMITY ROM: WNL   CERVICAL SPECIAL TESTS:  AROM:  Extension : 50, reps no change                Flexion : 50, reps no change                Rt rotation: 53               Lt rotation: 45 Isometric mm testing: RT SB: 4- Lt SB :4 Extension:  5    TODAY'S TREATMENT:                                                                                                                              DATE:  11/08/22 Standing: Wall pushup x 10 Postural thera -band exercises.  Scapular retraction x 10 green Rows x 10 green Shoulder extension x 10  green Sitting:  Cervical excursions x 10 Thoracic excursions x 10 Thoracic extension over roll x 10 X to V x 10  W back 1# x 10 UBE backward x 10     11/04/22 Manual completed to include jt mobilization, traction and massage to improve motion Isometric SB, extension x 10 Sitting: cervical and thoracic excursion x 3 Retro shoulder rolls x 10 Scapular and cervical retraction x 10 combined Trap stretch  UBE backward x 4 minutes  10/26/22 Seated:  SB isometrics bilaterally  5X5"   Trapezius stretch 3X30" each side  Cervical excursions  5X each  Thoracic excursions with UE movements 5X each  Scapular retractions (freeze and squeeze)  eval 10/25/22 Sitting: Trapezius stretch 3 x 30" SB isometric 5 x 5"  B    PATIENT EDUCATION:  Education details: HEP Person educated: Patient Education method: Explanation, Verbal cues, and Handouts Education comprehension: returned demonstration  HOME EXERCISE PROGRAM: Access Code: [email or web address] URL: [email or web address]  Date: 10/25/2022 Prepared by: Virgina Organ Exercises - Seated Upper Trapezius Stretch  - 3 x daily - 7 x weekly - 1 sets - 3 reps - 30" hold - Seated Isometric Cervical Sidebending  - 3 x daily - 7 x weekly - 1 sets - 5 reps - 5" hold  10/26/22 Cervical excursions 5X each Thoracic excursions with UE movements 5X each   11/08/22 - Seated Scapular Retraction with External Rotation  - 3 x daily - 7 x weekly - 1 sets - 10 reps - 3-5" hold - Seated Isometric Cervical Extension  - 3 x daily - 7 x weekly - 1 sets - 10 reps - 3-5" hold - Wall Push Up  - 3 x daily - 7 x weekly - 1 sets - 10 reps - 3-5" hold  ASSESSMENT:  CLINICAL IMPRESSION:  Treatment focused on cervical stability.    Pt able to complete all exercises with good technique after instruction. Manual demonstrates moderate spasm of RT upper trap.  PT will continue to benefit from skilled therapy to maximize her function and decrease her pain.   OBJECTIVE IMPAIRMENTS: decreased ROM, decreased strength, increased muscle spasms, and pain.    PERSONAL FACTORS: Past/current experiences, Time since onset of injury/illness/exacerbation, and 1 comorbidity: OA  are also affecting patient's functional outcome.   REHAB POTENTIAL: Good  CLINICAL DECISION MAKING: Stable/uncomplicated  EVALUATION COMPLEXITY: Low   GOALS: Goals reviewed with patient? No  SHORT TERM GOALS: Target date: 11/08/2022  PT to be Independent in HEP in order  to improve her cervical rotation to 60 degrees for improved ROM  for driving/ talking to people sitting beside her. Baseline:  Goal status: IN PROGRESS    LONG TERM GOALS: Target date: 7/30/243  PT to be Independent in HEP in order  to improve cervical rotation to 65 degrees B  Baseline:  Goal status: IN PROGRESS  2.  Pt to have not had any instance of excruciating pain in her neck.  Baseline:  Goal status: IN PROGRESS  3.  PT SB mm strength to be at least 4+/5 to demonstrate improved cervical stability. Baseline:  Goal status: IN PROGRESS  4.  Pt to state that she is no longer tender to the touch  Baseline:  Goal status: IN PROGRESS     PLAN:  PT FREQUENCY: 2x/week  PT DURATION: 4 weeks  PLANNED INTERVENTIONS: Therapeutic exercises, Patient/Family education, Self Care, Joint mobilization, and Manual therapy  PLAN FOR NEXT SESSION: wall push up, theraband postural exercisesl.   continue to progress cervical ROM.  Increase and improve posture.  Begin manual if needed.    Virgina Organ, PT CLT [phone]  Ph: [phone]  11/08/2022, 3:14 PM

## 2022-11-12 ENCOUNTER — Ambulatory Visit (HOSPITAL_COMMUNITY): Payer: Medicare Other | Admitting: Physical Therapy

## 2022-11-12 DIAGNOSIS — M542 Cervicalgia: Secondary | ICD-10-CM | POA: Diagnosis not present

## 2022-11-12 DIAGNOSIS — M546 Pain in thoracic spine: Secondary | ICD-10-CM | POA: Diagnosis not present

## 2022-11-12 DIAGNOSIS — R262 Difficulty in walking, not elsewhere classified: Secondary | ICD-10-CM | POA: Diagnosis not present

## 2022-11-12 DIAGNOSIS — G8929 Other chronic pain: Secondary | ICD-10-CM | POA: Diagnosis not present

## 2022-11-12 DIAGNOSIS — M6281 Muscle weakness (generalized): Secondary | ICD-10-CM | POA: Diagnosis not present

## 2022-11-12 NOTE — Therapy (Signed)
OUTPATIENT PHYSICAL THERAPY TREATMENT  Patient Name: Adrienne [name] MRN: [id number] DOB:Nov 10, 1945, 77 y.o., female Today's Date: 11/12/2022   END OF SESSION:   PT End of Session - 11/12/22 0947     Visit Number 5    Number of Visits 8    Date for PT Re-Evaluation 11/24/22    Authorization Type Medicare    Progress Note Due on Visit 8    PT Start Time 0947    PT Stop Time 1030    PT Time Calculation (min) 43 min    Activity Tolerance Patient tolerated treatment well    Behavior During Therapy [facility] for tasks assessed/performed              Past Medical History:  Diagnosis Date   Allergy    Arthritis    Cataract    Diverticulosis 05/2009   GERD (gastroesophageal reflux disease)    Glaucoma    Hyperlipidemia    Hypertension    Past Surgical History:  Procedure Laterality Date   ABDOMINAL HYSTERECTOMY  04/26/1988   for fibroids ovaries remain    CHOLECYSTECTOMY  04/26/1978   COLONOSCOPY  05/27/2009   rare diverticula, small internal hemorrhoids. Next colonoscopy in 5-10 yrs.   COLONOSCOPY N/A 10/23/2018   Procedure: COLONOSCOPY;  Surgeon: West Bali, MD;  Location: [facility];  Service: Endoscopy;  Laterality: N/A;  2:00pm   JOINT REPLACEMENT N/A    Phreesia 06/02/2020   left nipple inversion bx benign  04/26/1998   POLYPECTOMY  10/23/2018   Procedure: POLYPECTOMY;  Surgeon: West Bali, MD;  Location: [facility];  Service: Endoscopy;;   TOTAL HIP ARTHROPLASTY Right 05/16/2020   Procedure: TOTAL HIP ARTHROPLASTY;  Surgeon: Frederico Hamman, MD;  Location: [facility];  Service: Orthopedics;  Laterality: Right;   Patient Active Problem List   Diagnosis Date Noted   Large breasts 09/12/2022   Neck pain 09/12/2022   Thoracic back pain 09/12/2022   COVID-19 03/22/2022   Fatigue due to excessive exertion 03/12/2022   Skin tags, multiple acquired 08/05/2021   Epidermoid cyst of skin of scalp 08/05/2021   Headache 08/05/2021   Osteopenia 02/03/2021    Acquired skin tag 10/19/2020   Leg swelling 03/31/2020   Venous insufficiency of both lower extremities 03/31/2020   Vitamin D deficiency 03/06/2020   Prediabetes 03/06/2020   Knee pain, right 04/06/2019   Left ankle sprain 04/06/2019   Personal history of colonic polyps 07/12/2018   Ingrown toenail of both feet 03/23/2017   Hip pain, chronic, right 02/03/2016   Neck pain on left side 02/24/2015   GERD (gastroesophageal reflux disease) 12/26/2014   Diverticulosis of colon without hemorrhage 07/05/2014   FH: pancreatic cancer 01/26/2014   FH: glaucoma 03/24/2013   Overweight (BMI 25.0-29.9) 03/13/2012   Hyperlipidemia LDL goal <100 04/03/2009   Essential hypertension 04/03/2009    PCP: Syliva Overman  REFERRING PROVIDER: Kerri Perches, MD  REFERRING DIAG:  Diagnosis  M54.2 (ICD-10-CM) - Neck pain on left side  M54.6,G89.29 (ICD-10-CM) - Chronic bilateral thoracic back pain    THERAPY DIAG:  Cervicalgia  Muscle weakness (generalized)  Rationale for Evaluation and Treatment: Rehabilitation  ONSET DATE: chronic noted 2016 with acute exacerbation in the past 6 months.  SUBJECTIVE STATEMENT: PT states that she had a set back this week she is not sure what caused it but the pain did not stay as long.  Evaluation: Pt states that the excruciating pain comes and goes and she is not really sure what causes it.  It has happened twice.  She states that she uses ice which helps and she is tender to the touch.  The pain is on her Left side and occasionally will go down into her shoulder  Hand dominance: Right  PERTINENT HISTORY:  OA with cervical,back, hip and knee pain   PAIN:  Are you having pain? Yes: NPRS scale: 0/10; worst pain this week has been a 0/10 Pain location: Lt  cervical Pain description: tender to touch  Aggravating factors: not sure Relieving factors: ice  PRECAUTIONS: None  WEIGHT BEARING RESTRICTIONS: No  FALLS:  Has patient fallen in last 6 months? No  PLOF: Independent  PATIENT GOALS: to not be tender to the touch, self care   NEXT MD VISIT: November 2024  OBJECTIVE:   DIAGNOSTIC FINDINGS:  Nothing recent  PATIENT SURVEYS:  FOTO 47  COGNITION: Overall cognitive status: Within functional limits for tasks assessed  SPOSTURE: No Significant postural limitations  PALPATION: Tender to touch with B mm spasm in upper trap with Lt greater than RT   CERVICAL ROM:   Active ROM A/PROM (deg) eval  Flexion 50  Extension 50  Right lateral flexion   Left lateral flexion   Right rotation 53  Left rotation 45   (Blank rows = not tested)  UPPER EXTREMITY ROM: WNL   CERVICAL SPECIAL TESTS:  AROM:  Extension : 50, reps no change                Flexion : 50, reps no change                Rt rotation: 53               Lt rotation: 45 Isometric mm testing: RT SB: 4- Lt SB :4 Extension:  5    TODAY'S TREATMENT:                                                                                                                              DATE: 11/12/22 Cervical excursion x 3 Thoracic excursion x 3 Thoracic mob with bolster x 5  Upper trap stretch x 3 for 30" X to V 2# x 5 , repeat 5X without wt.  W back 2# x 10  Retro circles with shoulders x 10  Punch downs with green theraband x 10  Postural strengthening with green theraband   Rows, scapular retraction and shoulder extension x 15 Manual to decrease spasms   11/08/22 Standing: Wall pushup x 10 Postural thera -band exercises.  Scapular retraction x 10 green Rows x 10 green Shoulder extension x 10  green Sitting:  Cervical excursions x 10 Thoracic excursions x 10  Thoracic extension over roll x 10 X to V x 10  W back 1# x 10 UBE backward x 10      11/04/22 Manual completed to include jt mobilization, traction and massage to improve motion Isometric SB, extension x 10 Sitting: cervical and thoracic excursion x 3 Retro shoulder rolls x 10 Scapular and cervical retraction x 10 combined Trap stretch  UBE backward x 4 minutes   PATIENT EDUCATION:  Education details: HEP Person educated: Patient Education method: Programmer, multimedia, Verbal cues, and Handouts Education comprehension: returned demonstration  HOME EXERCISE PROGRAM: Access Code: [email or web address] URL: [email or web address] Date: 10/25/2022 Prepared by: Virgina Organ Exercises - Seated Upper Trapezius Stretch  - 3 x daily - 7 x weekly - 1 sets - 3 reps - 30" hold - Seated Isometric Cervical Sidebending  - 3 x daily - 7 x weekly - 1 sets - 5 reps - 5" hold  10/26/22 Cervical excursions 5X each Thoracic excursions with UE movements 5X each   11/08/22 - Seated Scapular Retraction with External Rotation  - 3 x daily - 7 x weekly - 1 sets - 10 reps - 3-5" hold - Seated Isometric Cervical Extension  - 3 x daily - 7 x weekly - 1 sets - 10 reps - 3-5" hold - Wall Push Up  - 3 x daily - 7 x weekly - 1 sets - 10 reps - 3-5" hold           11/12/22  Scapular depression with green theraband.  ASSESSMENT:  CLINICAL IMPRESSION:  Therapist noted pt to have improved rotation.   PT given theraband for punch down exercise.  Pt able to complete all exercises with good technique after instruction. Manual demonstrates moderate spasm of RT upper trap.  PT will continue to benefit from skilled therapy to maximize her function and decrease her pain.   OBJECTIVE IMPAIRMENTS: decreased ROM, decreased strength, increased muscle spasms, and pain.    PERSONAL FACTORS: Past/current experiences, Time since onset of injury/illness/exacerbation, and 1 comorbidity: OA  are also affecting patient's functional outcome.   REHAB POTENTIAL: Good  CLINICAL DECISION MAKING:  Stable/uncomplicated  EVALUATION COMPLEXITY: Low   GOALS: Goals reviewed with patient? No  SHORT TERM GOALS: Target date: 11/08/2022  PT to be Independent in HEP in order  to improve her cervical rotation to 60 degrees for improved ROM for driving/ talking to people sitting beside her. Baseline:  Goal status: IN PROGRESS    LONG TERM GOALS: Target date: 7/30/243  PT to be Independent in HEP in order  to improve cervical rotation to 65 degrees B  Baseline:  Goal status: IN PROGRESS  2.  Pt to have not had any instance of excruciating pain in her neck.  Baseline:  Goal status: IN PROGRESS  3.  PT SB mm strength to be at least 4+/5 to demonstrate improved cervical stability. Baseline:  Goal status: IN PROGRESS  4.  Pt to state that she is no longer tender to the touch  Baseline:  Goal status: IN PROGRESS     PLAN:  PT FREQUENCY: 2x/week  PT DURATION: 4 weeks  PLANNED INTERVENTIONS: Therapeutic exercises, Patient/Family education, Self Care, Joint mobilization, and Manual therapy  PLAN FOR NEXT SESSION: wall push up, theraband postural exercisesl.   continue to progress cervical ROM.  Increase and improve posture.  Begin manual if needed.    Virgina Organ, PT CLT [phone]  Ph: [phone]  11/12/2022, 10:32 AM

## 2022-11-17 ENCOUNTER — Ambulatory Visit (HOSPITAL_COMMUNITY): Payer: Medicare Other | Admitting: Physical Therapy

## 2022-11-17 DIAGNOSIS — M546 Pain in thoracic spine: Secondary | ICD-10-CM | POA: Diagnosis not present

## 2022-11-17 DIAGNOSIS — M542 Cervicalgia: Secondary | ICD-10-CM

## 2022-11-17 DIAGNOSIS — R262 Difficulty in walking, not elsewhere classified: Secondary | ICD-10-CM | POA: Diagnosis not present

## 2022-11-17 DIAGNOSIS — M6281 Muscle weakness (generalized): Secondary | ICD-10-CM | POA: Diagnosis not present

## 2022-11-17 DIAGNOSIS — G8929 Other chronic pain: Secondary | ICD-10-CM | POA: Diagnosis not present

## 2022-11-17 NOTE — Therapy (Signed)
OUTPATIENT PHYSICAL THERAPY TREATMENT  Patient Name: Adrienne [name] MRN: [id number] DOB:[birth year], 77 y.o., female Today's Date: 11/17/2022   END OF SESSION:   PT End of Session - 11/17/22 1608     Visit Number 6    Number of Visits 8    Date for PT Re-Evaluation 11/24/22    Authorization Type Medicare    Progress Note Due on Visit 8    PT Start Time 1520    PT Stop Time 1600    PT Time Calculation (min) 40 min    Activity Tolerance Patient tolerated treatment well    Behavior During Therapy [facility] for tasks assessed/performed               Past Medical History:  Diagnosis Date   Allergy    Arthritis    Cataract    Diverticulosis 05/2009   GERD (gastroesophageal reflux disease)    Glaucoma    Hyperlipidemia    Hypertension    Past Surgical History:  Procedure Laterality Date   ABDOMINAL HYSTERECTOMY  04/26/1988   for fibroids ovaries remain    CHOLECYSTECTOMY  04/26/1978   COLONOSCOPY  05/27/2009   rare diverticula, small internal hemorrhoids. Next colonoscopy in 5-10 yrs.   COLONOSCOPY N/A 10/23/2018   Procedure: COLONOSCOPY;  Surgeon: West Bali, MD;  [facility];  Service: Endoscopy;  Laterality: N/A;  2:00pm   JOINT REPLACEMENT N/A    Phreesia 06/02/2020   left nipple inversion bx benign  04/26/1998   POLYPECTOMY  10/23/2018   Procedure: POLYPECTOMY;  Surgeon: West Bali, MD;  Location: [facility];  Service: Endoscopy;;   TOTAL HIP ARTHROPLASTY Right 05/16/2020   Procedure: TOTAL HIP ARTHROPLASTY;  Surgeon: Frederico Hamman, MD;  Location: [facility];  Service: Orthopedics;  Laterality: Right;   Patient Active Problem List   Diagnosis Date Noted   Large breasts 09/12/2022   Neck pain 09/12/2022   Thoracic back pain 09/12/2022   COVID-19 03/22/2022   Fatigue due to excessive exertion 03/12/2022   Skin tags, multiple acquired 08/05/2021   Epidermoid cyst of skin of scalp 08/05/2021   Headache 08/05/2021   Osteopenia 02/03/2021    Acquired skin tag 10/19/2020   Leg swelling 03/31/2020   Venous insufficiency of both lower extremities 03/31/2020   Vitamin D deficiency 03/06/2020   Prediabetes 03/06/2020   Knee pain, right 04/06/2019   Left ankle sprain 04/06/2019   Personal history of colonic polyps 07/12/2018   Ingrown toenail of both feet 03/23/2017   Hip pain, chronic, right 02/03/2016   Neck pain on left side 02/24/2015   GERD (gastroesophageal reflux disease) 12/26/2014   Diverticulosis of colon without hemorrhage 07/05/2014   FH: pancreatic cancer 01/26/2014   FH: glaucoma 03/24/2013   Overweight (BMI 25.0-29.9) 03/13/2012   Hyperlipidemia LDL goal <100 04/03/2009   Essential hypertension 04/03/2009    PCP: Syliva Overman  REFERRING PROVIDER: Kerri Perches, MD  REFERRING DIAG:  Diagnosis  M54.2 (ICD-10-CM) - Neck pain on left side  M54.6,G89.29 (ICD-10-CM) - Chronic bilateral thoracic back pain    THERAPY DIAG:  Cervicalgia  Muscle weakness (generalized)  Rationale for Evaluation and Treatment: Rehabilitation  ONSET DATE: chronic noted 2016 with acute exacerbation in the past 6 months.  SUBJECTIVE STATEMENT: PT states that she had a set back this week she is not sure what caused it but the pain did not stay as long.  Evaluation: Pt states that the excruciating pain comes and goes and she is not really sure what causes it.  It has happened twice.  She states that she uses ice which helps and she is tender to the touch.  The pain is on her Left side and occasionally will go down into her shoulder  Hand dominance: Right  PERTINENT HISTORY:  OA with cervical,back, hip and knee pain   PAIN:  Are you having pain? Yes: NPRS scale: 0/10; worst pain this week has been a 0/10 Pain location: Lt  cervical Pain description: tender to touch  Aggravating factors: not sure Relieving factors: ice  PRECAUTIONS: None  [name] BEARING RESTRICTIONS: No  FALLS:  Has patient fallen in last 6 months? No  PLOF: Independent  PATIENT GOALS: to not be tender to the touch, self care   NEXT MD VISIT: November 2024  OBJECTIVE:   DIAGNOSTIC FINDINGS:  Nothing recent  PATIENT SURVEYS:  FOTO 47  COGNITION: Overall cognitive status: Within functional limits for tasks assessed  SPOSTURE: No Significant postural limitations  PALPATION: Tender to touch with B mm spasm in upper trap with Lt greater than RT   CERVICAL ROM:   Active ROM A/PROM (deg) eval  Flexion 50  Extension 50  Right lateral flexion   Left lateral flexion   Right rotation 53  Left rotation 45   (Blank rows = not tested)  UPPER EXTREMITY ROM: WNL   CERVICAL SPECIAL TESTS:  AROM:  Extension : 50, reps no change                Flexion : 50, reps no change                Rt rotation: 53               Lt rotation: 45 Isometric mm testing: RT SB: 4- Lt SB :4 Extension:  5    TODAY'S TREATMENT:                                                                                                                              DATE: 11/17/22 UBE 4 minutes backward level 1 Standing GTB scap retraction, shoulder ext, rows 2X10 each Seated trap stretch 3X30"  Levator stretch 3X30" Manual to Lt upper trap to reduce mm spasm Shown how to use cane handle to relieve spasms (given info on theracane)  11/12/22 Cervical excursion x 3 Thoracic excursion x 3 Thoracic mob with bolster x 5  Upper trap stretch x 3 for 30" X to V 2# x 5 , repeat 5X without wt.  W back 2# x 10  Retro circles with shoulders x 10  Punch downs with green theraband x 10  Postural strengthening with green theraband   Rows, scapular retraction  and shoulder extension x 15 Manual to decrease spasms   11/08/22 Standing: Wall pushup x  10 Postural thera -band exercises.  Scapular retraction x 10 green Rows x 10 green Shoulder extension x 10  green Sitting:  Cervical excursions x 10 Thoracic excursions x 10 Thoracic extension over roll x 10 X to V x 10  W back 1# x 10 UBE backward x 10     11/04/22 Manual completed to include jt mobilization, traction and massage to improve motion Isometric SB, extension x 10 Sitting: cervical and thoracic excursion x 3 Retro shoulder rolls x 10 Scapular and cervical retraction x 10 combined Trap stretch  UBE backward x 4 minutes   PATIENT EDUCATION:  Education details: HEP Person educated: Patient Education method: Programmer, multimedia, Verbal cues, and Handouts Education comprehension: returned demonstration  HOME EXERCISE PROGRAM: Access Code: [email or web address] URL: [email or web address] Date: 10/25/2022 Prepared by: Virgina Organ Exercises - Seated Upper Trapezius Stretch  - 3 x daily - 7 x weekly - 1 sets - 3 reps - 30" hold - Seated Isometric Cervical Sidebending  - 3 x daily - 7 x weekly - 1 sets - 5 reps - 5" hold  10/26/22 Cervical excursions 5X each Thoracic excursions with UE movements 5X each   11/08/22 - Seated Scapular Retraction with External Rotation  - 3 x daily - 7 x weekly - 1 sets - 10 reps - 3-5" hold - Seated Isometric Cervical Extension  - 3 x daily - 7 x weekly - 1 sets - 10 reps - 3-5" hold - Wall Push Up  - 3 x daily - 7 x weekly - 1 sets - 10 reps - 3-5" hold            11/12/22  Scapular depression with green theraband.  11/17/2022 - Gentle Levator Scapulae Stretch  - 3 x daily - 7 x weekly - 1 sets - 3 reps - 30 sec hold  ASSESSMENT:  CLINICAL IMPRESSION:  Began session with UBE and educated on how to complete postural theraband at home.  Pt provided with green theraband and hook.  Shown alternative ways to secure to door or other structures.  Instructed with levator stretch and given written instructions as well.  Spent majority of time  on manual for Lt upper trap which was very tight with multiple spasms.  Unable to fully resolve these, however pt reported feeling better at end of session.  Pt instructed with how to use a looped handle of a cane to help massage trigger areas and shown a theracane on [employer] if she would like to order one.  Pt able to complete all exercises with good technique after instruction. PT will continue to benefit from skilled therapy to maximize her function and decrease her pain.   OBJECTIVE IMPAIRMENTS: decreased ROM, decreased strength, increased muscle spasms, and pain.    PERSONAL FACTORS: Past/current experiences, Time since onset of injury/illness/exacerbation, and 1 comorbidity: OA  are also affecting patient's functional outcome.   REHAB POTENTIAL: Good  CLINICAL DECISION MAKING: Stable/uncomplicated  EVALUATION COMPLEXITY: Low   GOALS: Goals reviewed with patient? No  SHORT TERM GOALS: Target date: 11/08/2022  PT to be Independent in HEP in order  to improve her cervical rotation to 60 degrees for improved ROM for driving/ talking to people sitting beside her. Baseline:  Goal status: IN PROGRESS    LONG TERM GOALS: Target date: 7/30/243  PT to be Independent in HEP in order  to improve cervical rotation to 65  degrees B  Baseline:  Goal status: IN PROGRESS  2.  Pt to have not had any instance of excruciating pain in her neck.  Baseline:  Goal status: IN PROGRESS  3.  PT SB mm strength to be at least 4+/5 to demonstrate improved cervical stability. Baseline:  Goal status: IN PROGRESS  4.  Pt to state that she is no longer tender to the touch  Baseline:  Goal status: IN PROGRESS     PLAN:  PT FREQUENCY: 2x/week  PT DURATION: 4 weeks  PLANNED INTERVENTIONS: Therapeutic exercises, Patient/Family education, Self Care, Joint mobilization, and Manual therapy  PLAN FOR NEXT SESSION: next session add wall push up.  Progress postural strength.    Lurena Nida,  PTA/CLT [facility] Ph: [phone]  11/17/2022, 4:08 PM

## 2022-11-19 ENCOUNTER — Ambulatory Visit (HOSPITAL_COMMUNITY): Payer: Medicare Other | Admitting: Physical Therapy

## 2022-11-19 DIAGNOSIS — R262 Difficulty in walking, not elsewhere classified: Secondary | ICD-10-CM | POA: Diagnosis not present

## 2022-11-19 DIAGNOSIS — M542 Cervicalgia: Secondary | ICD-10-CM

## 2022-11-19 DIAGNOSIS — M6281 Muscle weakness (generalized): Secondary | ICD-10-CM

## 2022-11-19 DIAGNOSIS — G8929 Other chronic pain: Secondary | ICD-10-CM | POA: Diagnosis not present

## 2022-11-19 DIAGNOSIS — M546 Pain in thoracic spine: Secondary | ICD-10-CM | POA: Diagnosis not present

## 2022-11-19 NOTE — Therapy (Signed)
OUTPATIENT PHYSICAL THERAPY TREATMENT  Patient Name: Adrienne [name] MRN: [id number] DOB:1945-05-31, 77 y.o., female Today's Date: 11/19/2022   END OF SESSION:   PT End of Session - 11/19/22 1515    Visit Number 7    Number of Visits 8    Date for PT Re-Evaluation 11/24/22    Authorization Type Medicare    Progress Note Due on Visit 8    PT Start Time 1435    PT Stop Time 1515    PT Time Calculation (min) 40 min    Activity Tolerance Patient tolerated treatment well    Behavior During Therapy [facility] for tasks assessed/performed               Past Medical History:  Diagnosis Date   Allergy    Arthritis    Cataract    Diverticulosis 05/2009   GERD (gastroesophageal reflux disease)    Glaucoma    Hyperlipidemia    Hypertension    Past Surgical History:  Procedure Laterality Date   ABDOMINAL HYSTERECTOMY  04/26/1988   for fibroids ovaries remain    CHOLECYSTECTOMY  04/26/1978   COLONOSCOPY  05/27/2009   rare diverticula, small internal hemorrhoids. Next colonoscopy in 5-10 yrs.   COLONOSCOPY N/A 10/23/2018   Procedure: COLONOSCOPY;  Surgeon: West Bali, MD;  [facility];  Service: Endoscopy;  Laterality: N/A;  2:00pm   JOINT REPLACEMENT N/A    Phreesia 06/02/2020   left nipple inversion bx benign  04/26/1998   POLYPECTOMY  10/23/2018   Procedure: POLYPECTOMY;  Surgeon: West Bali, MD;  [facility];  Service: Endoscopy;;   TOTAL HIP ARTHROPLASTY Right 05/16/2020   Procedure: TOTAL HIP ARTHROPLASTY;  Surgeon: Frederico Hamman, MD;  Location: [facility];  Service: Orthopedics;  Laterality: Right;   Patient Active Problem List   Diagnosis Date Noted   Large breasts 09/12/2022   Neck pain 09/12/2022   Thoracic back pain 09/12/2022   COVID-19 03/22/2022   Fatigue due to excessive exertion 03/12/2022   Skin tags, multiple acquired 08/05/2021   Epidermoid cyst of skin of scalp 08/05/2021   Headache 08/05/2021   Osteopenia 02/03/2021    Acquired skin tag 10/19/2020   Leg swelling 03/31/2020   Venous insufficiency of both lower extremities 03/31/2020   Vitamin D deficiency 03/06/2020   Prediabetes 03/06/2020   Knee pain, right 04/06/2019   Left ankle sprain 04/06/2019   Personal history of colonic polyps 07/12/2018   Ingrown toenail of both feet 03/23/2017   Hip pain, chronic, right 02/03/2016   Neck pain on left side 02/24/2015   GERD (gastroesophageal reflux disease) 12/26/2014   Diverticulosis of colon without hemorrhage 07/05/2014   FH: pancreatic cancer 01/26/2014   FH: glaucoma 03/24/2013   Overweight (BMI 25.0-29.9) 03/13/2012   Hyperlipidemia LDL goal <100 04/03/2009   Essential hypertension 04/03/2009    PCP: Syliva Overman  REFERRING PROVIDER: Kerri Perches, MD  REFERRING DIAG:  Diagnosis  M54.2 (ICD-10-CM) - Neck pain on left side  M54.6,G89.29 (ICD-10-CM) - Chronic bilateral thoracic back pain    THERAPY DIAG:  Cervicalgia  Muscle weakness (generalized)  Rationale for Evaluation and Treatment: Rehabilitation  ONSET DATE: chronic noted 2016 with acute exacerbation in the past 6 months.  SUBJECTIVE STATEMENT:  Evaluation: Pt states that the excruciating pain comes and goes and she is not really sure what causes it.  It has happened twice.  She states that she uses ice which helps and she is tender to the touch.  The pain is on her Left side and occasionally will go down into her shoulder  Hand dominance: Right  PERTINENT HISTORY:  OA with cervical,back, hip and knee pain   PAIN:  Are you having pain? Yes: NPRS scale: 0/10; worst pain this week has been a 0/10 Pain location: Lt cervical Pain description: tender to touch  Aggravating factors: not sure Relieving factors:  ice  PRECAUTIONS: None  WEIGHT BEARING RESTRICTIONS: No  FALLS:  Has patient fallen in last 6 months? No  PLOF: Independent  PATIENT GOALS: to not be tender to the touch, self care   NEXT MD VISIT: November 2024  OBJECTIVE:   DIAGNOSTIC FINDINGS:  Nothing recent  PATIENT SURVEYS:  FOTO 47  COGNITION: Overall cognitive status: Within functional limits for tasks assessed  SPOSTURE: No Significant postural limitations  PALPATION: Tender to touch with B mm spasm in upper trap with Lt greater than RT   CERVICAL ROM:   Active ROM A/PROM (deg) eval  Flexion 50  Extension 50  Right lateral flexion   Left lateral flexion   Right rotation 53  Left rotation 45   (Blank rows = not tested)  UPPER EXTREMITY ROM: WNL   CERVICAL SPECIAL TESTS:  AROM:  Extension : 50, reps no change                Flexion : 50, reps no change                Rt rotation: 53               Lt rotation: 45 Isometric mm testing: RT SB: 4- Lt SB :4 Extension:  5    TODAY'S TREATMENT:                                                                                                                              DATE: 11/19/22 Cervical excursion x 3 Thoracic excursion x 3 Upper trap stretch x 3 for 30" X to V x10 W back 2# x 10  Retro circles with shoulders x 10  Postural strengthening with green theraband   Rows, scapular retraction and shoulder extension x 15   Paloff x 10   Shoulder adduction x 10  UBE backward x 5 minutes level 2  Manual to decrease spasms 11/17/22 UBE 4 minutes backward level 1 Standing GTB scap retraction, shoulder ext, rows 2X10 each Seated trap stretch 3X30"  Levator stretch 3X30" Manual to Lt upper trap to reduce mm spasm Shown how to use cane handle to relieve spasms (given info on theracane)  11/12/22 Cervical excursion x 3 Thoracic excursion x 3 Thoracic mob with bolster x 5  Upper trap stretch  x 3 for 30" X to V 2# x 5 , repeat 5X without wt.  W  back 2# x 10  Retro circles with shoulders x 10  Punch downs with green theraband x 10  Postural strengthening with green theraband   Rows, scapular retraction and shoulder extension x 15 Manual to decrease spasms   11/08/22 Standing: Wall pushup x 10 Postural thera -band exercises.  Scapular retraction x 10 green Rows x 10 green Shoulder extension x 10  green Sitting:  Cervical excursions x 10 Thoracic excursions x 10 Thoracic extension over roll x 10 X to V x 10  W back 1# x 10 UBE backward x 10     11/04/22 Manual completed to include jt mobilization, traction and massage to improve motion Isometric SB, extension x 10 Sitting: cervical and thoracic excursion x 3 Retro shoulder rolls x 10 Scapular and cervical retraction x 10 combined Trap stretch  UBE backward x 4 minutes   PATIENT EDUCATION:  Education details: HEP Person educated: Patient Education method: Programmer, multimedia, Verbal cues, and Handouts Education comprehension: returned demonstration  HOME EXERCISE PROGRAM: Access Code: [email or web address] URL: [email or web address] Date: 10/25/2022 Prepared by: Virgina Organ Exercises - Seated Upper Trapezius Stretch  - 3 x daily - 7 x weekly - 1 sets - 3 reps - 30" hold - Seated Isometric Cervical Sidebending  - 3 x daily - 7 x weekly - 1 sets - 5 reps - 5" hold  10/26/22 Cervical excursions 5X each Thoracic excursions with UE movements 5X each   11/08/22 - Seated Scapular Retraction with External Rotation  - 3 x daily - 7 x weekly - 1 sets - 10 reps - 3-5" hold - Seated Isometric Cervical Extension  - 3 x daily - 7 x weekly - 1 sets - 10 reps - 3-5" hold - Wall Push Up  - 3 x daily - 7 x weekly - 1 sets - 10 reps - 3-5" hold            11/12/22  Scapular depression with green theraband.  11/17/2022 - Gentle Levator Scapulae Stretch  - 3 x daily - 7 x weekly - 1 sets - 3 reps - 30 sec hold 11/19/22 - Squatting Anti-Rotation Press  - 1 x daily - 7 x weekly - 1  sets - 10 reps - 3-5" hold - Standing Shoulder Adduction Reactive Isometrics with Resistance and Elbow Extended  - 1 x daily - 7 x weekly - 1 sets - 10 reps - 3-5" hold ASSESSMENT:  CLINICAL IMPRESSION:  Added resisted shoulder adduction as well as paloff to pt HEP.  Pt notes improved thoracic mobility.  Pt continues to have multiple spasms which are reduced with manual techniques.    Pt able to complete all exercises with good technique after instruction. PT will continue to benefit from skilled therapy to maximize her function and decrease her pain.   OBJECTIVE IMPAIRMENTS: decreased ROM, decreased strength, increased muscle spasms, and pain.    PERSONAL FACTORS: Past/current experiences, Time since onset of injury/illness/exacerbation, and 1 comorbidity: OA  are also affecting patient's functional outcome.   REHAB POTENTIAL: Good  CLINICAL DECISION MAKING: Stable/uncomplicated  EVALUATION COMPLEXITY: Low   GOALS: Goals reviewed with patient? No  SHORT TERM GOALS: Target date: 11/08/2022  PT to be Independent in HEP in order  to improve her cervical rotation to 60 degrees for improved ROM for driving/ talking to people sitting beside her. Baseline:  Goal status: IN PROGRESS  LONG TERM GOALS: Target date: 7/30/243  PT to be Independent in HEP in order  to improve cervical rotation to 65 degrees B  Baseline:  Goal status: IN PROGRESS  2.  Pt to have not had any instance of excruciating pain in her neck.  Baseline:  Goal status: IN PROGRESS  3.  PT SB mm strength to be at least 4+/5 to demonstrate improved cervical stability. Baseline:  Goal status: IN PROGRESS  4.  Pt to state that she is no longer tender to the touch  Baseline:  Goal status: IN PROGRESS     PLAN:  PT FREQUENCY: 2x/week  PT DURATION: 4 weeks  PLANNED INTERVENTIONS: Therapeutic exercises, Patient/Family education, Self Care, Joint mobilization, and Manual therapy  PLAN FOR NEXT SESSION:   reassess next treatment for probable discharge.   Virgina Organ, PT CLT [phone]   11/19/2022, 3:15 PM

## 2022-11-23 ENCOUNTER — Ambulatory Visit (HOSPITAL_COMMUNITY): Payer: Medicare Other | Admitting: Physical Therapy

## 2022-11-23 DIAGNOSIS — M6281 Muscle weakness (generalized): Secondary | ICD-10-CM

## 2022-11-23 DIAGNOSIS — M542 Cervicalgia: Secondary | ICD-10-CM | POA: Diagnosis not present

## 2022-11-23 DIAGNOSIS — G8929 Other chronic pain: Secondary | ICD-10-CM | POA: Diagnosis not present

## 2022-11-23 DIAGNOSIS — M546 Pain in thoracic spine: Secondary | ICD-10-CM | POA: Diagnosis not present

## 2022-11-23 DIAGNOSIS — R262 Difficulty in walking, not elsewhere classified: Secondary | ICD-10-CM | POA: Diagnosis not present

## 2022-11-23 NOTE — Therapy (Addendum)
OUTPATIENT PHYSICAL THERAPY TREATMENT/discharge   Patient Name: Adrienne [name] MRN: [id number] DOB:Sep 28, 1945, 77 y.o., female Today's Date: 11/23/2022 PHYSICAL THERAPY DISCHARGE SUMMARY  Visits from Start of Care: 7  Current functional level related to goals / functional outcomes: No more pain    Remaining deficits: none   Education / Equipment: HEP   Patient agrees to discharge. Patient goals were met. Patient is being discharged due to meeting the stated rehab goals.   END OF SESSION:   PT End of Session - 11/19/22 1515    Visit Number 7    Number of Visits 7    Date for PT Re-Evaluation 11/24/22    Authorization Type Medicare    Progress Note Due on Visit 8    PT Start Time 1435    PT Stop Time 1515    PT Time Calculation (min) 40 min    Activity Tolerance Patient tolerated treatment well    Behavior During Therapy [facility] for tasks assessed/performed               Past Medical History:  Diagnosis Date   Allergy    Arthritis    Cataract    Diverticulosis 05/2009   GERD (gastroesophageal reflux disease)    Glaucoma    Hyperlipidemia    Hypertension    Past Surgical History:  Procedure Laterality Date   ABDOMINAL HYSTERECTOMY  04/26/1988   for fibroids ovaries remain    CHOLECYSTECTOMY  04/26/1978   COLONOSCOPY  05/27/2009   rare diverticula, small internal hemorrhoids. Next colonoscopy in 5-10 yrs.   COLONOSCOPY N/A 10/23/2018   Procedure: COLONOSCOPY;  Surgeon: West Bali, MD;  [facility];  Service: Endoscopy;  Laterality: N/A;  2:00pm   JOINT REPLACEMENT N/A    Phreesia 06/02/2020   left nipple inversion bx benign  04/26/1998   POLYPECTOMY  10/23/2018   Procedure: POLYPECTOMY;  Surgeon: West Bali, MD;  Location: [facility];  Service: Endoscopy;;   TOTAL HIP ARTHROPLASTY Right 05/16/2020   Procedure: TOTAL HIP ARTHROPLASTY;  Surgeon: Frederico Hamman, MD;  Location: [facility];  Service: Orthopedics;  Laterality: Right;    Patient Active Problem List   Diagnosis Date Noted   Large breasts 09/12/2022   Neck pain 09/12/2022   Thoracic back pain 09/12/2022   COVID-19 03/22/2022   Fatigue due to excessive exertion 03/12/2022   Skin tags, multiple acquired 08/05/2021   Epidermoid cyst of skin of scalp 08/05/2021   Headache 08/05/2021   Osteopenia 02/03/2021   Acquired skin tag 10/19/2020   Leg swelling 03/31/2020   Venous insufficiency of both lower extremities 03/31/2020   Vitamin D deficiency 03/06/2020   Prediabetes 03/06/2020   Knee pain, right 04/06/2019   Left ankle sprain 04/06/2019   Personal history of colonic polyps 07/12/2018   Ingrown toenail of both feet 03/23/2017   Hip pain, chronic, right 02/03/2016   Neck pain on left side 02/24/2015   GERD (gastroesophageal reflux disease) 12/26/2014   Diverticulosis of colon without hemorrhage 07/05/2014   FH: pancreatic cancer 01/26/2014   FH: glaucoma 03/24/2013   Overweight (BMI 25.0-29.9) 03/13/2012   Hyperlipidemia LDL goal <100 04/03/2009   Essential hypertension 04/03/2009    PCP: Syliva Overman  REFERRING PROVIDER: Kerri Perches, MD  REFERRING DIAG:  Diagnosis  M54.2 (ICD-10-CM) - Neck pain on left side  M54.6,G89.29 (ICD-10-CM) - Chronic bilateral thoracic back pain    THERAPY DIAG:  Cervicalgia  Muscle weakness (generalized)  Rationale for Evaluation and Treatment: Rehabilitation  ONSET DATE: chronic noted 2016 with acute exacerbation in the past 6 months.                                                                                                                                                                                                          SUBJECTIVE STATEMENT:pt states she is no longer having the painful episodes. Feels she is ready for discharge.   Evaluation: Pt states that the excruciating pain comes and goes and she is not really sure what causes it.  It has happened twice.  She states that she  uses ice which helps and she is tender to the touch.  The pain is on her Left side and occasionally will go down into her shoulder   Hand dominance: Right  PERTINENT HISTORY:  OA with cervical,back, hip and knee pain   PAIN:  Are you having pain? No Pain location: Lt cervical Pain description: tender to touch  Aggravating factors: not sure Relieving factors: ice  PRECAUTIONS: None  WEIGHT BEARING RESTRICTIONS: No  FALLS:  Has patient fallen in last 6 months? No  PLOF: Independent  PATIENT GOALS: to not be tender to the touch, self care   NEXT MD VISIT: November 2024  OBJECTIVE:   DIAGNOSTIC FINDINGS:  Nothing recent  PATIENT SURVEYS:  FOTO 47  COGNITION: Overall cognitive status: Within functional limits for tasks assessed  SPOSTURE: No Significant postural limitations  PALPATION: Tender to touch with B mm spasm in upper trap with Lt greater than RT   CERVICAL ROM:   Active ROM A/PROM (deg) eval AROM 11/23/22  Flexion 50 WNL  Extension 50 WNL  Right lateral flexion  WNL  Left lateral flexion  WNL  Right rotation 53 WNL  Left rotation 45 WNL   (Blank rows = not tested)  UPPER EXTREMITY ROM: WNL   CERVICAL SPECIAL TESTS:  AROM:  Extension : 50, reps no change                Flexion : 50, reps no change                Rt rotation: 53               Lt rotation: 45 Isometric mm testing: RT SB: 4- Lt SB :4 Extension:  5    TODAY'S TREATMENT:  DATE: PROGRESS NOTE AROM:  see above Isometric mm testing: RT SB: 5(4-at evaluation) Lt SB : 5(4 at evaluation) Extension:  5 (5 at evaluation) Goal review  11/19/22 Cervical excursion x 3 Thoracic excursion x 3 Upper trap stretch x 3 for 30" X to V x10 W back 2# x 10  Retro circles with shoulders x 10  Postural strengthening with green theraband   Rows, scapular retraction  and shoulder extension x 15   Paloff x 10   Shoulder adduction x 10  UBE backward x 5 minutes level 2  Manual to decrease spasms 11/17/22 UBE 4 minutes backward level 1 Standing GTB scap retraction, shoulder ext, rows 2X10 each Seated trap stretch 3X30"  Levator stretch 3X30" Manual to Lt upper trap to reduce mm spasm Shown how to use cane handle to relieve spasms (given info on theracane)  11/12/22 Cervical excursion x 3 Thoracic excursion x 3 Thoracic mob with bolster x 5  Upper trap stretch x 3 for 30" X to V 2# x 5 , repeat 5X without wt.  W back 2# x 10  Retro circles with shoulders x 10  Punch downs with green theraband x 10  Postural strengthening with green theraband   Rows, scapular retraction and shoulder extension x 15 Manual to decrease spasms   11/08/22 Standing: Wall pushup x 10 Postural thera -band exercises.  Scapular retraction x 10 green Rows x 10 green Shoulder extension x 10  green Sitting:  Cervical excursions x 10 Thoracic excursions x 10 Thoracic extension over roll x 10 X to V x 10  W back 1# x 10 UBE backward x 10     11/04/22 Manual completed to include jt mobilization, traction and massage to improve motion Isometric SB, extension x 10 Sitting: cervical and thoracic excursion x 3 Retro shoulder rolls x 10 Scapular and cervical retraction x 10 combined Trap stretch  UBE backward x 4 minutes   PATIENT EDUCATION:  Education details: HEP Person educated: Patient Education method: Programmer, multimedia, Verbal cues, and Handouts Education comprehension: returned demonstration  HOME EXERCISE PROGRAM: Access Code: [email or web address] URL: [email or web address] Date: 10/25/2022 Prepared by: Virgina Organ Exercises - Seated Upper Trapezius Stretch  - 3 x daily - 7 x weekly - 1 sets - 3 reps - 30" hold - Seated Isometric Cervical Sidebending  - 3 x daily - 7 x weekly - 1 sets - 5 reps - 5" hold  10/26/22 Cervical excursions 5X each Thoracic  excursions with UE movements 5X each   11/08/22 - Seated Scapular Retraction with External Rotation  - 3 x daily - 7 x weekly - 1 sets - 10 reps - 3-5" hold - Seated Isometric Cervical Extension  - 3 x daily - 7 x weekly - 1 sets - 10 reps - 3-5" hold - Wall Push Up  - 3 x daily - 7 x weekly - 1 sets - 10 reps - 3-5" hold            11/12/22  Scapular depression with green theraband.  11/17/2022 - Gentle Levator Scapulae Stretch  - 3 x daily - 7 x weekly - 1 sets - 3 reps - 30 sec hold 11/19/22 - Squatting Anti-Rotation Press  - 1 x daily - 7 x weekly - 1 sets - 10 reps - 3-5" hold - Standing Shoulder Adduction Reactive Isometrics with Resistance and Elbow Extended  - 1 x daily - 7 x weekly - 1 sets - 10 reps - 3-5" hold ASSESSMENT:  CLINICAL IMPRESSION:   Reassessment completed this session.  Patient is now painfree with functional ROM and strength WNL at this time.  Pt has met all goals and is ready for discharge. 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  Pt given massage therapist provider list.   OBJECTIVE IMPAIRMENTS: decreased ROM, decreased strength, increased muscle spasms, and pain.    PERSONAL FACTORS: Past/current experiences, Time since onset of injury/illness/exacerbation, and 1 comorbidity: OA  are also affecting patient's functional outcome.   REHAB POTENTIAL: Good  CLINICAL DECISION MAKING: Stable/uncomplicated  EVALUATION COMPLEXITY: Low   GOALS: Goals reviewed with patient? No  SHORT TERM GOALS: Target date: 11/08/2022  PT to be Independent in HEP in order  to improve her cervical rotation to 60 degrees for improved ROM for driving/ talking to people sitting beside her. Baseline:  Goal status: MET    LONG TERM GOALS: Target date: 7/30/243  PT to be Independent in HEP in order  to improve cervical rotation to 65 degrees B  Baseline:  Goal status: MET  2.  Pt to have not had any instance of excruciating pain  in her neck.  Baseline:  Goal status: MET  3.  PT SB mm strength to be at least 4+/5 to demonstrate improved cervical stability. Baseline:  Goal status: MET  4.  Pt to state that she is no longer tender to the touch  Baseline:  Goal status: MET (some residual tenderness from manual)     PLAN:  PT FREQUENCY: 2x/week  PT DURATION: 4 weeks  PLANNED INTERVENTIONS: Therapeutic exercises, Patient/Family education, Self Care, Joint mobilization, and Manual therapy  PLAN FOR NEXT SESSION:  Discharge   Lurena Nida, PTA/CLT [facility] Ph: [phone] Virgina Organ, PT CLT [phone]

## 2022-11-25 ENCOUNTER — Encounter (HOSPITAL_COMMUNITY): Payer: Medicare Other | Admitting: Physical Therapy

## 2022-12-14 ENCOUNTER — Encounter: Payer: Self-pay | Admitting: Plastic Surgery

## 2022-12-14 ENCOUNTER — Ambulatory Visit (INDEPENDENT_AMBULATORY_CARE_PROVIDER_SITE_OTHER): Payer: Medicare Other | Admitting: Plastic Surgery

## 2022-12-14 VITALS — BP 127/77 | HR 71 | Ht 65.5 in | Wt 158.6 lb

## 2022-12-14 DIAGNOSIS — Z803 Family history of malignant neoplasm of breast: Secondary | ICD-10-CM

## 2022-12-14 DIAGNOSIS — R21 Rash and other nonspecific skin eruption: Secondary | ICD-10-CM

## 2022-12-14 DIAGNOSIS — M545 Low back pain, unspecified: Secondary | ICD-10-CM | POA: Diagnosis not present

## 2022-12-14 DIAGNOSIS — M546 Pain in thoracic spine: Secondary | ICD-10-CM | POA: Diagnosis not present

## 2022-12-14 DIAGNOSIS — N62 Hypertrophy of breast: Secondary | ICD-10-CM | POA: Diagnosis not present

## 2022-12-14 DIAGNOSIS — M542 Cervicalgia: Secondary | ICD-10-CM

## 2022-12-14 DIAGNOSIS — I10 Essential (primary) hypertension: Secondary | ICD-10-CM

## 2022-12-14 DIAGNOSIS — Z6826 Body mass index (BMI) 26.0-26.9, adult: Secondary | ICD-10-CM

## 2022-12-14 DIAGNOSIS — G8929 Other chronic pain: Secondary | ICD-10-CM

## 2022-12-14 DIAGNOSIS — G4486 Cervicogenic headache: Secondary | ICD-10-CM

## 2022-12-14 NOTE — Progress Notes (Signed)
Patient ID: Adrienne [name], female    DOB: 1945/11/09, 77 y.o.   MRN: [id number]   Chief Complaint  Patient presents with   Advice Only   Breast Problem    Mammary Hyperplasia: The patient is a 77 y.o. female with a history of mammary hyperplasia for several years.  She has extremely large breasts causing symptoms that include the following: Back pain in the upper and lower back, including neck pain. She pulls or pins her bra straps to provide better lift and relief of the pressure and pain. She notices relief by holding her breast up manually.  Her shoulder straps cause grooves and pain and pressure that requires padding for relief. Pain medication is sometimes required with motrin and tylenol.  Activities that are hindered by enlarged breasts include: exercise and running.  She has tried supportive clothing as well as fitted bras without improvement.  Her breasts are extremely large and fairly symmetric with the left slightly longer.  She has hyperpigmentation of the inframammary area on both sides.  The sternal to nipple distance on the right is 31 cm and the left is 33 cm.  The IMF distance is 17 cm.  She is 5 feet 5 inches tall and weighs 158 pounds.  The BMI = 26.3 kg/m.  Preoperative bra size = G cup. She would like to be a C/D cup. The estimated excess breast tissue to be removed at the time of surgery = 450 grams on the left and 450 grams on the right.  Mammogram history: April 2024 negative.  Family history of breast cancer:  sister and maternal aunt.  Tobacco use:  none.   The patient expresses the desire to pursue surgical intervention. Past surgical history includes hysterectomy and hip surgery.   Review of Systems  Constitutional: Negative.   HENT: Negative.    Eyes: Negative.   Respiratory: Negative.  Negative for chest tightness and shortness of breath.   Cardiovascular: Negative.   Gastrointestinal: Negative.   Endocrine: Negative.   Genitourinary: Negative.    Musculoskeletal:  Positive for back pain and neck pain.  Skin:  Positive for rash.    Past Medical History:  Diagnosis Date   Allergy    Arthritis    Cataract    Diverticulosis 05/2009   GERD (gastroesophageal reflux disease)    Glaucoma    Hyperlipidemia    Hypertension     Past Surgical History:  Procedure Laterality Date   ABDOMINAL HYSTERECTOMY  04/26/1988   for fibroids ovaries remain    CHOLECYSTECTOMY  04/26/1978   COLONOSCOPY  05/27/2009   rare diverticula, small internal hemorrhoids. Next colonoscopy in 5-10 yrs.   COLONOSCOPY N/A 10/23/2018   Procedure: COLONOSCOPY;  Surgeon: West Bali, MD;  [facility];  Service: Endoscopy;  Laterality: N/A;  2:00pm   JOINT REPLACEMENT N/A    Phreesia 06/02/2020   left nipple inversion bx benign  04/26/1998   POLYPECTOMY  10/23/2018   Procedure: POLYPECTOMY;  Surgeon: West Bali, MD;  [facility];  Service: Endoscopy;;   TOTAL HIP ARTHROPLASTY Right 05/16/2020   Procedure: TOTAL HIP ARTHROPLASTY;  Surgeon: Frederico Hamman, MD;  Location: [facility];  Service: Orthopedics;  Laterality: Right;      Current Outpatient Medications:    acetaminophen (TYLENOL) 650 MG CR tablet, Take 2 tablets (1,300 mg total) by mouth daily as needed for pain., Disp: 100 tablet, Rfl: 0   amLODipine (NORVASC) 2.5 MG tablet, TAKE 1 TABLET (  2.5 MG TOTAL) BY MOUTH DAILY., Disp: 90 tablet, Rfl: 3   Biotin 10 MG CAPS, Take 10 mg by mouth every evening., Disp: , Rfl:    guaiFENesin (MUCINEX) 600 MG 12 hr tablet, Take 1 tablet (600 mg total) by mouth 2 (two) times daily as needed., Disp: 24 tablet, Rfl: 0   Latanoprost 0.005 % EMUL, Apply to eye., Disp: , Rfl:    Lidocaine-Menthol (ICY HOT LIDOCAINE PLUS MENTHOL EX), Apply 1 application. topically 4 (four) times daily as needed (pain.). Icy Hot Cream with Lidocaine plus Menthol Cream, Disp: , Rfl:    Liniments (BLUE-EMU SUPER STRENGTH EX), Apply 1 application. topically 4 (four)  times daily as needed (pain.)., Disp: , Rfl:    Liniments (PAIN RELIEF EX), Apply 1 application. topically 4 (four) times daily as needed (pain.). Triderma Pain Relief Cream, Disp: , Rfl:    loratadine (CLARITIN) 10 MG tablet, Take 10 mg by mouth daily as needed for allergies., Disp: , Rfl:    Menthol, Topical Analgesic, (ICE BLUE EX), Apply 1 application. topically 4 (four) times daily as needed (pain.)., Disp: , Rfl:    Menthol-Methyl Salicylate (MUSCLE RUB) 10-15 % CREA, Apply 1 application. topically as needed for muscle pain., Disp: , Rfl:    Multiple Minerals-Vitamins (CAL MAG ZINC +D3 PO), Take 1 tablet by mouth every evening., Disp: , Rfl:    Multiple Vitamin (MULTIVITAMIN WITH MINERALS) TABS tablet, Take 1 tablet by mouth every evening. Women's Multivitamin 50+, Disp: , Rfl:    triamterene-hydrochlorothiazide (MAXZIDE-25) 37.5-25 MG tablet, TAKE 1 TABLET EVERY DAY, Disp: 90 tablet, Rfl: 3   TURMERIC PO, Take 450 mg by mouth every evening., Disp: , Rfl:    Objective:   Vitals:   12/14/22 1352  BP: 127/77  Pulse: 71  SpO2: 96%    Physical Exam Vitals and nursing note reviewed.  Constitutional:      Appearance: Normal appearance.  HENT:     Head: Normocephalic and atraumatic.  Cardiovascular:     Rate and Rhythm: Normal rate.     Pulses: Normal pulses.  Pulmonary:     Effort: Pulmonary effort is normal.  Abdominal:     Palpations: Abdomen is soft.  Musculoskeletal:        General: No swelling or deformity.  Skin:    General: Skin is warm.     Capillary Refill: Capillary refill takes less than 2 seconds.     Coloration: Skin is not jaundiced.     Findings: No bruising or lesion.  Neurological:     Mental Status: She is alert and oriented to person, place, and time.  Psychiatric:        Mood and Affect: Mood normal.        Behavior: Behavior normal.        Thought Content: Thought content normal.        Judgment: Judgment normal.     Assessment & Plan:  Essential  hypertension  Neck pain on left side  Hip pain, chronic, right  Chronic pain of right knee  Cervicogenic headache  Chronic bilateral thoracic back pain  Large breasts  Neck pain  The procedure the patient selected and that was best for the patient was discussed. The risk were discussed and include but not limited to the following:  Breast asymmetry, fluid accumulation, firmness of the breast, inability to breast feed, loss of nipple or areola, skin loss, change in skin and nipple sensation, fat necrosis of the breast tissue, bleeding, infection  and healing delay.  There are risks of anesthesia and injury to nerves or blood vessels.  Allergic reaction to tape, suture and skin glue are possible.  There will be swelling.  Any of these can lead to the need for revisional surgery which is not included in this surgery.  A breast reduction has potential to interfere with diagnostic procedures in the future.  This procedure is best done when the breast is fully developed.  Changes in the breast will continue to occur over time: pregnancy, weight gain or weigh loss. No guarantees are given for a certain bra or breast size.    Total time: 40 minutes. This includes time spent with the patient during the visit as well as time spent before and after the visit reviewing the chart, documenting the encounter, ordering pertinent studies and literature for the patient.   Physical therapy:  already completed   The patient is a good candidate for bilateral breast reduction with liposuction.  Pictures were obtained of the patient and placed in the chart with the patient's or guardian's permission.   Alena Bills Sherryll Skoczylas, DO

## 2023-02-03 ENCOUNTER — Other Ambulatory Visit: Payer: Self-pay | Admitting: Family Medicine

## 2023-03-16 ENCOUNTER — Ambulatory Visit: Payer: Medicare Other | Admitting: Family Medicine

## 2023-03-28 DIAGNOSIS — H401131 Primary open-angle glaucoma, bilateral, mild stage: Secondary | ICD-10-CM | POA: Diagnosis not present

## 2023-04-12 DIAGNOSIS — I10 Essential (primary) hypertension: Secondary | ICD-10-CM | POA: Diagnosis not present

## 2023-04-12 DIAGNOSIS — R7303 Prediabetes: Secondary | ICD-10-CM | POA: Diagnosis not present

## 2023-04-12 DIAGNOSIS — E785 Hyperlipidemia, unspecified: Secondary | ICD-10-CM | POA: Diagnosis not present

## 2023-04-12 DIAGNOSIS — E559 Vitamin D deficiency, unspecified: Secondary | ICD-10-CM | POA: Diagnosis not present

## 2023-04-13 ENCOUNTER — Ambulatory Visit: Payer: Medicare Other | Admitting: Surgical

## 2023-04-13 ENCOUNTER — Encounter: Payer: Self-pay | Admitting: Surgical

## 2023-04-13 VITALS — BP 117/74 | HR 67

## 2023-04-13 DIAGNOSIS — N62 Hypertrophy of breast: Secondary | ICD-10-CM

## 2023-04-13 DIAGNOSIS — G8929 Other chronic pain: Secondary | ICD-10-CM

## 2023-04-13 DIAGNOSIS — M546 Pain in thoracic spine: Secondary | ICD-10-CM

## 2023-04-13 MED ORDER — ONDANSETRON HCL 4 MG PO TABS: 4.0000 mg | ORAL_TABLET | Freq: Three times a day (TID) | ORAL | 0 refills | Status: AC | PRN

## 2023-04-13 MED ORDER — CEPHALEXIN 500 MG PO CAPS: 500.0000 mg | ORAL_CAPSULE | Freq: Four times a day (QID) | ORAL | 0 refills | Status: AC

## 2023-04-13 MED ORDER — IBUPROFEN 600 MG PO TABS: 600.0000 mg | ORAL_TABLET | Freq: Four times a day (QID) | ORAL | 0 refills | Status: DC | PRN

## 2023-04-13 MED ORDER — OXYCODONE HCL 5 MG PO TABS: 5.0000 mg | ORAL_TABLET | Freq: Four times a day (QID) | ORAL | 0 refills | Status: AC | PRN

## 2023-04-13 NOTE — Progress Notes (Signed)
Patient ID: Adrienne [name], female    DOB: 1945/08/01, 77 y.o.   MRN: [id number]  Chief Complaint  Patient presents with   Pre-op Exam      ICD-10-CM   1. Large breasts  N62     2. Chronic bilateral thoracic back pain  M54.6    G89.29       History of Present Illness: Adrienne [name] is a 77 y.o.  female  with a history of macromastia.  She presents for preoperative evaluation for upcoming procedure, Bilateral Breast Reduction with possible liposuction, scheduled for 05/11/2023 with Dr.  Ulice Bold  The patient has not had problems with anesthesia. No history of DVT/PE.  No family history of DVT/PE.  No family or personal history of bleeding or clotting disorders.  Patient is not currently taking any blood thinners.  No history of CVA/MI.   Summary of Previous Visit: STN is 31 cm on the right, 33 cm on the left.  She is a G cup.  She would like to be C/D.  Had a mammogram in April 2024 which was negative.  Does not use tobacco.  Has had hysterectomy and hip surgery.  Estimated excess breast tissue to be removed at time of surgery: 450 grams  Job: Retired  PMH Significant for: Hypertension, hyperlipidemia, GERD, arthritis, venous insufficiency of bilateral lower extremities  Of note, patient had labs yesterday with normal CBC, hemoglobin A1c 5.8, normal CMP.  Patient reports she has been feeling well lately, no recent significant changes to her health.  She denies any cardiac or pulmonary disease.  Past Medical History: Allergies: Allergies  Allergen Reactions   Valsartan Shortness Of Breath   Ace Inhibitors Cough    Current Medications:  Current Outpatient Medications:    BOOSTRIX 5-2.5-18.5 LF-MCG/0.5 injection, , Disp: , Rfl:    cephALEXin (KEFLEX) 500 MG capsule, Take 1 capsule (500 mg total) by mouth 4 (four) times daily for 3 days., Disp: 12 capsule, Rfl: 0   dorzolamide-timolol (COSOPT) 2-0.5 % ophthalmic solution, 1 drop 2 (two) times daily., Disp: , Rfl:     ibuprofen (ADVIL) 600 MG tablet, Take 1 tablet (600 mg total) by mouth every 6 (six) hours as needed for moderate pain (pain score 4-6). For use AFTER surgery, Disp: 30 tablet, Rfl: 0   ondansetron (ZOFRAN) 4 MG tablet, Take 1 tablet (4 mg total) by mouth every 8 (eight) hours as needed for nausea or vomiting., Disp: 20 tablet, Rfl: 0   oxyCODONE (OXY IR/ROXICODONE) 5 MG immediate release tablet, Take 1 tablet (5 mg total) by mouth every 6 (six) hours as needed for up to 5 days for severe pain (pain score 7-10)., Disp: 20 tablet, Rfl: 0   acetaminophen (TYLENOL) 650 MG CR tablet, Take 2 tablets (1,300 mg total) by mouth daily as needed for pain., Disp: 100 tablet, Rfl: 0   amLODipine (NORVASC) 2.5 MG tablet, TAKE 1 TABLET EVERY DAY, Disp: 90 tablet, Rfl: 3   Biotin 10 MG CAPS, Take 10 mg by mouth every evening., Disp: , Rfl:    guaiFENesin (MUCINEX) 600 MG 12 hr tablet, Take 1 tablet (600 mg total) by mouth 2 (two) times daily as needed., Disp: 24 tablet, Rfl: 0   Lidocaine-Menthol (ICY HOT LIDOCAINE PLUS MENTHOL EX), Apply 1 application. topically 4 (four) times daily as needed (pain.). Icy Hot Cream with Lidocaine plus Menthol Cream, Disp: , Rfl:    Liniments (BLUE-EMU SUPER STRENGTH EX), Apply 1 application. topically 4 (four) times daily  as needed (pain.)., Disp: , Rfl:    Liniments (PAIN RELIEF EX), Apply 1 application. topically 4 (four) times daily as needed (pain.). Triderma Pain Relief Cream, Disp: , Rfl:    loratadine (CLARITIN) 10 MG tablet, Take 10 mg by mouth daily as needed for allergies., Disp: , Rfl:    Menthol, Topical Analgesic, (ICE BLUE EX), Apply 1 application. topically 4 (four) times daily as needed (pain.)., Disp: , Rfl:    Menthol-Methyl Salicylate (MUSCLE RUB) 10-15 % CREA, Apply 1 application. topically as needed for muscle pain., Disp: , Rfl:    Multiple Minerals-Vitamins (CAL MAG ZINC +D3 PO), Take 1 tablet by mouth every evening., Disp: , Rfl:    Multiple Vitamin  (MULTIVITAMIN WITH MINERALS) TABS tablet, Take 1 tablet by mouth every evening. Women's Multivitamin 50+, Disp: , Rfl:    triamterene-hydrochlorothiazide (MAXZIDE-25) 37.5-25 MG tablet, TAKE 1 TABLET EVERY DAY, Disp: 90 tablet, Rfl: 3   TURMERIC PO, Take 450 mg by mouth every evening., Disp: , Rfl:   Past Medical Problems: Past Medical History:  Diagnosis Date   Allergy    Arthritis    Cataract    Diverticulosis 05/2009   GERD (gastroesophageal reflux disease)    Glaucoma    Hyperlipidemia    Hypertension     Past Surgical History: Past Surgical History:  Procedure Laterality Date   ABDOMINAL HYSTERECTOMY  04/26/1988   for fibroids ovaries remain    CHOLECYSTECTOMY  04/26/1978   COLONOSCOPY  05/27/2009   rare diverticula, small internal hemorrhoids. Next colonoscopy in 5-10 yrs.   COLONOSCOPY N/A 10/23/2018   Procedure: COLONOSCOPY;  Surgeon: West Bali, MD;  Location: [facility];  Service: Endoscopy;  Laterality: N/A;  2:00pm   JOINT REPLACEMENT N/A    Phreesia 06/02/2020   left nipple inversion bx benign  04/26/1998   POLYPECTOMY  10/23/2018   Procedure: POLYPECTOMY;  Surgeon: West Bali, MD;  Location: [facility];  Service: Endoscopy;;   TOTAL HIP ARTHROPLASTY Right 05/16/2020   Procedure: TOTAL HIP ARTHROPLASTY;  Surgeon: Frederico Hamman, MD;  Location: [facility];  Service: Orthopedics;  Laterality: Right;    Social History: Social History   Socioeconomic History   Marital status: Single    Spouse name: Not on file   Number of children: 0   Years of education: Not on file   Highest education level: Bachelor's degree (e.g., BA, AB, BS)  Occupational History   Occupation: retired    Associate Professor: RETIRED  Tobacco Use   Smoking status: Never   Smokeless tobacco: Never  Vaping Use   Vaping status: Never Used  Substance and Sexual Activity   Alcohol use: Not Currently    Comment: rarely    Drug use: No   Sexual activity: Not Currently    Birth  control/protection: Surgical    Comment: not asked  Other Topics Concern   Not on file  Social History Narrative   Not on file   Social Drivers of Health   Financial Resource Strain: Low Risk  (09/07/2022)   Overall Financial Resource Strain (CARDIA)    Difficulty of Paying Living Expenses: Not hard at all  Food Insecurity: No Food Insecurity (09/07/2022)   Hunger Vital Sign    Worried About Running Out of Food in the Last Year: Never true    Ran Out of Food in the Last Year: Never true  Transportation Needs: No Transportation Needs (09/07/2022)   PRAPARE - Administrator, Civil Service (Medical):  No    Lack of Transportation (Non-Medical): No  Physical Activity: Insufficiently Active (09/07/2022)   Exercise Vital Sign    Days of Exercise per Week: 1 day    Minutes of Exercise per Session: 90 min  Stress: No Stress Concern Present (09/07/2022)   [employer] of Occupational Health - Occupational Stress Questionnaire    Feeling of Stress : Not at all  Social Connections: Moderately Integrated (09/07/2022)   Social Connection and Isolation Panel [NHANES]    Frequency of Communication with Friends and Family: More than three times a week    Frequency of Social Gatherings with Friends and Family: More than three times a week    Attends Religious Services: More than 4 times per year    Active Member of [employer] or Organizations: Yes    Attends Banker Meetings: More than 4 times per year    Marital Status: Never married  Intimate Partner Violence: Not At Risk (07/03/2022)   Humiliation, Afraid, Rape, and Kick questionnaire    Fear of Current or Ex-Partner: No    Emotionally Abused: No    Physically Abused: No    Sexually Abused: No    Family History: Family History  Problem Relation Age of Onset   Kidney failure Father    Arthritis Father    Cancer Father    Heart disease Sister    Hypertension Sister    Arthritis Sister    Breast cancer Sister     Diabetes Sister    Arthritis Sister    Lung cancer Brother        was a smoker   Arthritis Brother    Diabetes Brother    Heart disease Brother    Hypertension Brother    Arthritis Mother    Heart disease Mother    Hypertension Mother    Prostate cancer Brother    Colon cancer Neg Hx     Review of Systems: Review of Systems  Constitutional: Negative.   Respiratory: Negative.    Cardiovascular: Negative.   Gastrointestinal: Negative.   Neurological: Negative.     Physical Exam: Vital Signs BP 117/74 (BP Location: Left Arm, Patient Position: Sitting, Cuff Size: Normal)   Pulse 67   SpO2 98%   Physical Exam  Constitutional:      General: Not in acute distress.    Appearance: Normal appearance. Not ill-appearing.  HENT:     Head: Normocephalic and atraumatic.  Eyes:     Pupils: Pupils are equal, round Neck:     Musculoskeletal: Normal range of motion.  Cardiovascular:     Rate and Rhythm: Normal rate    Pulses: Normal pulses.  Pulmonary:     Effort: Pulmonary effort is normal. No respiratory distress.  Musculoskeletal: Normal range of motion.  Skin:    General: Skin is warm and dry.     Findings: No erythema or rash.  Neurological:     General: No focal deficit present.     Mental Status: Alert and oriented to person, place, and time. Mental status is at baseline.     Motor: No weakness.  Psychiatric:        Mood and Affect: Mood normal.        Behavior: Behavior normal.    Assessment/Plan: The patient is scheduled for bilateral breast reduction with Dr. Ulice Bold.  Risks, benefits, and alternatives of procedure discussed, questions answered and consent obtained.    Smoking Status: Non-smoker; Counseling Given?  N/A Last Mammogram: April 2024;  Results: BI-RADS 1  Caprini Score: 7; Risk Factors include: Age, venous insufficiency, BMI > 25, and length of planned surgery. Recommendation for mechanical and possible pharmacological prophylaxis. Encourage early  ambulation.   Pictures obtained: @consult   Post-op Rx sent to pharmacy: Oxycodone, Zofran, Keflex  Patient was provided with the breast reduction and General Surgical Risk consent document and Pain Medication Agreement prior to their appointment.  They had adequate time to read through the risk consent documents and Pain Medication Agreement. We also discussed them in person together during this preop appointment. All of their questions were answered to their satisfaction.  Recommended calling if they have any further questions.  Risk consent form and Pain Medication Agreement to be scanned into patient's chart.  The risk that can be encountered with breast reduction were discussed and include the following but not limited to these:  Breast asymmetry, fluid accumulation, firmness of the breast, inability to breast feed, loss of nipple or areola, skin loss, decrease or no nipple sensation, fat necrosis of the breast tissue, bleeding, infection, healing delay.  There are risks of anesthesia, changes to skin sensation and injury to nerves or blood vessels.  The muscle can be temporarily or permanently injured.  You may have an allergic reaction to tape, suture, glue, blood products which can result in skin discoloration, swelling, pain, skin lesions, poor healing.  Any of these can lead to the need for revisonal surgery or stage procedures.  A reduction has potential to interfere with diagnostic procedures.  Nipple or breast piercing can increase risks of infection.  This procedure is best done when the breast is fully developed.  Changes in the breast will continue to occur over time.  Pregnancy can alter the outcomes of previous breast reduction surgery, weight gain and weigh loss can also effect the long term appearance.   Patient had questions about surgery, she had questions about the scarring and if she would have scars around the nipple areola.  We discussed that she would have incisions around the  nipple areola, down the front of her breast and within the inframammary fold.  We discussed that she would have scars in this area.  Electronically signed by: Kermit Balo Semone Orlov, PA-C 04/13/2023 1:49 PM

## 2023-04-15 ENCOUNTER — Ambulatory Visit (INDEPENDENT_AMBULATORY_CARE_PROVIDER_SITE_OTHER): Payer: Medicare Other | Admitting: Family Medicine

## 2023-04-15 VITALS — BP 111/75 | HR 63 | Ht 65.0 in | Wt 159.0 lb

## 2023-04-15 DIAGNOSIS — E785 Hyperlipidemia, unspecified: Secondary | ICD-10-CM | POA: Diagnosis not present

## 2023-04-15 DIAGNOSIS — D126 Benign neoplasm of colon, unspecified: Secondary | ICD-10-CM

## 2023-04-15 DIAGNOSIS — Z23 Encounter for immunization: Secondary | ICD-10-CM

## 2023-04-15 DIAGNOSIS — I10 Essential (primary) hypertension: Secondary | ICD-10-CM

## 2023-04-15 DIAGNOSIS — E559 Vitamin D deficiency, unspecified: Secondary | ICD-10-CM

## 2023-04-15 DIAGNOSIS — N62 Hypertrophy of breast: Secondary | ICD-10-CM

## 2023-04-15 DIAGNOSIS — E663 Overweight: Secondary | ICD-10-CM | POA: Diagnosis not present

## 2023-04-15 DIAGNOSIS — R7303 Prediabetes: Secondary | ICD-10-CM | POA: Diagnosis not present

## 2023-04-15 LAB — CBC
Hematocrit: 40.2 % (ref 34.0–46.6)
Hemoglobin: 13.3 g/dL (ref 11.1–15.9)
MCH: 29.8 pg (ref 26.6–33.0)
MCHC: 33.1 g/dL (ref 31.5–35.7)
MCV: 90 fL (ref 79–97)
Platelets: 273 10*3/uL (ref 150–450)
RBC: 4.47 x10E6/uL (ref 3.77–5.28)
RDW: 13 % (ref 11.7–15.4)
WBC: 7.2 10*3/uL (ref 3.4–10.8)

## 2023-04-15 LAB — CMP14+EGFR
ALT: 17 [IU]/L (ref 0–32)
AST: 21 [IU]/L (ref 0–40)
Albumin: 4.1 g/dL (ref 3.8–4.8)
Alkaline Phosphatase: 108 [IU]/L (ref 44–121)
BUN/Creatinine Ratio: 18 (ref 12–28)
BUN: 16 mg/dL (ref 8–27)
Bilirubin Total: 0.4 mg/dL (ref 0.0–1.2)
CO2: 26 mmol/L (ref 20–29)
Calcium: 9.7 mg/dL (ref 8.7–10.3)
Chloride: 101 mmol/L (ref 96–106)
Creatinine, Ser: 0.88 mg/dL (ref 0.57–1.00)
Globulin, Total: 2.9 g/dL (ref 1.5–4.5)
Glucose: 77 mg/dL (ref 70–99)
Potassium: 4 mmol/L (ref 3.5–5.2)
Sodium: 143 mmol/L (ref 134–144)
Total Protein: 7 g/dL (ref 6.0–8.5)
eGFR: 68 mL/min/{1.73_m2} (ref >59)

## 2023-04-15 LAB — LIPID PANEL
Chol/HDL Ratio: 2.5 {ratio} (ref 0.0–4.4)
Cholesterol, Total: 278 mg/dL — ABNORMAL HIGH (ref 100–199)
HDL: 111 mg/dL (ref >39)
LDL Chol Calc (NIH): 155 mg/dL — ABNORMAL HIGH (ref 0–99)
Triglycerides: 75 mg/dL (ref 0–149)
VLDL Cholesterol Cal: 12 mg/dL (ref 5–40)

## 2023-04-15 LAB — HEMOGLOBIN A1C
Est. average glucose Bld gHb Est-mCnc: 120 mg/dL
Hgb A1c MFr Bld: 5.8 % — ABNORMAL HIGH (ref 4.8–5.6)

## 2023-04-15 LAB — TSH: TSH: 1.59 u[IU]/mL (ref 0.450–4.500)

## 2023-04-15 LAB — VITAMIN D 25 HYDROXY (VIT D DEFICIENCY, FRACTURES): Vit D, 25-Hydroxy: 48.7 ng/mL (ref 30.0–100.0)

## 2023-04-15 NOTE — Patient Instructions (Addendum)
F/u in 6 months, call if you need me sooner  Flu vaccine in office today  You are being referred for nutrition education and will benefit greatly because you want to learn and you want to change for the better  You need these vaccines Pneumonia  20 RSV Covid Shingrix #2  Congrats, tG have reduced by 50%! Cholesterol is still high and you are pre diabetic   Fasing lipid, cmp and eGGR, hBA1c, cBC, TSH and vit D 3 to 5 days before 6 month follow up visit  You are being referred to GI to discuss colon cancer screening options and decide on what's best based on your history and your age  It is important that you exercise regularly at least 30 minutes 5 times a week. If you develop chest pain, have severe difficulty breathing, or feel very tired, stop exercising immediately and seek medical attention   Best for 2025!  Thanks for choosing [facility], we consider it a privelige to serve you.

## 2023-04-17 ENCOUNTER — Encounter: Payer: Self-pay | Admitting: Family Medicine

## 2023-04-17 DIAGNOSIS — Z23 Encounter for immunization: Secondary | ICD-10-CM | POA: Insufficient documentation

## 2023-04-17 DIAGNOSIS — I10 Essential (primary) hypertension: Secondary | ICD-10-CM | POA: Insufficient documentation

## 2023-04-17 HISTORY — DX: Encounter for immunization: Z23

## 2023-04-17 NOTE — Progress Notes (Signed)
Adrienne [name]     MRN: [id number]      DOB: Sep 10, 1945  Chief Complaint  Patient presents with   Follow-up    Follow up flu shot questions about vaccines    HPI Ms. Dembek is here for follow up and re-evaluation of chronic medical conditions, medication management and review of any available recent lab and radiology data.  Preventive health is updated, specifically  Cancer screening and Immunization.Has questions re immunization needed and will need consultation wit GI to determine screening methods available to her has had tubular adenoma  colonoscopy due , barrier being her age   Has breast reduction surgery scheduled for January and is looking forward o this, she will benefit greatly The PT denies any adverse reactions to current medications since the last visit.  Interested in referral for nutrition ed due to persistent IGT and hyperlipidemia Plans to increase exercise commitment   ROS Denies recent fever or chills. Denies sinus pressure, nasal congestion, ear pain or sore throat. Denies chest congestion, productive cough or wheezing. Denies chest pains, palpitations and leg swelling Denies abdominal pain, nausea, vomiting,diarrhea or constipation.   Denies dysuria, frequency, hesitancy or incontinence. Denies joint pain, swelling and limitation in mobility. Denies headaches, seizures, numbness, or tingling. Denies depression, anxiety or insomnia. Denies skin break down or rash.   PE  BP 111/75 (BP Location: Right Arm, Patient Position: Sitting, Cuff Size: Large)   Pulse 63   Ht 5\' 5"  (1.651 m)   Wt 159 lb 0.6 oz (72.1 kg)   SpO2 96%   BMI 26.47 kg/m   Patient alert and oriented and in no cardiopulmonary distress.  HEENT: No facial asymmetry, EOMI,     Neck supple .  Chest: Clear to auscultation bilaterally.  CVS: S1, S2 no murmurs, no S3.Regular rate.  ABD: Soft non tender.   Ext: No edema  MS: Adequate ROM spine, shoulders, hips and knees.  Skin: Intact,  no ulcerations or rash noted.  Psych: Good eye contact, normal affect. Memory intact not anxious or depressed appearing.  CNS: CN 2-12 intact, power,  normal throughout.no focal deficits noted.   Assessment & Plan  Primary hypertension Controlled, no change in medication DASH diet and commitment to daily physical activity for a minimum of 30 minutes discussed and encouraged, as a part of hypertension management. The importance of attaining a healthy weight is also discussed.     04/15/2023    2:45 PM 04/13/2023    1:00 PM 12/14/2022    1:52 PM 09/10/2022    1:05 PM 07/03/2022    1:49 PM 07/03/2022    1:46 PM 05/25/2022    2:47 PM  BP/Weight  Systolic BP 111 117 127 118 134 155 115  Diastolic BP 75 74 77 77 84 94 78  Wt. (Lbs) 159.04  158.6 160.04   163.8  BMI 26.47 kg/m2  25.99 kg/m2 26.63 kg/m2   26.84 kg/m2    '  Prediabetes Patient educated about the importance of limiting  Carbohydrate intake , the need to commit to daily physical activity for a minimum of 30 minutes , and to commit weight loss. The fact that changes in all these areas will reduce or eliminate all together the development of diabetes is stressed.      Latest Ref Rng & Units 04/12/2023    1:37 PM 09/03/2022   10:56 AM 03/08/2022   10:14 AM 08/03/2021    1:27 PM 02/02/2021   10:49 AM  Diabetic Labs  HbA1c 4.8 - 5.6 % 5.8  5.8  5.9  5.8  6.1   Chol 100 - 199 mg/dL 811  914  782  956  213   HDL >39 mg/dL 086  97  90  578  95   Calc LDL 0 - 99 mg/dL 469  629  528  413  244   Triglycerides 0 - 149 mg/dL 75  010  70  75  89   Creatinine 0.57 - 1.00 mg/dL 2.72  5.36  6.44  0.34  0.95       04/15/2023    2:45 PM 04/13/2023    1:00 PM 12/14/2022    1:52 PM 09/10/2022    1:05 PM 07/03/2022    1:49 PM 07/03/2022    1:46 PM 05/25/2022    2:47 PM  BP/Weight  Systolic BP 111 117 127 118 134 155 115  Diastolic BP 75 74 77 77 84 94 78  Wt. (Lbs) 159.04  158.6 160.04   163.8  BMI 26.47 kg/m2  25.99 kg/m2 26.63 kg/m2    26.84 kg/m2       No data to display          Unchanged , refer to nutrition  Overweight (BMI 25.0-29.9)  Patient re-educated about  the importance of commitment to a  minimum of 150 minutes of exercise per week as able.  The importance of healthy food choices with portion control discussed, as well as eating regularly and within a 12 hour window most days. The need to choose "clean , green" food 50 to 75% of the time is discussed, as well as to make water the primary drink and set a goal of 64 ounces water daily.       04/15/2023    2:45 PM 12/14/2022    1:52 PM 09/10/2022    1:05 PM  Weight /BMI  Weight 159 lb 0.6 oz 158 lb 9.6 oz 160 lb 0.6 oz  Height 5\' 5"  (1.651 m) 5' 5.5" (1.664 m) 5\' 5"  (1.651 m)  BMI 26.47 kg/m2 25.99 kg/m2 26.63 kg/m2    Unchnaged refer to nutrition  Hyperlipidemia LDL goal <100 Hyperlipidemia:Low fat diet discussed and encouraged.   Lipid Panel  Lab Results  Component Value Date   CHOL 278 (H) 04/12/2023   HDL 111 04/12/2023   LDLCALC 155 (H) 04/12/2023   TRIG 75 04/12/2023   CHOLHDL 2.5 04/12/2023    Markedly elevated cholesterol, total and LDL, refer to nutrition   Large breasts Upcoming breast reduction surgery scheduled for 04/2023  Encounter for immunization After obtaining informed consent, the influenza  vaccine is  administered , with no adverse effect noted at the time of administration.

## 2023-04-17 NOTE — Assessment & Plan Note (Signed)
Patient educated about the importance of limiting  Carbohydrate intake , the need to commit to daily physical activity for a minimum of 30 minutes , and to commit weight loss. The fact that changes in all these areas will reduce or eliminate all together the development of diabetes is stressed.      Latest Ref Rng & Units 04/12/2023    1:37 PM 09/03/2022   10:56 AM 03/08/2022   10:14 AM 08/03/2021    1:27 PM 02/02/2021   10:49 AM  Diabetic Labs  HbA1c 4.8 - 5.6 % 5.8  5.8  5.9  5.8  6.1   Chol 100 - 199 mg/dL 253  664  403  474  259   HDL >39 mg/dL 563  97  90  875  95   Calc LDL 0 - 99 mg/dL 643  329  518  841  660   Triglycerides 0 - 149 mg/dL 75  630  70  75  89   Creatinine 0.57 - 1.00 mg/dL 1.60  1.09  3.23  5.57  0.95       04/15/2023    2:45 PM 04/13/2023    1:00 PM 12/14/2022    1:52 PM 09/10/2022    1:05 PM 07/03/2022    1:49 PM 07/03/2022    1:46 PM 05/25/2022    2:47 PM  BP/Weight  Systolic BP 111 117 127 118 134 155 115  Diastolic BP 75 74 77 77 84 94 78  Wt. (Lbs) 159.04  158.6 160.04   163.8  BMI 26.47 kg/m2  25.99 kg/m2 26.63 kg/m2   26.84 kg/m2       No data to display          Unchanged , refer to nutrition

## 2023-04-17 NOTE — Assessment & Plan Note (Signed)
After obtaining informed consent, the influenza vaccine is  administered , with no adverse effect noted at the time of administration.

## 2023-04-17 NOTE — Assessment & Plan Note (Signed)
Upcoming breast reduction surgery scheduled for 04/2023

## 2023-04-17 NOTE — Assessment & Plan Note (Signed)
Hyperlipidemia:Low fat diet discussed and encouraged.   Lipid Panel  Lab Results  Component Value Date   CHOL 278 (H) 04/12/2023   HDL 111 04/12/2023   LDLCALC 155 (H) 04/12/2023   TRIG 75 04/12/2023   CHOLHDL 2.5 04/12/2023    Markedly elevated cholesterol, total and LDL, refer to nutrition

## 2023-04-17 NOTE — Assessment & Plan Note (Signed)
  Patient re-educated about  the importance of commitment to a  minimum of 150 minutes of exercise per week as able.  The importance of healthy food choices with portion control discussed, as well as eating regularly and within a 12 hour window most days. The need to choose "clean , green" food 50 to 75% of the time is discussed, as well as to make water the primary drink and set a goal of 64 ounces water daily.       04/15/2023    2:45 PM 12/14/2022    1:52 PM 09/10/2022    1:05 PM  Weight /BMI  Weight 159 lb 0.6 oz 158 lb 9.6 oz 160 lb 0.6 oz  Height 5\' 5"  (1.651 m) 5' 5.5" (1.664 m) 5\' 5"  (1.651 m)  BMI 26.47 kg/m2 25.99 kg/m2 26.63 kg/m2    Unchnaged refer to nutrition

## 2023-04-17 NOTE — Assessment & Plan Note (Signed)
Controlled, no change in medication DASH diet and commitment to daily physical activity for a minimum of 30 minutes discussed and encouraged, as a part of hypertension management. The importance of attaining a healthy weight is also discussed.     04/15/2023    2:45 PM 04/13/2023    1:00 PM 12/14/2022    1:52 PM 09/10/2022    1:05 PM 07/03/2022    1:49 PM 07/03/2022    1:46 PM 05/25/2022    2:47 PM  BP/Weight  Systolic BP 111 117 127 118 134 155 115  Diastolic BP 75 74 77 77 84 94 78  Wt. (Lbs) 159.04  158.6 160.04   163.8  BMI 26.47 kg/m2  25.99 kg/m2 26.63 kg/m2   26.84 kg/m2    '

## 2023-04-18 ENCOUNTER — Encounter (INDEPENDENT_AMBULATORY_CARE_PROVIDER_SITE_OTHER): Payer: Self-pay | Admitting: *Deleted

## 2023-04-22 ENCOUNTER — Encounter: Payer: Self-pay | Admitting: Family Medicine

## 2023-04-23 DIAGNOSIS — Z23 Encounter for immunization: Secondary | ICD-10-CM | POA: Diagnosis not present

## 2023-05-04 ENCOUNTER — Encounter (HOSPITAL_BASED_OUTPATIENT_CLINIC_OR_DEPARTMENT_OTHER): Payer: Self-pay | Admitting: Plastic Surgery

## 2023-05-04 ENCOUNTER — Other Ambulatory Visit: Payer: Self-pay

## 2023-05-06 DIAGNOSIS — Z23 Encounter for immunization: Secondary | ICD-10-CM | POA: Diagnosis not present

## 2023-05-09 DIAGNOSIS — H2513 Age-related nuclear cataract, bilateral: Secondary | ICD-10-CM | POA: Diagnosis not present

## 2023-05-09 DIAGNOSIS — H401131 Primary open-angle glaucoma, bilateral, mild stage: Secondary | ICD-10-CM | POA: Diagnosis not present

## 2023-05-11 ENCOUNTER — Ambulatory Visit (HOSPITAL_BASED_OUTPATIENT_CLINIC_OR_DEPARTMENT_OTHER)
Admission: RE | Admit: 2023-05-11 | Discharge: 2023-05-11 | Disposition: A | Discharge status: Home / Self Care | Payer: Medicare Other | Attending: Plastic Surgery | Admitting: Plastic Surgery

## 2023-05-11 ENCOUNTER — Ambulatory Visit (HOSPITAL_BASED_OUTPATIENT_CLINIC_OR_DEPARTMENT_OTHER): Payer: Self-pay | Admitting: Anesthesiology

## 2023-05-11 ENCOUNTER — Ambulatory Visit (HOSPITAL_BASED_OUTPATIENT_CLINIC_OR_DEPARTMENT_OTHER): Payer: Medicare Other | Admitting: Anesthesiology

## 2023-05-11 ENCOUNTER — Encounter (HOSPITAL_BASED_OUTPATIENT_CLINIC_OR_DEPARTMENT_OTHER)
Admission: RE | Disposition: A | Discharge status: Home / Self Care | Payer: Self-pay | Source: Home / Self Care | Attending: Plastic Surgery

## 2023-05-11 ENCOUNTER — Other Ambulatory Visit: Payer: Self-pay

## 2023-05-11 ENCOUNTER — Encounter (HOSPITAL_BASED_OUTPATIENT_CLINIC_OR_DEPARTMENT_OTHER): Payer: Self-pay | Admitting: Plastic Surgery

## 2023-05-11 DIAGNOSIS — N62 Hypertrophy of breast: Secondary | ICD-10-CM | POA: Diagnosis not present

## 2023-05-11 DIAGNOSIS — M542 Cervicalgia: Secondary | ICD-10-CM | POA: Insufficient documentation

## 2023-05-11 DIAGNOSIS — I10 Essential (primary) hypertension: Secondary | ICD-10-CM | POA: Insufficient documentation

## 2023-05-11 DIAGNOSIS — M546 Pain in thoracic spine: Secondary | ICD-10-CM | POA: Insufficient documentation

## 2023-05-11 DIAGNOSIS — R7303 Prediabetes: Secondary | ICD-10-CM | POA: Diagnosis not present

## 2023-05-11 DIAGNOSIS — G8929 Other chronic pain: Secondary | ICD-10-CM | POA: Diagnosis not present

## 2023-05-11 DIAGNOSIS — Z01818 Encounter for other preprocedural examination: Secondary | ICD-10-CM

## 2023-05-11 HISTORY — DX: Prediabetes: R73.03

## 2023-05-11 HISTORY — PX: BREAST REDUCTION SURGERY: SHX8

## 2023-05-11 HISTORY — PX: REDUCTION MAMMAPLASTY: SUR839

## 2023-05-11 HISTORY — DX: Dyspnea, unspecified: R06.00

## 2023-05-11 SURGERY — BREAST REDUCTION WITH LIPOSUCTION
Anesthesia: General | Site: Breast | Laterality: Bilateral

## 2023-05-11 MED ORDER — FENTANYL CITRATE (PF) 100 MCG/2ML IJ SOLN
INTRAMUSCULAR | Status: AC
  Filled 2023-05-11: qty 2

## 2023-05-11 MED ORDER — PHENYLEPHRINE HCL (PRESSORS) 10 MG/ML IV SOLN
INTRAVENOUS | Status: DC | PRN
  Administered 2023-05-11: 80 ug via INTRAVENOUS

## 2023-05-11 MED ORDER — SUCCINYLCHOLINE CHLORIDE 200 MG/10ML IV SOSY
PREFILLED_SYRINGE | INTRAVENOUS | Status: AC
  Filled 2023-05-11: qty 10

## 2023-05-11 MED ORDER — ACETAMINOPHEN 500 MG PO TABS
1000.0000 mg | ORAL_TABLET | Freq: Once | ORAL | Status: AC
  Administered 2023-05-11: 1000 mg via ORAL

## 2023-05-11 MED ORDER — VASHE WOUND IRRIGATION OPTIME
TOPICAL | Status: DC | PRN
  Administered 2023-05-11: 34 [oz_av]

## 2023-05-11 MED ORDER — ONDANSETRON HCL 4 MG/2ML IJ SOLN: 4.0000 mg | Freq: Once | INTRAMUSCULAR | Status: DC | PRN

## 2023-05-11 MED ORDER — EPHEDRINE 5 MG/ML INJ
INTRAVENOUS | Status: AC
  Filled 2023-05-11: qty 5

## 2023-05-11 MED ORDER — FENTANYL CITRATE (PF) 100 MCG/2ML IJ SOLN
INTRAMUSCULAR | Status: DC | PRN
  Administered 2023-05-11 (×2): 50 ug via INTRAVENOUS

## 2023-05-11 MED ORDER — PHENYLEPHRINE 80 MCG/ML (10ML) SYRINGE FOR IV PUSH (FOR BLOOD PRESSURE SUPPORT)
PREFILLED_SYRINGE | INTRAVENOUS | Status: AC
Start: 2023-05-11 — End: ?
  Filled 2023-05-11: qty 10

## 2023-05-11 MED ORDER — SODIUM CHLORIDE 0.9 % IV SOLN
INTRAVENOUS | Status: DC | PRN
  Administered 2023-05-11: 40 mL

## 2023-05-11 MED ORDER — LIDOCAINE HCL (PF) 1 % IJ SOLN
INTRAMUSCULAR | Status: AC
  Filled 2023-05-11: qty 180

## 2023-05-11 MED ORDER — CHLORHEXIDINE GLUCONATE CLOTH 2 % EX PADS: 6.0000 | MEDICATED_PAD | Freq: Once | CUTANEOUS | Status: DC

## 2023-05-11 MED ORDER — LACTATED RINGERS IV SOLN: INTRAVENOUS | Status: DC

## 2023-05-11 MED ORDER — MIDAZOLAM HCL 2 MG/2ML IJ SOLN
INTRAMUSCULAR | Status: AC
Start: 2023-05-11 — End: ?
  Filled 2023-05-11: qty 2

## 2023-05-11 MED ORDER — ATROPINE SULFATE 0.4 MG/ML IV SOLN
INTRAVENOUS | Status: AC
  Filled 2023-05-11: qty 1

## 2023-05-11 MED ORDER — FENTANYL CITRATE (PF) 100 MCG/2ML IJ SOLN
25.0000 ug | INTRAMUSCULAR | Status: DC | PRN
  Administered 2023-05-11 (×2): 25 ug via INTRAVENOUS

## 2023-05-11 MED ORDER — DEXAMETHASONE SODIUM PHOSPHATE 10 MG/ML IJ SOLN
INTRAMUSCULAR | Status: AC
  Filled 2023-05-11: qty 1

## 2023-05-11 MED ORDER — CEFAZOLIN SODIUM-DEXTROSE 2-4 GM/100ML-% IV SOLN
INTRAVENOUS | Status: AC
  Filled 2023-05-11: qty 100

## 2023-05-11 MED ORDER — ONDANSETRON HCL 4 MG/2ML IJ SOLN
INTRAMUSCULAR | Status: AC
  Filled 2023-05-11: qty 2

## 2023-05-11 MED ORDER — LIDOCAINE-EPINEPHRINE 1 %-1:100000 IJ SOLN
INTRAMUSCULAR | Status: AC
  Filled 2023-05-11: qty 4

## 2023-05-11 MED ORDER — LIDOCAINE 2% (20 MG/ML) 5 ML SYRINGE
INTRAMUSCULAR | Status: AC
  Filled 2023-05-11: qty 5

## 2023-05-11 MED ORDER — BUPIVACAINE HCL (PF) 0.25 % IJ SOLN
INTRAMUSCULAR | Status: AC
  Filled 2023-05-11: qty 120

## 2023-05-11 MED ORDER — EPHEDRINE SULFATE (PRESSORS) 50 MG/ML IJ SOLN
INTRAMUSCULAR | Status: DC | PRN
  Administered 2023-05-11: 10 mg via INTRAVENOUS
  Administered 2023-05-11: 5 mg via INTRAVENOUS

## 2023-05-11 MED ORDER — MIDAZOLAM HCL 5 MG/5ML IJ SOLN
INTRAMUSCULAR | Status: DC | PRN
  Administered 2023-05-11: 1 mg via INTRAVENOUS

## 2023-05-11 MED ORDER — SODIUM CHLORIDE 0.9 % IV SOLN: INTRAVENOUS | Status: DC | PRN

## 2023-05-11 MED ORDER — OXYCODONE HCL 5 MG PO TABS
ORAL_TABLET | ORAL | Status: AC
  Filled 2023-05-11: qty 1

## 2023-05-11 MED ORDER — PROPOFOL 10 MG/ML IV BOLUS
INTRAVENOUS | Status: DC | PRN
  Administered 2023-05-11: 200 mg via INTRAVENOUS

## 2023-05-11 MED ORDER — KETAMINE HCL 50 MG/5ML IJ SOSY
PREFILLED_SYRINGE | INTRAMUSCULAR | Status: AC
  Filled 2023-05-11: qty 5

## 2023-05-11 MED ORDER — BUPIVACAINE LIPOSOME 1.3 % IJ SUSP
INTRAMUSCULAR | Status: AC
  Filled 2023-05-11: qty 20

## 2023-05-11 MED ORDER — DEXAMETHASONE SODIUM PHOSPHATE 4 MG/ML IJ SOLN
INTRAMUSCULAR | Status: DC | PRN
  Administered 2023-05-11: 5 mg via INTRAVENOUS

## 2023-05-11 MED ORDER — ONDANSETRON HCL 4 MG/2ML IJ SOLN
INTRAMUSCULAR | Status: DC | PRN
  Administered 2023-05-11: 4 mg via INTRAVENOUS

## 2023-05-11 MED ORDER — ACETAMINOPHEN 500 MG PO TABS
ORAL_TABLET | ORAL | Status: AC
  Filled 2023-05-11: qty 2

## 2023-05-11 MED ORDER — EPINEPHRINE PF 1 MG/ML IJ SOLN
INTRAMUSCULAR | Status: AC
  Filled 2023-05-11: qty 3

## 2023-05-11 MED ORDER — OXYCODONE HCL 5 MG PO TABS
5.0000 mg | ORAL_TABLET | Freq: Once | ORAL | Status: AC
  Administered 2023-05-11: 5 mg via ORAL

## 2023-05-11 MED ORDER — LIDOCAINE-EPINEPHRINE 1 %-1:100000 IJ SOLN
INTRAMUSCULAR | Status: DC | PRN
  Administered 2023-05-11: 50 mL

## 2023-05-11 MED ORDER — CEFAZOLIN SODIUM-DEXTROSE 2-4 GM/100ML-% IV SOLN
2.0000 g | INTRAVENOUS | Status: AC
  Administered 2023-05-11: 2 g via INTRAVENOUS

## 2023-05-11 MED ORDER — AMISULPRIDE (ANTIEMETIC) 5 MG/2ML IV SOLN: 10.0000 mg | Freq: Once | INTRAVENOUS | Status: DC | PRN

## 2023-05-11 MED ORDER — DEXMEDETOMIDINE HCL IN NACL 80 MCG/20ML IV SOLN
INTRAVENOUS | Status: AC
  Filled 2023-05-11: qty 20

## 2023-05-11 MED ORDER — SODIUM CHLORIDE (PF) 0.9 % IJ SOLN
INTRAMUSCULAR | Status: DC | PRN
  Administered 2023-05-11: 1000 mL

## 2023-05-11 MED ORDER — SODIUM CHLORIDE (PF) 0.9 % IJ SOLN
INTRAMUSCULAR | Status: AC
  Filled 2023-05-11: qty 60

## 2023-05-11 SURGICAL SUPPLY — 64 items
BINDER BREAST LRG (GAUZE/BANDAGES/DRESSINGS) IMPLANT
BINDER BREAST MEDIUM (GAUZE/BANDAGES/DRESSINGS) IMPLANT
BINDER BREAST XLRG (GAUZE/BANDAGES/DRESSINGS) IMPLANT
BINDER BREAST XXLRG (GAUZE/BANDAGES/DRESSINGS) IMPLANT
BIOPATCH RED 1 DISK 7.0 (GAUZE/BANDAGES/DRESSINGS) IMPLANT
BLADE HEX COATED 2.75 (ELECTRODE) IMPLANT
BLADE KNIFE PERSONA 10 (BLADE) ×2 IMPLANT
BLADE SURG 15 STRL LF DISP TIS (BLADE) ×1 IMPLANT
CANISTER SUCT 1200ML W/VALVE (MISCELLANEOUS) ×1 IMPLANT
CLEANSER WND VASHE 34 (WOUND CARE) ×1 IMPLANT
COLLAGEN CELLERATERX 5 GRAM (Miscellaneous) IMPLANT
COVER BACK TABLE 60X90IN (DRAPES) ×1 IMPLANT
COVER MAYO STAND STRL (DRAPES) ×1 IMPLANT
DERMABOND ADVANCED .7 DNX12 (GAUZE/BANDAGES/DRESSINGS) ×2 IMPLANT
DRAIN CHANNEL 19F RND (DRAIN) IMPLANT
DRAPE LAPAROSCOPIC ABDOMINAL (DRAPES) ×1 IMPLANT
DRAPE UTILITY XL STRL (DRAPES) ×1 IMPLANT
DRSG MEPILEX POST OP 4X8 (GAUZE/BANDAGES/DRESSINGS) ×2 IMPLANT
DRSG TEGADERM 4X4.75 (GAUZE/BANDAGES/DRESSINGS) IMPLANT
ELECT BLADE 4.0 EZ CLEAN MEGAD (MISCELLANEOUS) ×1
ELECT REM PT RETURN 9FT ADLT (ELECTROSURGICAL) ×1
ELECTRODE BLDE 4.0 EZ CLN MEGD (MISCELLANEOUS) ×1 IMPLANT
ELECTRODE REM PT RTRN 9FT ADLT (ELECTROSURGICAL) ×1 IMPLANT
EVACUATOR SILICONE 100CC (DRAIN) IMPLANT
GAUZE PAD ABD 8X10 STRL (GAUZE/BANDAGES/DRESSINGS) ×2 IMPLANT
GLOVE BIO SURGEON STRL SZ 6.5 (GLOVE) ×2 IMPLANT
GLOVE BIO SURGEON STRL SZ7.5 (GLOVE) ×1 IMPLANT
GLOVE BIOGEL PI IND STRL 7.0 (GLOVE) IMPLANT
GLOVE BIOGEL PI IND STRL 8 (GLOVE) IMPLANT
GOWN STRL REUS W/ TWL LRG LVL3 (GOWN DISPOSABLE) ×2 IMPLANT
GOWN STRL REUS W/ TWL XL LVL3 (GOWN DISPOSABLE) ×1 IMPLANT
LINER CANISTER 1000CC FLEX (MISCELLANEOUS) ×1 IMPLANT
NDL FILTER BLUNT 18X1 1/2 (NEEDLE) IMPLANT
NDL HYPO 25X1 1.5 SAFETY (NEEDLE) ×2 IMPLANT
NEEDLE FILTER BLUNT 18X1 1/2 (NEEDLE) ×1
NEEDLE HYPO 25X1 1.5 SAFETY (NEEDLE) ×2
NS IRRIG 1000ML POUR BTL (IV SOLUTION) IMPLANT
PACK BASIN DAY SURGERY FS (CUSTOM PROCEDURE TRAY) ×1 IMPLANT
PAD ALCOHOL SWAB (MISCELLANEOUS) IMPLANT
PAD FOAM SILICONE BACKED (GAUZE/BANDAGES/DRESSINGS) IMPLANT
PENCIL SMOKE EVACUATOR (MISCELLANEOUS) ×1 IMPLANT
PIN SAFETY STERILE (MISCELLANEOUS) IMPLANT
SLEEVE SCD COMPRESS KNEE MED (STOCKING) ×1 IMPLANT
SPIKE FLUID TRANSFER (MISCELLANEOUS) IMPLANT
SPONGE T-LAP 18X18 ~~LOC~~+RFID (SPONGE) ×2 IMPLANT
STRIP SUTURE WOUND CLOSURE 1/2 (MISCELLANEOUS) ×4 IMPLANT
SUT MNCRL AB 4-0 PS2 18 (SUTURE) ×4 IMPLANT
SUT MON AB 3-0 SH27 (SUTURE) ×4 IMPLANT
SUT MON AB 5-0 PS2 18 (SUTURE) IMPLANT
SUT PDS 3-0 CT2 (SUTURE) ×6
SUT PDS II 3-0 CT2 27 ABS (SUTURE) ×4 IMPLANT
SUT SILK 3 0 PS 1 (SUTURE) IMPLANT
SYR 10ML LL (SYRINGE) IMPLANT
SYR 50ML LL SCALE MARK (SYRINGE) IMPLANT
SYR BULB IRRIG 60ML STRL (SYRINGE) ×1 IMPLANT
SYR CONTROL 10ML LL (SYRINGE) ×2 IMPLANT
TAPE MEASURE VINYL STERILE (MISCELLANEOUS) IMPLANT
TOWEL GREEN STERILE FF (TOWEL DISPOSABLE) ×3 IMPLANT
TRAY DSU PREP LF (CUSTOM PROCEDURE TRAY) ×1 IMPLANT
TUBE CONNECTING 20X1/4 (TUBING) ×1 IMPLANT
TUBING INFILTRATION IT-10001 (TUBING) IMPLANT
TUBING SET GRADUATE ASPIR 12FT (MISCELLANEOUS) IMPLANT
UNDERPAD 30X36 HEAVY ABSORB (UNDERPADS AND DIAPERS) ×2 IMPLANT
YANKAUER SUCT BULB TIP NO VENT (SUCTIONS) ×1 IMPLANT

## 2023-05-11 NOTE — Discharge Instructions (Addendum)
 INSTRUCTIONS FOR AFTER BREAST SURGERY   You will likely have some questions about what to expect following your operation.  The following information will help you and your family understand what to expect when you are discharged from the hospital.  It is important to follow these guidelines to help ensure a smooth recovery and reduce complication.  Postoperative instructions include information on: diet, wound care, medications and physical activity.  AFTER SURGERY Expect to go home after the procedure.  In some cases, you may need to spend one night in the hospital for observation.  DIET Breast surgery does not require a specific diet.  However, the healthier you eat the better your body will heal. It is important to increasing your protein intake.  This means limiting the foods with sugar and carbohydrates.  Focus on vegetables and some meat.  If you have liposuction during your procedure be sure to drink water .  If your urine is bright yellow, then it is concentrated, and you need to drink more water .  As a general rule after surgery, you should have 8 ounces of water  every hour while awake.  If you find you are persistently nauseated or unable to take in liquids let us  know.  NO TOBACCO USE or EXPOSURE.  This will slow your healing process and lead to a wound.  WOUND CARE Leave the binder on for 3 days . Use fragrance free soap like Dial, Dove or [place].   After 3 days you can remove the binder to shower. Once dry apply binder or sports bra. If you have liposuction you will have a soft and spongy dressing (Lipofoam) that helps prevent creases in your skin.  Remove before you shower and then replace it.  It is also available on [employer]. If you have steri-strips / tape directly attached to your skin leave them in place. It is OK to get these wet.   No baths, pools or hot tubs for four weeks. We close your incision to leave the smallest and best-looking scar. No ointment or creams on your incisions  for four weeks.  No Neosporin (Too many skin reactions).  A few weeks after surgery you can use Mederma and start massaging the scar. We ask you to wear your binder or sports bra for the first 6 weeks around the clock, including while sleeping. This provides added comfort and helps reduce the fluid accumulation at the surgery site. NO Ice or heating pads to the operative site.  You have a very high risk of a BURN before you feel the temperature change.  ACTIVITY No heavy lifting until cleared by the doctor.  This usually means no more than a half-gallon of milk.  It is OK to walk and climb stairs. Moving your legs is very important to decrease your risk of a blood clot.  It will also help keep you from getting deconditioned.  Every 1 to 2 hours get up and walk for 5 minutes. This will help with a quicker recovery back to normal.  Let pain be your guide so you don't do too much.  This time is for you to recover.  You will be more comfortable if you sleep and rest with your head elevated either with a few pillows under you or in a recliner.  No stomach sleeping for a three months.  WORK Everyone returns to work at different times. As a rough guide, most people take at least 1 - 2 weeks off prior to returning to work. If  you need documentation for your job, give the forms to the front staff at the clinic.  DRIVING Arrange for someone to bring you home from the hospital after your surgery.  You may be able to drive a few days after surgery but not while taking any narcotics or valium.  BOWEL MOVEMENTS Constipation can occur after anesthesia and while taking pain medication.  It is important to stay ahead for your comfort.  We recommend taking Milk of Magnesia (2 tablespoons; twice a day) while taking the pain pills.  MEDICATIONS You may be prescribed should start after surgery At your preoperative visit for you history and physical you may have been given the following medications: An antibiotic: Start  this medication when you get home and take according to the instructions on the bottle. Zofran  4 mg:  This is to treat nausea and vomiting.  You can take this every 6 hours as needed and only if needed. Valium 2 mg for breast cancer patients: This is for muscle tightness if you have an implant or expander. This will help relax your muscle which also helps with pain control.  This can be taken every 12 hours as needed. Don't drive after taking this medication. Norco (hydrocodone/acetaminophen ) 5/325 mg:  This is only to be used after you have taken the Motrin  or the Tylenol . Every 8 hours as needed.   Over the counter Medication to take: Ibuprofen  (Motrin ) 600 mg:  Take this every 6 hours.  If you have additional pain then take 500 mg of the Tylenol  every 8 hours.  Only take the Norco after you have tried these two. MiraLAX or Milk of Magnesia: Take this according to the bottle if you take the Norco.  WHEN TO CALL Call your surgeon's office if any of the following occur: Fever 101 degrees F or greater Excessive bleeding or fluid from the incision site. Pain that increases over time without aid from the medications Redness, warmth, or pus draining from incision sites Persistent nausea or inability to take in liquids Severe misshapen area that underwent the operation.   No Tylenol  before 1:30pm  Post Anesthesia Home Care Instructions  Activity: Get plenty of rest for the remainder of the day. A responsible individual must stay with you for 24 hours following the procedure.  For the next 24 hours, DO NOT: -Drive a car -Advertising copywriter -Drink alcoholic beverages -Take any medication unless instructed by your physician -Make any legal decisions or sign important papers.  Meals: Start with liquid foods such as gelatin or soup. Progress to regular foods as tolerated. Avoid greasy, spicy, heavy foods. If nausea and/or vomiting occur, drink only clear liquids until the nausea and/or  vomiting subsides. Call your physician if vomiting continues.  Special Instructions/Symptoms: Your throat may feel dry or sore from the anesthesia or the breathing tube placed in your throat during surgery. If this causes discomfort, gargle with warm salt water . The discomfort should disappear within 24 hours.

## 2023-05-11 NOTE — Anesthesia Procedure Notes (Signed)
 Procedure Name: LMA Insertion Date/Time: 05/11/2023 8:45 AM  Performed by: Eugenia Hess, CRNAPre-anesthesia Checklist: Patient identified, Emergency Drugs available, Suction available and Patient being monitored Patient Re-evaluated:Patient Re-evaluated prior to induction Oxygen Delivery Method: Circle System Utilized Preoxygenation: Pre-oxygenation with 100% oxygen Induction Type: IV induction Ventilation: Mask ventilation without difficulty LMA: LMA inserted LMA Size: 4.0 Number of attempts: 1 Airway Equipment and Method: bite block Placement Confirmation: positive ETCO2 Tube secured with: Tape Dental Injury: Teeth and Oropharynx as per pre-operative assessment

## 2023-05-11 NOTE — Interval H&P Note (Signed)
 History and Physical Interval Note:  05/11/2023 7:42 AM  [name]  has presented today for surgery, with the diagnosis of Macromastia.  The various methods of treatment have been discussed with the patient and family. After consideration of risks, benefits and other options for treatment, the patient has consented to  Procedure(s): BREAST REDUCTION WITH LIPOSUCTION (Bilateral) as a surgical intervention.  The patient's history has been reviewed, patient examined, no change in status, stable for surgery.  I have reviewed the patient's chart and labs.  Questions were answered to the patient's satisfaction.     [name]

## 2023-05-11 NOTE — Transfer of Care (Signed)
 Immediate Anesthesia Transfer of Care Note  Patient: Adrienne [name]  Procedure(s) Performed: BREAST REDUCTION WITH LIPOSUCTION (Bilateral: Breast)  Patient Location: PACU  Anesthesia Type:General  Level of Consciousness: awake, drowsy, and patient cooperative  Airway & Oxygen Therapy: Patient Spontanous Breathing and Patient connected to face mask oxygen  Post-op Assessment: Report given to RN and Post -op Vital signs reviewed and stable  Post vital signs: Reviewed and stable  Last Vitals:  Vitals Value Taken Time  BP    Temp    Pulse    Resp    SpO2      Last Pain:  Vitals:   05/11/23 0718  TempSrc: Temporal  PainSc: 0-No pain      Patients Stated Pain Goal: 4 (05/11/23 0718)  Complications: No notable events documented.

## 2023-05-11 NOTE — Op Note (Signed)
 Breast Reduction Op note:    DATE OF PROCEDURE: 05/11/2023  LOCATION: [facility]  SURGEON: Gilles Lacks, DO  ASSISTANT: Perrin Brakeman, PA  PREOPERATIVE DIAGNOSIS 1. Macromastia 2. Neck Pain 3. Back Pain  POSTOPERATIVE DIAGNOSIS 1. Macromastia 2. Neck Pain 3. Back Pain  PROCEDURES 1. Bilateral breast reduction.  Right reduction 565 g, Left reduction 590 g  COMPLICATIONS: None.  DRAINS: none  INDICATIONS FOR PROCEDURE Adrienne [name] is a 78 y.o. year-old female born on 03/07/1946,with a history of symptomatic macromastia with concominant back pain, neck pain, shoulder grooving from her bra.   MRN: [id number]  CONSENT Informed consent was obtained directly from the patient. The risks, benefits and alternatives were fully discussed. Specific risks including but not limited to bleeding, infection, hematoma, seroma, scarring, pain, nipple necrosis, asymmetry, poor cosmetic results, and need for further surgery were discussed. The patient's questions were answered.  DESCRIPTION OF PROCEDURE  Patient was brought into the operating room and rested on the operating room table in the supine position.  SCDs were placed and appropriate padding was performed.  Antibiotics were given. The patient underwent general anesthesia and the chest was prepped and draped in a sterile fashion.  A timeout was performed and all information was confirmed to be correct by those in the room. Tumescent was placed in the lateral breasts bilaterally.  Liposuction was done bilaterally for improved contour and symmetry.  Right side: Preoperative markings were confirmed.  Incision lines were injected with local containing epinephrine .  After waiting for vasoconstriction, the marked lines were incised with a #15 blade.  A Wise-pattern superomedial breast reduction was performed by de-epithelializing the pedicle, using bovie to create the superomedial pedicle, and removing breast  tissue from the superior, lateral, and inferior portions of the breast.  Care was taken to not undermine the breast pedicle. Hemostasis was achieved.  The nipple was gently rotated into position and the soft tissue closed with 4-0 Monocryl.   The pocket was irrigated and hemostasis confirmed.  The deep tissues were approximated with 3-0 PDS sutures.  The skin was closed with deep dermal 3-0 Monocryl and subcuticular 4-0 Monocryl sutures.  The nipple and skin flaps had good capillary refill at the end of the procedure.    Left side: Preoperative markings were confirmed.  Incision lines were injected with local containing epinephrine .  After waiting for vasoconstriction, the marked lines were incised with a #15 blade.  A Wise-pattern superomedial breast reduction was performed by de-epithelializing the pedicle, using bovie to create the superomedial pedicle, and removing breast tissue from the superior, lateral, and inferior portions of the breast.  Care was taken to not undermine the breast pedicle. Hemostasis was achieved.  The nipple was gently rotated into position and the soft tissue was closed with 4-0 Monocryl.  The patient was sat upright and size and shape symmetry was confirmed.  The pocket was irrigated and hemostasis confirmed. Experel and cellerate were placed in each pocket.  The deep tissues were approximated with 3-0 PDS sutures. The skin was closed with deep dermal 3-0 Monocryl and subcuticular 4-0 Monocryl sutures.  Dermabond was applied.  A breast binder and ABDs were placed.  The nipple and skin flaps had good capillary refill at the end of the procedure.  The patient tolerated the procedure well. The patient was allowed to wake from anesthesia and taken to the recovery room in satisfactory condition.  The advanced practice practitioner (APP) assisted throughout the case.  The APP was  essential in retraction and counter traction when needed to make the case progress smoothly.  This retraction  and assistance made it possible to see the tissue plans for the procedure.  The assistance was needed for blood control, tissue re-approximation and assisted with closure of the incision site.

## 2023-05-11 NOTE — Anesthesia Postprocedure Evaluation (Signed)
 Anesthesia Post Note  Patient: Adrienne [name]  Procedure(s) Performed: BREAST REDUCTION WITH LIPOSUCTION (Bilateral: Breast)     Patient location during evaluation: PACU Anesthesia Type: General Level of consciousness: awake Pain management: pain level controlled Vital Signs Assessment: post-procedure vital signs reviewed and stable Respiratory status: spontaneous breathing, nonlabored ventilation and respiratory function stable Cardiovascular status: blood pressure returned to baseline and stable Postop Assessment: no apparent nausea or vomiting Anesthetic complications: no   No notable events documented.  Last Vitals:  Vitals:   05/11/23 1145 05/11/23 1222  BP: (!) 147/89 138/81  Pulse: 74 75  Resp: 16 20  Temp:  (!) 36.3 C  SpO2: 99% 97%    Last Pain:  Vitals:   05/11/23 1222  TempSrc: Temporal  PainSc: 3                  Estrella Alcaraz P Santoria Chason

## 2023-05-11 NOTE — Anesthesia Preprocedure Evaluation (Addendum)
 Anesthesia Evaluation  Patient identified by MRN, date of birth, ID band Patient awake    Reviewed: Allergy & Precautions, NPO status , Patient's Chart, lab work & pertinent test results  Airway Mallampati: II  TM Distance: >3 FB Neck ROM: Full    Dental no notable dental hx.    Pulmonary neg pulmonary ROS   Pulmonary exam normal        Cardiovascular hypertension, Pt. on medications Normal cardiovascular exam     Neuro/Psych  Headaches  negative psych ROS   GI/Hepatic negative GI ROS, Neg liver ROS,,,  Endo/Other  Pre-DM  Renal/GU negative Renal ROS     Musculoskeletal  (+) Arthritis ,    Abdominal  (+) + obese  Peds  Hematology negative hematology ROS (+)   Anesthesia Other Findings Macromastia  Reproductive/Obstetrics                             Anesthesia Physical Anesthesia Plan  ASA: 2  Anesthesia Plan: General   Post-op Pain Management:    Induction: Intravenous  PONV Risk Score and Plan: 3 and Ondansetron , Dexamethasone , Midazolam , Treatment may vary due to age or medical condition and Propofol  infusion  Airway Management Planned: Oral ETT  Additional Equipment:   Intra-op Plan:   Post-operative Plan: Extubation in OR  Informed Consent: I have reviewed the patients History and Physical, chart, labs and discussed the procedure including the risks, benefits and alternatives for the proposed anesthesia with the patient or authorized representative who has indicated his/her understanding and acceptance.     Dental advisory given  Plan Discussed with: CRNA  Anesthesia Plan Comments:        Anesthesia Quick Evaluation

## 2023-05-12 ENCOUNTER — Encounter (HOSPITAL_BASED_OUTPATIENT_CLINIC_OR_DEPARTMENT_OTHER): Payer: Self-pay | Admitting: Plastic Surgery

## 2023-05-12 LAB — SURGICAL PATHOLOGY

## 2023-05-13 ENCOUNTER — Ambulatory Visit (INDEPENDENT_AMBULATORY_CARE_PROVIDER_SITE_OTHER): Payer: Medicare Other | Admitting: Surgical

## 2023-05-13 DIAGNOSIS — G8929 Other chronic pain: Secondary | ICD-10-CM

## 2023-05-13 DIAGNOSIS — N62 Hypertrophy of breast: Secondary | ICD-10-CM

## 2023-05-13 DIAGNOSIS — M546 Pain in thoracic spine: Secondary | ICD-10-CM

## 2023-05-13 NOTE — Progress Notes (Signed)
Patient is a 78 year old female here for follow-up after bilateral breast reduction with Dr. Ulice Bold 2 days ago on 05/11/2023.  She reports overall she is doing well, she reports she feels "surprisingly well".  She reports her pain has been well-controlled with over-the-counter medications during the day and has not had to take any of the oxycodone.  She reports that she is having normal urination and positive flatus.  No BMs yet.  She does have some questions about showering and dressings.    The patient gave consent to have this visit done by telemedicine / virtual visit,  two identifiers were used to identify patient. This is also consent for access the chart and treat the patient via this visit. The patient is located in [place].  I, the provider, am at the office.  We spent 6 minutes together for the visit.  Joined by telephone.  She is scheduled for initial follow-up in 1 week, recommend calling with any questions or concerns prior to then.  All of her questions were answered to her content.

## 2023-05-20 ENCOUNTER — Encounter: Payer: Self-pay | Admitting: Plastic Surgery

## 2023-05-20 ENCOUNTER — Ambulatory Visit (INDEPENDENT_AMBULATORY_CARE_PROVIDER_SITE_OTHER): Payer: Medicare Other | Admitting: Plastic Surgery

## 2023-05-20 VITALS — BP 153/90 | HR 71 | Ht 65.0 in | Wt 154.0 lb

## 2023-05-20 DIAGNOSIS — N62 Hypertrophy of breast: Secondary | ICD-10-CM

## 2023-05-20 NOTE — Progress Notes (Signed)
The patient is a 78 year old female here for follow-up after undergoing bilateral breast reduction on January 15.  She had over 500 g removed from both breasts.  She may have a little bit of a seroma in each breast but the skin is not tight at all and there is not a fluid wave so we are going to wait and watch.  Will plan to see her back in 2 weeks.  Continue with the sports bra and the TOPA foam.  Pictures were obtained of the patient and placed in the chart with the patient's or guardian's permission.

## 2023-05-31 ENCOUNTER — Ambulatory Visit: Payer: Medicare Other | Admitting: Nutrition

## 2023-06-03 ENCOUNTER — Encounter: Payer: Self-pay | Admitting: Surgical

## 2023-06-03 ENCOUNTER — Ambulatory Visit (INDEPENDENT_AMBULATORY_CARE_PROVIDER_SITE_OTHER): Payer: Medicare Other | Admitting: Surgical

## 2023-06-03 VITALS — BP 142/75 | HR 61

## 2023-06-03 DIAGNOSIS — G8929 Other chronic pain: Secondary | ICD-10-CM

## 2023-06-03 DIAGNOSIS — M546 Pain in thoracic spine: Secondary | ICD-10-CM

## 2023-06-03 DIAGNOSIS — N62 Hypertrophy of breast: Secondary | ICD-10-CM

## 2023-06-03 NOTE — Progress Notes (Signed)
 Patient is a  78 y.o.-year-old female status post bilateral breast reduction with Dr.  Lowery. Patient is 3 weeks postop.  She is doing well.  She is not having any issues.  She feels as if the swelling may be slightly better.  She is not having any infectious symptoms.  She does not have any specific complaints other than she has noticed some sharp shooting pains around the bilateral nipple areola.  Chaperone present on exam Bilateral NAC's are viable, bilateral breast incisions are intact. There is no erythema or cellulitic changes noted. No obvious large subcutaneous fluid collections noted with palpation. Steri-Strips are in place, some of these were removed to reveal well intact incisions.  A/P:  There is no signs infection or concern on exam.   Recommend continuing with compressive garment 24/7 until 6 weeks post-op,  avoiding strenuous activity/heavy lifting until 6 weeks post-op  Recommend following up in 2 weeks All the patient's questions were answered to their content. Recommend calling with any questions or concerns.

## 2023-06-19 ENCOUNTER — Other Ambulatory Visit: Payer: Self-pay | Admitting: Family Medicine

## 2023-06-21 ENCOUNTER — Ambulatory Visit (INDEPENDENT_AMBULATORY_CARE_PROVIDER_SITE_OTHER): Payer: Medicare Other | Admitting: Surgical

## 2023-06-21 DIAGNOSIS — G8929 Other chronic pain: Secondary | ICD-10-CM

## 2023-06-21 DIAGNOSIS — Z719 Counseling, unspecified: Secondary | ICD-10-CM

## 2023-06-21 DIAGNOSIS — N62 Hypertrophy of breast: Secondary | ICD-10-CM

## 2023-06-21 DIAGNOSIS — M546 Pain in thoracic spine: Secondary | ICD-10-CM

## 2023-06-21 NOTE — Progress Notes (Signed)
 Patient is a  78 y.o.-year-old female status post bilateral breast reduction with Dr.  Ulice Bold. Patient is 6 weeks postop.  Patient reports she is overall doing well.  She is not having any infectious symptoms.  She does report that she continues to have some sharp shooting pains throughout the left breast.  She reports her Steri-Strips are still in place.  Chaperone present on exam Bilateral NAC's are viable, bilateral breast incisions are intact. There is no erythema or cellulitic changes noted. No subcutaneous fluid collections noted with palpation. Steri-Strips are in place. Bilateral breasts are symmetric  A/P:  Steri-Strips are removed, patient tolerated this well. Recommend continuing to use Vaseline along incisions to help soften up scabbing and Dermabond.  Discussed recommendations for beginning the use of scar cream once the scabbing and Dermabond has completely come off.  No restrictions at this time, discussed continue with compressive garments during the day when active, but can go without compression at night or when resting.  Recommend following up as needed All the patient's questions were answered to their content. Recommend calling with any questions or concerns.  Pictures were obtained of the patient and placed in the chart with the patient's or guardian's permission.

## 2023-07-11 ENCOUNTER — Telehealth (INDEPENDENT_AMBULATORY_CARE_PROVIDER_SITE_OTHER): Payer: Self-pay | Admitting: Plastic Surgery

## 2023-07-11 ENCOUNTER — Telehealth: Payer: Self-pay

## 2023-07-11 NOTE — Telephone Encounter (Signed)
 Patient is experiencing pain on R Brst, it looks like there may be a bump but feels like a stitch.

## 2023-07-11 NOTE — Telephone Encounter (Signed)
 I called the patient and there was no answer. Left a voicemail for her to call us back. We can offer her an appointment with Gerre Pebbles today at 1:45.

## 2023-07-12 ENCOUNTER — Ambulatory Visit (INDEPENDENT_AMBULATORY_CARE_PROVIDER_SITE_OTHER): Admitting: Surgical

## 2023-07-12 DIAGNOSIS — N62 Hypertrophy of breast: Secondary | ICD-10-CM

## 2023-07-12 DIAGNOSIS — M546 Pain in thoracic spine: Secondary | ICD-10-CM

## 2023-07-12 DIAGNOSIS — G8929 Other chronic pain: Secondary | ICD-10-CM

## 2023-07-12 NOTE — Progress Notes (Signed)
 Patient is a 78 y.o.-year-old female status post bilateral breast reduction with Dr.  Ulice Bold on 05/11/2023.  Patient is 2 months postop.  She presents today with concerns of discomfort of the right breast incision after wearing her bra throughout the day.  She reports that she had her sister look at the area and they noticed a bump along the incision.  She otherwise feels well and does not have any issues or concerns.   Chaperone present on exam Bilateral NAC's are viable, bilateral breast incisions are intact. There is no erythema or cellulitic changes noted. No obvious subcutaneous fluid collections noted with palpation.  She does have 2 bumps along the right lateral breast incision just beneath the skin.  These feel consistent with sutures.  No overlying skin changes.  No erythema or cellulitic changes surrounding the area.  She does also have a small bump along the left lateral breast incision, with no overlying skin changes.   A/P:  Discussed with patient that these are likely PDS sutures that will dissolve in the next few weeks.  We discussed recommendations for massage to the area to assist with discomfort and desensitization.  I do not appreciate any signs of infection or concern on exam today.  Recommend following up in a few weeks if she does not notice any improvement.  We discussed that I was unable to remove them today as they have not broken through the skin just yet.  We discussed that this might happen and if it does we can remove them at that point.  All of the patient's questions were answered to their content. Recommend calling with any questions or concerns.

## 2023-08-16 DIAGNOSIS — H401131 Primary open-angle glaucoma, bilateral, mild stage: Secondary | ICD-10-CM | POA: Diagnosis not present

## 2023-08-26 ENCOUNTER — Other Ambulatory Visit: Payer: Self-pay | Admitting: Family Medicine

## 2023-09-07 ENCOUNTER — Encounter: Payer: Self-pay | Admitting: *Deleted

## 2023-09-12 ENCOUNTER — Telehealth: Payer: Self-pay | Admitting: Plastic Surgery

## 2023-09-12 ENCOUNTER — Ambulatory Visit: Payer: Medicare Other

## 2023-09-12 VITALS — Ht 65.0 in | Wt 158.0 lb

## 2023-09-12 DIAGNOSIS — Z780 Asymptomatic menopausal state: Secondary | ICD-10-CM

## 2023-09-12 DIAGNOSIS — Z1231 Encounter for screening mammogram for malignant neoplasm of breast: Secondary | ICD-10-CM

## 2023-09-12 DIAGNOSIS — Z0000 Encounter for general adult medical examination without abnormal findings: Secondary | ICD-10-CM

## 2023-09-12 DIAGNOSIS — Z1211 Encounter for screening for malignant neoplasm of colon: Secondary | ICD-10-CM

## 2023-09-12 NOTE — Patient Instructions (Signed)
 Ms. Ogden , Thank you for taking time out of your busy schedule to complete your Annual Wellness Visit with me. I enjoyed our conversation and look forward to speaking with you again next year. I, as well as your care team,  appreciate your ongoing commitment to your health goals. Please review the following plan we discussed and let me know if I can assist you in the future.  Your Game plan/ To Do List     Referrals: If you haven't heard from the office you've been referred to, please reach out to them at the phone number provided.         An order for a mammogram and bone density scan were placed for you today.        Please call the number below to schedule your appt.        [facility]         Phone: [phone] Make sure to wear two-piece clothing.  If you're having a mammogram, please remember: No lotions,powders, or deodorants the day of the appointment Make sure to bring picture ID and insurance card.  Bring list of medications you are currently taking including any supplements.   Now you can schedule your mammogram through your mychart! Log into your MyChart account. Go to 'Visit' (or 'Appointments' if on mobile App) --> Schedule an Appointment Under 'Select a Reason for Visit' choose the Mammogram Screening option.      Complete the pre-visit questions and select the time and place that best fits your schedule I have reached out to your plastic surgeon to check and see how long you have to wait to have your screening mammogram after your breast reduction surgery. I will be back in touch with you with an answer.   Follow up Visits:  Next Medicare AWV with our clinical staff:   Sep 12, 2024 at 8:00 am video visit  Have you seen your provider in the last 6 months (3 months if uncontrolled diabetes)? Yes  Next Office Visit with your provider:   October 14, 2023 at 1:40 pm with Dr. Rodolph Clap  Clinician Recommendations:    Aim for 30 minutes of exercise or  brisk walking, 6-8 glasses of water , and 5 servings of fruits and vegetables each day.   I enjoyed our conversation today and look forward to talking with you again next year!! Have a wonderful and safe year. All the best, Jesslynn Kruck      This is a list of the screening recommended for you and due dates:  Health Maintenance  Topic Date Due   DEXA scan (bone density measurement)  02/12/2023   COVID-19 Vaccine (4 - 2024-25 season) 06/18/2023   Mammogram  08/16/2023   Colon Cancer Screening  10/23/2023   Flu Shot  11/25/2023   Medicare Annual Wellness Visit  09/11/2024   DTaP/Tdap/Td vaccine (3 - Td or Tdap) 12/15/2032   Pneumonia Vaccine  Completed   Hepatitis C Screening  Completed   Zoster (Shingles) Vaccine  Completed   HPV Vaccine  Aged Out   Meningitis B Vaccine  Aged Out    Advanced directives: (Declined) Advance directive discussed with you today. Even though you declined this today, please call our office should you change your mind, and we can give you the proper paperwork for you to fill out. Advance Care Planning is important because it:  [x]  Makes sure you receive the medical care that is consistent with your values, goals, and  preferences  [x]  It provides guidance to your family and loved ones and reduces their decisional burden about whether or not they are making the right decisions based on your wishes.  Follow the link provided in your after visit summary or read over the paperwork we have mailed to you to help you started getting your Advance Directives in place. If you need assistance in completing these, please reach out to us  so that we can help you!  See attachments for Preventive Care and Fall Prevention Tips.   Understanding Your Risk for Falls  Millions of people have serious injuries from falls each year. It is important to understand your risk of falling. Talk with your health care provider about your risk and what you can do to lower it. If you do have a serious  fall, make sure to tell your provider. Falling once raises your risk of falling again. How can falls affect me? Serious injuries from falls are common. These include: Broken bones, such as hip fractures. Head injuries, such as traumatic brain injuries (TBI) or concussions. A fear of falling can cause you to avoid activities and stay at home. This can make your muscles weaker and raise your risk for a fall. What can increase my risk? There are a number of risk factors that increase your risk for falling. The more risk factors you have, the higher your risk of falling. Serious injuries from a fall happen most often to people who are older than 76 years old. Teenagers and young adults ages 57-29 are also at higher risk. Common risk factors include: Weakness in the lower body. Being generally weak or confused due to long-term (chronic) illness. Dizziness or balance problems. Poor vision. Medicines that cause dizziness or drowsiness. These may include: Medicines for your blood pressure, heart, anxiety, insomnia, or swelling (edema). Pain medicines. Muscle relaxants. Other risk factors include: Drinking alcohol. Having had a fall in the past. Having foot pain or wearing improper footwear. Working at a dangerous job. Having any of the following in your home: Tripping hazards, such as floor clutter or loose rugs. Poor lighting. Pets. Having dementia or memory loss. What actions can I take to lower my risk of falling?     Physical activity Stay physically fit. Do strength and balance exercises. Consider taking a regular class to build strength and balance. Yoga and tai chi are good options. Vision Have your eyes checked every year and your prescription for glasses or contacts updated as needed. Shoes and walking aids Wear non-skid shoes. Wear shoes that have rubber soles and low heels. Do not wear high heels. Do not walk around the house in socks or slippers. Use a cane or walker as  told by your provider. Home safety Attach secure railings on both sides of your stairs. Install grab bars for your bathtub, shower, and toilet. Use a non-skid mat in your bathtub or shower. Attach bath mats securely with double-sided, non-slip rug tape. Use good lighting in all rooms. Keep a flashlight near your bed. Make sure there is a clear path from your bed to the bathroom. Use night-lights. Do not use throw rugs. Make sure all carpeting is taped or tacked down securely. Remove all clutter from walkways and stairways, including extension cords. Repair uneven or broken steps and floors. Avoid walking on icy or slippery surfaces. Walk on the grass instead of on icy or slick sidewalks. Use ice melter to get rid of ice on walkways in the winter. Use a cordless  phone. Questions to ask your health care provider Can you help me check my risk for a fall? Do any of my medicines make me more likely to fall? Should I take a vitamin D  supplement? What exercises can I do to improve my strength and balance? Should I make an appointment to have my vision checked? Do I need a bone density test to check for weak bones (osteoporosis)? Would it help to use a cane or a walker? Where to find more information Centers for Disease Control and Prevention, STEAD[employer]: TonerPromos.no Community-Based Fall Prevention Programs: TonerPromos.no [employer] on Aging: BaseRingTones.pl Contact a health care provider if: You fall at home. You are afraid of falling at home. You feel weak, drowsy, or dizzy. This information is not intended to replace advice given to you by your health care provider. Make sure you discuss any questions you have with your health care provider. Document Revised: 12/14/2021 Document Reviewed: 12/14/2021 Elsevier Patient Education  2024 [employer]. Understanding Your Risk for Falls Millions of people have serious injuries from falls each year. It is important to understand your risk of falling. Talk  with your health care provider about your risk and what you can do to lower it. If you do have a serious fall, make sure to tell your provider. Falling once raises your risk of falling again. How can falls affect me? Serious injuries from falls are common. These include: Broken bones, such as hip fractures. Head injuries, such as traumatic brain injuries (TBI) or concussions. A fear of falling can cause you to avoid activities and stay at home. This can make your muscles weaker and raise your risk for a fall. What can increase my risk? There are a number of risk factors that increase your risk for falling. The more risk factors you have, the higher your risk of falling. Serious injuries from a fall happen most often to people who are older than 76 years old. Teenagers and young adults ages 74-29 are also at higher risk. Common risk factors include: Weakness in the lower body. Being generally weak or confused due to long-term (chronic) illness. Dizziness or balance problems. Poor vision. Medicines that cause dizziness or drowsiness. These may include: Medicines for your blood pressure, heart, anxiety, insomnia, or swelling (edema). Pain medicines. Muscle relaxants. Other risk factors include: Drinking alcohol. Having had a fall in the past. Having foot pain or wearing improper footwear. Working at a dangerous job. Having any of the following in your home: Tripping hazards, such as floor clutter or loose rugs. Poor lighting. Pets. Having dementia or memory loss. What actions can I take to lower my risk of falling?     Physical activity Stay physically fit. Do strength and balance exercises. Consider taking a regular class to build strength and balance. Yoga and tai chi are good options. Vision Have your eyes checked every year and your prescription for glasses or contacts updated as needed. Shoes and walking aids Wear non-skid shoes. Wear shoes that have rubber soles and low  heels. Do not wear high heels. Do not walk around the house in socks or slippers. Use a cane or walker as told by your provider. Home safety Attach secure railings on both sides of your stairs. Install grab bars for your bathtub, shower, and toilet. Use a non-skid mat in your bathtub or shower. Attach bath mats securely with double-sided, non-slip rug tape. Use good lighting in all rooms. Keep a flashlight near your bed. Make sure there is a  clear path from your bed to the bathroom. Use night-lights. Do not use throw rugs. Make sure all carpeting is taped or tacked down securely. Remove all clutter from walkways and stairways, including extension cords. Repair uneven or broken steps and floors. Avoid walking on icy or slippery surfaces. Walk on the grass instead of on icy or slick sidewalks. Use ice melter to get rid of ice on walkways in the winter. Use a cordless phone. Questions to ask your health care provider Can you help me check my risk for a fall? Do any of my medicines make me more likely to fall? Should I take a vitamin D  supplement? What exercises can I do to improve my strength and balance? Should I make an appointment to have my vision checked? Do I need a bone density test to check for weak bones (osteoporosis)? Would it help to use a cane or a walker? Where to find more information Centers for Disease Control and Prevention, STEAD[employer]: TonerPromos.no Community-Based Fall Prevention Programs: TonerPromos.no [employer] on Aging: BaseRingTones.pl Contact a health care provider if: You fall at home. You are afraid of falling at home. You feel weak, drowsy, or dizzy. This information is not intended to replace advice given to you by your health care provider. Make sure you discuss any questions you have with your health care provider. Document Revised: 12/14/2021 Document Reviewed: 12/14/2021 Elsevier Patient Education  2024 [employer].

## 2023-09-12 NOTE — Telephone Encounter (Signed)
 [name] would like to know for Adrienne [name], how long after a surgery does a she have to wait to get a Mammo? You can also send a secure message or teams message

## 2023-09-12 NOTE — Progress Notes (Signed)
 Subjective:   Adrienne [name] is a 78 y.o. who presents for a Medicare Wellness preventive visit.  As a reminder, Annual Wellness Visits don't include a physical exam, and some assessments may be limited, especially if this visit is performed virtually. We may recommend an in-person follow-up visit with your provider if needed.  Visit Complete: Virtual I connected with  Adrienne [name] on 09/12/23 by a audio enabled telemedicine application and verified that I am speaking with the correct person using two identifiers.  Patient Location: Home  Provider Location: Home Office  I discussed the limitations of evaluation and management by telemedicine. The patient expressed understanding and agreed to proceed.  Vital Signs: Because this visit was a virtual/telehealth visit, some criteria may be missing or patient reported. Any vitals not documented were not able to be obtained and vitals that have been documented are patient reported.  VideoDeclined- This patient declined Librarian, academic. Therefore the visit was completed with audio only.  Persons Participating in Visit: Patient.  AWV Questionnaire: Yes: Patient Medicare AWV questionnaire was completed by the patient on 09/12/2023; I have confirmed that all information answered by patient is correct and no changes since this date.  Cardiac Risk Factors include: advanced age (>2men, >63 women);dyslipidemia;hypertension     Objective:     Today's Vitals   09/12/23 1340  Weight: 158 lb (71.7 kg)  Height: 5\' 5"  (1.651 m)   Body mass index is 26.29 kg/m.     09/12/2023    1:54 PM 05/11/2023    7:14 AM 05/04/2023    3:30 PM 10/25/2022    2:27 PM 09/07/2022    1:47 PM 08/31/2021    1:08 PM 07/14/2021    1:11 PM  Advanced Directives  Does Patient Have a Medical Advance Directive? No No No No No No No  Would patient like information on creating a medical advance directive? No - Patient declined No - Patient  declined No - Patient declined   No - Patient declined No - Patient declined    Current Medications (verified) Outpatient Encounter Medications as of 09/12/2023  Medication Sig   acetaminophen  (TYLENOL ) 650 MG CR tablet Take 2 tablets (1,300 mg total) by mouth daily as needed for pain.   amLODipine  (NORVASC ) 2.5 MG tablet TAKE 1 TABLET (2.5 MG TOTAL) BY MOUTH DAILY.   Biotin 10 MG CAPS Take 10 mg by mouth every evening.   BOOSTRIX 5-2.5-18.5 LF-MCG/0.5 injection    dorzolamide-timolol (COSOPT) 2-0.5 % ophthalmic solution 1 drop 2 (two) times daily.   guaiFENesin  (MUCINEX ) 600 MG 12 hr tablet Take 1 tablet (600 mg total) by mouth 2 (two) times daily as needed.   ibuprofen  (ADVIL ) 600 MG tablet Take 1 tablet (600 mg total) by mouth every 6 (six) hours as needed for moderate pain (pain score 4-6). For use AFTER surgery   Liniments (BLUE-EMU SUPER STRENGTH EX) Apply 1 application. topically 4 (four) times daily as needed (pain.).   loratadine (CLARITIN) 10 MG tablet Take 10 mg by mouth daily as needed for allergies.   Menthol, Topical Analgesic, (ICE BLUE EX) Apply 1 application. topically 4 (four) times daily as needed (pain.).   Menthol-Methyl Salicylate (MUSCLE RUB) 10-15 % CREA Apply 1 application. topically as needed for muscle pain.   Multiple Minerals-Vitamins (CAL MAG ZINC +D3 PO) Take 1 tablet by mouth every evening.   Multiple Vitamin (MULTIVITAMIN WITH MINERALS) TABS tablet Take 1 tablet by mouth every evening. Women's Multivitamin 50+   ondansetron  (  ZOFRAN ) 4 MG tablet Take 1 tablet (4 mg total) by mouth every 8 (eight) hours as needed for nausea or vomiting.   Propylene Glycol (SYSTANE BALANCE OP) Apply to eye.   triamterene -hydrochlorothiazide  (MAXZIDE-25) 37.5-25 MG tablet TAKE 1 TABLET EVERY DAY   TURMERIC PO Take 450 mg by mouth every evening.   No facility-administered encounter medications on file as of 09/12/2023.    Allergies (verified) Valsartan  and Ace inhibitors    History: Past Medical History:  Diagnosis Date   Allergy    Arthritis    low back   Cataract    Diverticulosis 05/2009   Dyspnea    mostly with exertion   Encounter for immunization 04/17/2023   GERD (gastroesophageal reflux disease)    Glaucoma    Hyperlipidemia    Hypertension    Pre-diabetes    Past Surgical History:  Procedure Laterality Date   ABDOMINAL HYSTERECTOMY  04/26/1988   for fibroids ovaries remain    BREAST REDUCTION SURGERY Bilateral 05/11/2023   Procedure: BREAST REDUCTION WITH LIPOSUCTION;  Surgeon: Thornell Flirt, DO;  Location: [facility];  Service: Plastics;  Laterality: Bilateral;   CHOLECYSTECTOMY  04/26/1978   COLONOSCOPY  05/27/2009   rare diverticula, small internal hemorrhoids. Next colonoscopy in 5-10 yrs.   COLONOSCOPY N/A 10/23/2018   Procedure: COLONOSCOPY;  Surgeon: Alyce Jubilee, MD;  Location: [facility];  Service: Endoscopy;  Laterality: N/A;  2:00pm   JOINT REPLACEMENT N/A    [employer]reesia 06/02/2020   left nipple inversion bx benign  04/26/1998   POLYPECTOMY  10/23/2018   Procedure: POLYPECTOMY;  Surgeon: Alyce Jubilee, MD;  Location: [facility];  Service: Endoscopy;;   TOTAL HIP ARTHROPLASTY Right 05/16/2020   Procedure: TOTAL HIP ARTHROPLASTY;  Surgeon: Marlena Sima, MD;  Location: [facility];  Service: Orthopedics;  Laterality: Right;   Family History  Problem Relation Age of Onset   Kidney failure Father    Arthritis Father    Cancer Father    Heart disease Sister    Hypertension Sister    Arthritis Sister    Breast cancer Sister    Diabetes Sister    Arthritis Sister    Lung cancer Brother        was a smoker   Arthritis Brother    Diabetes Brother    Heart disease Brother    Hypertension Brother    Arthritis Mother    Heart disease Mother    Hypertension Mother    Prostate cancer Brother    Colon cancer Neg Hx    Social History   Socioeconomic History   Marital status: Single     Spouse name: Not on file   Number of children: 0   Years of education: Not on file   Highest education level: Bachelor's degree (e.g., BA, AB, BS)  Occupational History   Occupation: retired    Associate Professor: RETIRED  Tobacco Use   Smoking status: Never   Smokeless tobacco: Never  Vaping Use   Vaping status: Never Used  Substance and Sexual Activity   Alcohol use: Not Currently    Comment: rarely    Drug use: No   Sexual activity: Not Currently    Birth control/protection: Surgical    Comment: hyst  Other Topics Concern   Not on file  Social History Narrative   Not on file   Social Drivers of Health   Financial Resource Strain: Low Risk  (09/12/2023)   Overall Financial Resource Strain (CARDIA)  Difficulty of Paying Living Expenses: Not hard at all  Food Insecurity: No Food Insecurity (09/12/2023)   Hunger Vital Sign    Worried About Running Out of Food in the Last Year: Never true    Ran Out of Food in the Last Year: Never true  Transportation Needs: No Transportation Needs (09/12/2023)   PRAPARE - Administrator, Civil Service (Medical): No    Lack of Transportation (Non-Medical): No  Physical Activity: Sufficiently Active (09/12/2023)   Exercise Vital Sign    Days of Exercise per Week: 7 days    Minutes of Exercise per Session: 30 min  Stress: No Stress Concern Present (09/12/2023)   [employer] of Occupational Health - Occupational Stress Questionnaire    Feeling of Stress : Not at all  Social Connections: Socially Integrated (09/12/2023)   Social Connection and Isolation Panel [NHANES]    Frequency of Communication with Friends and Family: More than three times a week    Frequency of Social Gatherings with Friends and Family: More than three times a week    Attends Religious Services: More than 4 times per year    Active Member of [employer] or Organizations: Yes    Attends Engineer, structural: More than 4 times per year    Marital Status: Married     Tobacco Counseling Counseling given: Yes    Clinical Intake:  Pre-visit preparation completed: Yes  Pain : No/denies pain     BMI - recorded: 26.29 Nutritional Status: BMI 25 -29 Overweight Nutritional Risks: None Diabetes: No  Lab Results  Component Value Date   HGBA1C 5.8 (H) 04/12/2023   HGBA1C 5.8 (H) 09/03/2022   HGBA1C 5.9 (H) 03/08/2022     How often do you need to have someone help you when you read instructions, pamphlets, or other written materials from your doctor or pharmacy?: 1 - Never  Interpreter Needed?: No  Information entered by :: Sally Crazier CMA   Activities of Daily Living     09/12/2023   11:59 AM 05/11/2023    7:20 AM  In your present state of health, do you have any difficulty performing the following activities:  Hearing? 0 0  Vision? 0 0  Difficulty concentrating or making decisions? 0 0  Walking or climbing stairs? 0   Dressing or bathing? 0   Doing errands, shopping? 0   Preparing Food and eating ? N   Using the Toilet? N   In the past six months, have you accidently leaked urine? N   Do you have problems with loss of bowel control? N   Managing your Medications? N   Managing your Finances? N   Housekeeping or managing your Housekeeping? N     Patient Care Team: Towanda Fret, MD as PCP - General Londa Rival Davida Espy, MD as Consulting Physician (Cardiology) Cindra Cree, MD as Consulting Physician (Ophthalmology) Scheeler, Janalyn Me, PA-C as Consulting Physician (Plastic Surgery)  Indicate any recent Medical Services you may have received from other than [facility] providers in the past year (date may be approximate).     Assessment:    This is a routine wellness examination for [place].  Hearing/Vision screen Hearing Screening - Comments:: Patient denies any hearing difficulties.    Vision Screening - Comments:: Wears rx glasses - up to date with routine eye exams  Patient sees Dr. Cindra Cree    Goals Addressed  This Visit's Progress     Patient Stated (pt-stated)   On track     Increase exercise to 30 minutes a day x 5 days a week. She line dances 90 minutes one time per week.        Depression Screen     09/12/2023    1:54 PM 04/15/2023    2:46 PM 09/10/2022    1:06 PM 09/07/2022    1:48 PM 03/10/2022    1:05 PM 08/31/2021    1:08 PM 08/31/2021    1:04 PM  PHQ 2/9 Scores  PHQ - 2 Score 0 0 0 0 0 0 0  PHQ- 9 Score 0          Fall Risk     09/12/2023   11:59 AM 04/15/2023    2:46 PM 09/10/2022    1:06 PM 09/07/2022    1:48 PM 09/07/2022   12:22 AM  Fall Risk   Falls in the past year? 0 0 0 0 0  Number falls in past yr: 0 0 0    Injury with Fall? 0 0 0  0  Risk for fall due to :  No Fall Risks No Fall Risks    Follow up  Falls evaluation completed Falls evaluation completed      MEDICARE RISK AT HOME:  Medicare Risk at Home Any stairs in or around the home?: (Patient-Rptd) Yes If so, are there any without handrails?: (Patient-Rptd) Yes Home free of loose throw rugs in walkways, pet beds, electrical cords, etc?: (Patient-Rptd) No Adequate lighting in your home to reduce risk of falls?: (Patient-Rptd) Yes Life alert?: (Patient-Rptd) No Use of a cane, walker or w/c?: (Patient-Rptd) No Grab bars in the bathroom?: (Patient-Rptd) Yes Shower chair or bench in shower?: (Patient-Rptd) Yes Elevated toilet seat or a handicapped toilet?: (Patient-Rptd) Yes  TIMED UP AND GO:  Was the test performed?  No  Cognitive Function: 6CIT completed    08/31/2021    1:10 PM  MMSE - Mini Mental State Exam  Not completed: Unable to complete        09/12/2023    1:54 PM 09/07/2022    1:50 PM 08/31/2021    1:10 PM 08/26/2020   11:25 AM 08/20/2019   11:33 AM  6CIT Screen  What Year? 0 points 0 points 0 points 0 points 0 points  What month? 0 points 0 points 0 points 0 points 0 points  What time? 0 points 0 points 0 points 0 points 0 points  Count back from 20 0 points 0 points 0  points 0 points 0 points  Months in reverse 0 points 0 points 0 points 0 points 0 points  Repeat phrase 0 points 0 points 0 points 0 points 0 points  Total Score 0 points 0 points 0 points 0 points 0 points    Immunizations Immunization History  Administered Date(s) Administered   Fluad Quad(high Dose 65+) 01/15/2019, 02/05/2020, 02/03/2021, 04/27/2022   Fluad Trivalent(High Dose 65+) 04/15/2023   Influenza Split 02/04/2011   Influenza Whole 04/03/2009, 01/01/2010   Influenza, High Dose Seasonal PF 03/28/2018   Influenza,inj,Quad PF,6+ Mos 03/20/2013, 01/24/2014, 02/24/2015, 02/03/2016, 03/23/2017   MODERNA COVID-19 SARS-COV-2 PEDS BIVALENT BOOSTER 13yr-10yr 04/23/2023   Moderna SARS-COV2 Booster Vaccination 04/10/2020, 08/27/2020   Moderna Sars-Covid-2 Vaccination 06/13/2019, 07/11/2019   PNEUMOCOCCAL CONJUGATE-20 05/06/2023   Pneumococcal Conjugate-13 07/03/2014   Pneumococcal Polysaccharide-23 03/20/2013   Td 04/03/2009   Tdap 12/16/2022   Zoster Recombinant(Shingrix) 08/09/2021, 03/08/2022   Zoster,  Live 02/04/2011    Screening Tests Health Maintenance  Topic Date Due   DEXA SCAN  02/12/2023   COVID-19 Vaccine (4 - 2024-25 season) 06/18/2023   MAMMOGRAM  08/16/2023   Colonoscopy  10/23/2023   INFLUENZA VACCINE  11/25/2023   Medicare Annual Wellness (AWV)  09/11/2024   DTaP/Tdap/Td (3 - Td or Tdap) 12/15/2032   Pneumonia Vaccine 7+ Years old  Completed   Hepatitis C Screening  Completed   Zoster Vaccines- Shingrix  Completed   HPV VACCINES  Aged Out   Meningococcal B Vaccine  Aged Out    Health Maintenance  Health Maintenance Due  Topic Date Due   DEXA SCAN  02/12/2023   COVID-19 Vaccine (4 - 2024-25 season) 06/18/2023   MAMMOGRAM  08/16/2023   Colonoscopy  10/23/2023   Health Maintenance Items Addressed: Mammogram ordered, DEXA ordered, Referral sent to GI for colonoscopy  Additional Screening:  Vision Screening: Recommended annual ophthalmology exams for  early detection of glaucoma and other disorders of the eye.  Dental Screening: Recommended annual dental exams for proper oral hygiene  Community Resource Referral / Chronic Care Management: CRR required this visit?  No   CCM required this visit?  No   Plan:    I have personally reviewed and noted the following in the patient's chart:   Medical and social history Use of alcohol, tobacco or illicit drugs  Current medications and supplements including opioid prescriptions. Patient is not currently taking opioid prescriptions. Functional ability and status Nutritional status Physical activity Advanced directives List of other physicians Hospitalizations, surgeries, and ER visits in previous 12 months Vitals Screenings to include cognitive, depression, and falls Referrals and appointments  In addition, I have reviewed and discussed with patient certain preventive protocols, quality metrics, and best practice recommendations. A written personalized care plan for preventive services as well as general preventive health recommendations were provided to patient.   Saranda Legrande, CMA   09/12/2023   After Visit Summary: (MyChart) Due to this being a telephonic visit, the after visit summary with patients personalized plan was offered to patient via MyChart   Notes: Please refer to Routing Comments.

## 2023-09-13 NOTE — Telephone Encounter (Signed)
 Hey, sent message to [name] CMA with family medicine and let her know that patient may receive mammogram 6 months postoperatively

## 2023-09-29 ENCOUNTER — Telehealth: Payer: Self-pay | Admitting: Plastic Surgery

## 2023-09-29 NOTE — Telephone Encounter (Signed)
 Pt called asking if it was safe to have a mammo no if she wanted to. Her sx was back in 04-2023 and she got a notification that her yearly is coming p and she wanted to make sure it was ok.

## 2023-10-11 ENCOUNTER — Telehealth: Payer: Self-pay | Admitting: Surgical

## 2023-10-11 NOTE — Telephone Encounter (Signed)
 How long does she need to wait to get a mammo after having sx. She is due for a mammo she stated and wants to know when it will be safe.

## 2023-10-12 DIAGNOSIS — R7303 Prediabetes: Secondary | ICD-10-CM | POA: Diagnosis not present

## 2023-10-12 DIAGNOSIS — I10 Essential (primary) hypertension: Secondary | ICD-10-CM | POA: Diagnosis not present

## 2023-10-12 DIAGNOSIS — E559 Vitamin D deficiency, unspecified: Secondary | ICD-10-CM | POA: Diagnosis not present

## 2023-10-12 DIAGNOSIS — E785 Hyperlipidemia, unspecified: Secondary | ICD-10-CM | POA: Diagnosis not present

## 2023-10-13 ENCOUNTER — Ambulatory Visit: Payer: Self-pay | Admitting: Family Medicine

## 2023-10-13 LAB — CMP14+EGFR
ALT: 30 IU/L (ref 0–32)
AST: 26 IU/L (ref 0–40)
Albumin: 4 g/dL (ref 3.8–4.8)
Alkaline Phosphatase: 111 IU/L (ref 44–121)
BUN/Creatinine Ratio: 17 (ref 12–28)
BUN: 13 mg/dL (ref 8–27)
Bilirubin Total: 0.4 mg/dL (ref 0.0–1.2)
CO2: 24 mmol/L (ref 20–29)
Calcium: 10.1 mg/dL (ref 8.7–10.3)
Chloride: 104 mmol/L (ref 96–106)
Creatinine, Ser: 0.77 mg/dL (ref 0.57–1.00)
Globulin, Total: 2.8 g/dL (ref 1.5–4.5)
Glucose: 84 mg/dL (ref 70–99)
Potassium: 4.4 mmol/L (ref 3.5–5.2)
Sodium: 141 mmol/L (ref 134–144)
Total Protein: 6.8 g/dL (ref 6.0–8.5)
eGFR: 80 mL/min/{1.73_m2} (ref >59)

## 2023-10-13 LAB — LIPID PANEL
Chol/HDL Ratio: 2.6 ratio (ref 0.0–4.4)
Cholesterol, Total: 235 mg/dL — ABNORMAL HIGH (ref 100–199)
HDL: 89 mg/dL (ref >39)
LDL Chol Calc (NIH): 125 mg/dL — ABNORMAL HIGH (ref 0–99)
Triglycerides: 125 mg/dL (ref 0–149)
VLDL Cholesterol Cal: 21 mg/dL (ref 5–40)

## 2023-10-13 LAB — CBC
Hematocrit: 39.5 % (ref 34.0–46.6)
Hemoglobin: 12.5 g/dL (ref 11.1–15.9)
MCH: 29.6 pg (ref 26.6–33.0)
MCHC: 31.6 g/dL (ref 31.5–35.7)
MCV: 94 fL (ref 79–97)
Platelets: 260 10*3/uL (ref 150–450)
RBC: 4.22 x10E6/uL (ref 3.77–5.28)
RDW: 13.3 % (ref 11.7–15.4)
WBC: 6.6 10*3/uL (ref 3.4–10.8)

## 2023-10-13 LAB — TSH: TSH: 1.66 u[IU]/mL (ref 0.450–4.500)

## 2023-10-13 LAB — HEMOGLOBIN A1C
Est. average glucose Bld gHb Est-mCnc: 126 mg/dL
Hgb A1c MFr Bld: 6 % — ABNORMAL HIGH (ref 4.8–5.6)

## 2023-10-13 LAB — VITAMIN D 25 HYDROXY (VIT D DEFICIENCY, FRACTURES): Vit D, 25-Hydroxy: 49.4 ng/mL (ref 30.0–100.0)

## 2023-10-14 ENCOUNTER — Encounter: Payer: Self-pay | Admitting: Family Medicine

## 2023-10-14 ENCOUNTER — Ambulatory Visit (INDEPENDENT_AMBULATORY_CARE_PROVIDER_SITE_OTHER): Payer: Medicare Other | Admitting: Family Medicine

## 2023-10-14 ENCOUNTER — Other Ambulatory Visit (HOSPITAL_COMMUNITY): Payer: Self-pay | Admitting: Family Medicine

## 2023-10-14 ENCOUNTER — Other Ambulatory Visit: Payer: Self-pay | Admitting: Family Medicine

## 2023-10-14 VITALS — BP 114/71 | HR 71 | Resp 16 | Ht 65.5 in | Wt 158.0 lb

## 2023-10-14 DIAGNOSIS — R7303 Prediabetes: Secondary | ICD-10-CM

## 2023-10-14 DIAGNOSIS — E559 Vitamin D deficiency, unspecified: Secondary | ICD-10-CM | POA: Diagnosis not present

## 2023-10-14 DIAGNOSIS — H409 Unspecified glaucoma: Secondary | ICD-10-CM | POA: Diagnosis not present

## 2023-10-14 DIAGNOSIS — M546 Pain in thoracic spine: Secondary | ICD-10-CM

## 2023-10-14 DIAGNOSIS — G8929 Other chronic pain: Secondary | ICD-10-CM | POA: Diagnosis not present

## 2023-10-14 DIAGNOSIS — Z1231 Encounter for screening mammogram for malignant neoplasm of breast: Secondary | ICD-10-CM

## 2023-10-14 DIAGNOSIS — E785 Hyperlipidemia, unspecified: Secondary | ICD-10-CM

## 2023-10-14 DIAGNOSIS — I10 Essential (primary) hypertension: Secondary | ICD-10-CM | POA: Diagnosis not present

## 2023-10-14 NOTE — Assessment & Plan Note (Signed)
 Controlled, no change in medication DASH diet and commitment to daily physical activity for a minimum of 30 minutes discussed and encouraged, as a part of hypertension management. The importance of attaining a healthy weight is also discussed.     10/14/2023    1:38 PM 09/12/2023    1:40 PM 06/03/2023    1:14 PM 05/20/2023    1:20 PM 05/11/2023   12:22 PM 05/11/2023   11:45 AM 05/11/2023   11:30 AM  BP/Weight  Systolic BP 114 -- 142 153 138 147 139  Diastolic BP 71 -- 75 90 81 89 79  Wt. (Lbs) 158.04 158  154     BMI 25.9 kg/m2 26.29 kg/m2  25.63 kg/m2

## 2023-10-14 NOTE — Assessment & Plan Note (Signed)
Improved following breast reduction

## 2023-10-14 NOTE — Assessment & Plan Note (Signed)
 Patient educated about the importance of limiting  Carbohydrate intake , the need to commit to daily physical activity for a minimum of 30 minutes , and to commit weight loss. The fact that changes in all these areas will reduce or eliminate all together the development of diabetes is stressed.      Latest Ref Rng & Units 10/12/2023   10:39 AM 04/12/2023    1:37 PM 09/03/2022   10:56 AM 03/08/2022   10:14 AM 08/03/2021    1:27 PM  Diabetic Labs  HbA1c 4.8 - 5.6 % 6.0  5.8  5.8  5.9  5.8   Chol 100 - 199 mg/dL 102  725  366  440  347   HDL >39 mg/dL 89  425  97  90  956   Calc LDL 0 - 99 mg/dL 387  564  332  951  884   Triglycerides 0 - 149 mg/dL 166  75  063  70  75   Creatinine 0.57 - 1.00 mg/dL 0.16  0.10  9.32  3.55  1.03       10/14/2023    1:38 PM 09/12/2023    1:40 PM 06/03/2023    1:14 PM 05/20/2023    1:20 PM 05/11/2023   12:22 PM 05/11/2023   11:45 AM 05/11/2023   11:30 AM  BP/Weight  Systolic BP 114 -- 142 153 138 147 139  Diastolic BP 71 -- 75 90 81 89 79  Wt. (Lbs) 158.04 158  154     BMI 25.9 kg/m2 26.29 kg/m2  25.63 kg/m2          No data to display          Deteriorated  Updated lab needed at/ before next visit.

## 2023-10-14 NOTE — Assessment & Plan Note (Signed)
 Hyperlipidemia:Low fat diet discussed and encouraged.   Lipid Panel  Lab Results  Component Value Date   CHOL 235 (H) 10/12/2023   HDL 89 10/12/2023   LDLCALC 125 (H) 10/12/2023   TRIG 125 10/12/2023   CHOLHDL 2.6 10/12/2023     Improved which is great

## 2023-10-14 NOTE — Assessment & Plan Note (Signed)
 Corrected on current meds

## 2023-10-14 NOTE — Patient Instructions (Addendum)
   Pls schedule mammogram and dexa  in July at checkout   F/U end November or early December, callif you needme sooner  Fasting lipid, cmp and EGFr, HBA1C to be drawn 3 to 5 days before next visit   Keep up good work, cholesterol MUCH improved, please reduce  chips!!, sugar  is much better.

## 2023-10-14 NOTE — Assessment & Plan Note (Signed)
 Actively managed by Opthalmology

## 2023-10-14 NOTE — Progress Notes (Signed)
 Neila Teem     MRN: [id number]      DOB: 12-29-1945  Chief Complaint  Patient presents with   Hypertension    6 month follow up     HPI Ms. Biswas is here for follow up and re-evaluation of chronic medical conditions, medication management and review of any available recent lab and radiology data.  Preventive health is updated, specifically  Cancer screening and Immunization.   Questions or concerns regarding consultations or procedures which the PT has had in the interim are  addressed. The PT denies any adverse reactions to current medications since the last visit.  There are no new concerns.  There are no specific complaints   ROS Denies recent fever or chills. Denies sinus pressure, nasal congestion, ear pain or sore throat. Denies chest congestion, productive cough or wheezing. Denies chest pains, palpitations and leg swelling Denies abdominal pain, nausea, vomiting,diarrhea or constipation.   Denies dysuria, frequency, hesitancy or incontinence. s joint pain, swelling and limitation in mobility. Denies headaches, seizures, numbness, or tingling. Denies depression, anxiety or insomnia. Denies skin break down or rash.   PE  BP 114/71   Pulse 71   Resp 16   Ht 5' 5.5 (1.664 m)   Wt 158 lb 0.6 oz (71.7 kg)   SpO2 97%   BMI 25.90 kg/m   Patient alert and oriented and in no cardiopulmonary distress.  HEENT: No facial asymmetry, EOMI,     Neck supple .  Chest: Clear to auscultation bilaterally.  CVS: S1, S2 no murmurs, no S3.Regular rate.  ABD: Soft non tender.   Ext: No edema  MS: Adequate ROM spine, shoulders, hips and knees.  Skin: Intact, no ulcerations or rash noted.  Psych: Good eye contact, normal affect. Memory intact not anxious or depressed appearing.  CNS: CN 2-12 intact, power,  normal throughout.no focal deficits noted.   Assessment & Plan  Essential hypertension Controlled, no change in medication DASH diet and commitment to daily  physical activity for a minimum of 30 minutes discussed and encouraged, as a part of hypertension management. The importance of attaining a healthy weight is also discussed.     10/14/2023    1:38 PM 09/12/2023    1:40 PM 06/03/2023    1:14 PM 05/20/2023    1:20 PM 05/11/2023   12:22 PM 05/11/2023   11:45 AM 05/11/2023   11:30 AM  BP/Weight  Systolic BP 114 -- 142 153 138 147 139  Diastolic BP 71 -- 75 90 81 89 79  Wt. (Lbs) 158.04 158  154     BMI 25.9 kg/m2 26.29 kg/m2  25.63 kg/m2          Hyperlipidemia LDL goal <100 Hyperlipidemia:Low fat diet discussed and encouraged.   Lipid Panel  Lab Results  Component Value Date   CHOL 235 (H) 10/12/2023   HDL 89 10/12/2023   LDLCALC 125 (H) 10/12/2023   TRIG 125 10/12/2023   CHOLHDL 2.6 10/12/2023     Improved which is great  Prediabetes Patient educated about the importance of limiting  Carbohydrate intake , the need to commit to daily physical activity for a minimum of 30 minutes , and to commit weight loss. The fact that changes in all these areas will reduce or eliminate all together the development of diabetes is stressed.      Latest Ref Rng & Units 10/12/2023   10:39 AM 04/12/2023    1:37 PM 09/03/2022   10:56 AM 03/08/2022  10:14 AM 08/03/2021    1:27 PM  Diabetic Labs  HbA1c 4.8 - 5.6 % 6.0  5.8  5.8  5.9  5.8   Chol 100 - 199 mg/dL 469  629  528  413  244   HDL >39 mg/dL 89  010  97  90  272   Calc LDL 0 - 99 mg/dL 536  644  034  742  595   Triglycerides 0 - 149 mg/dL 638  75  756  70  75   Creatinine 0.57 - 1.00 mg/dL 4.33  2.95  1.88  4.16  1.03       10/14/2023    1:38 PM 09/12/2023    1:40 PM 06/03/2023    1:14 PM 05/20/2023    1:20 PM 05/11/2023   12:22 PM 05/11/2023   11:45 AM 05/11/2023   11:30 AM  BP/Weight  Systolic BP 114 -- 142 153 138 147 139  Diastolic BP 71 -- 75 90 81 89 79  Wt. (Lbs) 158.04 158  154     BMI 25.9 kg/m2 26.29 kg/m2  25.63 kg/m2          No data to display           Deteriorated  Updated lab needed at/ before next visit.   Thoracic back pain Improved following breast reduction  Vitamin D  deficiency Corrected on current meds  Glaucoma Actively managed by Opthalmology

## 2023-10-31 ENCOUNTER — Ambulatory Visit (HOSPITAL_COMMUNITY)
Admission: RE | Admit: 2023-10-31 | Discharge: 2023-10-31 | Disposition: A | Discharge status: Home / Self Care | Source: Ambulatory Visit | Attending: Family Medicine | Admitting: Family Medicine

## 2023-10-31 ENCOUNTER — Encounter (HOSPITAL_COMMUNITY): Payer: Self-pay

## 2023-10-31 ENCOUNTER — Ambulatory Visit: Payer: Self-pay | Admitting: Family Medicine

## 2023-10-31 DIAGNOSIS — Z780 Asymptomatic menopausal state: Secondary | ICD-10-CM

## 2023-10-31 DIAGNOSIS — Z1231 Encounter for screening mammogram for malignant neoplasm of breast: Secondary | ICD-10-CM | POA: Diagnosis not present

## 2023-10-31 DIAGNOSIS — M85852 Other specified disorders of bone density and structure, left thigh: Secondary | ICD-10-CM | POA: Diagnosis not present

## 2023-11-09 ENCOUNTER — Other Ambulatory Visit: Payer: Self-pay | Admitting: *Deleted

## 2023-11-09 ENCOUNTER — Encounter: Payer: Self-pay | Admitting: *Deleted

## 2023-11-09 ENCOUNTER — Ambulatory Visit (INDEPENDENT_AMBULATORY_CARE_PROVIDER_SITE_OTHER): Admitting: Internal Medicine

## 2023-11-09 ENCOUNTER — Encounter: Payer: Self-pay | Admitting: Internal Medicine

## 2023-11-09 VITALS — BP 123/75 | HR 55 | Temp 97.8°F | Ht 65.5 in | Wt 158.7 lb

## 2023-11-09 DIAGNOSIS — Z860100 Personal history of colon polyps, unspecified: Secondary | ICD-10-CM

## 2023-11-09 DIAGNOSIS — Z09 Encounter for follow-up examination after completed treatment for conditions other than malignant neoplasm: Secondary | ICD-10-CM

## 2023-11-09 MED ORDER — PEG 3350-KCL-NA BICARB-NACL 420 G PO SOLR: 4000.0000 mL | Freq: Once | ORAL | 0 refills | Status: AC

## 2023-11-09 NOTE — Addendum Note (Signed)
 Addended by: CINDIE CARLIN POUR on: 11/09/2023 01:47 PM   Modules accepted: Level of Service

## 2023-11-09 NOTE — Patient Instructions (Signed)
 We will schedule you for colonoscopy for surveillance purposes given your history of polyps prior.  I will also evaluate your change in bowel habits as well.  It was very nice meeting you today.  Dr. Cindie

## 2023-11-09 NOTE — Progress Notes (Signed)
 Primary Care Physician:  Antonetta Rollene BRAVO, MD Primary Gastroenterologist:  Dr. Cindie  Chief Complaint  Patient presents with   New Patient (Initial Visit)    Patient here today for a pre procedural visit for TCS. Patient says she has issues at times with diarrhea.    HPI:   Adrienne [name] is a 78 y.o. female who presents to the clinic today by referral from her PCP Dr. Antonetta for evaluation.  Patient reports she is due for a colonoscopy.  Last colonoscopy 10/23/2018 with 1 benign polyp removed.  However she reports a history of precancerous polyps removed prior to this.  Denies any family history of colorectal malignancy.  No melena hematochezia.  No abdominal pain or unintentional weight loss.  Denies any upper GI symptoms including heartburn, reflux, dysphagia/odynophagia, chest pain or epigastric pain.  Past Medical History:  Diagnosis Date   Allergy    Arthritis    low back   Cataract    Diverticulosis 05/2009   Dyspnea    mostly with exertion   Encounter for immunization 04/17/2023   GERD (gastroesophageal reflux disease)    Glaucoma    Hyperlipidemia    Hypertension    Large breasts 09/12/2022   Pre-diabetes     Past Surgical History:  Procedure Laterality Date   ABDOMINAL HYSTERECTOMY  04/26/1988   for fibroids ovaries remain    BREAST REDUCTION SURGERY Bilateral 05/11/2023   Procedure: BREAST REDUCTION WITH LIPOSUCTION;  Surgeon: Lowery Estefana RAMAN, DO;  Location: [facility];  Service: Plastics;  Laterality: Bilateral;   CHOLECYSTECTOMY  04/26/1978   COLONOSCOPY  05/27/2009   rare diverticula, small internal hemorrhoids. Next colonoscopy in 5-10 yrs.   COLONOSCOPY N/A 10/23/2018   Procedure: COLONOSCOPY;  Surgeon: Harvey Margo CROME, MD;  Location: [facility];  Service: Endoscopy;  Laterality: N/A;  2:00pm   JOINT REPLACEMENT N/A    Phreesia 06/02/2020   left nipple inversion bx benign  04/26/1998   POLYPECTOMY  10/23/2018    Procedure: POLYPECTOMY;  Surgeon: Harvey Margo CROME, MD;  Location: [facility];  Service: Endoscopy;;   REDUCTION MAMMAPLASTY Bilateral 05/11/2023   TOTAL HIP ARTHROPLASTY Right 05/16/2020   Procedure: TOTAL HIP ARTHROPLASTY;  Surgeon: Shari Sieving, MD;  Location: [facility];  Service: Orthopedics;  Laterality: Right;    Current Outpatient Medications  Medication Sig Dispense Refill   acetaminophen  (TYLENOL ) 650 MG CR tablet Take 2 tablets (1,300 mg total) by mouth daily as needed for pain. 100 tablet 0   amLODipine  (NORVASC ) 2.5 MG tablet TAKE 1 TABLET (2.5 MG TOTAL) BY MOUTH DAILY. 90 tablet 3   Biotin 10 MG CAPS Take 10 mg by mouth every evening.     dorzolamide-timolol (COSOPT) 2-0.5 % ophthalmic solution 1 drop 2 (two) times daily.     Liniments (BLUE-EMU SUPER STRENGTH EX) Apply 1 application. topically 4 (four) times daily as needed (pain.).     loratadine (CLARITIN) 10 MG tablet Take 10 mg by mouth daily as needed for allergies.     Menthol, Topical Analgesic, (ICE BLUE EX) Apply 1 application. topically 4 (four) times daily as needed (pain.).     Menthol-Methyl Salicylate (MUSCLE RUB) 10-15 % CREA Apply 1 application. topically as needed for muscle pain.     Multiple Minerals-Vitamins (CAL MAG ZINC +D3 PO) Take 1 tablet by mouth every evening.     Multiple Vitamin (MULTIVITAMIN WITH MINERALS) TABS tablet Take 1 tablet by mouth every evening. Women's Multivitamin 50+  ondansetron  (ZOFRAN ) 4 MG tablet Take 1 tablet (4 mg total) by mouth every 8 (eight) hours as needed for nausea or vomiting. 20 tablet 0   Propylene Glycol (SYSTANE BALANCE OP) Apply to eye. (Patient taking differently: Apply to eye daily at 6 (six) AM.)     triamterene -hydrochlorothiazide  (MAXZIDE-25) 37.5-25 MG tablet TAKE 1 TABLET EVERY DAY 90 tablet 3   No current facility-administered medications for this visit.    Allergies as of 11/09/2023 - Review Complete 11/09/2023  Allergen Reaction Noted   Valsartan   Shortness Of Breath 03/20/2013   Ace inhibitors Cough 03/20/2013    Family History  Problem Relation Age of Onset   Kidney failure Father    Arthritis Father    Cancer Father    Heart disease Sister    Hypertension Sister    Arthritis Sister    Breast cancer Sister    Diabetes Sister    Arthritis Sister    Lung cancer Brother        was a smoker   Arthritis Brother    Diabetes Brother    Heart disease Brother    Hypertension Brother    Arthritis Mother    Heart disease Mother    Hypertension Mother    Prostate cancer Brother    Colon cancer Neg Hx     Social History   Socioeconomic History   Marital status: Single    Spouse name: Not on file   Number of children: 0   Years of education: Not on file   Highest education level: Bachelor's degree (e.g., BA, AB, BS)  Occupational History   Occupation: retired    Associate Professor: RETIRED  Tobacco Use   Smoking status: Never   Smokeless tobacco: Never  Vaping Use   Vaping status: Never Used  Substance and Sexual Activity   Alcohol use: Not Currently    Comment: rarely    Drug use: No   Sexual activity: Not Currently    Birth control/protection: Surgical    Comment: hyst  Other Topics Concern   Not on file  Social History Narrative   Not on file   Social Drivers of Health   Financial Resource Strain: Low Risk  (10/13/2023)   Overall Financial Resource Strain (CARDIA)    Difficulty of Paying Living Expenses: Not hard at all  Food Insecurity: No Food Insecurity (10/13/2023)   Hunger Vital Sign    Worried About Running Out of Food in the Last Year: Never true    Ran Out of Food in the Last Year: Never true  Transportation Needs: No Transportation Needs (10/13/2023)   PRAPARE - Administrator, Civil Service (Medical): No    Lack of Transportation (Non-Medical): No  Physical Activity: Sufficiently Active (10/13/2023)   Exercise Vital Sign    Days of Exercise per Week: 3 days    Minutes of Exercise per  Session: 60 min  Stress: No Stress Concern Present (10/13/2023)   [employer] of Occupational Health - Occupational Stress Questionnaire    Feeling of Stress: Only a little  Social Connections: Moderately Integrated (10/13/2023)   Social Connection and Isolation Panel    Frequency of Communication with Friends and Family: More than three times a week    Frequency of Social Gatherings with Friends and Family: More than three times a week    Attends Religious Services: More than 4 times per year    Active Member of [employer] or Organizations: Yes    Attends Banker  Meetings: More than 4 times per year    Marital Status: Never married  Intimate Partner Violence: Not At Risk (09/12/2023)   Humiliation, Afraid, Rape, and Kick questionnaire    Fear of Current or Ex-Partner: No    Emotionally Abused: No    Physically Abused: No    Sexually Abused: No    Subjective: Review of Systems  Constitutional:  Negative for chills and fever.  HENT:  Negative for congestion and hearing loss.   Eyes:  Negative for blurred vision and double vision.  Respiratory:  Negative for cough and shortness of breath.   Cardiovascular:  Negative for chest pain and palpitations.  Gastrointestinal:  Negative for abdominal pain, blood in stool, constipation, diarrhea, heartburn, melena and vomiting.  Genitourinary:  Negative for dysuria and urgency.  Musculoskeletal:  Negative for joint pain and myalgias.  Skin:  Negative for itching and rash.  Neurological:  Negative for dizziness and headaches.  Psychiatric/Behavioral:  Negative for depression. The patient is not nervous/anxious.        Objective: BP 123/75 (BP Location: Left Arm, Patient Position: Sitting, Cuff Size: Normal)   Pulse (!) 55   Temp 97.8 F (36.6 C) (Temporal)   Ht 5' 5.5 (1.664 m)   Wt 158 lb 11.2 oz (72 kg)   BMI 26.01 kg/m  Physical Exam Constitutional:      Appearance: Normal appearance.  HENT:     Head: Normocephalic  and atraumatic.  Eyes:     Extraocular Movements: Extraocular movements intact.     Conjunctiva/sclera: Conjunctivae normal.  Cardiovascular:     Rate and Rhythm: Normal rate and regular rhythm.  Pulmonary:     Effort: Pulmonary effort is normal.     Breath sounds: Normal breath sounds.  Abdominal:     General: Bowel sounds are normal.     Palpations: Abdomen is soft.  Musculoskeletal:        General: No swelling. Normal range of motion.     Cervical back: Normal range of motion and neck supple.  Skin:    General: Skin is warm and dry.     Coloration: Skin is not jaundiced.  Neurological:     General: No focal deficit present.     Mental Status: She is alert and oriented to person, place, and time.  Psychiatric:        Mood and Affect: Mood normal.        Behavior: Behavior normal.      Assessment: *Personal history of colon polyps   Plan: Will schedule for surveillance colonoscopy.The risks including infection, bleed, or perforation as well as benefits, limitations, alternatives and imponderables have been reviewed with the patient. Questions have been answered. All parties agreeable.  Thank you Dr. Antonetta for the kind referral. 11/09/2023 1:36 PM   Disclaimer: This note was dictated with voice recognition software. Similar sounding words can inadvertently be transcribed and may not be corrected upon review.

## 2023-11-18 DIAGNOSIS — H524 Presbyopia: Secondary | ICD-10-CM | POA: Diagnosis not present

## 2023-11-18 DIAGNOSIS — H2513 Age-related nuclear cataract, bilateral: Secondary | ICD-10-CM | POA: Diagnosis not present

## 2023-11-18 DIAGNOSIS — H401131 Primary open-angle glaucoma, bilateral, mild stage: Secondary | ICD-10-CM | POA: Diagnosis not present

## 2023-12-08 ENCOUNTER — Telehealth: Payer: Self-pay | Admitting: *Deleted

## 2023-12-08 NOTE — Telephone Encounter (Signed)
 Per Crystal in endo She wishes to be rescheduled. Can you call her to reschedule, preferably late morning or early afternoon per patient request?

## 2023-12-12 ENCOUNTER — Telehealth (INDEPENDENT_AMBULATORY_CARE_PROVIDER_SITE_OTHER): Payer: Self-pay | Admitting: Internal Medicine

## 2023-12-12 NOTE — Telephone Encounter (Addendum)
 Spoke with pt. Rescheduled to 9/4. Aware will send new instructions to my chart. She already has prep at home.

## 2023-12-12 NOTE — Telephone Encounter (Signed)
 Patient came to front desk asking to reschedule her procedure. She can be reached at [phone]

## 2023-12-13 ENCOUNTER — Ambulatory Visit (HOSPITAL_COMMUNITY): Admission: RE | Admit: 2023-12-13 | Source: Home / Self Care | Admitting: Internal Medicine

## 2023-12-13 ENCOUNTER — Encounter (HOSPITAL_COMMUNITY): Admission: RE | Payer: Self-pay | Source: Home / Self Care

## 2023-12-13 SURGERY — COLONOSCOPY
Anesthesia: Choice

## 2023-12-29 ENCOUNTER — Ambulatory Visit (HOSPITAL_COMMUNITY): Admitting: Anesthesiology

## 2023-12-29 ENCOUNTER — Other Ambulatory Visit: Payer: Self-pay

## 2023-12-29 ENCOUNTER — Encounter (HOSPITAL_COMMUNITY)
Admission: RE | Disposition: A | Discharge status: Home / Self Care | Payer: Self-pay | Source: Home / Self Care | Attending: Internal Medicine

## 2023-12-29 ENCOUNTER — Ambulatory Visit (HOSPITAL_COMMUNITY)
Admission: RE | Admit: 2023-12-29 | Discharge: 2023-12-29 | Disposition: A | Discharge status: Home / Self Care | Attending: Internal Medicine | Admitting: Internal Medicine

## 2023-12-29 ENCOUNTER — Encounter (HOSPITAL_COMMUNITY): Payer: Self-pay | Admitting: Internal Medicine

## 2023-12-29 DIAGNOSIS — K648 Other hemorrhoids: Secondary | ICD-10-CM | POA: Diagnosis not present

## 2023-12-29 DIAGNOSIS — E669 Obesity, unspecified: Secondary | ICD-10-CM | POA: Diagnosis not present

## 2023-12-29 DIAGNOSIS — K5730 Diverticulosis of large intestine without perforation or abscess without bleeding: Secondary | ICD-10-CM | POA: Insufficient documentation

## 2023-12-29 DIAGNOSIS — Z1211 Encounter for screening for malignant neoplasm of colon: Secondary | ICD-10-CM

## 2023-12-29 DIAGNOSIS — R7303 Prediabetes: Secondary | ICD-10-CM

## 2023-12-29 DIAGNOSIS — Z860101 Personal history of adenomatous and serrated colon polyps: Secondary | ICD-10-CM | POA: Insufficient documentation

## 2023-12-29 DIAGNOSIS — Z6825 Body mass index (BMI) 25.0-25.9, adult: Secondary | ICD-10-CM | POA: Diagnosis not present

## 2023-12-29 DIAGNOSIS — Z860100 Personal history of colon polyps, unspecified: Secondary | ICD-10-CM | POA: Diagnosis not present

## 2023-12-29 HISTORY — PX: COLONOSCOPY: SHX5424

## 2023-12-29 SURGERY — COLONOSCOPY
Anesthesia: General

## 2023-12-29 MED ORDER — PROPOFOL 500 MG/50ML IV EMUL
INTRAVENOUS | Status: DC | PRN
  Administered 2023-12-29: 70 mg via INTRAVENOUS
  Administered 2023-12-29: 125 ug/kg/min via INTRAVENOUS
  Administered 2023-12-29: 30 mg via INTRAVENOUS

## 2023-12-29 MED ORDER — LACTATED RINGERS IV SOLN: INTRAVENOUS | Status: DC

## 2023-12-29 MED ORDER — LIDOCAINE 2% (20 MG/ML) 5 ML SYRINGE
INTRAMUSCULAR | Status: DC | PRN
Start: 2023-12-29 — End: 2023-12-29
  Administered 2023-12-29: 60 mg via INTRAVENOUS

## 2023-12-29 NOTE — Discharge Instructions (Signed)

## 2023-12-29 NOTE — Transfer of Care (Addendum)
 Immediate Anesthesia Transfer of Care Note  Patient: Adrienne [name]  Procedure(s) Performed: COLONOSCOPY  Patient Location: Endoscopy Unit  Anesthesia Type:General  Level of Consciousness: drowsy and patient cooperative  Airway & Oxygen Therapy: Patient Spontanous Breathing  Post-op Assessment: Report given to RN and Post -op Vital signs reviewed and stable  Post vital signs: Reviewed and stable  Last Vitals:  Vitals Value Taken Time  BP 100/54 12/29/23   1331  Temp 36.4 12/29/23   1331  Pulse 59 12/29/23   1331  Resp 20 12/29/23   1331  SpO2 100% 12/29/23   1331    Last Pain:  Vitals:   12/29/23 1311  TempSrc:   PainSc: 0-No pain      Patients Stated Pain Goal: 7 (12/29/23 1243)  Complications: No notable events documented.

## 2023-12-29 NOTE — H&P (Signed)
 Primary Care Physician:  Antonetta Rollene BRAVO, MD Primary Gastroenterologist:  Dr. Cindie  Pre-Procedure History & Physical: HPI:  Adrienne [name] is a 78 y.o. female is here for a colonoscopy to be performed for surveillance purposes, personal history of adenomatous colon polyps  Past Medical History:  Diagnosis Date   Allergy    Arthritis    low back   Cataract    Diverticulosis 05/2009   Dyspnea    mostly with exertion   Encounter for immunization 04/17/2023   GERD (gastroesophageal reflux disease)    Glaucoma    Hyperlipidemia    Hypertension    Large breasts 09/12/2022   Pre-diabetes     Past Surgical History:  Procedure Laterality Date   ABDOMINAL HYSTERECTOMY  04/26/1988   for fibroids ovaries remain    BREAST REDUCTION SURGERY Bilateral 05/11/2023   Procedure: BREAST REDUCTION WITH LIPOSUCTION;  Surgeon: Lowery Estefana RAMAN, DO;  Location: [facility];  Service: Plastics;  Laterality: Bilateral;   CHOLECYSTECTOMY  04/26/1978   COLONOSCOPY  05/27/2009   rare diverticula, small internal hemorrhoids. Next colonoscopy in 5-10 yrs.   COLONOSCOPY N/A 10/23/2018   Procedure: COLONOSCOPY;  Surgeon: Harvey Margo CROME, MD;  Location: [facility];  Service: Endoscopy;  Laterality: N/A;  2:00pm   JOINT REPLACEMENT N/A    Phreesia 06/02/2020   left nipple inversion bx benign  04/26/1998   POLYPECTOMY  10/23/2018   Procedure: POLYPECTOMY;  Surgeon: Harvey Margo CROME, MD;  Location: [facility];  Service: Endoscopy;;   REDUCTION MAMMAPLASTY Bilateral 05/11/2023   TOTAL HIP ARTHROPLASTY Right 05/16/2020   Procedure: TOTAL HIP ARTHROPLASTY;  Surgeon: Shari Sieving, MD;  Location: [facility];  Service: Orthopedics;  Laterality: Right;    Prior to Admission medications   Medication Sig Start Date End Date Taking? Authorizing Provider  ondansetron  (ZOFRAN ) 4 MG tablet Take 1 tablet (4 mg total) by mouth every 8 (eight) hours as needed for nausea or vomiting. 04/13/23   Yes Scheeler, Donnice PARAS, PA-C  Propylene Glycol (SYSTANE BALANCE OP) Apply to eye. Patient taking differently: Apply to eye daily at 6 (six) AM.   Yes [provider]  acetaminophen  (TYLENOL ) 650 MG CR tablet Take 2 tablets (1,300 mg total) by mouth daily as needed for pain. 05/16/20   Chadwell, Joshua, PA-C  amLODipine  (NORVASC ) 2.5 MG tablet TAKE 1 TABLET (2.5 MG TOTAL) BY MOUTH DAILY. 08/26/23   Antonetta Rollene BRAVO, MD  Biotin 10 MG CAPS Take 10 mg by mouth every evening.    [provider]  dorzolamide-timolol (COSOPT) 2-0.5 % ophthalmic solution 1 drop 2 (two) times daily. 03/28/23   [provider]  Liniments (BLUE-EMU SUPER STRENGTH EX) Apply 1 application. topically 4 (four) times daily as needed (pain.).    [provider]  loratadine (CLARITIN) 10 MG tablet Take 10 mg by mouth daily as needed for allergies.    [provider]  Menthol, Topical Analgesic, (ICE BLUE EX) Apply 1 application. topically 4 (four) times daily as needed (pain.).    [provider]  Menthol-Methyl Salicylate (MUSCLE RUB) 10-15 % CREA Apply 1 application. topically as needed for muscle pain.    [provider]  Multiple Minerals-Vitamins (CAL MAG ZINC +D3 PO) Take 1 tablet by mouth every evening.    [provider]  Multiple Vitamin (MULTIVITAMIN WITH MINERALS) TABS tablet Take 1 tablet by mouth every evening. Women's Multivitamin 50+    [provider]  triamterene -hydrochlorothiazide  (MAXZIDE-25) 37.5-25 MG tablet TAKE  1 TABLET EVERY DAY 06/20/23   Antonetta Rollene BRAVO, MD    Allergies as of 12/12/2023 - Review Complete 11/09/2023  Allergen Reaction Noted   Valsartan  Shortness Of Breath 03/20/2013   Ace inhibitors Cough 03/20/2013    Family History  Problem Relation Age of Onset   Kidney failure Father    Arthritis Father    Cancer Father    Heart disease Sister    Hypertension Sister    Arthritis Sister    Breast cancer Sister     Diabetes Sister    Arthritis Sister    Lung cancer Brother        was a smoker   Arthritis Brother    Diabetes Brother    Heart disease Brother    Hypertension Brother    Arthritis Mother    Heart disease Mother    Hypertension Mother    Prostate cancer Brother    Colon cancer Neg Hx     Social History   Socioeconomic History   Marital status: Single    Spouse name: Not on file   Number of children: 0   Years of education: Not on file   Highest education level: Bachelor's degree (e.g., BA, AB, BS)  Occupational History   Occupation: retired    Associate Professor: RETIRED  Tobacco Use   Smoking status: Never   Smokeless tobacco: Never  Vaping Use   Vaping status: Never Used  Substance and Sexual Activity   Alcohol use: Not Currently    Comment: rarely    Drug use: No   Sexual activity: Not Currently    Birth control/protection: Surgical    Comment: hyst  Other Topics Concern   Not on file  Social History Narrative   Not on file   Social Drivers of Health   Financial Resource Strain: Low Risk  (10/13/2023)   Overall Financial Resource Strain (CARDIA)    Difficulty of Paying Living Expenses: Not hard at all  Food Insecurity: No Food Insecurity (10/13/2023)   Hunger Vital Sign    Worried About Running Out of Food in the Last Year: Never true    Ran Out of Food in the Last Year: Never true  Transportation Needs: No Transportation Needs (10/13/2023)   PRAPARE - Administrator, Civil Service (Medical): No    Lack of Transportation (Non-Medical): No  Physical Activity: Sufficiently Active (10/13/2023)   Exercise Vital Sign    Days of Exercise per Week: 3 days    Minutes of Exercise per Session: 60 min  Stress: No Stress Concern Present (10/13/2023)   [employer] of Occupational Health - Occupational Stress Questionnaire    Feeling of Stress: Only a little  Social Connections: Moderately Integrated (10/13/2023)   Social Connection and Isolation Panel     Frequency of Communication with Friends and Family: More than three times a week    Frequency of Social Gatherings with Friends and Family: More than three times a week    Attends Religious Services: More than 4 times per year    Active Member of [employer] or Organizations: Yes    Attends Banker Meetings: More than 4 times per year    Marital Status: Never married  Intimate Partner Violence: Not At Risk (09/12/2023)   Humiliation, Afraid, Rape, and Kick questionnaire    Fear of Current or Ex-Partner: No    Emotionally Abused: No    Physically Abused: No    Sexually Abused: No    Review of  Systems: See HPI, otherwise negative ROS  Physical Exam: Vital signs in last 24 hours:     General:   Alert,  Well-developed, well-nourished, pleasant and cooperative in NAD Head:  Normocephalic and atraumatic. Eyes:  Sclera clear, no icterus.   Conjunctiva pink. Ears:  Normal auditory acuity. Nose:  No deformity, discharge,  or lesions. Msk:  Symmetrical without gross deformities. Normal posture. Extremities:  Without clubbing or edema. Neurologic:  Alert and  oriented x4;  grossly normal neurologically. Skin:  Intact without significant lesions or rashes. Psych:  Alert and cooperative. Normal mood and affect.  Impression/Plan: Adrienne [name] is here for a colonoscopy to be performed for surveillance purposes, personal history of adenomatous colon polyps   The risks of the procedure including infection, bleed, or perforation as well as benefits, limitations, alternatives and imponderables have been reviewed with the patient. Questions have been answered. All parties agreeable.

## 2023-12-29 NOTE — Anesthesia Preprocedure Evaluation (Addendum)
 Anesthesia Evaluation  Patient identified by MRN, date of birth, ID band Patient awake    Reviewed: Allergy & Precautions, NPO status , Patient's Chart, lab work & pertinent test results  Airway Mallampati: II  TM Distance: >3 FB Neck ROM: Full    Dental no notable dental hx. (+) Teeth Intact, Dental Advisory Given   Pulmonary shortness of breath   Pulmonary exam normal        Cardiovascular hypertension, Pt. on medications Normal cardiovascular exam     Neuro/Psych  Headaches glaucoma  negative psych ROS   GI/Hepatic Neg liver ROS,GERD  ,,  Endo/Other  prediabetes  Renal/GU negative Renal ROS     Musculoskeletal  (+) Arthritis ,    Abdominal  (+) + obese  Peds  Hematology negative hematology ROS (+)   Anesthesia Other Findings Macromastia  Reproductive/Obstetrics                              Anesthesia Physical Anesthesia Plan  ASA: 2  Anesthesia Plan: General   Post-op Pain Management: Minimal or no pain anticipated   Induction: Intravenous  PONV Risk Score and Plan:   Airway Management Planned: Nasal Cannula and Natural Airway  Additional Equipment: None  Intra-op Plan:   Post-operative Plan:   Informed Consent: I have reviewed the patients History and Physical, chart, labs and discussed the procedure including the risks, benefits and alternatives for the proposed anesthesia with the patient or authorized representative who has indicated his/her understanding and acceptance.     Dental advisory given  Plan Discussed with: CRNA  Anesthesia Plan Comments:         Anesthesia Quick Evaluation

## 2023-12-29 NOTE — Op Note (Signed)
 [facility] Patient Name: Adrienne [name] Procedure Date: 12/29/2023 1:04 PM MRN: [id number] Date of Birth: [birth year] Attending MD: Carlin POUR. Cindie , [place], [id number] CSN: [id number] Age: 78 Admit Type: Outpatient Procedure:                Colonoscopy Indications:              High risk colon cancer surveillance: Personal                            history of colonic polyps Providers:                Carlin POUR. Cindie, DO, Tammy Vaught, RN, Devere Lodge Referring MD:              Medicines:                See the Anesthesia note for documentation of the                            administered medications Complications:            No immediate complications. Estimated Blood Loss:     Estimated blood loss: none. Procedure:                Pre-Anesthesia Assessment:                           - The anesthesia plan was to use monitored                            anesthesia care (MAC).                           After obtaining informed consent, the colonoscope                            was passed under direct vision. Throughout the                            procedure, the patient's blood pressure, pulse, and                            oxygen saturations were monitored continuously. The                            PCF-HQ190L ([id number]) Peds Colon was introduced                            through the anus and advanced to the the cecum,                            identified by appendiceal orifice and ileocecal                            valve. The colonoscopy was performed without                            difficulty. The patient  tolerated the procedure                            well. The quality of the bowel preparation was                            evaluated using the BBPS [facility] Bowel Preparation                            Scale) with scores of: Right Colon = 3, Transverse                            Colon = 3 and Left Colon = 3 (entire mucosa seen                            well with  no residual staining, small fragments of                            stool or opaque liquid). The total BBPS score                            equals 9. Scope In: 1:16:27 PM Scope Out: 1:26:50 PM Scope Withdrawal Time: 0 hours 6 minutes 43 seconds  Total Procedure Duration: 0 hours 10 minutes 23 seconds  Findings:      Non-bleeding internal hemorrhoids were found.      Multiple large-mouthed and small-mouthed diverticula were found in the       transverse colon and ascending colon.      The exam was otherwise without abnormality. Impression:               - Non-bleeding internal hemorrhoids.                           - Diverticulosis in the transverse colon and in the                            ascending colon.                           - The examination was otherwise normal.                           - No specimens collected. Moderate Sedation:      Per Anesthesia Care Recommendation:           - Patient has a contact number available for                            emergencies. The signs and symptoms of potential                            delayed complications were discussed with the                            patient. Return to normal activities tomorrow.  Written discharge instructions were provided to the                            patient.                           - Resume previous diet.                           - Continue present medications.                           - Repeat colonoscopy in 10 years for screening                            purposes.                           - Return to GI clinic PRN. Procedure Code(s):        --- Professional ---                           H9894, Colorectal cancer screening; colonoscopy on                            individual at high risk Diagnosis Code(s):        --- Professional ---                           Z86.010, Personal history of colonic polyps                           K64.8, Other hemorrhoids                            K57.30, Diverticulosis of large intestine without                            perforation or abscess without bleeding CPT copyright 2022 American Medical Association. All rights reserved. The codes documented in this report are preliminary and upon coder review may  be revised to meet current compliance requirements. Carlin POUR. Cindie, DO Carlin POUR. Cindie, DO 12/29/2023 1:29:29 PM This report has been signed electronically. Number of Addenda: 0

## 2023-12-29 NOTE — Anesthesia Postprocedure Evaluation (Signed)
 Anesthesia Post Note  Patient: Adrienne [name]  Procedure(s) Performed: COLONOSCOPY  Patient location during evaluation: Endoscopy Anesthesia Type: General Level of consciousness: awake and alert Pain management: pain level controlled Vital Signs Assessment: post-procedure vital signs reviewed and stable Respiratory status: spontaneous breathing, nonlabored ventilation and respiratory function stable Cardiovascular status: stable Anesthetic complications: no   There were no known notable events for this encounter.   Last Vitals:  Vitals:   12/29/23 1243 12/29/23 1331  BP: 131/71 (!) 100/54  Pulse: (!) 56 (!) 59  Resp: 10 20  Temp: 36.5 C (!) 36.4 C  SpO2: 100% 100%    Last Pain:  Vitals:   12/29/23 1331  TempSrc: Oral  PainSc: 0-No pain                 Gaylynn Seiple L Thad Osoria

## 2023-12-29 NOTE — Anesthesia Procedure Notes (Signed)
 Date/Time: 12/29/2023 1:12 PM  Performed by: Para Jerelene CROME, CRNAOxygen Delivery Method: Nasal cannula

## 2024-01-02 ENCOUNTER — Encounter (HOSPITAL_COMMUNITY): Payer: Self-pay | Admitting: Internal Medicine

## 2024-03-09 DIAGNOSIS — H401131 Primary open-angle glaucoma, bilateral, mild stage: Secondary | ICD-10-CM | POA: Diagnosis not present

## 2024-03-09 DIAGNOSIS — H2513 Age-related nuclear cataract, bilateral: Secondary | ICD-10-CM | POA: Diagnosis not present

## 2024-03-26 DIAGNOSIS — I10 Essential (primary) hypertension: Secondary | ICD-10-CM | POA: Diagnosis not present

## 2024-03-26 DIAGNOSIS — R7303 Prediabetes: Secondary | ICD-10-CM | POA: Diagnosis not present

## 2024-03-26 DIAGNOSIS — E785 Hyperlipidemia, unspecified: Secondary | ICD-10-CM | POA: Diagnosis not present

## 2024-03-27 LAB — LIPID PANEL
Chol/HDL Ratio: 2.6 ratio (ref 0.0–4.4)
Cholesterol, Total: 250 mg/dL — ABNORMAL HIGH (ref 100–199)
HDL: 95 mg/dL (ref >39)
LDL Chol Calc (NIH): 140 mg/dL — ABNORMAL HIGH (ref 0–99)
Triglycerides: 88 mg/dL (ref 0–149)
VLDL Cholesterol Cal: 15 mg/dL (ref 5–40)

## 2024-03-27 LAB — CMP14+EGFR
ALT: 27 IU/L (ref 0–32)
AST: 31 IU/L (ref 0–40)
Albumin: 4 g/dL (ref 3.8–4.8)
Alkaline Phosphatase: 115 IU/L (ref 49–135)
BUN/Creatinine Ratio: 18 (ref 12–28)
BUN: 16 mg/dL (ref 8–27)
Bilirubin Total: 0.5 mg/dL (ref 0.0–1.2)
CO2: 22 mmol/L (ref 20–29)
Calcium: 9.8 mg/dL (ref 8.7–10.3)
Chloride: 100 mmol/L (ref 96–106)
Creatinine, Ser: 0.87 mg/dL (ref 0.57–1.00)
Globulin, Total: 3.3 g/dL (ref 1.5–4.5)
Glucose: 69 mg/dL — ABNORMAL LOW (ref 70–99)
Potassium: 4.5 mmol/L (ref 3.5–5.2)
Sodium: 141 mmol/L (ref 134–144)
Total Protein: 7.3 g/dL (ref 6.0–8.5)
eGFR: 69 mL/min/1.73 (ref >59)

## 2024-03-27 LAB — HEMOGLOBIN A1C
Est. average glucose Bld gHb Est-mCnc: 123 mg/dL
Hgb A1c MFr Bld: 5.9 % — ABNORMAL HIGH (ref 4.8–5.6)

## 2024-03-28 ENCOUNTER — Ambulatory Visit: Admitting: Family Medicine

## 2024-03-28 ENCOUNTER — Encounter: Payer: Self-pay | Admitting: Family Medicine

## 2024-03-28 VITALS — BP 114/73 | HR 63 | Ht 65.0 in | Wt 154.0 lb

## 2024-03-28 DIAGNOSIS — G8929 Other chronic pain: Secondary | ICD-10-CM

## 2024-03-28 DIAGNOSIS — I10 Essential (primary) hypertension: Secondary | ICD-10-CM | POA: Diagnosis not present

## 2024-03-28 DIAGNOSIS — Z23 Encounter for immunization: Secondary | ICD-10-CM

## 2024-03-28 DIAGNOSIS — E559 Vitamin D deficiency, unspecified: Secondary | ICD-10-CM

## 2024-03-28 DIAGNOSIS — E785 Hyperlipidemia, unspecified: Secondary | ICD-10-CM

## 2024-03-28 DIAGNOSIS — M546 Pain in thoracic spine: Secondary | ICD-10-CM | POA: Diagnosis not present

## 2024-03-28 DIAGNOSIS — R7303 Prediabetes: Secondary | ICD-10-CM | POA: Diagnosis not present

## 2024-03-28 DIAGNOSIS — I872 Venous insufficiency (chronic) (peripheral): Secondary | ICD-10-CM | POA: Diagnosis not present

## 2024-03-28 DIAGNOSIS — E663 Overweight: Secondary | ICD-10-CM

## 2024-03-28 NOTE — Patient Instructions (Addendum)
 F/U in 6 months  Congrats on improved blood sugar, please work on fried and fatty foods , cholesterol has increased    FIT test once yearly for colon screening  Fasting cBC, lipid, cmp and EGFR , hBA1C TSH , and vit D  Please reduce fried and fatty foods     Flu vaccine today  Covid vaccine next 1 to 2 weeks at your pharmacy    It is important that you exercise regularly at least 30 minutes 5 times a week. If you develop chest pain, have severe difficulty breathing, or feel very tired, stop exercising immediately and seek medical attention   Think about what you will eat, plan ahead. Choose  clean, green, fresh or frozen over canned, processed or packaged foods which are more sugary, salty and fatty. 70 to 75% of food eaten should be vegetables and fruit. Three meals at set times with snacks allowed between meals, but they must be fruit or vegetables. Aim to eat over a 12 hour period , example 7 am to 7 pm, and STOP after  your last meal of the day. Drink water ,generally about 64 ounces per day, no other drink is as healthy. Fruit juice is best enjoyed in a healthy way, by EATING the fruit.  Thanks for choosing [facility], we consider it a privelige to serve you.

## 2024-04-01 ENCOUNTER — Encounter: Payer: Self-pay | Admitting: Family Medicine

## 2024-04-01 DIAGNOSIS — Z23 Encounter for immunization: Secondary | ICD-10-CM | POA: Insufficient documentation

## 2024-04-01 NOTE — Assessment & Plan Note (Signed)
 Patient educated about the importance of limiting  Carbohydrate intake , the need to commit to daily physical activity for a minimum of 30 minutes , and to commit weight loss. The fact that changes in all these areas will reduce or eliminate all together the development of diabetes is stressed.   Mproved which is good, she is applauded on this    Latest Ref Rng & Units 03/26/2024    1:30 PM 10/12/2023   10:39 AM 04/12/2023    1:37 PM 09/03/2022   10:56 AM 03/08/2022   10:14 AM  Diabetic Labs  HbA1c 4.8 - 5.6 % 5.9  6.0  5.8  5.8  5.9   Chol 100 - 199 mg/dL 749  764  721  751  751   HDL >39 mg/dL 95  89  888  97  90   Calc LDL 0 - 99 mg/dL 859  874  844  874  853   Triglycerides 0 - 149 mg/dL 88  874  75  848  70   Creatinine 0.57 - 1.00 mg/dL 9.12  9.22  9.11  9.17  0.84       03/28/2024    1:29 PM 12/29/2023    1:31 PM 12/29/2023   12:43 PM 11/09/2023    1:33 PM 10/14/2023    1:38 PM 09/12/2023    1:40 PM 06/03/2023    1:14 PM  BP/Weight  Systolic BP 114 100 131 123 114 -- 142  Diastolic BP 73 54 71 75 71 -- 75  Wt. (Lbs) 154.04  153 158.7 158.04 158   BMI 25.63 kg/m2  25.46 kg/m2 26.01 kg/m2 25.9 kg/m2 26.29 kg/m2        No data to display

## 2024-04-01 NOTE — Assessment & Plan Note (Signed)
 Encouraged to wear compression hose daily

## 2024-04-01 NOTE — Assessment & Plan Note (Signed)
  Patient re-educated about  the importance of commitment to a  minimum of 150 minutes of exercise per week as able.  The importance of healthy food choices with portion control discussed, as well as eating regularly and within a 12 hour window most days. The need to choose clean , green food 50 to 75% of the time is discussed, as well as to make water  the primary drink and set a goal of 64 ounces water  daily.       03/28/2024    1:29 PM 12/29/2023   12:43 PM 11/09/2023    1:33 PM  Weight /BMI  Weight 154 lb 0.6 oz 153 lb 158 lb 11.2 oz  Height 5' 5 (1.651 m) 5' 5 (1.651 m) 5' 5.5 (1.664 m)  BMI 25.63 kg/m2 25.46 kg/m2 26.01 kg/m2    Unchanged

## 2024-04-01 NOTE — Assessment & Plan Note (Signed)
 Controlled, no change in medication DASH diet and commitment to daily physical activity for a minimum of 30 minutes discussed and encouraged, as a part of hypertension management. The importance of attaining a healthy weight is also discussed.     03/28/2024    1:29 PM 12/29/2023    1:31 PM 12/29/2023   12:43 PM 11/09/2023    1:33 PM 10/14/2023    1:38 PM 09/12/2023    1:40 PM 06/03/2023    1:14 PM  BP/Weight  Systolic BP 114 100 131 123 114 -- 142  Diastolic BP 73 54 71 75 71 -- 75  Wt. (Lbs) 154.04  153 158.7 158.04 158   BMI 25.63 kg/m2  25.46 kg/m2 26.01 kg/m2 25.9 kg/m2 26.29 kg/m2

## 2024-04-01 NOTE — Assessment & Plan Note (Signed)
 Hyperlipidemia:Low fat diet discussed and encouraged.   Lipid Panel  Lab Results  Component Value Date   CHOL 250 (H) 03/26/2024   HDL 95 03/26/2024   LDLCALC 140 (H) 03/26/2024   TRIG 88 03/26/2024   CHOLHDL 2.6 03/26/2024     Needs to reduce fried and fatty foods, deteriorated

## 2024-04-01 NOTE — Assessment & Plan Note (Signed)
Improved following breast reduction surgery

## 2024-04-01 NOTE — Assessment & Plan Note (Signed)
 Updated lab needed at/ before next visit.

## 2024-04-01 NOTE — Assessment & Plan Note (Signed)
 After obtaining informed consent, the influenza vaccine is  administered , with no adverse effect noted at the time of administration.

## 2024-04-01 NOTE — Progress Notes (Signed)
 Adrienne [name]     MRN: [id number]      DOB: 09-18-1945  Chief Complaint  Patient presents with   Medical Management of Chronic Issues    Follow up    HPI Adrienne [name] is here for follow up and re-evaluation of chronic medical conditions, medication management and review of any available recent lab and radiology data.  Preventive health is updated, specifically  Cancer screening and Immunization.   Questions or concerns regarding consultations or procedures which the PT has had in the interim are  addressed. The PT denies any adverse reactions to current medications since the last visit.  There are no new concerns.  There are no specific complaints   ROS Denies recent fever or chills. Denies sinus pressure, nasal congestion, ear pain or sore throat. Denies chest congestion, productive cough or wheezing. Denies chest pains, palpitations and leg swelling Denies abdominal pain, nausea, vomiting,diarrhea or constipation.   Denies dysuria, frequency, hesitancy or incontinence. Denies joint pain, swelling and limitation in mobility. Denies headaches, seizures, numbness, or tingling. Denies depression, anxiety or insomnia. Denies skin break down or rash.   PE  BP 114/73   Pulse 63   Ht 5' 5 (1.651 m)   Wt 154 lb 0.6 oz (69.9 kg)   SpO2 96%   BMI 25.63 kg/m   Patient alert and oriented and in no cardiopulmonary distress.  HEENT: No facial asymmetry, EOMI,     Neck supple .  Chest: Clear to auscultation bilaterally.  CVS: S1, S2 no murmurs, no S3.Regular rate.  ABD: Soft non tender.   Ext: No edema  MS: Adequate ROM spine, shoulders, hips and knees.  Skin: Intact, no ulcerations or rash noted.  Psych: Good eye contact, normal affect. Memory intact not anxious or depressed appearing.  CNS: CN 2-12 intact, power,  normal throughout.no focal deficits noted.   Assessment & Plan  Essential hypertension Controlled, no change in medication DASH diet and commitment to  daily physical activity for a minimum of 30 minutes discussed and encouraged, as a part of hypertension management. The importance of attaining a healthy weight is also discussed.     03/28/2024    1:29 PM 12/29/2023    1:31 PM 12/29/2023   12:43 PM 11/09/2023    1:33 PM 10/14/2023    1:38 PM 09/12/2023    1:40 PM 06/03/2023    1:14 PM  BP/Weight  Systolic BP 114 100 131 123 114 -- 142  Diastolic BP 73 54 71 75 71 -- 75  Wt. (Lbs) 154.04  153 158.7 158.04 158   BMI 25.63 kg/m2  25.46 kg/m2 26.01 kg/m2 25.9 kg/m2 26.29 kg/m2        Hyperlipidemia LDL goal <100 Hyperlipidemia:Low fat diet discussed and encouraged.   Lipid Panel  Lab Results  Component Value Date   CHOL 250 (H) 03/26/2024   HDL 95 03/26/2024   LDLCALC 140 (H) 03/26/2024   TRIG 88 03/26/2024   CHOLHDL 2.6 03/26/2024     Needs to reduce fried and fatty foods, deteriorated  Prediabetes Patient educated about the importance of limiting  Carbohydrate intake , the need to commit to daily physical activity for a minimum of 30 minutes , and to commit weight loss. The fact that changes in all these areas will reduce or eliminate all together the development of diabetes is stressed.   Mproved which is good, she is applauded on this    Latest Ref Rng & Units 03/26/2024    1:30  PM 10/12/2023   10:39 AM 04/12/2023    1:37 PM 09/03/2022   10:56 AM 03/08/2022   10:14 AM  Diabetic Labs  HbA1c 4.8 - 5.6 % 5.9  6.0  5.8  5.8  5.9   Chol 100 - 199 mg/dL 749  764  721  751  751   HDL >39 mg/dL 95  89  888  97  90   Calc LDL 0 - 99 mg/dL 859  874  844  874  853   Triglycerides 0 - 149 mg/dL 88  874  75  848  70   Creatinine 0.57 - 1.00 mg/dL 9.12  9.22  9.11  9.17  0.84       03/28/2024    1:29 PM 12/29/2023    1:31 PM 12/29/2023   12:43 PM 11/09/2023    1:33 PM 10/14/2023    1:38 PM 09/12/2023    1:40 PM 06/03/2023    1:14 PM  BP/Weight  Systolic BP 114 100 131 123 114 -- 142  Diastolic BP 73 54 71 75 71 -- 75  Wt. (Lbs)  154.04  153 158.7 158.04 158   BMI 25.63 kg/m2  25.46 kg/m2 26.01 kg/m2 25.9 kg/m2 26.29 kg/m2        No data to display            Vitamin D  deficiency Updated lab needed at/ before next visit.   Venous insufficiency of both lower extremities Encouraged to wear compression hose daily  Thoracic back pain Improved following breast reduction surgery  Overweight (BMI 25.0-29.9)  Patient re-educated about  the importance of commitment to a  minimum of 150 minutes of exercise per week as able.  The importance of healthy food choices with portion control discussed, as well as eating regularly and within a 12 hour window most days. The need to choose clean , green food 50 to 75% of the time is discussed, as well as to make water  the primary drink and set a goal of 64 ounces water  daily.       03/28/2024    1:29 PM 12/29/2023   12:43 PM 11/09/2023    1:33 PM  Weight /BMI  Weight 154 lb 0.6 oz 153 lb 158 lb 11.2 oz  Height 5' 5 (1.651 m) 5' 5 (1.651 m) 5' 5.5 (1.664 m)  BMI 25.63 kg/m2 25.46 kg/m2 26.01 kg/m2    Unchanged  Immunization due After obtaining informed consent, the influenza  vaccine is  administered , with no adverse effect noted at the time of administration.

## 2024-04-07 ENCOUNTER — Other Ambulatory Visit: Payer: Self-pay | Admitting: Family Medicine

## 2024-09-12 ENCOUNTER — Ambulatory Visit

## 2024-09-26 ENCOUNTER — Ambulatory Visit: Admitting: Family Medicine
